# Patient Record
Sex: Male | Born: 1950 | Race: White | Hispanic: No | Marital: Married | Smoking: Former smoker
Health system: Southern US, Community
[De-identification: ages and names within clinical notes are randomized; demographics above are authoritative.]

## PROBLEM LIST (undated history)

## (undated) DIAGNOSIS — M1990 Unspecified osteoarthritis, unspecified site: Secondary | ICD-10-CM

## (undated) DIAGNOSIS — K219 Gastro-esophageal reflux disease without esophagitis: Secondary | ICD-10-CM

## (undated) DIAGNOSIS — R112 Nausea with vomiting, unspecified: Secondary | ICD-10-CM

## (undated) DIAGNOSIS — Z9989 Dependence on other enabling machines and devices: Secondary | ICD-10-CM

## (undated) DIAGNOSIS — G4733 Obstructive sleep apnea (adult) (pediatric): Secondary | ICD-10-CM

## (undated) DIAGNOSIS — F32A Depression, unspecified: Secondary | ICD-10-CM

## (undated) DIAGNOSIS — H409 Unspecified glaucoma: Secondary | ICD-10-CM

## (undated) DIAGNOSIS — F419 Anxiety disorder, unspecified: Secondary | ICD-10-CM

## (undated) DIAGNOSIS — J309 Allergic rhinitis, unspecified: Secondary | ICD-10-CM

## (undated) DIAGNOSIS — E785 Hyperlipidemia, unspecified: Secondary | ICD-10-CM

## (undated) DIAGNOSIS — J189 Pneumonia, unspecified organism: Secondary | ICD-10-CM

## (undated) DIAGNOSIS — I4819 Other persistent atrial fibrillation: Secondary | ICD-10-CM

## (undated) DIAGNOSIS — Z95810 Presence of automatic (implantable) cardiac defibrillator: Secondary | ICD-10-CM

## (undated) DIAGNOSIS — Z87442 Personal history of urinary calculi: Secondary | ICD-10-CM

## (undated) DIAGNOSIS — Z8489 Family history of other specified conditions: Secondary | ICD-10-CM

## (undated) DIAGNOSIS — T4145XA Adverse effect of unspecified anesthetic, initial encounter: Secondary | ICD-10-CM

## (undated) DIAGNOSIS — I119 Hypertensive heart disease without heart failure: Secondary | ICD-10-CM

## (undated) DIAGNOSIS — I509 Heart failure, unspecified: Secondary | ICD-10-CM

## (undated) DIAGNOSIS — K635 Polyp of colon: Secondary | ICD-10-CM

## (undated) DIAGNOSIS — I10 Essential (primary) hypertension: Secondary | ICD-10-CM

## (undated) DIAGNOSIS — Z9889 Other specified postprocedural states: Secondary | ICD-10-CM

## (undated) DIAGNOSIS — T8859XA Other complications of anesthesia, initial encounter: Secondary | ICD-10-CM

## (undated) DIAGNOSIS — N189 Chronic kidney disease, unspecified: Secondary | ICD-10-CM

## (undated) HISTORY — DX: Heart failure, unspecified: I50.9

## (undated) HISTORY — DX: Obstructive sleep apnea (adult) (pediatric): G47.33

## (undated) HISTORY — PX: COLONOSCOPY: SHX174

## (undated) HISTORY — DX: Polyp of colon: K63.5

## (undated) HISTORY — DX: Essential (primary) hypertension: I10

## (undated) HISTORY — DX: Dependence on other enabling machines and devices: Z99.89

## (undated) HISTORY — DX: Other persistent atrial fibrillation: I48.19

## (undated) HISTORY — DX: Unspecified osteoarthritis, unspecified site: M19.90

## (undated) HISTORY — DX: Allergic rhinitis, unspecified: J30.9

## (undated) HISTORY — PX: VASECTOMY: SHX75

## (undated) HISTORY — PX: TONSILLECTOMY: SUR1361

## (undated) HISTORY — DX: Hypertensive heart disease without heart failure: I11.9

## (undated) HISTORY — DX: Hyperlipidemia, unspecified: E78.5

## (undated) HISTORY — DX: Unspecified glaucoma: H40.9

---
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
## 2001-05-01 HISTORY — PX: OTHER SURGICAL HISTORY: SHX169

## 2006-05-01 HISTORY — PX: REPLACEMENT TOTAL KNEE: SUR1224

## 2007-01-15 ENCOUNTER — Inpatient Hospital Stay (HOSPITAL_COMMUNITY): Admission: RE | Admit: 2007-01-15 | Discharge: 2007-01-22 | Payer: Self-pay | Admitting: Orthopaedic Surgery

## 2007-01-18 ENCOUNTER — Ambulatory Visit: Payer: Self-pay | Admitting: Physical Medicine & Rehabilitation

## 2010-09-13 NOTE — Discharge Summary (Signed)
NAMEDONNIS, PHANEUF NO.:  [id number]   MEDICAL RECORD NO.:  [id number]          PATIENT TYPE:  INP   LOCATION:  5029                         FACILITY:  [facility]   PHYSICIAN:  Lubertha Basque. Dalldorf, M.D.DATE OF BIRTH:  1950/09/13   DATE OF ADMISSION:  01/15/2007  DATE OF DISCHARGE:  01/22/2007                               DISCHARGE SUMMARY   ADMITTING DIAGNOSES:  1. 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  2. Hypertension.  3. Hypercholesterolemia.   DISCHARGE DIAGNOSES:  1. 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  2. Hypertension.  3. Hypercholesterolemia.  4. Blood loss anemia   PROCEDURES  Bilateral TKR   BRIEF HISTORY:  Isaiah Jones is a 61 year old white male patient known to  our practice with bilateral knee pain.  He has been having increasing  discomfort in his knees, trouble walking.  X-rays reveal end-stage DJD  of his knees and also a unicompartmental knee replacement on the left  side that has some loosening features.  We have discussed treatment  options with him and he has had a friend that is also a patient of ours  that has had both knees replaced at the same time and this is his wish.  We have talked to him about the risks of anesthesia, infection, DVT and  possible death.   PERTINENT LABORATORY AND X-RAY FINDINGS:  INRs were done serially, as he  was on low-dose Coumadin protocol.  Hemoglobin 9.0, hematocrit 26.2,  WBCs 8.7.  Sodium 136, potassium 4.2, glucose 105, BUN 15 and creatinine  1.10.   Chest x-ray:  No active lung disease.   COURSE IN THE HOSPITAL:  He was admitted postoperatively, was placed on  a variety of p.o. and IM analgesics for pain, IV Ancef a gram q.8 hours  x3 doses was done.  He had physical therapy ordered for out-of-bed  weightbearing as tolerated, knee-high TEDs, incentive spirometry.  He  did have an epidural catheter in for the first two days so  anticoagulation was regulated  with that in mind.  He was started on  Coumadin the day after surgery in the evening and then Lovenox two days  postoperatively; these were regulated by pharmacy.  He was also given IM  and p.o. medications.  He was on a PCA Dilaudid pump and other  antispasmodics, and then his home medicines which will be outlined at  the end of this dictation.  During his hospital stay, his wounds were  noted to be benign, dressings were changed the second day postop and  drains were pulled out.  We did use auto VACs and his blood was  transfused on the first day postoperatively.  He also had followup lab  studies which are inclusive in this chart.  He did have a dip in his  hemoglobin and blood was replaced as necessary; I believe it was two  units of packed RBCs.  He progressed well with physical therapy.  At one  point, he was going a little bit slow and had considered skilled nursing  placement versus rehab unit, but they seemed to turn the  corner and was  able to get up and work with therapy downstairs, and wounds on last  dressing change on his last day of hospitalization were benign.  We had  also arranged for low-dose Coumadin protocol to be regulated by his PCP  doctor in [place] area and also for [place] physical therapy to come  to his house for approximately 30 days and then we were to continue  outpatient.   CONDITION ON DISCHARGE:  Improved.   FOLLOWUP:  1. He will remain on a low sodium, heart-healthy diet.  2. A prescription for Vicodin is given.  3. A prescription for Coumadin dose regulated by pharmacy to be filled      out by pharmacy.  4. Arrangements through [facility] for home physical therapy      was made and through his PCP doctor for regulation of his Coumadin      level.  5. He can change is dressings on his knees daily.  Any sign of      infection including increasing redness, drainage, increasing pain      to call our office at [phone].  We also wanted to see  him back in      the office at the 2 to 2 1/2-week mark from surgery; he will call      that same number for an appointment.   His home medications will remain the same and are as follows:  1. Exforge 5/160 one a day.  2. Crestor 20 mg one a day.  3. Nasonex one spray daily.  4. Vitamin C.  5. Vitamin E.  6. Centrum Silver.  7. Coumadin.  8. Vicodin.      Isaiah Jones, P.A.      Lubertha Basque Jerl Santos, M.D.  Electronically Signed    MC/MEDQ  D:  01/22/2007  T:  01/22/2007  Job:  [id number]

## 2010-09-13 NOTE — Op Note (Signed)
NAMEACEL, NATZKE NO.:  [id number]   MEDICAL RECORD NO.:  [id number]          PATIENT TYPE:  INP   LOCATION:  2899                         FACILITY:  [facility]   PHYSICIAN:  Lubertha Basque. Dalldorf, M.D.DATE OF BIRTH:  [birth year]   DATE OF PROCEDURE:  01/15/2007  DATE OF DISCHARGE:                               OPERATIVE REPORT   PREOPERATIVE DIAGNOSES:  Bilateral knee pain status post prior  procedures.   POSTOPERATIVE DIAGNOSES:  Bilateral knee pain status post prior  procedures.   PROCEDURES:  Bilateral revision knee replacement surgery.   ANESTHESIA:  Epidural and MAC.   ATTENDING SURGEON:  Dr. Jerl Santos.   ASSISTANT:  Lindwood Qua, PA.   INDICATIONS FOR PROCEDURE:  The patient is a 60 year old male with a  long history of bilateral degenerative arthritis of the knees. He has  failed many oral anti-inflammatories as well as injectables. He has had  surgery on both knees.  On the left, he underwent a unicompartmental  knee replacement several years ago which has remained painful. On the  right, he had a high tibial osteotomy which did work for a while but has  become painful at this time.  His pain limits his ability to walk and  stand and work and rest and he is offered revision surgeries.  We  encouraged him to have one done at a time but after much discussion, we  have agreed to go forward with bilateral surgery.  Informed operative  consent was obtained after discussion of the possible complications of,  reaction to anesthesia, infection, DVT, PE, and death. The patient also  understood that there was an increased risk of very severe complications  with the bilateral surgery.   SUMMARY OF FINDINGS AND PROCEDURE:  Under epidural and MAC, both knees  were addressed.  On the left, we removed his old unicompartmental knee  replacement and then did a revision knee replacement.  On the right, we  addressed his valgus configuration due to the high tibial  osteotomy and  performed a knee replacement on this side.  On both sides we used the  size 5 MBT stemmed Depuy trays on the tibia and on both sides we used  large plus femurs. On both sides, we used 38 all polyethylene patellae  and on the right we used a size 10 deep dish spacer, on the left this  was a size 12.5.  Bryna Colander assisted throughout and was invaluable  to the completion of the cases in that he helped position and retract  while I performed the procedures.  He also closed simultaneously to help  minimize OR time.   DESCRIPTION OF PROCEDURE:  The patient went to the operating suite where  epidural was applied.  He was also given some sedation. He was  positioned supine, prepped and draped in normal sterile fashion.  After  administration of 2 grams of IV Kefzol, the left leg was elevated,  exsanguinated, and a tourniquet inflated about the thigh.  I made an  incision which was not midline but more on the medial aspect in the  bottom  half consisting of his old unicompartmental incision.  Dissection  was carried down to the extensor mechanism.  A medial parapatellar  incision was made.  The kneecap was flipped and the knee flexed.  The  tibial component appeared to be a bit loose but the femoral component  was well fixed. We used osteotomes to removed both of these components.  I then removed the residual meniscal tissues and ACL and PCL.  He did  have degenerative change lateral as well and his bone quality was  excellent.  I placed an intramedullary guide in the  tibia to make a cut  with a very slight posterior tilt.  This was a lower than normal cut due  to the previous unicompartmental knee replacement.  I then placed an  intramedullary guide in the femur to make anterior posterior cuts  creating a flexion gap of 12.5 mm.  I placed a second intramedullary  guide in the femur to make a distal cut creating an equal extension gap  of 12.5 mm.  The femur sized to a large  plus and the tibia to a 5 and  the appropriate guides were placed and utilized.  We did drill for a  stem on the tibia as this was a revision procedure and the patient was  quite large in stature.  The patella was cut down in thickness by 10 mm  to 16 and sized to a 38 with the appropriate guide placed and utilized.  Pulsatile lavage was used to cleanse all cut bony surfaces.  We mixed  the cement including 1.5 grams of Zinacef.  This was then pressurized  onto all the bony surfaces followed by placement of the aforementioned  DePuy LCS components with the lone exception being the MBT stemmed  tibial tray.  Excess cement was trimmed and pressure was held on the  components until the cement had hardened.  The tourniquet was deflated  and a small amount of bleeding was easily controlled with Bovie cautery.  A drain was placed exiting superolaterally.  The extensor mechanism was  reapproximated with #1 Vicryl in interrupted fashion followed by  subcutaneous reapproximation #0 and 2-0 undyed Vicryl and skin closure  with staples.  The knee easily flexed to 120 against gravity at the end  of the case.  Adaptic was applied followed by dry gauze and a loose Ace  wrap.  During closure, we turned our attention to the right knee.  This  leg was elevated, exsanguinated and tourniquet inflated about the thigh.  We made a more standard midline incision with dissection down to the  extensor mechanism.  A medial patellar incision was made and the kneecap  was flipped and the knee flexed.  The residual meniscal tissues were  removed along with the ACL and PCL.  He had advanced degenerative  changes both lateral and medial and a significant valgus deformity.  Bone quality was excellent.  I used the same system with corrective cuts  and soft tissue releases to address the valgus deformity.  We had some  difficulty finding the tibial canal due to the old high tibial osteotomy  but that this not much of an  obstacle. The same components were used on  this knee and again we included antibiotic in the cement.  The only  differences was on this side we used a size 10 spacer as we did not have  make the low tibial cut as on the opposite side where he had the  unicompartmental knee in place.  This knee was closed in a similar  fashion.  Estimated blood loss for the total of the two cases was 100 mL  and intraoperative fluids obtained from anesthesia records.   DISPOSITION:  The patient was taken to recovery in stable addition.  He  was to be admitted for the orthopedic surgery service for appropriate  postop care to include epidural for 2 days and perioperative antibiotic.  We will start him with PAS hose and TED hose today and will start his  Coumadin tomorrow.  We will start Lovenox likely in 2 days when the  epidural is removed.      Lubertha Basque Jerl Santos, M.D.  Electronically Signed     PGD/MEDQ  D:  01/15/2007  T:  01/15/2007  Job:  [id number]

## 2011-02-09 LAB — CBC
HCT: 22.9 — ABNORMAL LOW
HCT: 27.6 — ABNORMAL LOW
HCT: 30.9 — ABNORMAL LOW
Hemoglobin: 10.8 — ABNORMAL LOW
Hemoglobin: 7.8 — CL
Hemoglobin: 8.8 — ABNORMAL LOW
MCHC: 33.9
MCHC: 34.3
MCHC: 34.6
MCHC: 35
MCV: 94.6
Platelets: 161
Platelets: 202
Platelets: 232
Platelets: 307
Platelets: 367
RBC: 2.69 — ABNORMAL LOW
RBC: 3.31 — ABNORMAL LOW
RDW: 13.1
RDW: 13.4
RDW: 13.8
RDW: 14.1 — ABNORMAL HIGH
RDW: 14.3 — ABNORMAL HIGH
WBC: 10.4
WBC: 10.6 — ABNORMAL HIGH
WBC: 8.5

## 2011-02-09 LAB — BASIC METABOLIC PANEL
BUN: 11
Calcium: 8.6
Calcium: 8.7
Creatinine, Ser: 0.9
Creatinine, Ser: 1.1
GFR calc Af Amer: >60
GFR calc non Af Amer: >60
GFR calc non Af Amer: >60
Glucose, Bld: 113 — ABNORMAL HIGH
Potassium: 4.3
Sodium: 136

## 2011-02-09 LAB — COMPREHENSIVE METABOLIC PANEL
Alkaline Phosphatase: 50
BUN: 17
CO2: 28
GFR calc non Af Amer: >60
Glucose, Bld: 152 — ABNORMAL HIGH
Potassium: 4.1
Total Protein: 4.8 — ABNORMAL LOW

## 2011-02-09 LAB — CROSSMATCH
ABO/RH(D): O NEG
Antibody Screen: NEGATIVE

## 2011-02-09 LAB — PROTIME-INR
INR: 1.2
INR: 1.3
INR: 1.7 — ABNORMAL HIGH
INR: 2.2 — ABNORMAL HIGH
INR: 2.2 — ABNORMAL HIGH
INR: 2.4 — ABNORMAL HIGH
INR: 3.3 — ABNORMAL HIGH
Prothrombin Time: 15.6 — ABNORMAL HIGH
Prothrombin Time: 16.1 — ABNORMAL HIGH
Prothrombin Time: 20 — ABNORMAL HIGH
Prothrombin Time: 25.5 — ABNORMAL HIGH
Prothrombin Time: 27 — ABNORMAL HIGH
Prothrombin Time: 34.8 — ABNORMAL HIGH

## 2011-02-10 LAB — BASIC METABOLIC PANEL
CO2: 30
Calcium: 10
Creatinine, Ser: 0.93
GFR calc Af Amer: >60
GFR calc non Af Amer: >60
Sodium: 139

## 2011-02-10 LAB — CBC
MCHC: 34.7
RBC: 4.44
WBC: 7.2

## 2015-05-02 DIAGNOSIS — I499 Cardiac arrhythmia, unspecified: Secondary | ICD-10-CM

## 2015-05-02 HISTORY — DX: Cardiac arrhythmia, unspecified: I49.9

## 2015-12-08 DIAGNOSIS — C61 Malignant neoplasm of prostate: Secondary | ICD-10-CM | POA: Insufficient documentation

## 2016-07-10 NOTE — Progress Notes (Signed)
Cardiology Office Note:    Date:  07/11/2016   ID:  Isaiah Jones, DOB July 21, 1950, MRN [id number]  PCP:  Quentin Cornwall, MD  Cardiologist:  New - Dr. Casandra Doffing   Electrophysiologist:  n/a Ortho: Dr. Rhona Raider Pulmonologist: Dr. Maggie Schwalbe  Referring MD: Quentin Cornwall, MD   Chief Complaint  Patient presents with  . Atrial Fibrillation    History of Present Illness:    Isaiah Jones is a 66 y.o. male with a hx of HTN, HL, Obesity, OSA on CPAP.  He is referred by his PCP for new onset AFib.  He was noted to have an irregular heart beat at his FU with his Pulmonologist.  ECG at his PCPs office confirmed atrial fibrillation.  Since then, [name] thinks he has felt palpitations.  He admits to feeling fatigued and having more shortness of breath with exertion over the past 2-3 mos.  He denies chest pain.  He does admit to acid reflux symptoms with lying flat assoc with certain meals.  He denies syncope, orthopnea, PND, edema.  He denies any significant bleeding hx.   CHADS2-VASc=2 (age 56, HTN).  PAD Screen 07/11/2016  Previous PAD dx? No  Previous surgical procedure? No  Pain with walking? No  Feet/toe relief with dangling? No  Painful, non-healing ulcers? No  Extremities discolored? No    Prior CV studies:   The following studies were reviewed today:  None   Past Medical History:  Diagnosis Date  . Arthritis, degenerative   . 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 Colon polyps   . Dyslipidemia   . OSA on CPAP   . Rhinitis, allergic     Past Surgical History:  Procedure Laterality Date  . arthroscopic knee surgery Bilateral 2003  . REPLACEMENT TOTAL KNEE Bilateral 2008  . TONSILLECTOMY    . VASECTOMY      Current Medications: Current Meds  Medication Sig  . Amlodipine-Valsartan-HCTZ 5-160-12.5 MG TABS Take 1 tablet by mouth daily.   . fexofenadine (ALLEGRA) 180 MG tablet Take 180 mg by mouth daily as needed for allergies  or rhinitis.  . Ginkgo Biloba (GINKOBA) 40 MG TABS Take 40 mg by mouth daily.  Marland Kitchen ibuprofen (ADVIL,MOTRIN) 200 MG tablet Take 200 mg by mouth every 6 (six) hours as needed for moderate pain.   . mometasone (NASONEX) 50 MCG/ACT nasal spray Place 2 sprays into the nose daily.  . Multiple Vitamin (MULTI-DAY PO) Take 1 tablet by mouth daily.   . Multiple Vitamins-Minerals (AIRBORNE PO) Take 1 tablet by mouth daily.   . OMEGA-3 KRILL OIL PO Take 1 tablet by mouth daily.   . rosuvastatin (CRESTOR) 20 MG tablet Take 20 mg by mouth daily.  . tadalafil (CIALIS) 20 MG tablet Take 20 mg by mouth daily as needed for erectile dysfunction.  . thiamine (VITAMIN B-1) 100 MG tablet Take 100 mg by mouth daily.     Allergies:   Patient has no known allergies.   Social History   Social History  . Marital status: Married    Spouse name: N/A  . Number of children: N/A  . Years of education: N/A   Social History Main Topics  . Smoking status: Former Smoker    Types: Cigarettes    Quit date: 07/04/2001  . Smokeless tobacco: Never Used  . Alcohol use Yes     Comment: OCCASIONALLY  . Drug use: No  . Sexual activity: Yes  Comment: MARRIED   Other Topics Concern  . None   Social History Narrative   Lives in [place], [place]   Married   2 kids   Retired - Prior worked at [employer] History  Problem Relation Age of Onset  . Alzheimer's disease Mother   . Heart attack Mother   . Heart attack Father 67  . Hypertension Brother   . Other Brother     [place]  . Atrial fibrillation Neg Hx      ROS:   Please see the history of present illness.    Review of Systems  Constitution: Positive for diaphoresis, malaise/fatigue and weight gain.  Cardiovascular: Positive for dyspnea on exertion and irregular heartbeat.  Musculoskeletal: Positive for back pain.  Psychiatric/Behavioral: The patient is nervous/anxious.    All other systems reviewed and are negative.   EKGs/Labs/Other Test  Reviewed:    EKG:  EKG is  ordered today.  The ekg ordered today demonstrates AF, HR 102, ant Q waves, PVCs ECG from PCP dated 06/28/16: Atrial fibrillation, HR 78, normal axis, PVC, anteroseptal Q waves  Recent Labs: No results found for requested labs within last 8760 hours.  Labs from PCP 06/28/16: Hgb 16.5, K 4.5, creatinine 0.8, ALT 37, TC 123, HDL 38, TG 188, LDL 54  Recent Lipid Panel No results found for: CHOL, TRIG, HDL, CHOLHDL, VLDL, LDLCALC, LDLDIRECT   Physical Exam:    VS:  BP (!) 160/72 (BP Location: Left Arm, Patient Position: Sitting, Cuff Size: Large)   Pulse 99   Ht 6\' 2"  (1.88 m)   Wt (!) 377 lb 1.9 oz (171.1 kg)   BMI 48.42 kg/m     Wt Readings from Last 3 Encounters:  07/11/16 (!) 377 lb 1.9 oz (171.1 kg)     Physical Exam  Constitutional: He is oriented to person, place, and time. He appears well-developed and well-nourished. No distress.  HENT:  Head: Normocephalic and atraumatic.  Eyes: No scleral icterus.  Neck: Normal range of motion. No JVD present. Carotid bruit is not present.  Cardiovascular: Normal rate, S1 normal and S2 normal.  An irregularly irregular rhythm present.  No murmur heard. Pulmonary/Chest: Effort normal and breath sounds normal. He has no wheezes. He has no rhonchi. He has no rales.  Abdominal: Soft. There is no tenderness.  Musculoskeletal: He exhibits no edema.  Neurological: He is alert and oriented to person, place, and time.  Skin: Skin is warm and dry.  Psychiatric: He has a normal mood and affect.    ASSESSMENT:    1. Persistent atrial fibrillation ([place])   2. Hypertensive heart disease without CHF   3. Hyperlipidemia, unspecified hyperlipidemia type   4. Morbid obesity ([place])   5. OSA (obstructive sleep apnea)    PLAN:    In order of problems listed above:  1. Persistent atrial fibrillation ([place]) -  He presents for evaluation of atrial fibrillation.  Obesity has likely contributed to his obstructive sleep apnea.   HTN and OSA have likely contributed to his atrial fibrillation.  I reviewed the physiology of atrial fibrillation with him and his wife today. We also discussed the rationale for anticoagulation as well as the strategies of rate versus rhythm control.  His HR is s/w elevated today.  His BP is also above target.  He notes that it has been higher recently.  He seems to be symptomatic with dyspnea on exertion and fatigue.  His stroke risk is high enough  that he should be on anticoagulation long term.    -  Start Toprol-XL 50 mg QD  -  Start Xarelto 20 mg QD  -  Check TSH  -  Check Echo  -  FU in 3-4 weeks.  Plan DCCV if he remains in AFib.  2. Hypertensive heart disease without CHF -  BP above target.  Continue Exforge.  Start Toprol-XL as noted above.  3. Hyperlipidemia, unspecified hyperlipidemia type -  Managed by PCP.  Continue statin.   4. Morbid obesity ([place]) -  He is committed to losing weight.  He plans to start Weight Watchers soon and would like to pursue exercise.  I advised him to do low level activity for now (i.e. Walking).    5. OSA (obstructive sleep apnea) - Continue CPAP and FU with Pulmonology.  Total time spent with patient today 45 minutes. This includes reviewing records, evaluating the patient and coordinating care. Face-to-face time >50%.    Dispo:  Return in about 4 weeks (around 08/08/2016) for Close Follow Up, w/ Richardson Dopp, PA-C or Dr. Irish Lack .   Medication Adjustments/Labs and Tests Ordered: Current medicines are reviewed at length with the patient today.  Concerns regarding medicines are outlined above.  Medication changes, Labs and Tests ordered today are outlined in the Patient Instructions noted below. Patient Instructions  Medication Instructions:  Your physician has recommended you make the following change in your medication: Start taking Toprol XL 50 mg once a day also start taking Xarelto 20 mg once a day at supper time.   Labwork: Your physician  recommends that you have a TSH drawn today.   Testing/Procedures: Your physician has requested that you have an echocardiogram. Echocardiography is a painless test that uses sound waves to create images of your heart. It provides your doctor with information about the size and shape of your heart and how well your heart's chambers and valves are working. This procedure takes approximately one hour. There are no restrictions for this procedure.  Follow-Up: Your physician recommends that you schedule a follow-up appointment in: Dr. Irish Lack in 3-4 weeks if not available then with Richardson Dopp PA-C same day Dr. Irish Lack is in the office.  Any Other Special Instructions Will Be Listed Below (If Applicable).  If you need a refill on your cardiac medications before your next appointment, please call your pharmacy.  Signed, Richardson Dopp, PA-C  07/11/2016 11:41 AM    [facility] [address], [place], [place]  [address] Phone: [phone]; Fax: [phone]   I have examined the patient and reviewed assessment and plan and discussed with patient.  Agree with above as stated.  New onset atrial fibrillation.  Anticoagulation to reduce stroke risk.  Plan for DCCV in 3-4 weeks after uninterrupted anticoagulation.  He would benefit from weight loss.  COnsider sleep study at some point in the future as well.    Larae Grooms

## 2016-07-11 ENCOUNTER — Encounter (INDEPENDENT_AMBULATORY_CARE_PROVIDER_SITE_OTHER): Payer: Self-pay

## 2016-07-11 ENCOUNTER — Ambulatory Visit: Payer: Self-pay | Admitting: Physician Assistant

## 2016-07-11 ENCOUNTER — Ambulatory Visit (HOSPITAL_COMMUNITY)
Admission: RE | Admit: 2016-07-11 | Discharge: 2016-07-11 | Disposition: A | Discharge status: Home / Self Care | Payer: Medicare Other | Source: Ambulatory Visit | Attending: Physician Assistant | Admitting: Physician Assistant

## 2016-07-11 ENCOUNTER — Encounter: Payer: Self-pay | Admitting: Physician Assistant

## 2016-07-11 ENCOUNTER — Telehealth: Payer: Self-pay | Admitting: *Deleted

## 2016-07-11 ENCOUNTER — Ambulatory Visit (INDEPENDENT_AMBULATORY_CARE_PROVIDER_SITE_OTHER): Payer: Medicare Other | Admitting: Physician Assistant

## 2016-07-11 VITALS — BP 160/72 | HR 99 | Ht 74.0 in | Wt 377.1 lb

## 2016-07-11 DIAGNOSIS — I481 Persistent atrial fibrillation: Secondary | ICD-10-CM | POA: Diagnosis present

## 2016-07-11 DIAGNOSIS — E785 Hyperlipidemia, unspecified: Secondary | ICD-10-CM

## 2016-07-11 DIAGNOSIS — I4819 Other persistent atrial fibrillation: Secondary | ICD-10-CM | POA: Insufficient documentation

## 2016-07-11 DIAGNOSIS — G4733 Obstructive sleep apnea (adult) (pediatric): Secondary | ICD-10-CM

## 2016-07-11 DIAGNOSIS — I119 Hypertensive heart disease without heart failure: Secondary | ICD-10-CM

## 2016-07-11 DIAGNOSIS — Z9989 Dependence on other enabling machines and devices: Secondary | ICD-10-CM

## 2016-07-11 LAB — ECHOCARDIOGRAM COMPLETE
FS: 33 % (ref 28–44)
HEIGHTINCHES: 74 in
IVS/LV PW RATIO, ED: 0.78
LA ID, A-P, ES: 48 mm
LA diam end sys: 48 mm
LA diam index: 1.68 cm/m2
LA vol A4C: 110 ml
LA vol index: 40.4 mL/m2
LAVOL: 115 mL
LDCA: 4.15 cm2
LVOT diameter: 23 mm
PW: 14.9 mm — AB (ref 0.6–1.1)
Weight: 6033.92 oz

## 2016-07-11 MED ORDER — METOPROLOL SUCCINATE ER 50 MG PO TB24: 50.0000 mg | ORAL_TABLET | Freq: Every day | ORAL | 11 refills | Status: DC

## 2016-07-11 MED ORDER — RIVAROXABAN 20 MG PO TABS: 20.0000 mg | ORAL_TABLET | Freq: Every day | ORAL | 11 refills | Status: DC

## 2016-07-11 NOTE — Patient Instructions (Addendum)
Medication Instructions:  Your physician has recommended you make the following change in your medication: Start taking Toprol XL 50 mg once a day also start taking Xarelto 20 mg once a day at supper time.   Labwork: Your physician recommends that you have a TSH drawn today.   Testing/Procedures: Your physician has requested that you have an echocardiogram. Echocardiography is a painless test that uses sound waves to create images of your heart. It provides your doctor with information about the size and shape of your heart and how well your heart's chambers and valves are working. This procedure takes approximately one hour. There are no restrictions for this procedure.  Follow-Up: Your physician recommends that you schedule a follow-up appointment in: Dr. Irish Lack in 3-4 weeks if not available then with Richardson Dopp PA-C same day Dr. Irish Lack is in the office.  Any Other Special Instructions Will Be Listed Below (If Applicable).  If you need a refill on your cardiac medications before your next appointment, please call your pharmacy.

## 2016-07-11 NOTE — Progress Notes (Signed)
  Echocardiogram 2D Echocardiogram has been performed.  Isaiah Jones 07/11/2016, 2:58 PM

## 2016-07-11 NOTE — Telephone Encounter (Signed)
Lmtcb to go over echo results.  

## 2016-07-12 ENCOUNTER — Telehealth: Payer: Self-pay | Admitting: *Deleted

## 2016-07-12 LAB — TSH: TSH: 1.59 u[IU]/mL (ref 0.450–4.500)

## 2016-07-12 NOTE — Telephone Encounter (Signed)
DPR ok to lmom. Lmom lab work normal. Continue current Tx plan. Any questions call [phone].

## 2016-07-12 NOTE — Telephone Encounter (Signed)
Pt notified of echo results and findings by phone with verbal understanding. Pt verified his 4/10 appt with Dr. Irish Lack.

## 2016-07-12 NOTE — Telephone Encounter (Signed)
Follow Up:    Returning your call from yesterday,concerning echo results.

## 2016-07-26 ENCOUNTER — Encounter: Payer: Self-pay | Admitting: Interventional Cardiology

## 2016-08-07 NOTE — Progress Notes (Signed)
Cardiology Office Note   Date:  08/08/2016   ID:  Isaiah Jones, DOB 20-Jul-1950, MRN [id number]  PCP:  Quentin Cornwall, MD    No chief complaint on file.    Wt Readings from Last 3 Encounters:  08/08/16 (!) 366 lb (166 kg)  07/11/16 (!) 377 lb 1.9 oz (171.1 kg)       History of Present Illness: Isaiah Jones is a 66 y.o. male  with history of hyperlipidemia, hypertension and obesity. He also is treated for sleep apnea with C. He was seen in our office back in March 2018 due to new onset atrial fibrillation. His pulmonologist found the irregular heartbeat. Atrial fibrillation was diagnosed. He has had some fatigue and intermittent palpitations over the past few months prior to diagnosis.  At his initial visit in our office in March 2018, he was started on Toprol-XL 50 mg daily for rate control, Xarelto 20 mg daily for stroke prevention. Cardioversion was considered as well. He was advised to lose weight.  Echocardiogram from March 2018 revealed: - Left ventricle: The cavity size was normal. Wall thickness was   increased in a pattern of mild LVH. Systolic function was normal.   The estimated ejection fraction was in the range of 55% to 60%.   Wall motion was normal; there were no regional wall motion   abnormalities. Doppler parameters are consistent with abnormal   left ventricular relaxation (grade 1 diastolic dysfunction). - Left atrium: The atrium was mildly dilated. Volume/bsa, S: 37.4   ml/m^2.  He walks at home and notices that his HR increases with activity to the 120s.   No palpitations.  No bleeding problems.  He feels colder than he did before.       Past Medical History:  Diagnosis Date  . Arthritis, degenerative   . 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 Colon polyps   . Dyslipidemia   . OSA on CPAP   . Persistent atrial fibrillation ([place])    Echo 3/18: Mild LVH, EF 55-60, normal wall motion, grade 1 diastolic  dysfunction, mild LAE  . Rhinitis, allergic     Past Surgical History:  Procedure Laterality Date  . arthroscopic knee surgery Bilateral 2003  . REPLACEMENT TOTAL KNEE Bilateral 2008  . TONSILLECTOMY    . VASECTOMY       Current Outpatient Prescriptions  Medication Sig Dispense Refill  . acetaminophen (TYLENOL) 500 MG tablet Take 500 mg by mouth every 6 (six) hours as needed.    . Amlodipine-Valsartan-HCTZ 5-160-12.5 MG TABS Take 1 tablet by mouth daily.     . diphenhydramine-acetaminophen (TYLENOL PM) 25-500 MG TABS tablet Take 1 tablet by mouth at bedtime as needed.    . fexofenadine (ALLEGRA) 180 MG tablet Take 180 mg by mouth daily as needed for allergies or rhinitis.    . Ginkgo Biloba (GINKOBA) 40 MG TABS Take 40 mg by mouth daily.    . metoprolol succinate (TOPROL-XL) 50 MG 24 hr tablet Take 1 tablet (50 mg total) by mouth daily. Take with or immediately following a meal. 30 tablet 11  . mometasone (NASONEX) 50 MCG/ACT nasal spray Place 2 sprays into the nose daily.    . Multiple Vitamin (MULTI-DAY PO) Take 1 tablet by mouth daily.     . Multiple Vitamins-Minerals (AIRBORNE PO) Take 1 tablet by mouth daily.     . OMEGA-3 KRILL OIL PO Take 1 tablet by mouth daily.     Marland Kitchen  rivaroxaban (XARELTO) 20 MG TABS tablet Take 1 tablet (20 mg total) by mouth daily with supper. 30 tablet 11  . rosuvastatin (CRESTOR) 20 MG tablet Take 20 mg by mouth daily.    . sertraline (ZOLOFT) 50 MG tablet Take 50 mg by mouth daily.     . tadalafil (CIALIS) 20 MG tablet Take 20 mg by mouth daily as needed for erectile dysfunction.    . thiamine (VITAMIN B-1) 100 MG tablet Take 100 mg by mouth daily.     No current facility-administered medications for this visit.     Allergies:   Patient has no known allergies.    Social History:  The patient  reports that he quit smoking about 15 years ago. His smoking use included Cigarettes. He has never used smokeless tobacco. He reports that he drinks alcohol. He  reports that he does not use drugs.   Family History:  The patient's family history includes Alzheimer's disease in his mother; Heart attack in his mother; Heart attack (age of onset: 68) in his father; Hypertension in his brother; Other in his brother.    ROS:  Please see the history of present illness.   Otherwise, review of systems are positive for increased headaches- sinus.   All other systems are reviewed and negative.    PHYSICAL EXAM: VS:  BP 122/82   Pulse 65   Ht 6\' 2"  (1.88 m)   Wt (!) 366 lb (166 kg)   BMI 46.99 kg/m  , BMI Body mass index is 46.99 kg/m. GEN: Well nourished, well developed, in no acute distress  HEENT: normal  Neck: no JVD, carotid bruits, or masses Cardiac: irregularly irregular; no murmurs, rubs, or gallops,no edema  Respiratory:  clear to auscultation bilaterally, normal work of breathing GI: soft, nontender, nondistended, + BS MS: no deformity or atrophy  Skin: warm and dry, no rash Neuro:  Strength and sensation are intact Psych: euthymic mood, full affect   EKG:   The ekg ordered today demonstrates AFib, PVC, rate controlled, nonspecific ST segment changes   Recent Labs: 07/11/2016: TSH 1.590   Lipid Panel No results found for: CHOL, TRIG, HDL, CHOLHDL, VLDL, LDLCALC, LDLDIRECT   Other studies Reviewed: Additional studies/ records that were reviewed today with results demonstrating: echo as noted above; TSH normal in 3/18.   ASSESSMENT AND PLAN:  1. Atrial fibrillation:  Continue Toprol for rate control and Xarelto for stroke prevention. The risks and benefits of cardioversion were explained to the patient and he is willing to proceed. All questions were answered. I stressed the importance of being compliant with his Xarelto.  2. Hypertensive heart disease: Blood pressure well controlled. Continue current medications.  3. Obesity: He is more motivated to lose weight. He is considering joining [employer].  4. OSA:  Continue CPAP.  I  stressed the importance of compliance with C Pap in order to help prevent recurrence of atrial fibrillation after cardioversion.  5. Hyperlipidemia: Continue rosuvastatin. Lipids followed by primary care physician.   Current medicines are reviewed at length with the patient today.  The patient concerns regarding his medicines were addressed.  The following changes have been made:  No change  Labs/ tests ordered today include:  No orders of the defined types were placed in this encounter.   Recommend 150 minutes/week of aerobic exercise Low fat, low carb, high fiber diet recommended  Disposition:   FU in post cardioversion   Signed, Larae Grooms, MD  08/08/2016 8:10 AM  [facility] [address], [place], [place]  [address] Phone: [phone]; Fax: [phone]

## 2016-08-08 ENCOUNTER — Ambulatory Visit (INDEPENDENT_AMBULATORY_CARE_PROVIDER_SITE_OTHER): Payer: Medicare Other | Admitting: Interventional Cardiology

## 2016-08-08 ENCOUNTER — Encounter: Payer: Self-pay | Admitting: Interventional Cardiology

## 2016-08-08 ENCOUNTER — Encounter (INDEPENDENT_AMBULATORY_CARE_PROVIDER_SITE_OTHER): Payer: Self-pay

## 2016-08-08 VITALS — BP 122/82 | HR 65 | Ht 74.0 in | Wt 366.0 lb

## 2016-08-08 DIAGNOSIS — E785 Hyperlipidemia, unspecified: Secondary | ICD-10-CM | POA: Diagnosis not present

## 2016-08-08 DIAGNOSIS — Z9989 Dependence on other enabling machines and devices: Secondary | ICD-10-CM

## 2016-08-08 DIAGNOSIS — I481 Persistent atrial fibrillation: Secondary | ICD-10-CM | POA: Diagnosis not present

## 2016-08-08 DIAGNOSIS — I119 Hypertensive heart disease without heart failure: Secondary | ICD-10-CM

## 2016-08-08 DIAGNOSIS — G4733 Obstructive sleep apnea (adult) (pediatric): Secondary | ICD-10-CM

## 2016-08-08 DIAGNOSIS — I4819 Other persistent atrial fibrillation: Secondary | ICD-10-CM

## 2016-08-08 LAB — BASIC METABOLIC PANEL
BUN/Creatinine Ratio: 19 (ref 10–24)
BUN: 18 mg/dL (ref 8–27)
CALCIUM: 10.1 mg/dL (ref 8.6–10.2)
CHLORIDE: 98 mmol/L (ref 96–106)
CO2: 26 mmol/L (ref 18–29)
Creatinine, Ser: 0.97 mg/dL (ref 0.76–1.27)
GFR calc non Af Amer: 81 mL/min/{1.73_m2} (ref >59)
GFR, EST AFRICAN AMERICAN: 94 mL/min/{1.73_m2} (ref >59)
GLUCOSE: 102 mg/dL — AB (ref 65–99)
POTASSIUM: 4.9 mmol/L (ref 3.5–5.2)
Sodium: 141 mmol/L (ref 134–144)

## 2016-08-08 LAB — CBC
Hematocrit: 49.1 % (ref 37.5–51.0)
Hemoglobin: 16.9 g/dL (ref 13.0–17.7)
MCH: 32.1 pg (ref 26.6–33.0)
MCHC: 34.4 g/dL (ref 31.5–35.7)
MCV: 93 fL (ref 79–97)
PLATELETS: 194 10*3/uL (ref 150–379)
RBC: 5.27 x10E6/uL (ref 4.14–5.80)
RDW: 13.6 % (ref 12.3–15.4)
WBC: 6.3 10*3/uL (ref 3.4–10.8)

## 2016-08-08 LAB — PROTIME-INR
INR: 1.3 — ABNORMAL HIGH (ref 0.8–1.2)
Prothrombin Time: 13.9 s — ABNORMAL HIGH (ref 9.1–12.0)

## 2016-08-08 MED ORDER — METOPROLOL SUCCINATE ER 50 MG PO TB24: 50.0000 mg | ORAL_TABLET | Freq: Every day | ORAL | 3 refills | Status: DC

## 2016-08-08 MED ORDER — RIVAROXABAN 20 MG PO TABS
20.0000 mg | ORAL_TABLET | Freq: Every day | ORAL | 3 refills | Status: DC
Start: 2016-08-08 — End: 2017-02-26

## 2016-08-08 NOTE — Patient Instructions (Signed)
Medication Instructions:  Your physician recommends that you continue on your current medications as directed. Please refer to the Current Medication list given to you today.   Labwork: LABS TODAY: BMET, CBC, PT/INR  Testing/Procedures: Your physician has recommended that you have a Cardioversion (DCCV). Electrical Cardioversion uses a jolt of electricity to your heart either through paddles or wired patches attached to your chest. This is a controlled, usually prescheduled, procedure. Defibrillation is done under light anesthesia in the hospital, and you usually go home the day of the procedure. This is done to get your heart back into a normal rhythm. You are not awake for the procedure. Please see the instruction sheet given to you today.    Follow-Up: Your physician recommends that you schedule a follow-up appointment in: 2 weeks with Dr. Irish Lack.   Any Other Special Instructions Will Be Listed Below (If Applicable).   Electrical Cardioversion Electrical cardioversion is the delivery of a jolt of electricity to restore a normal rhythm to the heart. A rhythm that is too fast or is not regular keeps the heart from pumping well. In this procedure, sticky patches or metal paddles are placed on the chest to deliver electricity to the heart from a device. This procedure may be done in an emergency if:  There is low or no blood pressure as a result of the heart rhythm.  Normal rhythm must be restored as fast as possible to protect the brain and heart from further damage.  It may save a life. This procedure may also be done for irregular or fast heart rhythms that are not immediately life-threatening. Tell a health care provider about:  Any allergies you have.  All medicines you are taking, including vitamins, herbs, eye drops, creams, and over-the-counter medicines.  Any problems you or family members have had with anesthetic medicines.  Any blood disorders you have.  Any  surgeries you have had.  Any medical conditions you have.  Whether you are pregnant or may be pregnant. What are the risks? Generally, this is a safe procedure. However, problems may occur, including:  Allergic reactions to medicines.  A blood clot that breaks free and travels to other parts of your body.  The possible return of an abnormal heart rhythm within hours or days after the procedure.  Your heart stopping (cardiac arrest). This is rare. What happens before the procedure? Medicines   Your health care provider may have you start taking:  Blood-thinning medicines (anticoagulants) so your blood does not clot as easily.  Medicines may be given to help stabilize your heart rate and rhythm.  Ask your health care provider about changing or stopping your regular medicines. This is especially important if you are taking diabetes medicines or blood thinners. General instructions   Plan to have someone take you home from the hospital or clinic.  If you will be going home right after the procedure, plan to have someone with you for 24 hours.  Follow instructions from your health care provider about eating or drinking restrictions. What happens during the procedure?  To lower your risk of infection:  Your health care team will wash or sanitize their hands.  Your skin will be washed with soap.  An IV tube will be inserted into one of your veins.  You will be given a medicine to help you relax (sedative).  Sticky patches (electrodes) or metal paddles may be placed on your chest.  An electrical shock will be delivered. The procedure may vary among  health care providers and hospitals. What happens after the procedure?  Your blood pressure, heart rate, breathing rate, and blood oxygen level will be monitored until the medicines you were given have worn off.  Do not drive for 24 hours if you were given a sedative.  Your heart rhythm will be watched to make sure it does  not change. This information is not intended to replace advice given to you by your health care provider. Make sure you discuss any questions you have with your health care provider. Document Released: 04/07/2002 Document Revised: 12/15/2015 Document Reviewed: 10/22/2015 Elsevier Interactive Patient Education  2017 [employer].    If you need a refill on your cardiac medications before your next appointment, please call your pharmacy.

## 2016-08-09 ENCOUNTER — Other Ambulatory Visit: Payer: Self-pay | Admitting: Interventional Cardiology

## 2016-08-09 DIAGNOSIS — I4819 Other persistent atrial fibrillation: Secondary | ICD-10-CM

## 2016-08-10 ENCOUNTER — Encounter (HOSPITAL_COMMUNITY)
Admission: RE | Disposition: A | Discharge status: Home / Self Care | Payer: Self-pay | Source: Ambulatory Visit | Attending: Internal Medicine

## 2016-08-10 ENCOUNTER — Ambulatory Visit (HOSPITAL_COMMUNITY): Payer: Medicare Other | Admitting: Anesthesiology

## 2016-08-10 ENCOUNTER — Ambulatory Visit (HOSPITAL_COMMUNITY)
Admission: RE | Admit: 2016-08-10 | Discharge: 2016-08-10 | Disposition: A | Discharge status: Home / Self Care | Payer: Medicare Other | Source: Ambulatory Visit | Attending: Internal Medicine | Admitting: Internal Medicine

## 2016-08-10 ENCOUNTER — Encounter (HOSPITAL_COMMUNITY): Payer: Self-pay

## 2016-08-10 DIAGNOSIS — I110 Hypertensive heart disease with heart failure: Secondary | ICD-10-CM | POA: Diagnosis not present

## 2016-08-10 DIAGNOSIS — M1991 Primary osteoarthritis, unspecified site: Secondary | ICD-10-CM | POA: Diagnosis not present

## 2016-08-10 DIAGNOSIS — I481 Persistent atrial fibrillation: Secondary | ICD-10-CM

## 2016-08-10 DIAGNOSIS — Z79899 Other long term (current) drug therapy: Secondary | ICD-10-CM | POA: Insufficient documentation

## 2016-08-10 DIAGNOSIS — E785 Hyperlipidemia, unspecified: Secondary | ICD-10-CM | POA: Insufficient documentation

## 2016-08-10 DIAGNOSIS — Z96653 Presence of artificial knee joint, bilateral: Secondary | ICD-10-CM | POA: Insufficient documentation

## 2016-08-10 DIAGNOSIS — G4733 Obstructive sleep apnea (adult) (pediatric): Secondary | ICD-10-CM | POA: Insufficient documentation

## 2016-08-10 DIAGNOSIS — Z820 Family history of epilepsy and other diseases of the nervous system: Secondary | ICD-10-CM | POA: Diagnosis not present

## 2016-08-10 DIAGNOSIS — Z87891 Personal history of nicotine dependence: Secondary | ICD-10-CM | POA: Diagnosis not present

## 2016-08-10 DIAGNOSIS — Z8249 Family history of ischemic heart disease and other diseases of the circulatory system: Secondary | ICD-10-CM | POA: Insufficient documentation

## 2016-08-10 DIAGNOSIS — I4819 Other persistent atrial fibrillation: Secondary | ICD-10-CM

## 2016-08-10 DIAGNOSIS — Z6841 Body Mass Index (BMI) 40.0 and over, adult: Secondary | ICD-10-CM | POA: Diagnosis not present

## 2016-08-10 HISTORY — PX: CARDIOVERSION: SHX1299

## 2016-08-10 SURGERY — CARDIOVERSION
Anesthesia: General

## 2016-08-10 MED ORDER — HYDROCORTISONE 1 % EX CREA: 1.0000 "application " | TOPICAL_CREAM | Freq: Three times a day (TID) | CUTANEOUS | Status: DC | PRN

## 2016-08-10 MED ORDER — SODIUM CHLORIDE 0.9 % IV SOLN
INTRAVENOUS | Status: DC
  Administered 2016-08-10: 09:00:00 via INTRAVENOUS

## 2016-08-10 MED ORDER — LIDOCAINE HCL (CARDIAC) 20 MG/ML IV SOLN
INTRAVENOUS | Status: DC | PRN
  Administered 2016-08-10: 60 mg via INTRAVENOUS

## 2016-08-10 MED ORDER — PROPOFOL 10 MG/ML IV BOLUS
INTRAVENOUS | Status: DC | PRN
  Administered 2016-08-10: 20 mg via INTRAVENOUS
  Administered 2016-08-10: 100 mg via INTRAVENOUS
  Administered 2016-08-10: 30 mg via INTRAVENOUS

## 2016-08-10 MED ORDER — PROMETHAZINE HCL 25 MG/ML IJ SOLN: 6.2500 mg | INTRAMUSCULAR | Status: DC | PRN

## 2016-08-10 MED ORDER — PHENYLEPHRINE HCL 10 MG/ML IJ SOLN
INTRAMUSCULAR | Status: DC | PRN
  Administered 2016-08-10: 80 ug via INTRAVENOUS

## 2016-08-10 NOTE — CV Procedure (Signed)
    CARDIOVERSION NOTE  Procedure: Electrical Cardioversion Indications:  Atrial Fibrillation  Procedure Details:  Consent: Risks of procedure as well as the alternatives and risks of each were explained to the (patient/caregiver).  Consent for procedure obtained.  Time Out: Verified patient identification, verified procedure, site/side was marked, verified correct patient position, special equipment/implants available, medications/allergies/relevent history reviewed, required imaging and test results available.  Performed  Patient placed on cardiac monitor, pulse oximetry, supplemental oxygen as necessary.  Sedation given: Propofol per anesthesia Pacer pads placed anterior and posterior chest.  Cardioverted 3 time(s).  Cardioverted at 150J, 200J x 2.  Impression: Findings: Post procedure EKG shows: Atrial Fibrillation Complications: None Patient did tolerate procedure well.  Plan: 1. Unsuccessful DCCV after 3 attempts - even with post-cardioverson pause, atrial fibrillation was noted - no asystolic period was noted and no sinus beats were seen. Will likely need to be started on antiarrythmic therapy. Will defer to Dr. Irish Lack for this. He has follow-up in 2 weeks. 2. Continue Xarelto.   Time Spent Directly with the Patient:  30 minutes   Pixie Casino, MD, [facility] Attending Cardiologist [place] 08/10/2016, 9:55 AM

## 2016-08-10 NOTE — Transfer of Care (Signed)
Immediate Anesthesia Transfer of Care Note  Patient: Isaiah Jones  Procedure(s) Performed: Procedure(s): CARDIOVERSION (N/A)  Patient Location: PACU and Endoscopy Unit  Anesthesia Type:General  Level of Consciousness: awake, alert , oriented and patient cooperative  Airway & Oxygen Therapy: Patient Spontanous Breathing and Patient connected to nasal cannula oxygen  Post-op Assessment: Report given to RN and Post -op Vital signs reviewed and stable  Post vital signs: Reviewed and stable  Last Vitals:  Vitals:   08/10/16 0811  BP: 123/77  Pulse: (!) 57  Resp: 13    Last Pain: There were no vitals filed for this visit.       Complications: No apparent anesthesia complications

## 2016-08-10 NOTE — Discharge Instructions (Signed)
Electrical Cardioversion, Care After °This sheet gives you information about how to care for yourself after your procedure. Your health care provider may also give you more specific instructions. If you have problems or questions, contact your health care provider. °What can I expect after the procedure? °After the procedure, it is common to have: °· Some redness on the skin where the shocks were given. °Follow these instructions at home: °· Do not drive for 24 hours if you were given a medicine to help you relax (sedative). °· Take over-the-counter and prescription medicines only as told by your health care provider. °· Ask your health care provider how to check your pulse. Check it often. °· Rest for 48 hours after the procedure or as told by your health care provider. °· Avoid or limit your caffeine use as told by your health care provider. °Contact a health care provider if: °· You feel like your heart is beating too quickly or your pulse is not regular. °· You have a serious muscle cramp that does not go away. °Get help right away if: °· You have discomfort in your chest. °· You are dizzy or you feel faint. °· You have trouble breathing or you are short of breath. °· Your speech is slurred. °· You have trouble moving an arm or leg on one side of your body. °· Your fingers or toes turn cold or blue. °This information is not intended to replace advice given to you by your health care provider. Make sure you discuss any questions you have with your health care provider. °Document Released: 02/05/2013 Document Revised: 11/19/2015 Document Reviewed: 10/22/2015 °Elsevier Interactive Patient Education © 2017 Elsevier Inc. ° °

## 2016-08-10 NOTE — Anesthesia Preprocedure Evaluation (Signed)
Anesthesia Evaluation  Patient identified by MRN, date of birth, ID band Patient awake    Reviewed: Allergy & Precautions, NPO status , Patient's Chart, lab work & pertinent test results  Airway Mallampati: II  TM Distance: >3 FB Neck ROM: Full    Dental no notable dental hx.    Pulmonary sleep apnea and Continuous Positive Airway Pressure Ventilation , former smoker,    Pulmonary exam normal breath sounds clear to auscultation       Cardiovascular Normal cardiovascular exam+ dysrhythmias Atrial Fibrillation  Rhythm:Regular Rate:Normal     Neuro/Psych negative neurological ROS  negative psych ROS   GI/Hepatic negative GI ROS, Neg liver ROS,   Endo/Other  Morbid obesity  Renal/GU negative Renal ROS  negative genitourinary   Musculoskeletal negative musculoskeletal ROS (+)   Abdominal   Peds negative pediatric ROS (+)  Hematology negative hematology ROS (+)   Anesthesia Other Findings   Reproductive/Obstetrics negative OB ROS                             Anesthesia Physical Anesthesia Plan  ASA: III  Anesthesia Plan: General   Post-op Pain Management:    Induction: Intravenous  Airway Management Planned: Mask  Additional Equipment:   Intra-op Plan:   Post-operative Plan:   Informed Consent: I have reviewed the patients History and Physical, chart, labs and discussed the procedure including the risks, benefits and alternatives for the proposed anesthesia with the patient or authorized representative who has indicated his/her understanding and acceptance.   Dental advisory given  Plan Discussed with: CRNA and Surgeon  Anesthesia Plan Comments:         Anesthesia Quick Evaluation

## 2016-08-10 NOTE — Anesthesia Postprocedure Evaluation (Signed)
Anesthesia Post Note  Patient: Isaiah Jones  Procedure(s) Performed: Procedure(s) (LRB): CARDIOVERSION (N/A)  Patient location during evaluation: PACU Anesthesia Type: General Level of consciousness: awake and alert Pain management: pain level controlled Vital Signs Assessment: post-procedure vital signs reviewed and stable Respiratory status: spontaneous breathing, nonlabored ventilation, respiratory function stable and patient connected to nasal cannula oxygen Cardiovascular status: blood pressure returned to baseline and stable Postop Assessment: no signs of nausea or vomiting Anesthetic complications: no       Last Vitals:  Vitals:   08/10/16 0950 08/10/16 0955  BP: 138/71 139/70  Pulse: 64 70  Resp: 15 16  Temp:  36.5 C    Last Pain:  Vitals:   08/10/16 0955  TempSrc: Oral                 [name]

## 2016-08-10 NOTE — H&P (Signed)
    INTERVAL PROCEDURE H&P  History and Physical Interval Note:  08/10/2016 8:46 AM  Isaiah Jones has presented today for their planned procedure. The various methods of treatment have been discussed with the patient and family. After consideration of risks, benefits and other options for treatment, the patient has consented to the procedure.  The patients' outpatient history has been reviewed, patient examined, and no change in status from most recent office note within the past 30 days. I have reviewed the patients' chart and labs and will proceed as planned. Questions were answered to the patient's satisfaction.   Pixie Casino, MD, [facility] Attending Cardiologist [place] 08/10/2016, 8:46 AM

## 2016-08-11 ENCOUNTER — Telehealth: Payer: Self-pay | Admitting: Interventional Cardiology

## 2016-08-11 ENCOUNTER — Encounter (HOSPITAL_COMMUNITY): Payer: Self-pay | Admitting: Internal Medicine

## 2016-08-11 NOTE — Telephone Encounter (Signed)
Left message for patient to call back  

## 2016-08-11 NOTE — Telephone Encounter (Signed)
New Message  Pt voiced he was shocked 3X's never came out of afib, being this didn't work he is wanting to know what to do next from his procedure.  Please f/u

## 2016-08-11 NOTE — Telephone Encounter (Signed)
Follow Up:; ° ° °Returning your call. °

## 2016-08-11 NOTE — Telephone Encounter (Signed)
Patient called and he states that when they did his cardioversion that he did not convert back into SR. He denies having any symptoms at this time. Patient states that he has an appointment on 08/29/16 with Dr. Irish Lack and would like to discuss what the next steps are. Patient advised that if he is not having any symptoms that we would manage with with medications to control HR. Patient advised to let us know if he develops any symptoms. Patient verbalized understanding.

## 2016-08-29 ENCOUNTER — Encounter: Payer: Self-pay | Admitting: Interventional Cardiology

## 2016-08-29 ENCOUNTER — Ambulatory Visit (INDEPENDENT_AMBULATORY_CARE_PROVIDER_SITE_OTHER): Payer: Medicare Other | Admitting: Interventional Cardiology

## 2016-08-29 VITALS — BP 134/86 | HR 74 | Ht 74.0 in | Wt 359.2 lb

## 2016-08-29 DIAGNOSIS — I119 Hypertensive heart disease without heart failure: Secondary | ICD-10-CM | POA: Diagnosis not present

## 2016-08-29 DIAGNOSIS — Z9989 Dependence on other enabling machines and devices: Secondary | ICD-10-CM

## 2016-08-29 DIAGNOSIS — G4733 Obstructive sleep apnea (adult) (pediatric): Secondary | ICD-10-CM

## 2016-08-29 DIAGNOSIS — E785 Hyperlipidemia, unspecified: Secondary | ICD-10-CM

## 2016-08-29 DIAGNOSIS — I481 Persistent atrial fibrillation: Secondary | ICD-10-CM | POA: Diagnosis not present

## 2016-08-29 DIAGNOSIS — I4819 Other persistent atrial fibrillation: Secondary | ICD-10-CM

## 2016-08-29 NOTE — Patient Instructions (Signed)

## 2016-08-29 NOTE — Progress Notes (Signed)
Cardiology Office Note   Date:  08/29/2016   ID:  Isaiah Jones, DOB 1951/01/30, MRN [id number]  PCP:  Quentin Cornwall, MD    No chief complaint on file.    Wt Readings from Last 3 Encounters:  08/29/16 (!) 359 lb 3.2 oz (162.9 kg)  08/10/16 (!) 366 lb (166 kg)  08/08/16 (!) 366 lb (166 kg)       History of Present Illness: Isaiah Jones is a 66 y.o. male  with history of hyperlipidemia, hypertension and obesity. He also is treated for sleep apnea with CPAP. He was seen in our office back in March 2018 due to new onset atrial fibrillation. His pulmonologist found the irregular heartbeat. Atrial fibrillation was diagnosed. He has had some fatigue and intermittent palpitations over the past few months prior to diagnosis.  At his initial visit in our office in March 2018, he was started on Toprol-XL 50 mg daily for rate control, Xarelto 20 mg daily for stroke prevention. Cardioversion was considered as well. He was advised to lose weight.  Echocardiogram from March 2018 revealed: - Left ventricle: The cavity size was normal. Wall thickness was   increased in a pattern of mild LVH. Systolic function was normal.   The estimated ejection fraction was in the range of 55% to 60%.   Wall motion was normal; there were no regional wall motion   abnormalities. Doppler parameters are consistent with abnormal   left ventricular relaxation (grade 1 diastolic dysfunction). - Left atrium: The atrium was mildly dilated. Volume/bsa, S: 37.4   ml/m^2.  He walks at home and notices that his HR increases with activity to the 120s.   No palpitations.      He had his cardioversion but it was not successful.  He has been maintained on rate control meds.  No palpitations.  No bleeding problems.  He feels colder than he did before after starting anticoagulation.  He wants to start riding his bike.      Past Medical History:  Diagnosis Date  . Arthritis, degenerative   . 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 Colon polyps   . Dyslipidemia   . OSA on CPAP   . Persistent atrial fibrillation ([place])    Echo 3/18: Mild LVH, EF 55-60, normal wall motion, grade 1 diastolic dysfunction, mild LAE  . Rhinitis, allergic     Past Surgical History:  Procedure Laterality Date  . arthroscopic knee surgery Bilateral 2003  . CARDIOVERSION N/A 08/10/2016   Procedure: CARDIOVERSION;  Surgeon: Pixie Casino, MD;  Location: [facility] ENDOSCOPY;  Service: Cardiovascular;  Laterality: N/A;  . REPLACEMENT TOTAL KNEE Bilateral 2008  . TONSILLECTOMY    . VASECTOMY       Current Outpatient Prescriptions  Medication Sig Dispense Refill  . acetaminophen (TYLENOL) 500 MG tablet Take 500 mg by mouth every 6 (six) hours as needed.    . Amlodipine-Valsartan-HCTZ 5-160-12.5 MG TABS Take 1 tablet by mouth daily.     . diphenhydramine-acetaminophen (TYLENOL PM) 25-500 MG TABS tablet Take 1 tablet by mouth at bedtime as needed.    . fexofenadine (ALLEGRA) 180 MG tablet Take 180 mg by mouth daily as needed for allergies or rhinitis.    . Ginkgo Biloba (GINKOBA) 40 MG TABS Take 40 mg by mouth daily.    . metoprolol succinate (TOPROL-XL) 50 MG 24 hr tablet Take 1 tablet (50 mg total) by mouth daily. Take with or immediately following  a meal. 90 tablet 3  . mometasone (NASONEX) 50 MCG/ACT nasal spray Place 2 sprays into the nose daily.    . Multiple Vitamin (MULTI-DAY PO) Take 1 tablet by mouth daily.     . Multiple Vitamins-Minerals (AIRBORNE PO) Take 1 tablet by mouth daily.     . OMEGA-3 KRILL OIL PO Take 1 tablet by mouth daily.     . rivaroxaban (XARELTO) 20 MG TABS tablet Take 1 tablet (20 mg total) by mouth daily with supper. 90 tablet 3  . rosuvastatin (CRESTOR) 20 MG tablet Take 20 mg by mouth daily.    . sertraline (ZOLOFT) 50 MG tablet Take 50 mg by mouth daily.     Marland Kitchen thiamine (VITAMIN B-1) 100 MG tablet Take 100 mg by mouth daily.     No current facility-administered  medications for this visit.     Allergies:   Patient has no known allergies.    Social History:  The patient  reports that he quit smoking about 15 years ago. His smoking use included Cigarettes. He has never used smokeless tobacco. He reports that he drinks alcohol. He reports that he does not use drugs.   Family History:  The patient's family history includes Alzheimer's disease in his mother; Heart attack in his mother; Heart attack (age of onset: 16) in his father; Hypertension in his brother; Other in his brother.    ROS:  Please see the history of present illness.   Otherwise, review of systems are positive for increased headaches- sinus.   All other systems are reviewed and negative.    PHYSICAL EXAM: VS:  BP 134/86   Pulse 74   Ht 6\' 2"  (1.88 m)   Wt (!) 359 lb 3.2 oz (162.9 kg)   BMI 46.12 kg/m  , BMI Body mass index is 46.12 kg/m. GEN: Well nourished, well developed, in no acute distress  HEENT: normal  Neck: no JVD, carotid bruits, or masses Cardiac: irregularly irregular; no murmurs, rubs, or gallops,no edema  Respiratory:  clear to auscultation bilaterally, normal work of breathing GI: soft, nontender, nondistended, + BS MS: no deformity or atrophy  Skin: warm and dry, no rash Neuro:  Strength and sensation are intact Psych: euthymic mood, full affect   EKG:   The ekg ordered today demonstrates AFib, PVC, rate controlled, nonspecific ST segment changes   Recent Labs: 07/11/2016: TSH 1.590 08/08/2016: BUN 18; Creatinine, Ser 0.97; Platelets 194; Potassium 4.9; Sodium 141   Lipid Panel No results found for: CHOL, TRIG, HDL, CHOLHDL, VLDL, LDLCALC, LDLDIRECT   Other studies Reviewed: Additional studies/ records that were reviewed today with results demonstrating: echo as noted above; TSH normal in 3/18.   ASSESSMENT AND PLAN:  1. Atrial fibrillation:  Continue Toprol for rate control and Xarelto for stroke prevention. Unsuccessful cardioversion.  WOuld not  pursue antiarrhythic therapy given his lack of symptoms. I stressed the importance of being compliant with his Xarelto to reduce stroke risk.  We discussed the small risk of proarrhythmia with antiarrhythmic therapy. Given that, there is no point in adding medications that will not help him feel better. He is in agreement with this strategy. 2. Hypertensive heart disease: Blood pressure well controlled. Continue current medications.  3. Obesity: He is more motivated to lose weight. He joined [employer] and has lost over 20 lbs in the past few months. I've asked him to try to lose 20 pounds in the next 6 months. 4. OSA:  Continue CPAP.  I stressed  the importance of compliance with C Pap in order to help prevent adverse effects to his heart.  He has no problems at this time tolerating his C Pap. 5. Hyperlipidemia: Continue rosuvastatin. Lipids followed by primary care physician.  Currently, no symptoms of ischemia. He will let us know if this changes.   Current medicines are reviewed at length with the patient today.  The patient concerns regarding his medicines were addressed.  The following changes have been made:  No change  Labs/ tests ordered today include:  No orders of the defined types were placed in this encounter.   Recommend 150 minutes/week of aerobic exercise Low fat, low carb, high fiber diet recommended  Disposition:   FU in 6 months   Signed, Larae Grooms, MD  08/29/2016 8:23 AM    [facility] [place], [place], [place]  [address] Phone: [phone]; Fax: [phone]

## 2016-10-02 NOTE — Anesthesia Postprocedure Evaluation (Signed)
Anesthesia Post Note  Patient: Isaiah Jones  Procedure(s) Performed: Procedure(s) (LRB): CARDIOVERSION (N/A)     Anesthesia Post Evaluation  Last Vitals:  Vitals:   08/10/16 0955 08/10/16 1005  BP: 139/70 136/67  Pulse: 70 (!) 55  Resp: 16 (!) 23  Temp: 36.5 C     Last Pain:  Vitals:   08/10/16 0955  TempSrc: Oral                 Trena Dunavan S

## 2016-10-02 NOTE — Addendum Note (Signed)
Addendum  created 10/02/16 1316 by Myrtie Soman, MD   Sign clinical note

## 2017-02-25 NOTE — Progress Notes (Signed)
Cardiology Office Note   Date:  02/26/2017   ID:  Isaiah Jones, DOB November 17, 1950, MRN [id number]  PCP:  Quentin Cornwall, MD    No chief complaint on file. AFib   Wt Readings from Last 3 Encounters:  02/26/17 (!) 370 lb 6.4 oz (168 kg)  08/29/16 (!) 359 lb 3.2 oz (162.9 kg)  08/10/16 (!) 366 lb (166 kg)       History of Present Illness: Isaiah Jones is a 66 y.o. male  with history of hyperlipidemia, hypertension and obesity. He also is treated for sleep apnea with CPAP. He was seen in our office back in March 2018 due to new onset atrial fibrillation. His pulmonologist found the irregular heartbeat. Atrial fibrillation was diagnosed. He has had some fatigue and intermittent palpitations over the past few months prior to diagnosis.  At his initial visit in our office in March 2018, he was started on Toprol-XL 50 mg daily for rate control, Xarelto 20 mg daily for stroke prevention. Cardioversion was considered as well. He was advised to lose weight.  Echocardiogram from March 2018 revealed: - Left ventricle: The cavity size was normal. Wall thickness was increased in a pattern of mild LVH. Systolic function was normal. The estimated ejection fraction was in the range of 55% to 60%. Wall motion was normal; there were no regional wall motion abnormalities. Doppler parameters are consistent with abnormal left ventricular relaxation (grade 1 diastolic dysfunction). - Left atrium: The atrium was mildly dilated. Volume/bsa, S: 37.4 ml/m^2.  He had his cardioversion but it was not successful.  He has been maintained on rate control meds. He feels colder than he did before after starting anticoagulation.  Since the last visit, no sustained palpitations. He has seen his FitBit show an increased pulse for a time, but no symptoms.    Denies : Chest pain. Dizziness. Leg edema. Nitroglycerin use. Orthopnea. Palpitations. Paroxysmal nocturnal dyspnea. Shortness of breath.  Syncope.   He has gained weight since the last visit.  He had a back issue which limited activity.  No bleeding problems.  He admits to enjoying his ice cream.   Past Medical History:  Diagnosis Date  . Arthritis, degenerative   . 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 Colon polyps   . Dyslipidemia   . OSA on CPAP   . Persistent atrial fibrillation ([place])    Echo 3/18: Mild LVH, EF 55-60, normal wall motion, grade 1 diastolic dysfunction, mild LAE  . Rhinitis, allergic     Past Surgical History:  Procedure Laterality Date  . arthroscopic knee surgery Bilateral 2003  . CARDIOVERSION N/A 08/10/2016   Procedure: CARDIOVERSION;  Surgeon: Pixie Casino, MD;  Location: [facility] ENDOSCOPY;  Service: Cardiovascular;  Laterality: N/A;  . REPLACEMENT TOTAL KNEE Bilateral 2008  . TONSILLECTOMY    . VASECTOMY       Current Outpatient Prescriptions  Medication Sig Dispense Refill  . acetaminophen (TYLENOL) 500 MG tablet Take 500 mg by mouth every 6 (six) hours as needed.    . Amlodipine-Valsartan-HCTZ 5-160-12.5 MG TABS Take 1 tablet by mouth daily.     . diphenhydramine-acetaminophen (TYLENOL PM) 25-500 MG TABS tablet Take 1 tablet by mouth at bedtime as needed.    . fexofenadine (ALLEGRA) 180 MG tablet Take 180 mg by mouth daily as needed for allergies or rhinitis.    . Ginkgo Biloba (GINKOBA) 40 MG TABS Take 40 mg by mouth daily.    Marland Kitchen  latanoprost (XALATAN) 0.005 % ophthalmic solution Place 1 drop into both eyes daily.    . methocarbamol (ROBAXIN) 750 MG tablet Take 750 mg by mouth as needed.    . mometasone (NASONEX) 50 MCG/ACT nasal spray Place 2 sprays into the nose daily.    . Multiple Vitamin (MULTI-DAY PO) Take 1 tablet by mouth daily.     . Multiple Vitamins-Minerals (AIRBORNE PO) Take 1 tablet by mouth daily.     . Omega-3 Fatty Acids (FISH OIL PO) Take 1 tablet by mouth daily.    . OMEGA-3 KRILL OIL PO Take 1 tablet by mouth daily.     Marland Kitchen PENNSAID 2 %  SOLN Apply 1-2 Pump topically as needed.  1  . rivaroxaban (XARELTO) 20 MG TABS tablet Take 1 tablet (20 mg total) by mouth daily with supper. 90 tablet 3  . rosuvastatin (CRESTOR) 20 MG tablet Take 20 mg by mouth daily.    . sertraline (ZOLOFT) 50 MG tablet Take 50 mg by mouth daily.     Marland Kitchen thiamine (VITAMIN B-1) 100 MG tablet Take 100 mg by mouth daily.    . metoprolol succinate (TOPROL-XL) 50 MG 24 hr tablet Take 1 tablet (50 mg total) by mouth daily. Take with or immediately following a meal. 90 tablet 3   No current facility-administered medications for this visit.     Allergies:   Patient has no known allergies.    Social History:  The patient  reports that he quit smoking about 15 years ago. His smoking use included Cigarettes. He has never used smokeless tobacco. He reports that he drinks alcohol. He reports that he does not use drugs.   Family History:  The patient's family history includes Alzheimer's disease in his mother; Heart attack in his mother; Heart attack (age of onset: 13) in his father; Hypertension in his brother; Other in his brother.    ROS:  Please see the history of present illness.   Otherwise, review of systems are positive for weight gain.   All other systems are reviewed and negative.    PHYSICAL EXAM: VS:  BP 124/76   Pulse (!) 56   Ht 6\' 2"  (1.88 m)   Wt (!) 370 lb 6.4 oz (168 kg)   SpO2 90%   BMI 47.56 kg/m  , BMI Body mass index is 47.56 kg/m. GEN: Well nourished, well developed, in no acute distress  HEENT: normal  Neck: no JVD, carotid bruits, or masses Cardiac: bradycardic, irregular; no murmurs, rubs, or gallops,no edema  Respiratory:  clear to auscultation bilaterally, normal work of breathing GI: soft, nontender, nondistended, + BS MS: no deformity or atrophy  Skin: warm and dry, no rash Neuro:  Strength and sensation are intact Psych: euthymic mood, full affect    Recent Labs: 07/11/2016: TSH 1.590 08/08/2016: BUN 18; Creatinine, Ser  0.97; Hemoglobin 16.9; Platelets 194; Potassium 4.9; Sodium 141   Lipid Panel No results found for: CHOL, TRIG, HDL, CHOLHDL, VLDL, LDLCALC, LDLDIRECT   Other studies Reviewed: Additional studies/ records that were reviewed today with results demonstrating: Creatinine 0.97 in April 2018.  ALT controlled.   ASSESSMENT AND PLAN:  1. Atrial fibrilllation: Rate controlled.  No bleeding issues.  No lightheadedness or syncope.  If he feels any symptoms of low heart rate, he should let us know.  Would decrease Toprol.  At this time, he states that his heart rate increases significantly with activity.  Since he has no symptoms of bradycardia, would leave medicines where  they are. 2. Hypertensive heart disease: COntrolled. COntinue current meds.  3. Obesity: We spoke about weight loss strategies.  He needs to try to cut back on added sugar in his diet.  We spoke about intermittent fasting.  This is going to be the biggest issue for him going forward. 4. OSA: Has been using CPAP. 5. Hyperlipidemia: He had his lipids checked with his primary care doctor.  He states his triglycerides were elevated.  Reducing sugar in his diet should help his triglycerides as well.   Current medicines are reviewed at length with the patient today.  The patient concerns regarding his medicines were addressed.  The following changes have been made:  No change  Labs/ tests ordered today include:  No orders of the defined types were placed in this encounter.   Recommend 150 minutes/week of aerobic exercise Low fat, low carb, high fiber diet recommended  Disposition:   FU in 1 year   Signed, Larae Grooms, MD  02/26/2017 8:32 AM    [facility] [place], [place], [place]  [address] Phone: [phone]; Fax: [phone]

## 2017-02-26 ENCOUNTER — Encounter: Payer: Self-pay | Admitting: Interventional Cardiology

## 2017-02-26 ENCOUNTER — Other Ambulatory Visit: Payer: Self-pay | Admitting: *Deleted

## 2017-02-26 ENCOUNTER — Ambulatory Visit (INDEPENDENT_AMBULATORY_CARE_PROVIDER_SITE_OTHER): Payer: Medicare Other | Admitting: Interventional Cardiology

## 2017-02-26 VITALS — BP 124/76 | HR 56 | Ht 74.0 in | Wt 370.4 lb

## 2017-02-26 DIAGNOSIS — I119 Hypertensive heart disease without heart failure: Secondary | ICD-10-CM | POA: Diagnosis not present

## 2017-02-26 DIAGNOSIS — E782 Mixed hyperlipidemia: Secondary | ICD-10-CM

## 2017-02-26 DIAGNOSIS — I481 Persistent atrial fibrillation: Secondary | ICD-10-CM | POA: Diagnosis not present

## 2017-02-26 DIAGNOSIS — G4733 Obstructive sleep apnea (adult) (pediatric): Secondary | ICD-10-CM | POA: Diagnosis not present

## 2017-02-26 DIAGNOSIS — I4819 Other persistent atrial fibrillation: Secondary | ICD-10-CM

## 2017-02-26 MED ORDER — RIVAROXABAN 20 MG PO TABS: 20.0000 mg | ORAL_TABLET | Freq: Every day | ORAL | 1 refills | Status: DC

## 2017-02-26 MED ORDER — METOPROLOL SUCCINATE ER 50 MG PO TB24: 50.0000 mg | ORAL_TABLET | Freq: Every day | ORAL | 3 refills | Status: DC

## 2017-02-26 MED ORDER — RIVAROXABAN 20 MG PO TABS: 20.0000 mg | ORAL_TABLET | Freq: Every day | ORAL | 3 refills | Status: DC

## 2017-02-26 NOTE — Telephone Encounter (Signed)
Patient called and stated that he just left from the office and his rx's were sent to the wrong pharmacy. He states that his insurance requires him to get his maintenance meds from mail order but they were sent to [facility]. He states that he has always received these refills from his mail order and he even specified this pharmacy on the sheet provided to him by check in. I apologized for this and made him aware that these will be re-sent to the correct pharmacy.

## 2017-02-26 NOTE — Telephone Encounter (Signed)
Xarelto refilled to mail order, sending metoprolol to refill pool, thanks.

## 2017-02-26 NOTE — Patient Instructions (Signed)

## 2017-10-10 ENCOUNTER — Other Ambulatory Visit: Payer: Self-pay | Admitting: Interventional Cardiology

## 2017-10-11 NOTE — Telephone Encounter (Signed)
Pt is a 67 yr old male who saw Dr Irish Lack 02/26/17. Weight at that visit was 168Kg. Last SCr was 0.8 from 01/31/2017. CrCL is 210 mL/min. Will refill Xarelto 20mg .

## 2018-02-03 NOTE — Progress Notes (Signed)
Cardiology Office Note   Date:  02/04/2018   ID:  Isaiah Jones, DOB 27-Mar-1951, MRN [id number]  PCP:  Quentin Cornwall, MD    No chief complaint on file.  AFib  Wt Readings from Last 3 Encounters:  02/04/18 (!) 355 lb 12.8 oz (161.4 kg)  02/26/17 (!) 370 lb 6.4 oz (168 kg)  08/29/16 (!) 359 lb 3.2 oz (162.9 kg)       History of Present Illness: Isaiah Jones is a 67 y.o. male  with history of hyperlipidemia, hypertension and obesity. He also is treated for sleep apnea with CPAP. He was seen in our office back in March 2018 due to new onset atrial fibrillation. His pulmonologist found the irregular heartbeat. Atrial fibrillation was diagnosed. He has had some fatigue and intermittent palpitations over the past few months prior to diagnosis.  At his initial visit in our office in March 2018, he was started on Toprol-XL 50 mg daily for rate control, Xarelto 20 mg daily for stroke prevention. Cardioversion was considered as well. He was advised to lose weight.  Echocardiogram from March 2018 revealed: - Left ventricle: The cavity size was normal. Wall thickness was increased in a pattern of mild LVH. Systolic function was normal. The estimated ejection fraction was in the range of 55% to 60%. Wall motion was normal; there were no regional wall motion abnormalities. Doppler parameters are consistent with abnormal left ventricular relaxation (grade 1 diastolic dysfunction). - Left atrium: The atrium was mildly dilated. Volume/bsa, S: 37.4 ml/m^2.  He had his cardioversion but it was not successful. He has been maintained on rate control meds. He feels colder than he did before after starting anticoagulation.  At the last visit, he had gained weight after a back problem, and enjoying ice cream consistently.   Since the last visit, he has lost weight.    Denies : Chest pain. Dizziness. Leg edema. Nitroglycerin use. Orthopnea. Palpitations. Paroxysmal nocturnal  dyspnea. Shortness of breath. Syncope.   Has a watch that he can detect HR.    He has some hip problems but was told he is too big for ortho surgery.  He was 375 lbs at that time.  In 4 weeks, he has lost 20 lbs.      He walks for exercise.     Past Medical History:  Diagnosis Date  . Arthritis, degenerative   . 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 Colon polyps   . Dyslipidemia   . OSA on CPAP   . Persistent atrial fibrillation    Echo 3/18: Mild LVH, EF 55-60, normal wall motion, grade 1 diastolic dysfunction, mild LAE  . Rhinitis, allergic     Past Surgical History:  Procedure Laterality Date  . arthroscopic knee surgery Bilateral 2003  . CARDIOVERSION N/A 08/10/2016   Procedure: CARDIOVERSION;  Surgeon: Pixie Casino, MD;  Location: [facility] ENDOSCOPY;  Service: Cardiovascular;  Laterality: N/A;  . REPLACEMENT TOTAL KNEE Bilateral 2008  . TONSILLECTOMY    . VASECTOMY       Current Outpatient Medications  Medication Sig Dispense Refill  . acetaminophen (TYLENOL) 500 MG tablet Take 500 mg by mouth every 6 (six) hours as needed.    . Amlodipine-Valsartan-HCTZ 5-160-12.5 MG TABS Take 1 tablet by mouth daily.     . diphenhydramine-acetaminophen (TYLENOL PM) 25-500 MG TABS tablet Take 1 tablet by mouth at bedtime as needed.    . fexofenadine (ALLEGRA) 180 MG tablet Take  180 mg by mouth daily as needed for allergies or rhinitis.    . Ginkgo Biloba (GINKOBA) 40 MG TABS Take 40 mg by mouth daily.    Marland Kitchen latanoprost (XALATAN) 0.005 % ophthalmic solution Place 1 drop into both eyes daily.    . methocarbamol (ROBAXIN) 750 MG tablet Take 750 mg by mouth as needed.    . metoprolol succinate (TOPROL-XL) 50 MG 24 hr tablet Take 50 mg by mouth daily. Take with or immediately following a meal.    . mometasone (NASONEX) 50 MCG/ACT nasal spray Place 2 sprays into the nose daily.    . Multiple Vitamin (MULTI-DAY PO) Take 1 tablet by mouth daily.     . Multiple  Vitamins-Minerals (AIRBORNE PO) Take 1 tablet by mouth daily.     . Omega-3 Fatty Acids (FISH OIL PO) Take 1 tablet by mouth daily.    . OMEGA-3 KRILL OIL PO Take 1 tablet by mouth daily.     Marland Kitchen PENNSAID 2 % SOLN Apply 1-2 Pump topically as needed.  1  . rosuvastatin (CRESTOR) 20 MG tablet Take 20 mg by mouth daily.    . sertraline (ZOLOFT) 50 MG tablet Take 50 mg by mouth daily.     Marland Kitchen thiamine (VITAMIN B-1) 100 MG tablet Take 100 mg by mouth daily.    Alveda Reasons 20 MG TABS tablet TAKE 1 TABLET DAILY WITH   SUPPER 90 tablet 1   No current facility-administered medications for this visit.     Allergies:   Patient has no known allergies.    Social History:  The patient  reports that he quit smoking about 16 years ago. His smoking use included cigarettes. He has never used smokeless tobacco. He reports that he drinks alcohol. He reports that he does not use drugs.   Family History:  The patient's family history includes Alzheimer's disease in his mother; Heart attack in his mother; Heart attack (age of onset: 62) in his father; Hypertension in his brother; Other in his brother.    ROS:  Please see the history of present illness.   Otherwise, review of systems are positive for intentional weight loss.   All other systems are reviewed and negative.    PHYSICAL EXAM: VS:  BP 118/82   Pulse 60   Ht 6\' 2"  (1.88 m)   Wt (!) 355 lb 12.8 oz (161.4 kg)   SpO2 91%   BMI 45.68 kg/m  , BMI Body mass index is 45.68 kg/m. GEN: Well nourished, well developed, in no acute distress  HEENT: normal  Neck: no JVD, carotid bruits, or masses Cardiac: RRR; no murmurs, rubs, or gallops,no edema  Respiratory:  clear to auscultation bilaterally, normal work of breathing GI: soft, nontender, nondistended, + BS MS: no deformity or atrophy  Skin: warm and dry, no rash Neuro:  Strength and sensation are intact Psych: euthymic mood, full affect   EKG:   The ekg ordered today demonstrates AFib, rate  controlled   Recent Labs: No results found for requested labs within last 8760 hours.   Lipid Panel No results found for: CHOL, TRIG, HDL, CHOLHDL, VLDL, LDLCALC, LDLDIRECT   Other studies Reviewed: Additional studies/ records that were reviewed today with results demonstrating: .   ASSESSMENT AND PLAN:  1. AFib : Rate controlled.  Stroke prevention with Xarelto.  No bleeding problems. BMet and CBC every 6 months.  Get records from PMD in [facility], [place], [place]. 2. Hypertensive heart disease: The current medical regimen  is effective;  continue present plan and medications. 3. Obesity: He has lost some weight in the last few weeks.  Continue his efforts.   4. OSA: Contiue CPAP.  5. Hyperlipidemia: Continue Crestor.  Labs at PMD.     Current medicines are reviewed at length with the patient today.  The patient concerns regarding his medicines were addressed.  The following changes have been made:  No change  Labs/ tests ordered today include:  No orders of the defined types were placed in this encounter.   Recommend 150 minutes/week of aerobic exercise Low fat, low carb, high fiber diet recommended  Disposition:   FU in 1 year   Signed, Larae Grooms, MD  02/04/2018 10:23 AM    [facility] [place], [place], [place]  [address] Phone: [phone]; Fax: [phone]

## 2018-02-04 ENCOUNTER — Ambulatory Visit: Payer: Medicare Other | Admitting: Interventional Cardiology

## 2018-02-04 ENCOUNTER — Encounter

## 2018-02-04 ENCOUNTER — Encounter: Payer: Self-pay | Admitting: Interventional Cardiology

## 2018-02-04 VITALS — BP 118/82 | HR 60 | Ht 74.0 in | Wt 355.8 lb

## 2018-02-04 DIAGNOSIS — G4733 Obstructive sleep apnea (adult) (pediatric): Secondary | ICD-10-CM | POA: Diagnosis not present

## 2018-02-04 DIAGNOSIS — I119 Hypertensive heart disease without heart failure: Secondary | ICD-10-CM

## 2018-02-04 DIAGNOSIS — I4819 Other persistent atrial fibrillation: Secondary | ICD-10-CM

## 2018-02-04 DIAGNOSIS — E782 Mixed hyperlipidemia: Secondary | ICD-10-CM

## 2018-02-04 NOTE — Patient Instructions (Signed)
Medication Instructions:  Your physician recommends that you continue on your current medications as directed. Please refer to the Current Medication list given to you today.  If you need a refill on your cardiac medications before your next appointment, please call your pharmacy.   Lab work: None Ordered  Please have your Primary Care Doctor fax a copy of your lab results to [place]  If you have labs (blood work) drawn today and your tests are completely normal, you will receive your results only by: Marland Kitchen MyChart Message (if you have MyChart) OR . A paper copy in the mail If you have any lab test that is abnormal or we need to change your treatment, we will call you to review the results.  Testing/Procedures: None ordered  Follow-Up: At [facility], you and your health needs are our priority.  As part of our continuing mission to provide you with exceptional heart care, we have created designated Provider Care Teams.  These Care Teams include your primary Cardiologist (physician) and Advanced Practice Providers (APPs -  Physician Assistants and Nurse Practitioners) who all work together to provide you with the care you need, when you need it. . You will need a follow up appointment in 1 year.  Please call our office 2 months in advance to schedule this appointment.  You may see Casandra Doffing, MD or one of the following Advanced Practice Providers on your designated Care Team:   . Lyda Jester, PA-C . Dayna Dunn, PA-C . Ermalinda Barrios, PA-C  Any Other Special Instructions Will Be Listed Below (If Applicable).

## 2018-03-09 DIAGNOSIS — Z6841 Body Mass Index (BMI) 40.0 and over, adult: Secondary | ICD-10-CM | POA: Insufficient documentation

## 2018-03-09 DIAGNOSIS — T148XXA Other injury of unspecified body region, initial encounter: Secondary | ICD-10-CM | POA: Insufficient documentation

## 2018-03-09 DIAGNOSIS — M79671 Pain in right foot: Secondary | ICD-10-CM | POA: Insufficient documentation

## 2018-03-09 HISTORY — DX: Body Mass Index (BMI) 40.0 and over, adult: Z684

## 2018-04-10 ENCOUNTER — Ambulatory Visit: Payer: Medicare Other | Admitting: Interventional Cardiology

## 2018-05-07 ENCOUNTER — Other Ambulatory Visit: Payer: Self-pay | Admitting: Interventional Cardiology

## 2018-05-08 ENCOUNTER — Other Ambulatory Visit: Payer: Self-pay | Admitting: *Deleted

## 2018-05-08 MED ORDER — RIVAROXABAN 20 MG PO TABS: ORAL_TABLET | ORAL | 2 refills | Status: DC

## 2018-05-08 NOTE — Telephone Encounter (Signed)
Xarelto 20mg  paper refill request received from [facility]; pt is 68 yrs old, wt-161.4kg, Crea-0.80 on 02/07/18 via scanned labs from [facility], last seen by Dr. Irish Lack on 02/04/18, CrCl-201.60ml/min; will send in refill to requested pharmacy.

## 2018-05-08 NOTE — Addendum Note (Signed)
Addended by: Derrel Nip B on: 05/08/2018 08:33 AM   Modules accepted: Orders

## 2018-05-24 ENCOUNTER — Other Ambulatory Visit: Payer: Self-pay | Admitting: Physical Medicine and Rehabilitation

## 2018-05-24 DIAGNOSIS — S0541XA Penetrating wound of orbit with or without foreign body, right eye, initial encounter: Secondary | ICD-10-CM

## 2018-05-24 DIAGNOSIS — S0542XA Penetrating wound of orbit with or without foreign body, left eye, initial encounter: Principal | ICD-10-CM

## 2018-05-24 DIAGNOSIS — M533 Sacrococcygeal disorders, not elsewhere classified: Secondary | ICD-10-CM

## 2018-05-24 DIAGNOSIS — M25551 Pain in right hip: Secondary | ICD-10-CM

## 2018-05-26 ENCOUNTER — Other Ambulatory Visit: Payer: Medicare Other

## 2018-05-27 ENCOUNTER — Ambulatory Visit
Admission: RE | Admit: 2018-05-27 | Discharge: 2018-05-27 | Disposition: A | Discharge status: Home / Self Care | Payer: Medicare Other | Source: Ambulatory Visit | Attending: Physical Medicine and Rehabilitation | Admitting: Physical Medicine and Rehabilitation

## 2018-05-27 ENCOUNTER — Other Ambulatory Visit: Payer: Self-pay | Admitting: Physical Medicine and Rehabilitation

## 2018-05-27 DIAGNOSIS — M25551 Pain in right hip: Secondary | ICD-10-CM

## 2018-05-27 DIAGNOSIS — S0542XA Penetrating wound of orbit with or without foreign body, left eye, initial encounter: Principal | ICD-10-CM

## 2018-05-27 DIAGNOSIS — M533 Sacrococcygeal disorders, not elsewhere classified: Secondary | ICD-10-CM

## 2018-05-27 DIAGNOSIS — S0541XA Penetrating wound of orbit with or without foreign body, right eye, initial encounter: Secondary | ICD-10-CM

## 2018-06-12 ENCOUNTER — Telehealth: Payer: Self-pay | Admitting: Interventional Cardiology

## 2018-06-12 NOTE — Telephone Encounter (Signed)
I returned call to the pt though got his vm. Left message to call our office in regards to his call about surgery.

## 2018-06-12 NOTE — Telephone Encounter (Signed)
I s/w pt and he was telling me about surgery he needs for his hip with [place] surgery with Dr. [name]. I advised pt that I will need for [facility] to fax over a cardiac clearance form. I called [facility] and found that the pt has not yet been seen by their office yet in regard to surgery. Per [facility] pt needs to call and make an appt. I advised I will call the pt and let him know. I then s/w pt and advised he first needs to call [facility] and make an appt to discuss about surgery. Pt thanked me and apologized that he misunderstood. I said nothing to apologize for. Pt thanked me for the help.

## 2018-06-12 NOTE — Telephone Encounter (Signed)
New Message:   Patient calling concerning a surgery he would like to have. Please call patient he some questions.

## 2018-07-10 ENCOUNTER — Other Ambulatory Visit (HOSPITAL_COMMUNITY): Payer: Self-pay | Admitting: Surgery

## 2018-07-23 ENCOUNTER — Ambulatory Visit: Payer: Medicare Other | Admitting: Skilled Nursing Facility1

## 2018-07-26 ENCOUNTER — Other Ambulatory Visit (HOSPITAL_COMMUNITY): Payer: Medicare Other

## 2018-07-26 ENCOUNTER — Ambulatory Visit (HOSPITAL_COMMUNITY): Payer: Medicare Other

## 2018-08-21 ENCOUNTER — Ambulatory Visit (HOSPITAL_COMMUNITY): Payer: Medicare Other

## 2018-10-03 ENCOUNTER — Other Ambulatory Visit: Payer: Self-pay

## 2018-10-03 ENCOUNTER — Ambulatory Visit (HOSPITAL_COMMUNITY)
Admission: RE | Admit: 2018-10-03 | Discharge: 2018-10-03 | Disposition: A | Discharge status: Home / Self Care | Payer: Medicare Other | Source: Ambulatory Visit | Attending: Surgery | Admitting: Surgery

## 2018-10-03 ENCOUNTER — Encounter: Payer: Self-pay | Admitting: Dietician

## 2018-10-03 ENCOUNTER — Encounter: Payer: Medicare Other | Attending: Surgery | Admitting: Dietician

## 2018-10-03 ENCOUNTER — Other Ambulatory Visit (HOSPITAL_COMMUNITY): Payer: Self-pay | Admitting: Surgery

## 2018-10-03 VITALS — Ht 74.0 in | Wt 346.7 lb

## 2018-10-03 DIAGNOSIS — I10 Essential (primary) hypertension: Secondary | ICD-10-CM | POA: Diagnosis not present

## 2018-10-03 DIAGNOSIS — E7800 Pure hypercholesterolemia, unspecified: Secondary | ICD-10-CM | POA: Diagnosis not present

## 2018-10-03 DIAGNOSIS — E669 Obesity, unspecified: Secondary | ICD-10-CM | POA: Diagnosis present

## 2018-10-03 DIAGNOSIS — Z7901 Long term (current) use of anticoagulants: Secondary | ICD-10-CM | POA: Insufficient documentation

## 2018-10-03 DIAGNOSIS — G4733 Obstructive sleep apnea (adult) (pediatric): Secondary | ICD-10-CM | POA: Insufficient documentation

## 2018-10-03 DIAGNOSIS — Z6841 Body Mass Index (BMI) 40.0 and over, adult: Secondary | ICD-10-CM | POA: Insufficient documentation

## 2018-10-03 DIAGNOSIS — Z79899 Other long term (current) drug therapy: Secondary | ICD-10-CM | POA: Diagnosis not present

## 2018-10-03 NOTE — Progress Notes (Signed)
Bariatric Pre-Op Nutrition Assessment Medical Nutrition Therapy  Appt Start Time: 7:30am  End time: 8:30am  Patient was seen on 10/03/2018 for Pre-Operative Nutrition Assessment. Assessment and letter of approval faxed to [facility] Surgery Bariatric Surgery Program coordinator on 10/03/2018.   Planned surgery: Sleeve Gastrectomy  Pt expectation of surgery: to weigh ~220 lbs; ultimately to qualify for hip replacement; to relieve high blood pressure, high cholesterol, sleep apnea Pt expectation of dietitian: none stated   Anthropometrics  Start weight at [facility]: 346.7 lbs (date: 10/03/2018) Height: 74 in BMI: 44.5 kg/m2    Clinical  Medical Hx: obesity, HTN, hypercholesterolemia, sleep apnea, COPD Surgeries: bilateral knee replacements (2008)  Medications: rosuvastatin, Xarelto, sertraline, amlodipine valsartan, mometasone furoate, metoprolol succinate ER, Tylenol, latanoprost, methocarbamol, Allegra, airborne Allergies: NKA  Psychosocial/Lifestyle Pt lives his wife, and states he wishes she could be here during today's appointment because they both share food shopping/ cooking responsibilities. They have grandkids between 63 and 55 years old. Likes gardening and Haematologist, is currently retired from working at [employer]. Pt seems organized and motivated for surgery, took notes during today's assessment and asked lots of questions along the way.  Has a friend who had bariatric surgery and has done research online.   Pt states weight gain picked up in the 90s/ early 2000s when he worked 12 hour shifts 7 days/week and was not doing much other than "working and sleeping." States they did not have lunchbreaks at work either so you had to quickly squeeze in whatever you could at some point during the shift. Currently, now that pt is retired, he states it is difficult not having a schedule/structure and that mealtimes are random.   24-Hr Dietary Recall First Meal: usually skips (or bacon +  eggs) Snack: none Second Meal: tomato sandwich (or cheese + crackers) Snack: none  Third Meal: taco salad (or grilled chicken, or burger)  Snack: usually skips (or ice cream)  Beverages: water, sweet tea  Food & Nutrition Related Hx Dietary Hx: States he does not always have a set eating schedule. For example, may eat breakfast and do a light lunch (if anything for lunch) or usually skips breakfast and has a heavier lunch. Does not snack much during the day because usually busy doing yardwork or gardening. States he and his wife love ice cream, and that his wife is a good cook. May eat out 1x/month.  Estimated Daily Fluid Intake: 3-4 water bottles on average, or up to 8-10/day  Supplements: centrum silver, omega-3 krill oil, ginkgo biloba, B12  Physical Activity  Current average weekly physical activity: ADLs, yardwork, gardening   Estimated Energy Needs Calories: 2000 Carbohydrate: 225g Protein: 125g Fat: 67g  Pre-Op Goals Reviewed with the Patient . Track food and beverage intake (try MyFitness Pal or the Baritastic app) . Make healthy food choices while monitoring portion sizes . Consume 3 meals per day or try to eat every 3-5 hours . Avoid concentrated sugars and fried foods . Keep sugar & fat in the single digits per serving on food labels . Practice CHEWING your food (aim for applesauce consistency) . Practice not drinking 15 minutes before, during, and 30 minutes after each meal and snack . Avoid all carbonated beverages (ex: soda, sparkling beverages)  . Limit caffeinated beverages (ex: coffee, tea, energy drinks) . Avoid all sugar-sweetened beverages (ex: regular soda, sports drinks)  . Avoid alcohol  . Aim for 64-100 ounces of FLUID daily (with at least half of fluid intake being plain water)  .  Aim for at least 60-80 grams of PROTEIN daily . Look for a liquid protein source that contains ?15 g protein and ?5 g carbohydrate (ex: shakes, drinks, shots) . Make a list of  non-food related activities . Physical activity is an important part of a healthy lifestyle so keep it moving! The goal is to reach 150 minutes of exercise per week, including cardiovascular and weight baring activity.  *Goals that are bolded indicate the pt would like to start working towards these  Handouts Provided Include  . Bariatric Surgery Nutrition Visits  . Pre-Op Goals  Learning Style & Readiness for Change Teaching method utilized: Visual & Auditory  Demonstrated degree of understanding via: Teach Back  Barriers to learning/adherence to lifestyle change: None Identified  RD's Notes for Next Visit . Pick up with protein on Pre-Op Goals sheet  . Go over Protein Shakes sheet  Next Steps Supervised Weight Loss (SWL) Visits Needed: 6  Patient is to return to [facility] in 1 month for 1st SWL Visit.  Patient is to call [facility] to enroll in Pre-Op Class (>2 weeks before surgery) and Post-Op Class (2 weeks after surgery) for further nutrition education when surgery date is scheduled.

## 2018-10-03 NOTE — Patient Instructions (Signed)
Great meeting you today! Start working through your Pre-Op Goals that we discussed today, maybe pick 1 or 2 goals to focus on this month.   See you next month for your 1st supervised visit!

## 2018-10-17 LAB — CBC AND DIFFERENTIAL
HCT: 44 (ref 41–53)
Hemoglobin: 15.3 (ref 13.5–17.5)
Platelets: 188 (ref 150–399)
WBC: 6.5

## 2018-10-17 LAB — BASIC METABOLIC PANEL
BUN: 15 (ref 4–21)
Creatinine: 0.7 (ref 0.6–1.3)
Glucose: 105
Potassium: 4.2 (ref 3.4–5.3)
Sodium: 142 (ref 137–147)

## 2018-11-05 ENCOUNTER — Other Ambulatory Visit: Payer: Self-pay

## 2018-11-05 ENCOUNTER — Encounter: Payer: Medicare Other | Attending: Surgery | Admitting: Skilled Nursing Facility1

## 2018-11-05 DIAGNOSIS — Z7901 Long term (current) use of anticoagulants: Secondary | ICD-10-CM | POA: Insufficient documentation

## 2018-11-05 DIAGNOSIS — I10 Essential (primary) hypertension: Secondary | ICD-10-CM | POA: Insufficient documentation

## 2018-11-05 DIAGNOSIS — G4733 Obstructive sleep apnea (adult) (pediatric): Secondary | ICD-10-CM | POA: Diagnosis not present

## 2018-11-05 DIAGNOSIS — Z79899 Other long term (current) drug therapy: Secondary | ICD-10-CM | POA: Diagnosis not present

## 2018-11-05 DIAGNOSIS — Z6841 Body Mass Index (BMI) 40.0 and over, adult: Secondary | ICD-10-CM | POA: Diagnosis not present

## 2018-11-05 DIAGNOSIS — E7800 Pure hypercholesterolemia, unspecified: Secondary | ICD-10-CM | POA: Insufficient documentation

## 2018-11-05 DIAGNOSIS — E669 Obesity, unspecified: Secondary | ICD-10-CM

## 2018-11-05 NOTE — Progress Notes (Signed)
Bariatric Supervised Weight Loss Visit Appt Start Time: 7:24  End Time: 8:00    Referral Stated SWL Appointments Required: 6: 1st SWL   Proposed Surgery Type: Sleeve Gastrectomy   NUTRITION ASSESSMENT   Anthropometrics  Start weight at [facility]: 346.7 lbs (date: 10/03/2018) Today's weight: 349.6 lbs Weight change: +3 lbs (since previous nutrition appointment)  BMI: 44.5 kg/m2    Clinical  Medical Hx: obesity, HTN, hypercholesterolemia, sleep apnea, COPD Surgeries: bilateral knee replacements (2008)  Medications: rosuvastatin, Xarelto, sertraline, amlodipine valsartan, mometasone furoate, metoprolol succinate ER, Tylenol, latanoprost, methocarbamol, Allegra, airborne   Psychosocial/Lifestyle  Pt expectation of surgery: to weigh ~220 lbs; ultimately to qualify for hip replacement; to relieve high blood pressure, high cholesterol, sleep apnea  Instread of drinking and smoking I have been eating. Pt states he stopped smoking in 2003.  Pt states he has recently discovered blood in his urine, he has a doctors appt for this ailment. Pt states he has already seen one doctor for it but did not diagnose him with anything. Pt states he has been eating to deal with the stress of not knowing what is going on. Pt states he stopped riding a bicycle once he discovered blood in his urine and states he will start it back once he knows what is going on. Pt states whatever he runs across he will eat in the kitchen.  Pt states he has not had a alcoholic drink in 3 months: pt states he has a past hx of alcoholism for about 5-10 years.   Pt states he has switched from alcohol being his vice to food being his vice and recognizes making changes after 68 years of doing something will be difficult.  Pt state she cannot work on any new goals until he knows what is going on with his urine; pt states he is convinced it is cancer: Dietitian will call pt next week to check in on his news and help him pick out his goal for  the month which may be to work on processing information/news differently and coping differently with the negative news/informaiton.  Labs:  Notable Signs/Symptoms   24-Hr Dietary Recall First Meal: tomato sandwich  Snack: cubes of cheese Second Meal: tomato sandwcih Snack: apple Handful of chips Third Meal: salad with grilled chicken  Snack: banana  Beverages: 8-10 bottles water, sweet drink   Physical Activity  ADL's  Percent Successful in meeting last appointments goals:  45% (as stated by pt)   Pre-Op Goals Previously Chosen  - chewing his food until applesauce consistency  - Not drinking with meals    Estimated Energy Needs Calories: 1800 Carbohydrate: 200 Protein: 135 Fat: 50     NUTRITION DIAGNOSIS  Overweight/obesity ([place]-3.3) related to past poor dietary habits and physical inactivity as evidenced by patient w/ planned Sleeve  surgery following dietary guidelines for continued weight loss.     NUTRITION INTERVENTION  Nutrition counseling (C-1) and education (E-2) to facilitate bariatric surgery goals.   New Pre-Op Goals to Work On  -Continue to chewing his food until applesauce consistency  - Continue Not drinking with meals      Change readiness: Ready Demonstrated degree of understanding via: Teach Back      MONITORING & EVALUATION Dietary intake, weekly physical activity, body weight, and pre-op goals.

## 2018-11-11 ENCOUNTER — Ambulatory Visit: Payer: Medicare Other | Admitting: Internal Medicine

## 2018-11-11 ENCOUNTER — Other Ambulatory Visit: Payer: Self-pay

## 2018-12-03 ENCOUNTER — Other Ambulatory Visit: Payer: Self-pay

## 2018-12-03 ENCOUNTER — Encounter: Payer: Medicare Other | Attending: Surgery | Admitting: Skilled Nursing Facility1

## 2018-12-03 DIAGNOSIS — Z6841 Body Mass Index (BMI) 40.0 and over, adult: Secondary | ICD-10-CM | POA: Insufficient documentation

## 2018-12-03 DIAGNOSIS — Z7901 Long term (current) use of anticoagulants: Secondary | ICD-10-CM | POA: Insufficient documentation

## 2018-12-03 DIAGNOSIS — E7800 Pure hypercholesterolemia, unspecified: Secondary | ICD-10-CM | POA: Insufficient documentation

## 2018-12-03 DIAGNOSIS — G4733 Obstructive sleep apnea (adult) (pediatric): Secondary | ICD-10-CM | POA: Diagnosis not present

## 2018-12-03 DIAGNOSIS — Z79899 Other long term (current) drug therapy: Secondary | ICD-10-CM | POA: Insufficient documentation

## 2018-12-03 DIAGNOSIS — I10 Essential (primary) hypertension: Secondary | ICD-10-CM | POA: Insufficient documentation

## 2018-12-03 DIAGNOSIS — E669 Obesity, unspecified: Secondary | ICD-10-CM

## 2018-12-03 DIAGNOSIS — Z713 Dietary counseling and surveillance: Secondary | ICD-10-CM | POA: Diagnosis present

## 2018-12-03 NOTE — Progress Notes (Signed)
Bariatric Supervised Weight Loss Visit Appt Start Time: 7:24  End Time: 8:00    Referral Stated SWL Appointments Required: 6: 2nd SWL  Proposed Surgery Type: Sleeve Gastrectomy   NUTRITION ASSESSMENT   Anthropometrics  Start weight at [facility]: 346.7 lbs (date: 10/03/2018) Today's weight: 347.8 lbs Weight change: -2 lbs (since previous nutrition appointment)  BMI: 44.65 kg/m2    Clinical  Medical Hx: obesity, HTN, hypercholesterolemia, sleep apnea, COPD Surgeries: bilateral knee replacements (2008)  Medications: rosuvastatin, Xarelto, sertraline, amlodipine valsartan, mometasone furoate, metoprolol succinate ER, Tylenol, latanoprost, methocarbamol, Allegra, airborne   Psychosocial/Lifestyle  Pt expectation of surgery: to weigh ~220 lbs; ultimately to qualify for hip replacement; to relieve high blood pressure, high cholesterol, sleep apnea  Pt states he has been: Instread of drinking and smoking I have been eating. Pt states he stopped smoking in 2003.   Pt states he has not had a alcoholic drink in 3 months: pt states he has a past hx of alcoholism for about 5-10 years.   Pt states he has switched from alcohol being his vice to food being his vice and recognizes making changes after 68 years of doing something will be difficult.   Pt states he had a kidney stone so it is not cancer but now they found a  Spot on his lung stating he thinks this is from pneumonia from when he was a baby. Pt states he has been working on chewing well, cutting down on drinking with a meal, trying oatmeal for breakfast, and smaller portions (for example 1 sandwich instead of 2). Pt states his surgery is scheduled for October. Pt states he really wants to play golf again.   Labs:  Notable Signs/Symptoms Hip pain  24-Hr Dietary Recall First Meal: tomato sandwich or oatmeal Snack: cubes of cheese Second Meal: tomato sandwich sometimes with chips and a banana  Snack: apple or granola bar Handful of  chips Third Meal: salad with grilled chicken or ham and eggs Snack: banana  Beverages: 8-10 bottles water, sweet drink   Physical Activity  ADL's  Percent Successful in meeting last appointments goals:  45% (as stated by pt)   Pre-Op Goals Previously Chosen  - chewing his food until applesauce consistency  - Not drinking with meals    Estimated Energy Needs Calories: 1800 Carbohydrate: 200 Protein: 135 Fat: 50     NUTRITION DIAGNOSIS  Overweight/obesity ([place]-3.3) related to past poor dietary habits and physical inactivity as evidenced by patient w/ planned Sleeve  surgery following dietary guidelines for continued weight loss.     NUTRITION INTERVENTION  Nutrition counseling (C-1) and education (E-2) to facilitate bariatric surgery goals.   New Pre-Op Goals to Work On  -Continue to chewing his food until applesauce consistency  - Continue Not drinking with meals -Decrease your portions  -Start riding your bike: Every other day    Change readiness: Ready Demonstrated degree of understanding via: Teach Back      MONITORING & EVALUATION Dietary intake, weekly physical activity, body weight, and pre-op goals.

## 2018-12-05 ENCOUNTER — Ambulatory Visit (INDEPENDENT_AMBULATORY_CARE_PROVIDER_SITE_OTHER): Payer: Medicare Other | Admitting: Family Medicine

## 2018-12-05 ENCOUNTER — Encounter: Payer: Self-pay | Admitting: Family Medicine

## 2018-12-05 ENCOUNTER — Other Ambulatory Visit: Payer: Self-pay

## 2018-12-05 VITALS — BP 122/74 | HR 63 | Temp 97.9°F | Ht 74.0 in | Wt 348.2 lb

## 2018-12-05 DIAGNOSIS — G4733 Obstructive sleep apnea (adult) (pediatric): Secondary | ICD-10-CM

## 2018-12-05 DIAGNOSIS — I4819 Other persistent atrial fibrillation: Secondary | ICD-10-CM

## 2018-12-05 DIAGNOSIS — F325 Major depressive disorder, single episode, in full remission: Secondary | ICD-10-CM

## 2018-12-05 DIAGNOSIS — E785 Hyperlipidemia, unspecified: Secondary | ICD-10-CM

## 2018-12-05 DIAGNOSIS — M1990 Unspecified osteoarthritis, unspecified site: Secondary | ICD-10-CM

## 2018-12-05 DIAGNOSIS — N529 Male erectile dysfunction, unspecified: Secondary | ICD-10-CM

## 2018-12-05 DIAGNOSIS — I10 Essential (primary) hypertension: Secondary | ICD-10-CM

## 2018-12-05 DIAGNOSIS — Z9989 Dependence on other enabling machines and devices: Secondary | ICD-10-CM

## 2018-12-05 DIAGNOSIS — M109 Gout, unspecified: Secondary | ICD-10-CM

## 2018-12-05 DIAGNOSIS — J302 Other seasonal allergic rhinitis: Secondary | ICD-10-CM

## 2018-12-05 DIAGNOSIS — Z6841 Body Mass Index (BMI) 40.0 and over, adult: Secondary | ICD-10-CM

## 2018-12-05 MED ORDER — TADALAFIL 10 MG PO TABS: 10.0000 mg | ORAL_TABLET | Freq: Every day | ORAL | 0 refills | Status: DC | PRN

## 2018-12-05 NOTE — Assessment & Plan Note (Signed)
Start Cialis 10 mg daily as needed.  Discussed potential side effects.

## 2018-12-05 NOTE — Assessment & Plan Note (Signed)
Continue Crestor 20 mg daily.  Check lipid panel next blood draw.

## 2018-12-05 NOTE — Patient Instructions (Signed)
It was very nice to see you today!  Try taking the cialis.  No other changes today.  Come back to see me in 6 months, or sooner as needed.   Take care, Dr Jerline Pain  Please try these tips to maintain a healthy lifestyle:   Eat at least 3 REAL meals and 1-2 snacks per day.  Aim for no more than 5 hours between eating.  If you eat breakfast, please do so within one hour of getting up.    Obtain twice as many fruits/vegetables as protein or carbohydrate foods for both lunch and dinner. (Half of each meal should be fruits/vegetables, one quarter protein, and one quarter starchy carbs)   Cut down on sweet beverages. This includes juice, soda, and sweet tea.    Exercise at least 150 minutes every week.

## 2018-12-05 NOTE — Assessment & Plan Note (Signed)
At goal.  Continue amlodipine-valsartan-HCTZ 5-160-12.5 once daily and metoprolol succinate 50 mg daily.

## 2018-12-05 NOTE — Progress Notes (Signed)
Chief Complaint:  Isaiah Jones is a 68 y.o. male who presents today with a chief complaint of erectile dysfunction and to establish care.   Assessment/Plan:  Erectile dysfunction Start Cialis 10 mg daily as needed.  Discussed potential side effects.  Essential hypertension At goal.  Continue amlodipine-valsartan-HCTZ 5-160-12.5 once daily and metoprolol succinate 50 mg daily.  Persistent atrial fibrillation (HCC) Stable.  Continue management per cardiology.  Anticoagulated on Xarelto and rate controlled with metoprolol.  Dyslipidemia Continue Crestor 20 mg daily.  Check lipid panel next blood draw.  Gout Continue indomethacin as needed.  Continue diet modifications.  Osteoarthritis Continue management per orthopedics.  He will continue taking Robaxin as needed for his back pain.  Seasonal allergies Stable.  Continue allegra and Nasonex.  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  Continue Zoloft 50 mg daily.  OSA on CPAP Stable.   Morbid obesity ([place]) Continue management per bariatrics.  Currently under consideration for surgical intervention.      Subjective:  HPI:  Erectile Dysfunction Started 14 years ago.  Has been trying to manage it without medications however is not been successful.  No reported penile discharge or dysuria.  Has trouble getting and maintaining erections.   His chronic medical conditions are outlined below:  # Essential Hypertension - On amlodipine-valsartan-HCTZ 5-160-12.5mg  tablet once daily and metoprolol succinate 50mg  daily - ROS: No reported chest pain or shortness of breath  # Atrial Fibrillation - Follows with cardiology - On metoprolol succinate 25mg  daily and tolerating well - Anticoagulated on xarelto 20mg  daily and tolerating well  # Dyslipidemia - On crestor 20mg  daily and tolerating well - ROS: No reported myalgias  # Gout - Diet controlled -Takes indomethacin as needed  # Chronic Back Pain /  Osteoarthritis s/p bilateral knee TKA - Follows with orthopedics - Takes robaxin as needed  # Seasonal Allerigies -Takes over-the-counter Allegra and nasonex  # Depression / Anxiety - On zoloft 50mg  daily and tolerating well - ROS: No reported SI or HI  # OSA on CPAP  # Morbid Obeisty - Seeing bariatric specialist  ROS: Per HPI, otherwise a complete review of systems was negative.   PMH:  The following were reviewed and entered/updated in epic: Past Medical History:  Diagnosis Date  . Arthritis, degenerative   . 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 CHF (congestive heart failure) ([place])   . Colon polyps   . Colon polyps   . Dyslipidemia   . Glaucoma   . Hyperlipidemia   . Hypertension   . OSA on CPAP   . Persistent atrial fibrillation    Echo 3/18: Mild LVH, EF 55-60, normal wall motion, grade 1 diastolic dysfunction, mild LAE  . Rhinitis, allergic    Patient Active Problem List   Diagnosis Date Noted  . Erectile dysfunction 12/05/2018  . Essential hypertension 12/05/2018  . Gout 12/05/2018  . Osteoarthritis 12/05/2018  . Seasonal allergies 12/05/2018  . 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 Morbid obesity ([place]) 12/05/2018  . Persistent atrial fibrillation   . OSA on CPAP   . Dyslipidemia    Past Surgical History:  Procedure Laterality Date  . arthroscopic knee surgery Bilateral 2003  . CARDIOVERSION N/A 08/10/2016   Procedure: CARDIOVERSION;  Surgeon: Pixie Casino, MD;  [facility] ENDOSCOPY;  Service: Cardiovascular;  Laterality: N/A;  . REPLACEMENT TOTAL KNEE Bilateral 2008  . TONSILLECTOMY    . VASECTOMY  Family History  Problem Relation Age of Onset  . Alzheimer's disease Mother   . Heart attack Mother   . Hyperlipidemia Mother   . Hypertension Mother   . Arthritis Mother   . Heart attack Father 20  . Arthritis Father   . Hyperlipidemia Father   . Hypertension Father   .  Hypertension Brother   . Hyperlipidemia Brother   . Other Brother        [place]  . Atrial fibrillation Neg Hx     Medications- reviewed and updated Current Outpatient Medications  Medication Sig Dispense Refill  . acetaminophen (TYLENOL) 500 MG tablet Take 500 mg by mouth every 6 (six) hours as needed.    . Amlodipine-Valsartan-HCTZ 5-160-12.5 MG TABS Take 1 tablet by mouth daily.     Marland Kitchen amoxicillin (AMOXIL) 500 MG capsule     . diphenhydramine-acetaminophen (TYLENOL PM) 25-500 MG TABS tablet Take 1 tablet by mouth at bedtime as needed.    . fexofenadine (ALLEGRA) 180 MG tablet Take 180 mg by mouth daily as needed for allergies or rhinitis.    . Ginkgo Biloba (GINKOBA) 40 MG TABS Take 120 mg by mouth daily.     . indomethacin (INDOCIN) 50 MG capsule     . latanoprost (XALATAN) 0.005 % ophthalmic solution Place 1 drop into both eyes daily.    . methocarbamol (ROBAXIN) 750 MG tablet Take 750 mg by mouth as needed.    . metoprolol succinate (TOPROL-XL) 50 MG 24 hr tablet TAKE 1 TABLET DAILY . TAKE WITH OR IMMEDIATELY        FOLLOWING A MEAL. 90 tablet 2  . mometasone (NASONEX) 50 MCG/ACT nasal spray Place 2 sprays into the nose daily.    . Multiple Vitamin (MULTI-DAY PO) Take 1 tablet by mouth daily.     . Multiple Vitamins-Minerals (AIRBORNE PO) Take 1 tablet by mouth daily.     . OMEGA-3 KRILL OIL PO Take 1 tablet by mouth daily.     Marland Kitchen PENNSAID 2 % SOLN Apply 1-2 Pump topically as needed.  1  . rivaroxaban (XARELTO) 20 MG TABS tablet TAKE 1 TABLET DAILY WITH   SUPPER 90 tablet 2  . rosuvastatin (CRESTOR) 20 MG tablet Take 20 mg by mouth daily.    . sertraline (ZOLOFT) 50 MG tablet Take 50 mg by mouth daily.     Marland Kitchen thiamine (VITAMIN B-1) 100 MG tablet Take 100 mg by mouth daily.    . tadalafil (CIALIS) 10 MG tablet Take 1 tablet (10 mg total) by mouth daily as needed for erectile dysfunction. 30 tablet 0   No current facility-administered medications for this visit.      Allergies-reviewed and updated No Known Allergies  Social History   Socioeconomic History  . Marital status: Married    Spouse name: Not on file  . Number of children: Not on file  . Years of education: Not on file  . Highest education level: Not on file  Occupational History  . Not on file  Social Needs  . Financial resource strain: Not on file  . Food insecurity    Worry: Not on file    Inability: Not on file  . Transportation needs    Medical: Not on file    Non-medical: Not on file  Tobacco Use  . Smoking status: Former Smoker    Types: Cigarettes    Quit date: 07/04/2001    Years since quitting: 17.4  . Smokeless tobacco: Never Used  Substance  and Sexual Activity  . Alcohol use: Yes    Comment: OCCASIONALLY  . Drug use: No  . Sexual activity: Yes    Comment: MARRIED  Lifestyle  . Physical activity    Days per week: Not on file    Minutes per session: Not on file  . Stress: Not on file  Relationships  . Social Herbalist on phone: Not on file    Gets together: Not on file    Attends religious service: Not on file    Active member of club or organization: Not on file    Attends meetings of clubs or organizations: Not on file    Relationship status: Not on file  Other Topics Concern  . Not on file  Social History Narrative   Lives in [place], [place]   Married   2 kids   Retired - Prior worked at [employer]        Objective:  Physical Exam: BP 122/74   Pulse 63   Temp 97.9 F (36.6 C) (Oral)   Ht 6\' 2"  (1.88 m)   Wt (!) 348 lb 4 oz (158 kg)   SpO2 95%   BMI 44.71 kg/m   Gen: NAD, resting comfortably CV: Regular with no murmurs appreciated Pulm: Normal work of breathing, clear to auscultation bilaterally with no crackles, wheezes, or rhonchi GI: Normal bowel sounds present. Soft, Nontender, Nondistended. MSK: No edema, cyanosis, or clubbing noted Skin: Warm, dry Neuro: Grossly normal, moves all extremities Psych: Normal affect and thought  content      Milla Wahlberg M. Jerline Pain, MD 12/05/2018 3:05 PM

## 2018-12-05 NOTE — Assessment & Plan Note (Signed)
Stable.  Continue Zoloft 50 mg daily. 

## 2018-12-05 NOTE — Assessment & Plan Note (Signed)
Stable.  Continue management per cardiology.  Anticoagulated on Xarelto and rate controlled with metoprolol.

## 2018-12-05 NOTE — Assessment & Plan Note (Signed)
Stable

## 2018-12-05 NOTE — Assessment & Plan Note (Addendum)
Continue management per bariatrics.  Currently under consideration for surgical intervention.

## 2018-12-05 NOTE — Assessment & Plan Note (Signed)
Continue management per orthopedics.  He will continue taking Robaxin as needed for his back pain.

## 2018-12-05 NOTE — Assessment & Plan Note (Signed)
Continue indomethacin as needed.  Continue diet modifications.

## 2018-12-05 NOTE — Assessment & Plan Note (Addendum)
Stable.  Continue allegra and Nasonex.

## 2018-12-23 ENCOUNTER — Telehealth: Payer: Self-pay | Admitting: Family Medicine

## 2018-12-23 NOTE — Telephone Encounter (Signed)
Rec'd from [place] forwarded 34 pages to [employer]

## 2019-01-01 ENCOUNTER — Encounter: Payer: Self-pay | Admitting: Family Medicine

## 2019-01-07 ENCOUNTER — Other Ambulatory Visit: Payer: Self-pay

## 2019-01-07 ENCOUNTER — Encounter: Payer: Medicare Other | Attending: Surgery | Admitting: Skilled Nursing Facility1

## 2019-01-07 ENCOUNTER — Ambulatory Visit (INDEPENDENT_AMBULATORY_CARE_PROVIDER_SITE_OTHER): Payer: Medicare Other | Admitting: Psychology

## 2019-01-07 ENCOUNTER — Ambulatory Visit: Payer: Medicare Other | Admitting: Psychology

## 2019-01-07 DIAGNOSIS — I10 Essential (primary) hypertension: Secondary | ICD-10-CM | POA: Insufficient documentation

## 2019-01-07 DIAGNOSIS — Z6841 Body Mass Index (BMI) 40.0 and over, adult: Secondary | ICD-10-CM | POA: Insufficient documentation

## 2019-01-07 DIAGNOSIS — Z79899 Other long term (current) drug therapy: Secondary | ICD-10-CM | POA: Diagnosis not present

## 2019-01-07 DIAGNOSIS — E669 Obesity, unspecified: Secondary | ICD-10-CM

## 2019-01-07 DIAGNOSIS — Z7901 Long term (current) use of anticoagulants: Secondary | ICD-10-CM | POA: Diagnosis not present

## 2019-01-07 DIAGNOSIS — G4733 Obstructive sleep apnea (adult) (pediatric): Secondary | ICD-10-CM | POA: Insufficient documentation

## 2019-01-07 DIAGNOSIS — E7800 Pure hypercholesterolemia, unspecified: Secondary | ICD-10-CM | POA: Insufficient documentation

## 2019-01-07 DIAGNOSIS — Z713 Dietary counseling and surveillance: Secondary | ICD-10-CM | POA: Diagnosis not present

## 2019-01-07 DIAGNOSIS — F509 Eating disorder, unspecified: Secondary | ICD-10-CM

## 2019-01-07 NOTE — Patient Instructions (Signed)
-  Aim for 4-5 ounces of meat per meal  -4-5 ounces of carbohydrate (potatoes, corn, peas, fruit, etc) without a bread serving   -8-10 ounces of non starchy vegetables (broccoli, green beans, tomato, cucumber, etc)  -All of these are per meal    -If you drink a slim fast drink that will be either the full meal or a snack

## 2019-01-07 NOTE — Progress Notes (Signed)
Bariatric Supervised Weight Loss Visit Appt Start Time: 7:24  End Time: 8:00    Referral Stated SWL Appointments Required: 6: 3rd SWL  Proposed Surgery Type: Sleeve Gastrectomy   NUTRITION ASSESSMENT   Anthropometrics  Start weight at [facility]: 346.7 lbs (date: 10/03/2018) Today's weight: 357.1 lbs Weight change: +10 lbs (since previous nutrition appointment)  BMI: 45.84 kg/m2    Clinical  Medical Hx: obesity, HTN, hypercholesterolemia, sleep apnea, COPD Surgeries: bilateral knee replacements (2008)  Medications: rosuvastatin, Xarelto, sertraline, amlodipine valsartan, mometasone furoate, metoprolol succinate ER, Tylenol, latanoprost, methocarbamol, Allegra, airborne   Psychosocial/Lifestyle  Pt expectation of surgery: to weigh ~220 lbs; ultimately to qualify for hip replacement; to relieve high blood pressure, high cholesterol, sleep apnea  Pt states he has been: Instread of drinking and smoking I have been eating. Pt states he stopped smoking in 2003.   Pt states he has not had a alcoholic drink in 3 months: pt states he has a past hx of alcoholism for about 5-10 years.   Pt states he has switched from alcohol being his vice to food being his vice and recognizes making changes after 69 years of doing something will be difficult.   Pt state she has a lot of family members that are sick so his schedules have been hectic. Pt states he is doing really well with chewing until applesauce consistency. Pt state she wants to track what he eats and measure his food.  Pt states he has to talk to the psychologist today.   Labs:  Notable Signs/Symptoms Hip pain  24-Hr Dietary Recall First Meal: tomato sandwich or oatmeal Snack: cubes of cheese Second Meal: tomato sandwich sometimes with chips and a banana  Snack: apple or granola bar Handful of chips Third Meal: salad with grilled chicken or ham and eggs Snack: banana  Beverages: 8-10 bottles water, sweet drink   Physical Activity   ADL's  Percent Successful in meeting last appointments goals:  45% (as stated by pt)   Pre-Op Goals Previously Chosen -Continue to chewing his food until applesauce consistency  - Continue Not drinking with meals -Decrease your portions  -Start riding your bike: Every other day  Estimated Energy Needs Calories: 1800 Carbohydrate: 200 Protein: 135 Fat: 50     NUTRITION DIAGNOSIS  Overweight/obesity ([place]-3.3) related to past poor dietary habits and physical inactivity as evidenced by patient w/ planned Sleeve  surgery following dietary guidelines for continued weight loss.     NUTRITION INTERVENTION  Nutrition counseling (C-1) and education (E-2) to facilitate bariatric surgery goals.   New Pre-Op Goals to Work On  -Aim for 4-5 ounces of meat per meal -4-5 ounces of carbohydrate (potatoes, corn, peas, fruit, etc) without a bread serving  -8-10 ounces of non starchy vegetables (broccoli, green beans, tomato, cucumber, etc) -All of these are per meal  -If you drink a slim fast drink that will be either the full meal or a snack     Change readiness: Ready Demonstrated degree of understanding via: Teach Back      MONITORING & EVALUATION Dietary intake, weekly physical activity, body weight, and pre-op goals.

## 2019-01-15 ENCOUNTER — Other Ambulatory Visit: Payer: Self-pay | Admitting: Family Medicine

## 2019-01-15 ENCOUNTER — Other Ambulatory Visit: Payer: Self-pay | Admitting: Interventional Cardiology

## 2019-01-15 NOTE — Telephone Encounter (Signed)
Medications:  Amlodipine-Valsartan-HCTZ 5-160-12.5 MG TABS  sertraline (ZOLOFT) 50 MG tablet  rosuvastatin (CRESTOR) 20 MG tablet     Patient is requesting refills.    [facility])  Phone# [phone]

## 2019-01-15 NOTE — Telephone Encounter (Signed)
Requested medication (s) are due for refill today: yes  Requested medication (s) are on the active medication list: yes  Last refill:  06/13/2016  Future visit scheduled: yes  Notes to clinic:  Review for refill   Requested Prescriptions  Pending Prescriptions Disp Refills   amLODIPine-Valsartan-HCTZ 5-160-12.5 MG TABS 30 tablet     Sig: Take 1 tablet by mouth daily.     There is no refill protocol information for this order     sertraline (ZOLOFT) 50 MG tablet       Sig: Take 1 tablet (50 mg total) by mouth daily.     Psychiatry:  Antidepressants - SSRI Passed - 01/15/2019 12:11 PM      Passed - Valid encounter within last 6 months    Recent Outpatient Visits          1 month ago Dyslipidemia   [place], Algis Greenhouse, MD      Future Appointments            In 1 month Irish Lack, Charlann Lange, MD [place], [facility]   In 4 months Jerline Pain, Algis Greenhouse, MD [place], [place] - Completed PHQ-2 or PHQ-9 in the last 360 days.       rosuvastatin (CRESTOR) 20 MG tablet       Sig: Take 1 tablet (20 mg total) by mouth daily.     Cardiovascular:  Antilipid - Statins Failed - 01/15/2019 12:11 PM      Failed - Total Cholesterol in normal range and within 360 days    No results found for: CHOL, POCCHOL       Failed - LDL in normal range and within 360 days    No results found for: LDLCALC, LDLC, HIRISKLDL       Failed - HDL in normal range and within 360 days    No results found for: HDL       Failed - Triglycerides in normal range and within 360 days    No results found for: TRIG       Passed - Patient is not pregnant      Passed - Valid encounter within last 12 months    Recent Outpatient Visits          1 month ago Dyslipidemia   [place] Parker, Algis Greenhouse, MD      Future Appointments            In 1 month [place], Charlann Lange, MD [place], [facility]   In 4 months Jerline Pain, Algis Greenhouse, MD [place], [place]

## 2019-01-15 NOTE — Telephone Encounter (Signed)
See note

## 2019-01-16 MED ORDER — SERTRALINE HCL 50 MG PO TABS: 50.0000 mg | ORAL_TABLET | Freq: Every day | ORAL | 0 refills | Status: DC

## 2019-01-16 MED ORDER — AMLODIPINE-VALSARTAN-HCTZ 5-160-12.5 MG PO TABS: 1.0000 | ORAL_TABLET | Freq: Every day | ORAL | 3 refills | Status: DC

## 2019-01-16 MED ORDER — ROSUVASTATIN CALCIUM 20 MG PO TABS: 20.0000 mg | ORAL_TABLET | Freq: Every day | ORAL | 3 refills | Status: DC

## 2019-01-16 NOTE — Telephone Encounter (Signed)
Prescription refill request for Xarelto received.   Last office visit: 02-04-2018,  Irish Lack Weight: 158 kg Age: 68 y.o. Scr: 0.7, 10-17-2018 CrCl: 223 ml/min  Prescription refill sent.

## 2019-01-17 ENCOUNTER — Telehealth: Payer: Self-pay | Admitting: Physical Therapy

## 2019-01-17 NOTE — Telephone Encounter (Signed)
Copied from [place] [phone]. Topic: General - Other >> Jan 17, 2019  8:50 AM Leward Quan A wrote: Reason for CRM: Patient called to ask if Dr Jerline Pain can please update his Rx with [facility] to 90 day supplies please. amLODIPine-Valsartan-HCTZ 5-160-12.5 MG TABS, metoprolol succinate (TOPROL-XL) 50 MG 24 hr tablet, rosuvastatin (CRESTOR) 20 MG tablet, XARELTO 20 MG TABS tablet

## 2019-01-17 NOTE — Telephone Encounter (Signed)
Patient is calling back he believe [facility].  See below for the contact- [facility] [address] [facility] [address] [place] [address]  Phone: [phone] Fax: [phone]  Not a 24 hour pharmacy; exact hours not known.   He states that is [facility] but [facility] comes [facility]. Thanks

## 2019-01-17 NOTE — Telephone Encounter (Signed)
Left voice message for patient to call clinic to clarify pharmacy.

## 2019-01-20 ENCOUNTER — Ambulatory Visit: Payer: Medicare Other | Admitting: Psychology

## 2019-01-20 ENCOUNTER — Other Ambulatory Visit: Payer: Self-pay

## 2019-01-20 DIAGNOSIS — F509 Eating disorder, unspecified: Secondary | ICD-10-CM | POA: Diagnosis not present

## 2019-01-20 MED ORDER — ROSUVASTATIN CALCIUM 20 MG PO TABS: 20.0000 mg | ORAL_TABLET | Freq: Every day | ORAL | 3 refills | Status: DC

## 2019-01-20 MED ORDER — RIVAROXABAN 20 MG PO TABS: ORAL_TABLET | ORAL | 3 refills | Status: DC

## 2019-01-20 MED ORDER — AMLODIPINE-VALSARTAN-HCTZ 5-160-12.5 MG PO TABS: 1.0000 | ORAL_TABLET | Freq: Every day | ORAL | 3 refills | Status: DC

## 2019-01-20 MED ORDER — METOPROLOL SUCCINATE ER 50 MG PO TB24: ORAL_TABLET | ORAL | 3 refills | Status: DC

## 2019-01-20 NOTE — Telephone Encounter (Signed)
Medications have been [facility].

## 2019-02-05 ENCOUNTER — Other Ambulatory Visit: Payer: Self-pay

## 2019-02-05 ENCOUNTER — Encounter: Payer: Medicare Other | Attending: Surgery | Admitting: Dietician

## 2019-02-05 ENCOUNTER — Encounter: Payer: Self-pay | Admitting: Dietician

## 2019-02-05 DIAGNOSIS — Z7901 Long term (current) use of anticoagulants: Secondary | ICD-10-CM | POA: Diagnosis not present

## 2019-02-05 DIAGNOSIS — Z713 Dietary counseling and surveillance: Secondary | ICD-10-CM | POA: Insufficient documentation

## 2019-02-05 DIAGNOSIS — Z79899 Other long term (current) drug therapy: Secondary | ICD-10-CM | POA: Diagnosis not present

## 2019-02-05 DIAGNOSIS — I10 Essential (primary) hypertension: Secondary | ICD-10-CM | POA: Insufficient documentation

## 2019-02-05 DIAGNOSIS — G4733 Obstructive sleep apnea (adult) (pediatric): Secondary | ICD-10-CM | POA: Insufficient documentation

## 2019-02-05 DIAGNOSIS — Z6841 Body Mass Index (BMI) 40.0 and over, adult: Secondary | ICD-10-CM | POA: Insufficient documentation

## 2019-02-05 DIAGNOSIS — E7800 Pure hypercholesterolemia, unspecified: Secondary | ICD-10-CM | POA: Insufficient documentation

## 2019-02-05 DIAGNOSIS — E669 Obesity, unspecified: Secondary | ICD-10-CM

## 2019-02-05 NOTE — Progress Notes (Signed)
Bariatric Supervised Weight Loss Visit Appt Start Time: 7:35am   End Time: 7:55am  This visit was completed via telephone due to the COVID-19 pandemic.   I spoke with Laurence Spates and verified that I was speaking with the correct person with two patient identifiers (full name and date of birth).   I discussed the limitations related to this kind of visit and the patient is willing to proceed.  4th SWL  Referral Stated SWL Appointments Required: 6  Proposed Surgery Type: Sleeve Gastrectomy    NUTRITION ASSESSMENT   Anthropometrics  Start weight at [facility]: 346.7 lbs (date: 10/03/2018) Today's weight: 348 lbs (pt reported) Weight change: -9 lbs (since previous nutrition appointment)  BMI: 44.7 kg/m2    Clinical  Medical Hx: obesity, HTN, hypercholesterolemia, sleep apnea, COPD Surgeries: bilateral knee replacements (2008)  Medications: rosuvastatin, Xarelto, sertraline, amlodipine valsartan, mometasone furoate, metoprolol succinate ER, Tylenol, latanoprost, methocarbamol, Allegra, airborne Notable Signs/Symptoms: hip pain  Lifestyle & Dietary Hx Pt expectation of surgery: to weigh ~220 lbs; ultimately to qualify for hip replacement; to relieve high blood pressure, high cholesterol, sleep apnea  Pt states he has been: Instread of drinking and smoking I have been eating. Pt states he stopped smoking in 2003.   Pt states he is doing really well with chewing until applesauce consistency. Pt has not been weighing food because his scale broke. Staying away from fried food completely. Working on not drinking fluids with meals. Asked if the following were appropriate after surgery: chicken noodle soup, tea sweetened with Equal, coffee with sugar-free flavored creamer.   Physical activity includes ADLs.   24-Hr Dietary Recall First Meal: granola bar + Slim Fast  Snack: cubes of cheese Second Meal: tomato sandwich sometimes with chips and a banana  Snack: Slim Fast  Third Meal: ribeye +  tossed salad (or pork chops + salad)  Snack: banana  Beverages: 8-10 bottles water, sweet drink   Estimated Energy Needs Calories: 1800 Carbohydrate: 200 Protein: 135 Fat: 50     NUTRITION DIAGNOSIS  Overweight/obesity ([place]-3.3) related to past poor dietary habits and physical inactivity as evidenced by patient w/ planned Sleeve Gastrectomy surgery following dietary guidelines for continued weight loss.    NUTRITION INTERVENTION  Nutrition counseling (C-1) and education (E-2) to facilitate bariatric surgery goals.   Pre-Op Goal Updates - Continue chewing food until applesauce consistency  - Continue not drinking with meals - Start riding your bike: every other day - If weigh food:   - Aim for 4-5 ounces of meat per meal  - 4-5 ounces of carbohydrate (potatoes, corn, peas, fruit, etc)   - 8-10 ounces of non starchy vegetables  - If drink a slim fast drink, that will be either the full meal or a snack    Change readiness: Ready Demonstrated degree of understanding via: Teach Back      MONITORING & EVALUATION Dietary intake, weekly physical activity, body weight, and pre-op goals in 1 month.   Patient is to return to [facility] in 1 month for 5th SWL Visit.

## 2019-02-13 ENCOUNTER — Telehealth: Payer: Self-pay | Admitting: Interventional Cardiology

## 2019-02-13 NOTE — Telephone Encounter (Signed)
New Message:     Pt called and wanted to know if it would be alright for his wife to come in with him for his appt on 02-18-19 with Dr Irish Lack?

## 2019-02-14 NOTE — Telephone Encounter (Signed)
Called and made patient aware that right now due to covid we are no permitting visitors unless there is a cognitive impairment. Patient verbalized understanding and will let us know if he wants [place] to call his wife during the visit.

## 2019-02-16 NOTE — Progress Notes (Signed)
Cardiology Office Note   Date:  02/18/2019   ID:  Mayfield, Demichael June 07, 1950, MRN [id number]  PCP:  Vivi Barrack, MD    No chief complaint on file.  AFib  Wt Readings from Last 3 Encounters:  02/18/19 (!) 350 lb 9.6 oz (159 kg)  02/05/19 (!) 348 lb (157.9 kg)  01/07/19 (!) 357 lb (161.9 kg)       History of Present Illness: Isaiah Jones is a 68 y.o. male   with history of hyperlipidemia, hypertension and obesity. He also is treated for sleep apnea with CPAP. He was seen in our office back in March 2018 due to new onset atrial fibrillation. His pulmonologist found the irregular heartbeat. Atrial fibrillation was diagnosed. He has had some fatigue and intermittent palpitations over the past few months prior to diagnosis.  At his initial visit in our office in March 2018, he was started on Toprol-XL 50 mg daily for rate control, Xarelto 20 mg daily for stroke prevention. Cardioversion was considered as well. He was advised to lose weight.  Echocardiogram from March 2018 revealed: - Left ventricle: The cavity size was normal. Wall thickness was increased in a pattern of mild LVH. Systolic function was normal. The estimated ejection fraction was in the range of 55% to 60%. Wall motion was normal; there were no regional wall motion abnormalities. Doppler parameters are consistent with abnormal left ventricular relaxation (grade 1 diastolic dysfunction). - Left atrium: The atrium was mildly dilated. Volume/bsa, S: 37.4 ml/m^2.  He had his cardioversion but it was not successful. He has been maintained on rate control meds. He feels colder than he did before after starting anticoagulation.  He has had weight fluctuations.  He has some hip problems but was told he is too big for ortho surgery.  He was 375 lbs at that time.   Since the last visit, he has pursued weight loss surgery.  He thinks this will happen in 03/2019.  He walks in the yard.  He can walk  up a flight of stairs without chest pain and SHOB.      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    CHF (congestive heart failure) (HCC)    Colon polyps    Colon polyps    Dyslipidemia    Glaucoma    Hyperlipidemia    Hypertension    OSA on CPAP    Persistent atrial fibrillation ([place])    Echo 3/18: Mild LVH, EF 55-60, normal wall motion, grade 1 diastolic dysfunction, mild LAE   Rhinitis, allergic     Past Surgical History:  Procedure Laterality Date   arthroscopic knee surgery Bilateral 2003   CARDIOVERSION N/A 08/10/2016   Procedure: CARDIOVERSION;  Surgeon: Pixie Casino, MD;  Location: [facility] ENDOSCOPY;  Service: Cardiovascular;  Laterality: N/A;   REPLACEMENT TOTAL KNEE Bilateral 2008   TONSILLECTOMY     VASECTOMY       Current Outpatient Medications  Medication Sig Dispense Refill   acetaminophen (TYLENOL) 500 MG tablet Take 500 mg by mouth every 6 (six) hours as needed.     amLODIPine-Valsartan-HCTZ 5-160-12.5 MG TABS Take 1 tablet by mouth daily. 90 tablet 3   amoxicillin (AMOXIL) 500 MG capsule Take prior to dental apt.     diphenhydramine-acetaminophen (TYLENOL PM) 25-500 MG TABS tablet Take 1 tablet by mouth at bedtime as needed.     fexofenadine (  ALLEGRA) 180 MG tablet Take 180 mg by mouth daily as needed for allergies or rhinitis.     Ginkgo Biloba (GINKOBA) 40 MG TABS Take 120 mg by mouth daily.      indomethacin (INDOCIN) 50 MG capsule      latanoprost (XALATAN) 0.005 % ophthalmic solution Place 1 drop into both eyes daily.     methocarbamol (ROBAXIN) 750 MG tablet Take 750 mg by mouth as needed.     metoprolol succinate (TOPROL-XL) 50 MG 24 hr tablet Take 1 tablet daily with or immediately following a meal. ** DO NOT CRUSH **    (BETA BLOCKER). 90 tablet 3   mometasone (NASONEX) 50 MCG/ACT nasal spray Place 2 sprays into the nose daily.       Multiple Vitamin (MULTI-DAY PO) Take 1 tablet by mouth daily.      Multiple Vitamins-Minerals (AIRBORNE PO) Take 1 tablet by mouth daily.      OMEGA-3 KRILL OIL PO Take 1 tablet by mouth daily.      PENNSAID 2 % SOLN Apply 1-2 Pump topically as needed.  1   rivaroxaban (XARELTO) 20 MG TABS tablet TAKE 1 TABLET DAILY WITH   SUPPER 90 tablet 3   rosuvastatin (CRESTOR) 20 MG tablet Take 1 tablet (20 mg total) by mouth daily. 90 tablet 3   sertraline (ZOLOFT) 50 MG tablet Take 1 tablet (50 mg total) by mouth daily. 30 tablet 0   tadalafil (CIALIS) 10 MG tablet Take 1 tablet (10 mg total) by mouth daily as needed for erectile dysfunction. 30 tablet 0   thiamine (VITAMIN B-1) 100 MG tablet Take 100 mg by mouth daily.     No current facility-administered medications for this visit.     Allergies:   Patient has no known allergies.    Social History:  The patient  reports that he quit smoking about 17 years ago. His smoking use included cigarettes. He has never used smokeless tobacco. He reports current alcohol use. He reports that he does not use drugs.   Family History:  The patient's family history includes Alzheimer's disease in his mother; Arthritis in his father and mother; Heart attack in his mother; Heart attack (age of onset: 25) in his father; Hyperlipidemia in his brother, father, and mother; Hypertension in his brother, father, and mother; Other in his brother.    ROS:  Please see the history of present illness.   Otherwise, review of systems are positive for hip pain.   All other systems are reviewed and negative.    PHYSICAL EXAM: VS:  BP 122/68    Pulse 61    Ht 6\' 2"  (1.88 m)    Wt (!) 350 lb 9.6 oz (159 kg)    SpO2 96%    BMI 45.01 kg/m  , BMI Body mass index is 45.01 kg/m. GEN: Well nourished, well developed, in no acute distress  HEENT: normal  Neck: no JVD, carotid bruits, or masses Cardiac: irregularly irregular; no murmurs, rubs, or gallops,no edema   Respiratory:  clear to auscultation bilaterally, normal work of breathing GI: soft, nontender, nondistended, + BS MS: no deformity or atrophy , varicose veins on the legs Skin: warm and dry, no rash Neuro:  Strength and sensation are intact Psych: euthymic mood, full affect   EKG:   The ekg ordered June 2020 demonstrates AFib, rate controlled   Recent Labs: 10/17/2018: BUN 15; Creatinine 0.7; Hemoglobin 15.3; Platelets 188; Potassium 4.2; Sodium 142   Lipid Panel No results  found for: CHOL, TRIG, HDL, CHOLHDL, VLDL, LDLCALC, LDLDIRECT   Other studies Reviewed: Additional studies/ records that were reviewed today with results demonstrating: prior ECG reviewed.   ASSESSMENT AND PLAN:  1. AFib: rate controlled. Xarelto for stroke prevention.   He asked about Celebrex.  I think acetaminophen is a better choice as a chronic medicine, given he is on Xarelto.  Could try tramadol if pain gets worse. 2. Hypertensive heart disease: The current medical regimen is effective;  continue present plan and medications. 3. Obesity: He is being worked up for weight loss surgery.  4. OSA: Continue CPAP. 5. Hyperlipidemia: Will check labs today. 6. No signs of CHF.  No angina with walking up stairs.  No further cardiac testing needed at this time- surgery planned for 03/2019 per his report.    Current medicines are reviewed at length with the patient today.  The patient concerns regarding his medicines were addressed.  The following changes have been made:  No change  Labs/ tests ordered today include:  No orders of the defined types were placed in this encounter.   Recommend 150 minutes/week of aerobic exercise Low fat, low carb, high fiber diet recommended  Disposition:   FU in 1 year   Signed, Larae Grooms, MD  02/18/2019 8:15 AM    [facility] [address], [place], [place]  [address] Phone: [phone]; Fax: [phone]

## 2019-02-18 ENCOUNTER — Other Ambulatory Visit: Payer: Self-pay

## 2019-02-18 ENCOUNTER — Other Ambulatory Visit: Payer: Self-pay | Admitting: Family Medicine

## 2019-02-18 ENCOUNTER — Encounter: Payer: Self-pay | Admitting: Interventional Cardiology

## 2019-02-18 ENCOUNTER — Ambulatory Visit: Payer: Medicare Other | Admitting: Interventional Cardiology

## 2019-02-18 VITALS — BP 122/68 | HR 61 | Ht 74.0 in | Wt 350.6 lb

## 2019-02-18 DIAGNOSIS — I4819 Other persistent atrial fibrillation: Secondary | ICD-10-CM | POA: Diagnosis not present

## 2019-02-18 DIAGNOSIS — I119 Hypertensive heart disease without heart failure: Secondary | ICD-10-CM

## 2019-02-18 DIAGNOSIS — E782 Mixed hyperlipidemia: Secondary | ICD-10-CM

## 2019-02-18 DIAGNOSIS — G4733 Obstructive sleep apnea (adult) (pediatric): Secondary | ICD-10-CM | POA: Diagnosis not present

## 2019-02-18 LAB — HEPATIC FUNCTION PANEL
ALT: 16 IU/L (ref 0–44)
AST: 21 IU/L (ref 0–40)
Albumin: 4.5 g/dL (ref 3.8–4.8)
Alkaline Phosphatase: 68 IU/L (ref 39–117)
Bilirubin Total: 0.5 mg/dL (ref 0.0–1.2)
Bilirubin, Direct: 0.14 mg/dL (ref 0.00–0.40)
Total Protein: 6.8 g/dL (ref 6.0–8.5)

## 2019-02-18 LAB — LIPID PANEL
Chol/HDL Ratio: 3.3 ratio (ref 0.0–5.0)
Cholesterol, Total: 133 mg/dL (ref 100–199)
HDL: 40 mg/dL (ref >39)
LDL Chol Calc (NIH): 63 mg/dL (ref 0–99)
Triglycerides: 177 mg/dL — ABNORMAL HIGH (ref 0–149)
VLDL Cholesterol Cal: 30 mg/dL (ref 5–40)

## 2019-02-18 MED ORDER — SERTRALINE HCL 50 MG PO TABS: 50.0000 mg | ORAL_TABLET | Freq: Every day | ORAL | 0 refills | Status: DC

## 2019-02-18 NOTE — Patient Instructions (Signed)
Medication Instructions:  Your physician recommends that you continue on your current medications as directed. Please refer to the Current Medication list given to you today.  *If you need a refill on your cardiac medications before your next appointment, please call your pharmacy*  Lab Work: TODAY: LIPIDS, LFTS  If you have labs (blood work) drawn today and your tests are completely normal, you will receive your results only by: Marland Kitchen MyChart Message (if you have MyChart) OR . A paper copy in the mail If you have any lab test that is abnormal or we need to change your treatment, we will call you to review the results.  Testing/Procedures: None ordered  Follow-Up: At [facility], you and your health needs are our priority.  As part of our continuing mission to provide you with exceptional heart care, we have created designated Provider Care Teams.  These Care Teams include your primary Cardiologist (physician) and Advanced Practice Providers (APPs -  Physician Assistants and Nurse Practitioners) who all work together to provide you with the care you need, when you need it.  Your next appointment:   12 months  The format for your next appointment:   In Person  Provider:   You may see Casandra Doffing, MD or one of the following Advanced Practice Providers on your designated Care Team:    Melina Copa, PA-C  Ermalinda Barrios, PA-C   Other Instructions

## 2019-02-18 NOTE — Telephone Encounter (Signed)
Pt called in for a refill for sertraline (ZOLOFT) 50 MG tablet -- 90 day supply.   [facility]  [facility] [place] [facility] [address] [phone] (Phone) [phone] (Fax)

## 2019-03-05 ENCOUNTER — Encounter: Payer: Medicare Other | Attending: Surgery | Admitting: Dietician

## 2019-03-05 ENCOUNTER — Encounter: Payer: Self-pay | Admitting: Dietician

## 2019-03-05 ENCOUNTER — Other Ambulatory Visit: Payer: Self-pay

## 2019-03-05 DIAGNOSIS — Z7901 Long term (current) use of anticoagulants: Secondary | ICD-10-CM | POA: Insufficient documentation

## 2019-03-05 DIAGNOSIS — G4733 Obstructive sleep apnea (adult) (pediatric): Secondary | ICD-10-CM | POA: Insufficient documentation

## 2019-03-05 DIAGNOSIS — I10 Essential (primary) hypertension: Secondary | ICD-10-CM | POA: Diagnosis not present

## 2019-03-05 DIAGNOSIS — Z79899 Other long term (current) drug therapy: Secondary | ICD-10-CM | POA: Insufficient documentation

## 2019-03-05 DIAGNOSIS — Z6841 Body Mass Index (BMI) 40.0 and over, adult: Secondary | ICD-10-CM | POA: Diagnosis not present

## 2019-03-05 DIAGNOSIS — Z713 Dietary counseling and surveillance: Secondary | ICD-10-CM | POA: Insufficient documentation

## 2019-03-05 DIAGNOSIS — E7800 Pure hypercholesterolemia, unspecified: Secondary | ICD-10-CM | POA: Insufficient documentation

## 2019-03-05 DIAGNOSIS — E669 Obesity, unspecified: Secondary | ICD-10-CM

## 2019-03-05 NOTE — Progress Notes (Signed)
Bariatric Supervised Weight Loss Visit Appt Start Time: 7:35am   End Time: 7:55am  This visit was completed via telephone due to the COVID-19 pandemic.   I spoke with Isaiah Jones and verified that I was speaking with the correct person with two patient identifiers (full name and date of birth).   I discussed the limitations related to this kind of visit and the patient is willing to proceed.  5th SWL  Referral Stated SWL Appointments Required: 6  Proposed Surgery Type: Sleeve Gastrectomy    NUTRITION ASSESSMENT   Anthropometrics  Start weight at [facility]: 346.7 lbs (date: 10/03/2018) Today's weight: 346 lbs (pt reported) Weight change: -2 lbs (since previous nutrition appointment)  BMI: 44.4 kg/m2    Clinical  Medical Hx: obesity, HTN, hypercholesterolemia, sleep apnea, COPD Surgeries: bilateral knee replacements (2008)  Medications: rosuvastatin, Xarelto, sertraline, amlodipine valsartan, mometasone furoate, metoprolol succinate ER, Tylenol, latanoprost, methocarbamol, Allegra, airborne Notable Signs/Symptoms: hip pain  Lifestyle & Dietary Hx Pt expectation of surgery: to weigh ~220 lbs; ultimately to qualify for hip replacement; to relieve high blood pressure, high cholesterol, sleep apnea  Pt states he stopped smoking in 2003.   Pt states he is continuing to do really well with chewing until applesauce consistency. Staying away from fried food completely. Working on not drinking fluids with meals.Tracking food and beverages diligently, which has helped him learn serving sizes and control his portions for certain foods such as ice cream. States he eats between 1750 and 2250 calories per day, usually stays under 2000. States that other foods eaten include a granola bar with Slim Fast shake for breakfast, or grilled chicken or a hamburger for lunch/dinner.    Physical activity includes ADLs, activity is limited d/t severe hip pain. States he tries to get up and move around as much as  possible during the day.   24-Hr Dietary Recall First Meal: Special K cereal   Snack: - Second Meal: Slim Fast  Snack: -  Third Meal: pork tenderloin + vegetables  Snack: apples + raisins + mayo  Beverages: 6-10 bottles water, sweet tea    Estimated Energy Needs Calories: 1800 Carbohydrate: 200 g Protein: 135 g Fat: 50 g     NUTRITION DIAGNOSIS  Overweight/obesity ([place]-3.3) related to past poor dietary habits and physical inactivity as evidenced by patient w/ planned Sleeve Gastrectomy surgery following dietary guidelines for continued weight loss.    NUTRITION INTERVENTION  Nutrition counseling (C-1) and education (E-2) to facilitate bariatric surgery goals.   Pre-Op Goal Updates - Continue chewing food until applesauce consistency  - Continue not drinking with meals - If weigh food:   - Aim for 4-5 ounces of meat per meal  - 4-5 ounces of carbohydrate (potatoes, corn, peas, fruit, etc)   - 8-10 ounces of non starchy vegetables  - Continue tracking food/beverage intake as desired (patient has a handle on this, we discussed how it will be a helpful tool in the future to ensure protein and fluid goals are being met)    Change readiness: Ready Demonstrated degree of understanding via: Teach Back      MONITORING & EVALUATION Dietary intake, weekly physical activity, body weight, and pre-op goals in 1 month.   Patient is to return to [facility] in 1 month for 6th and final SWL Visit.

## 2019-04-03 ENCOUNTER — Other Ambulatory Visit: Payer: Self-pay

## 2019-04-03 ENCOUNTER — Encounter: Payer: Self-pay | Admitting: Dietician

## 2019-04-03 ENCOUNTER — Encounter: Payer: Medicare Other | Attending: Surgery | Admitting: Dietician

## 2019-04-03 DIAGNOSIS — G4733 Obstructive sleep apnea (adult) (pediatric): Secondary | ICD-10-CM | POA: Insufficient documentation

## 2019-04-03 DIAGNOSIS — Z713 Dietary counseling and surveillance: Secondary | ICD-10-CM | POA: Insufficient documentation

## 2019-04-03 DIAGNOSIS — Z7901 Long term (current) use of anticoagulants: Secondary | ICD-10-CM | POA: Insufficient documentation

## 2019-04-03 DIAGNOSIS — Z6841 Body Mass Index (BMI) 40.0 and over, adult: Secondary | ICD-10-CM | POA: Insufficient documentation

## 2019-04-03 DIAGNOSIS — Z79899 Other long term (current) drug therapy: Secondary | ICD-10-CM | POA: Insufficient documentation

## 2019-04-03 DIAGNOSIS — I10 Essential (primary) hypertension: Secondary | ICD-10-CM | POA: Insufficient documentation

## 2019-04-03 DIAGNOSIS — E7800 Pure hypercholesterolemia, unspecified: Secondary | ICD-10-CM | POA: Insufficient documentation

## 2019-04-03 DIAGNOSIS — E669 Obesity, unspecified: Secondary | ICD-10-CM

## 2019-04-03 NOTE — Progress Notes (Signed)
Bariatric Supervised Weight Loss Visit Appt Start Time: 7:30am   End Time: 7:45am  This visit was completed via telephone due to the COVID-19 pandemic.   I spoke with Isaiah Jones and verified that I was speaking with the correct person with two patient identifiers (full name and date of birth).   I discussed the limitations related to this kind of visit and the patient is willing to proceed.  6th SWL  Referral Stated SWL Appointments Required: 6  Proposed Surgery Type: Sleeve Gastrectomy  Pt expectation of surgery: to weigh ~220 lbs; ultimately to qualify for hip replacement; to relieve high blood pressure, high cholesterol, sleep apnea     NUTRITION ASSESSMENT   Anthropometrics  Start weight at [facility]: 346.7 lbs (date: 10/03/2018) Today's weight: N/A (telephone visit)    Clinical  Medical Hx: obesity, HTN, hypercholesterolemia, sleep apnea, COPD Surgeries: bilateral knee replacements (2008)  Medications: rosuvastatin, Xarelto, sertraline, amlodipine valsartan, mometasone furoate, metoprolol succinate ER, Tylenol, latanoprost, methocarbamol, Allegra, airborne Notable Signs/Symptoms: hip pain  Lifestyle & Dietary Hx Pt states he stopped smoking in 2003.  Pt states he is continuing to do really well with chewing until applesauce consistency. Staying away from fried food completely. Working on not drinking fluids with meals.Tracking food and beverages diligently, states he usually stays under 2000 calories. Physical activity includes ADLs, activity is limited d/t severe hip pain. States he tries to get up and move around as much as possible during the day.   24-Hr Dietary Recall First Meal: Special K cereal   Snack: - Second Meal: Slim Fast  Snack: -  Third Meal: pork tenderloin + vegetables  Snack: apples + raisins + mayo  Beverages: 6-10 bottles water, coffee w/ sugar free creamer    Estimated Energy Needs Calories: 1800 Carbohydrate: 200 g Protein: 135 g Fat: 50 g      NUTRITION DIAGNOSIS  Overweight/obesity ([place]-3.3) related to past poor dietary habits and physical inactivity as evidenced by patient w/ planned Sleeve Gastrectomy surgery following dietary guidelines for continued weight loss.    NUTRITION INTERVENTION  Nutrition counseling (C-1) and education (E-2) to facilitate bariatric surgery goals.   Pre-Op Goal Updates - Continue chewing food until applesauce consistency  - Continue not drinking with meals - If weigh food:   - Aim for 4-5 ounces of meat per meal  - 4-5 ounces of carbohydrate (potatoes, corn, peas, fruit, etc)   - 8-10 ounces of non starchy vegetables  - Continue tracking food/beverage intake as desired (patient has a handle on this, we discussed how it will be a helpful tool in the future to ensure protein and fluid goals are being met)    Change readiness: Ready Demonstrated degree of understanding via: Teach Back      MONITORING & EVALUATION Dietary intake, weekly physical activity, body weight, and pre-op goals at next nutrition visit.   Patient is to return to [facility] for Pre-Op Class >2 weeks prior to surgery.

## 2019-04-14 ENCOUNTER — Other Ambulatory Visit: Payer: Self-pay

## 2019-04-14 ENCOUNTER — Encounter: Payer: Medicare Other | Admitting: Skilled Nursing Facility1

## 2019-04-14 DIAGNOSIS — I10 Essential (primary) hypertension: Secondary | ICD-10-CM | POA: Diagnosis not present

## 2019-04-14 DIAGNOSIS — Z7901 Long term (current) use of anticoagulants: Secondary | ICD-10-CM | POA: Diagnosis not present

## 2019-04-14 DIAGNOSIS — G4733 Obstructive sleep apnea (adult) (pediatric): Secondary | ICD-10-CM | POA: Diagnosis not present

## 2019-04-14 DIAGNOSIS — E669 Obesity, unspecified: Secondary | ICD-10-CM

## 2019-04-14 DIAGNOSIS — Z6841 Body Mass Index (BMI) 40.0 and over, adult: Secondary | ICD-10-CM | POA: Diagnosis not present

## 2019-04-14 DIAGNOSIS — E7800 Pure hypercholesterolemia, unspecified: Secondary | ICD-10-CM | POA: Diagnosis not present

## 2019-04-14 DIAGNOSIS — Z713 Dietary counseling and surveillance: Secondary | ICD-10-CM | POA: Diagnosis present

## 2019-04-14 DIAGNOSIS — Z79899 Other long term (current) drug therapy: Secondary | ICD-10-CM | POA: Diagnosis not present

## 2019-04-14 NOTE — Progress Notes (Signed)
Pre-Operative Nutrition Class:  Appt start time: 0699   End time:  1830.  Patient was seen on 04/14/2019 for Pre-Operative Bariatric Surgery Education at [facility].   Surgery date:  Surgery type: sleeve Start weight at [facility]: 346.7 Weight today: 369.6   The following the learning objectives were met by the patient during this course:  Identify Pre-Op Dietary Goals and will begin 2 weeks pre-operatively  Identify appropriate sources of fluids and proteins   State protein recommendations and appropriate sources pre and post-operatively  Identify Post-Operative Dietary Goals and will follow for 2 weeks post-operatively  Identify appropriate multivitamin and calcium sources  Describe the need for physical activity post-operatively and will follow MD recommendations  State when to call healthcare provider regarding medication questions or post-operative complications  Handouts given during class include:  Pre-Op Bariatric Surgery Diet Handout  Protein Shake Handout  Post-Op Bariatric Surgery Nutrition Handout  BELT Program Information Flyer  Support Group Information Flyer  [facility] Bariatric Supplements Price List  Follow-Up Plan: Patient will follow-up at [facility] 2 weeks post operatively for diet advancement per MD.

## 2019-04-21 ENCOUNTER — Ambulatory Visit: Payer: Medicare Other

## 2019-04-21 ENCOUNTER — Other Ambulatory Visit: Payer: Self-pay | Admitting: Interventional Cardiology

## 2019-04-23 ENCOUNTER — Ambulatory Visit: Payer: Self-pay | Admitting: Surgery

## 2019-05-13 ENCOUNTER — Telehealth: Payer: Self-pay | Admitting: Skilled Nursing Facility1

## 2019-05-13 NOTE — Telephone Encounter (Signed)
Pt states his surgery was cancelled again due to covid.  Pt had some specific questions about foods dietitian answered these questions to pts satisfaction.   Pt states he will not have surgery due to the cancellations.

## 2019-05-23 DIAGNOSIS — Z8739 Personal history of other diseases of the musculoskeletal system and connective tissue: Secondary | ICD-10-CM | POA: Insufficient documentation

## 2019-05-23 HISTORY — DX: Personal history of other diseases of the musculoskeletal system and connective tissue: Z87.39

## 2019-05-26 ENCOUNTER — Other Ambulatory Visit: Payer: Self-pay | Admitting: Family Medicine

## 2019-06-11 ENCOUNTER — Other Ambulatory Visit: Payer: Self-pay

## 2019-06-11 ENCOUNTER — Ambulatory Visit: Payer: Medicare Other | Admitting: Family Medicine

## 2019-06-11 ENCOUNTER — Encounter: Payer: Self-pay | Admitting: Family Medicine

## 2019-06-11 VITALS — BP 128/80 | HR 60 | Temp 97.0°F | Ht 74.0 in | Wt 348.5 lb

## 2019-06-11 DIAGNOSIS — Z125 Encounter for screening for malignant neoplasm of prostate: Secondary | ICD-10-CM | POA: Diagnosis not present

## 2019-06-11 DIAGNOSIS — M109 Gout, unspecified: Secondary | ICD-10-CM

## 2019-06-11 DIAGNOSIS — Z0001 Encounter for general adult medical examination with abnormal findings: Secondary | ICD-10-CM

## 2019-06-11 DIAGNOSIS — E785 Hyperlipidemia, unspecified: Secondary | ICD-10-CM

## 2019-06-11 DIAGNOSIS — F325 Major depressive disorder, single episode, in full remission: Secondary | ICD-10-CM

## 2019-06-11 DIAGNOSIS — I10 Essential (primary) hypertension: Secondary | ICD-10-CM

## 2019-06-11 DIAGNOSIS — Z1159 Encounter for screening for other viral diseases: Secondary | ICD-10-CM

## 2019-06-11 DIAGNOSIS — N529 Male erectile dysfunction, unspecified: Secondary | ICD-10-CM

## 2019-06-11 DIAGNOSIS — J302 Other seasonal allergic rhinitis: Secondary | ICD-10-CM

## 2019-06-11 DIAGNOSIS — I4819 Other persistent atrial fibrillation: Secondary | ICD-10-CM

## 2019-06-11 LAB — LIPID PANEL
Cholesterol: 145 mg/dL (ref 0–200)
HDL: 40.8 mg/dL (ref >39.00)
LDL Cholesterol: 71 mg/dL (ref 0–99)
NonHDL: 103.89
Total CHOL/HDL Ratio: 4
Triglycerides: 166 mg/dL — ABNORMAL HIGH (ref 0.0–149.0)
VLDL: 33.2 mg/dL (ref 0.0–40.0)

## 2019-06-11 LAB — IBC + FERRITIN
Ferritin: 314.9 ng/mL (ref 22.0–322.0)
Iron: 79 ug/dL (ref 42–165)
Saturation Ratios: 29.4 % (ref 20.0–50.0)
Transferrin: 192 mg/dL — ABNORMAL LOW (ref 212.0–360.0)

## 2019-06-11 LAB — COMPREHENSIVE METABOLIC PANEL
ALT: 16 U/L (ref 0–53)
AST: 17 U/L (ref 0–37)
Albumin: 4.3 g/dL (ref 3.5–5.2)
Alkaline Phosphatase: 58 U/L (ref 39–117)
BUN: 21 mg/dL (ref 6–23)
CO2: 28 mEq/L (ref 19–32)
Calcium: 9.6 mg/dL (ref 8.4–10.5)
Chloride: 103 mEq/L (ref 96–112)
Creatinine, Ser: 0.91 mg/dL (ref 0.40–1.50)
GFR: 82.42 mL/min (ref >60.00)
Glucose, Bld: 104 mg/dL — ABNORMAL HIGH (ref 70–99)
Potassium: 4 mEq/L (ref 3.5–5.1)
Sodium: 140 mEq/L (ref 135–145)
Total Bilirubin: 0.5 mg/dL (ref 0.2–1.2)
Total Protein: 6.5 g/dL (ref 6.0–8.3)

## 2019-06-11 LAB — HEMOGLOBIN A1C: Hgb A1c MFr Bld: 5.8 % (ref 4.6–6.5)

## 2019-06-11 LAB — TSH: TSH: 1.66 u[IU]/mL (ref 0.35–4.50)

## 2019-06-11 LAB — VITAMIN B12: Vitamin B-12: 426 pg/mL (ref 211–911)

## 2019-06-11 LAB — URIC ACID: Uric Acid, Serum: 9.4 mg/dL — ABNORMAL HIGH (ref 4.0–7.8)

## 2019-06-11 LAB — VITAMIN D 25 HYDROXY (VIT D DEFICIENCY, FRACTURES): VITD: 14.89 ng/mL — ABNORMAL LOW (ref 30.00–100.00)

## 2019-06-11 LAB — PSA, MEDICARE: PSA: 0.12 ng/ml (ref 0.10–4.00)

## 2019-06-11 MED ORDER — AZELASTINE HCL 0.1 % NA SOLN: 2.0000 | Freq: Two times a day (BID) | NASAL | 12 refills | Status: DC

## 2019-06-11 NOTE — Assessment & Plan Note (Signed)
Continue Cialis 10 mg daily as needed.

## 2019-06-11 NOTE — Progress Notes (Signed)
Chief Complaint:  Isaiah Jones is a 69 y.o. male who presents today for his annual comprehensive physical exam.    Assessment/Plan:  Chronic Problems Addressed Today: Morbid obesity ([place]) Continue management per bariatrics.  We will check requested labs today including C met, H. pylori, A1c, iron level, lipid panel, TSH, B1, B12, and vitamin D.  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  Continue Zoloft 50 mg daily.  Gout Stable.  Check uric acid level today.  Essential hypertension Stable.  Continue amlodipine-valsartan-HCTZ 5-160-12.5 and metoprolol succinate 50 mg daily.  Hopefully will able to come down on blood pressure medication status post bariatric surgery.  Erectile dysfunction Continue Cialis 10 mg daily as needed.  Dyslipidemia Check lipid panel.  Continue Crestor 20 mg daily.  Seasonal allergies Send in Astelin to use as needed for flares.  Continue Allegra and Nasonex.   Body mass index is 44.74 kg/m. / Obese    Preventative Healthcare: Check CBC, C met, TSH, lipid panel.  Check hep C antibody.  Patient Counseling(The following topics were reviewed and/or handout was given):  -Nutrition: Stressed importance of moderation in sodium/caffeine intake, saturated fat and cholesterol, caloric balance, sufficient intake of fresh fruits, vegetables, and fiber.  -Stressed the importance of regular exercise.   -Substance Abuse: Discussed cessation/primary prevention of tobacco, alcohol, or other drug use; driving or other dangerous activities under the influence; availability of treatment for abuse.   -Injury prevention: Discussed safety belts, safety helmets, smoke detector, smoking near bedding or upholstery.   -Sexuality: Discussed sexually transmitted diseases, partner selection, use of condoms, avoidance of unintended pregnancy and contraceptive alternatives.   -Dental health: Discussed importance of regular tooth brushing, flossing, and dental  visits.  -Health maintenance and immunizations reviewed. Please refer to Health maintenance section.  Return to care in 1 year for next preventative visit.     Subjective:  HPI:  He has no acute complaints today.   He is planning on getting bariatric surgery in about a month.  He will be getting sleeve gastrectomy.  He has several labs today that his surgeon is requesting.  Lifestyle Diet: Working on calorie restriction  Exercise: Limited due to covid and hip pain.   Depression screen PHQ 2/9 12/05/2018  Decreased Interest 0  Down, Depressed, Hopeless 0  PHQ - 2 Score 0  Altered sleeping 1  Tired, decreased energy 2  Change in appetite 2  Feeling bad or failure about yourself  0  Trouble concentrating 0  Moving slowly or fidgety/restless 1  Suicidal thoughts 0  PHQ-9 Score 6  Difficult doing work/chores Not difficult at all   Health Maintenance Due  Topic Date Due  . Hepatitis C Screening  1950/06/07     ROS: Per HPI, otherwise a complete review of systems was negative.   PMH:  The following were reviewed and entered/updated in epic: Past Medical History:  Diagnosis Date  . Arthritis, degenerative   . 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 CHF (congestive heart failure) ([place])   . Colon polyps   . Colon polyps   . Dyslipidemia   . Glaucoma   . Hyperlipidemia   . Hypertension   . OSA on CPAP   . Persistent atrial fibrillation ([place])    Echo 3/18: Mild LVH, EF 55-60, normal wall motion, grade 1 diastolic dysfunction, mild LAE  . Rhinitis, allergic    Patient Active Problem List   Diagnosis Date Noted  . Erectile dysfunction  12/05/2018  . Essential hypertension 12/05/2018  . Gout 12/05/2018  . Osteoarthritis 12/05/2018  . Seasonal allergies 12/05/2018  . 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 Morbid obesity ([place]) 12/05/2018  . Persistent atrial fibrillation ([place])   . OSA on CPAP   . Dyslipidemia     Past Surgical History:  Procedure Laterality Date  . arthroscopic knee surgery Bilateral 2003  . CARDIOVERSION N/A 08/10/2016   Procedure: CARDIOVERSION;  Surgeon: Pixie Casino, MD;  Location[facility] ENDOSCOPY;  Service: Cardiovascular;  Laterality: N/A;  . REPLACEMENT TOTAL KNEE Bilateral 2008  . TONSILLECTOMY    . VASECTOMY      Family History  Problem Relation Age of Onset  . Alzheimer's disease Mother   . Heart attack Mother   . Hyperlipidemia Mother   . Hypertension Mother   . Arthritis Mother   . Heart attack Father 50  . Arthritis Father   . Hyperlipidemia Father   . Hypertension Father   . Hypertension Brother   . Hyperlipidemia Brother   . Other Brother        [place]  . Atrial fibrillation Neg Hx     Medications- reviewed and updated Current Outpatient Medications  Medication Sig Dispense Refill  . acetaminophen (TYLENOL) 500 MG tablet Take 500 mg by mouth every 6 (six) hours as needed.    Marland Kitchen amLODIPine-Valsartan-HCTZ 5-160-12.5 MG TABS Take 1 tablet by mouth daily. 90 tablet 3  . amoxicillin (AMOXIL) 500 MG capsule Take prior to dental apt.    . diphenhydramine-acetaminophen (TYLENOL PM) 25-500 MG TABS tablet Take 1 tablet by mouth at bedtime as needed.    . fexofenadine (ALLEGRA) 180 MG tablet Take 180 mg by mouth daily as needed for allergies or rhinitis.    . Ginkgo Biloba (GINKOBA) 40 MG TABS Take 120 mg by mouth daily.     . indomethacin (INDOCIN) 50 MG capsule     . latanoprost (XALATAN) 0.005 % ophthalmic solution Place 1 drop into both eyes daily.    . methocarbamol (ROBAXIN) 750 MG tablet Take 750 mg by mouth as needed.    . metoprolol succinate (TOPROL-XL) 50 MG 24 hr tablet Take 1 tablet daily with or immediately following a meal. ** DO NOT CRUSH **    (BETA BLOCKER). 90 tablet 3  . mometasone (NASONEX) 50 MCG/ACT nasal spray Place 2 sprays into the nose daily.    . Multiple Vitamin (MULTI-DAY PO) Take 1 tablet by mouth daily.     . Multiple  Vitamins-Minerals (AIRBORNE PO) Take 1 tablet by mouth daily.     . OMEGA-3 KRILL OIL PO Take 1 tablet by mouth daily.     Marland Kitchen PENNSAID 2 % SOLN Apply 1-2 Pump topically as needed.  1  . rivaroxaban (XARELTO) 20 MG TABS tablet TAKE 1 TABLET DAILY WITH   SUPPER 90 tablet 3  . rosuvastatin (CRESTOR) 20 MG tablet Take 1 tablet (20 mg total) by mouth daily. 90 tablet 3  . sertraline (ZOLOFT) 50 MG tablet TAKE 1 TABLET DAILY 90 tablet 0  . tadalafil (CIALIS) 10 MG tablet Take 1 tablet (10 mg total) by mouth daily as needed for erectile dysfunction. 30 tablet 0  . thiamine (VITAMIN B-1) 100 MG tablet Take 100 mg by mouth daily.    Marland Kitchen azelastine (ASTELIN) 0.1 % nasal spray Place 2 sprays into both nostrils 2 (two) times daily. 30 mL 12   No current facility-administered medications for this visit.  Allergies-reviewed and updated No Known Allergies  Social History   Socioeconomic History  . Marital status: Married    Spouse name: Not on file  . Number of children: Not on file  . Years of education: Not on file  . Highest education level: Not on file  Occupational History  . Not on file  Tobacco Use  . Smoking status: Former Smoker    Types: Cigarettes    Quit date: 07/04/2001    Years since quitting: 17.9  . Smokeless tobacco: Never Used  Substance and Sexual Activity  . Alcohol use: Yes    Comment: OCCASIONALLY  . Drug use: No  . Sexual activity: Yes    Comment: MARRIED  Other Topics Concern  . Not on file  Social History Narrative   Lives in [place], [place]   Married   2 kids   Retired - Prior worked at [employer] of [employer]:   . Difficulty of Paying Living Expenses: Not on [employer] Insecurity:   . Worried About Charity fundraiser in the Last Year: Not on file  . Ran Out of Food in the Last Year: Not on file  Transportation Needs:   . Lack of Transportation (Medical): Not on file  . Lack of Transportation (Non-Medical): Not on  file  Physical Activity:   . Days of Exercise per Week: Not on file  . Minutes of Exercise per Session: Not on file  Stress:   . Feeling of Stress : Not on file  Social Connections:   . Frequency of Communication with Friends and Family: Not on file  . Frequency of Social Gatherings with Friends and Family: Not on file  . Attends Religious Services: Not on file  . Active Member of Clubs or Organizations: Not on file  . Attends Archivist Meetings: Not on file  . Marital Status: Not on file        Objective:  Physical Exam: BP 128/80   Pulse 60   Temp (!) 97 F (36.1 C)   Ht 6' 2"  (1.88 m)   Wt (!) 348 lb 8 oz (158.1 kg)   SpO2 99%   BMI 44.74 kg/m   Body mass index is 44.74 kg/m. Wt Readings from Last 3 Encounters:  06/11/19 (!) 348 lb 8 oz (158.1 kg)  04/14/19 (!) 369 lb 9.6 oz (167.6 kg)  03/05/19 (!) 346 lb (156.9 kg)   Gen: NAD, resting comfortably HEENT: TMs normal bilaterally. OP clear. No thyromegaly noted.  CV: RRR with no murmurs appreciated Pulm: NWOB, CTAB with no crackles, wheezes, or rhonchi GI: Normal bowel sounds present. Soft, Nontender, Nondistended. MSK: no edema, cyanosis, or clubbing noted Skin: warm, dry Neuro: CN2-12 grossly intact. Strength 5/5 in upper and lower extremities. Reflexes symmetric and intact bilaterally.  Psych: Normal affect and thought content     Tresa Jolley M. Jerline Pain, MD 06/11/2019 9:47 AM

## 2019-06-11 NOTE — Assessment & Plan Note (Signed)
Continue management per bariatrics.  We will check requested labs today including C met, H. pylori, A1c, iron level, lipid panel, TSH, B1, B12, and vitamin D.

## 2019-06-11 NOTE — Assessment & Plan Note (Signed)
Send in Astelin to use as needed for flares.  Continue Allegra and Nasonex.

## 2019-06-11 NOTE — Assessment & Plan Note (Signed)
Stable.  Check uric acid level today.

## 2019-06-11 NOTE — Assessment & Plan Note (Signed)
Stable.  Continue Zoloft 50 mg daily. 

## 2019-06-11 NOTE — Assessment & Plan Note (Signed)
Stable.  Continue amlodipine-valsartan-HCTZ 5-160-12.5 and metoprolol succinate 50 mg daily.  Hopefully will able to come down on blood pressure medication status post bariatric surgery.

## 2019-06-11 NOTE — Patient Instructions (Signed)
It was very nice to see you today!  I think you will do very well with your surgery!  We will check blood work today.  Come back in 6 months, or sooner if needed.  Take care, Dr Jerline Pain  Please try these tips to maintain a healthy lifestyle:   Eat at least 3 REAL meals and 1-2 snacks per day.  Aim for no more than 5 hours between eating.  If you eat breakfast, please do so within one hour of getting up.    Each meal should contain half fruits/vegetables, one quarter protein, and one quarter carbs (no bigger than a computer mouse)   Cut down on sweet beverages. This includes juice, soda, and sweet tea.     Drink at least 1 glass of water with each meal and aim for at least 8 glasses per day   Exercise at least 150 minutes every week.    Preventive Care 70 Years and Older, Male Preventive care refers to lifestyle choices and visits with your health care provider that can promote health and wellness. This includes:  A yearly physical exam. This is also called an annual well check.  Regular dental and eye exams.  Immunizations.  Screening for certain conditions.  Healthy lifestyle choices, such as diet and exercise. What can I expect for my preventive care visit? Physical exam Your health care provider will check:  Height and weight. These may be used to calculate body mass index (BMI), which is a measurement that tells if you are at a healthy weight.  Heart rate and blood pressure.  Your skin for abnormal spots. Counseling Your health care provider may ask you questions about:  Alcohol, tobacco, and drug use.  Emotional well-being.  Home and relationship well-being.  Sexual activity.  Eating habits.  History of falls.  Memory and ability to understand (cognition).  Work and work Statistician. What immunizations do I need?  Influenza (flu) vaccine  This is recommended every year. Tetanus, diphtheria, and pertussis (Tdap) vaccine  You may need a Td  booster every 10 years. Varicella (chickenpox) vaccine  You may need this vaccine if you have not already been vaccinated. Zoster (shingles) vaccine  You may need this after age 80. Pneumococcal conjugate (PCV13) vaccine  One dose is recommended after age 6. Pneumococcal polysaccharide (PPSV23) vaccine  One dose is recommended after age 70. Measles, mumps, and rubella (MMR) vaccine  You may need at least one dose of MMR if you were born in 1957 or later. You may also need a second dose. Meningococcal conjugate (MenACWY) vaccine  You may need this if you have certain conditions. Hepatitis A vaccine  You may need this if you have certain conditions or if you travel or work in places where you may be exposed to hepatitis A. Hepatitis B vaccine  You may need this if you have certain conditions or if you travel or work in places where you may be exposed to hepatitis B. Haemophilus influenzae type b (Hib) vaccine  You may need this if you have certain conditions. You may receive vaccines as individual doses or as more than one vaccine together in one shot (combination vaccines). Talk with your health care provider about the risks and benefits of combination vaccines. What tests do I need? Blood tests  Lipid and cholesterol levels. These may be checked every 5 years, or more frequently depending on your overall health.  Hepatitis C test.  Hepatitis B test. Screening  Lung cancer screening. You  may have this screening every year starting at age 68 if you have a 30-pack-year history of smoking and currently smoke or have quit within the past 15 years.  Colorectal cancer screening. All adults should have this screening starting at age 31 and continuing until age 78. Your health care provider may recommend screening at age 73 if you are at increased risk. You will have tests every 1-10 years, depending on your results and the type of screening test.  Prostate cancer screening.  Recommendations will vary depending on your family history and other risks.  Diabetes screening. This is done by checking your blood sugar (glucose) after you have not eaten for a while (fasting). You may have this done every 1-3 years.  Abdominal aortic aneurysm (AAA) screening. You may need this if you are a current or former smoker.  Sexually transmitted disease (STD) testing. Follow these instructions at home: Eating and drinking  Eat a diet that includes fresh fruits and vegetables, whole grains, lean protein, and low-fat dairy products. Limit your intake of foods with high amounts of sugar, saturated fats, and salt.  Take vitamin and mineral supplements as recommended by your health care provider.  Do not drink alcohol if your health care provider tells you not to drink.  If you drink alcohol: ? Limit how much you have to 0-2 drinks a day. ? Be aware of how much alcohol is in your drink. In the [place] one drink equals one 12 oz bottle of beer (355 mL), one 5 oz glass of wine (148 mL), or one 1 oz glass of hard liquor (44 mL). Lifestyle  Take daily care of your teeth and gums.  Stay active. Exercise for at least 30 minutes on 5 or more days each week.  Do not use any products that contain nicotine or tobacco, such as cigarettes, e-cigarettes, and chewing tobacco. If you need help quitting, ask your health care provider.  If you are sexually active, practice safe sex. Use a condom or other form of protection to prevent STIs (sexually transmitted infections).  Talk with your health care provider about taking a low-dose aspirin or statin. What's next?  Visit your health care provider once a year for a well check visit.  Ask your health care provider how often you should have your eyes and teeth checked.  Stay up to date on all vaccines. This information is not intended to replace advice given to you by your health care provider. Make sure you discuss any questions you have with  your health care provider. Document Revised: 04/11/2018 Document Reviewed: 04/11/2018 Elsevier Patient Education  2020 [employer].

## 2019-06-11 NOTE — Assessment & Plan Note (Signed)
Check lipid panel.  Continue Crestor 20 mg daily. 

## 2019-06-12 ENCOUNTER — Other Ambulatory Visit: Payer: Self-pay

## 2019-06-12 ENCOUNTER — Telehealth: Payer: Self-pay | Admitting: Family Medicine

## 2019-06-12 MED ORDER — AMLODIPINE-VALSARTAN-HCTZ 5-160-12.5 MG PO TABS: 1.0000 | ORAL_TABLET | Freq: Every day | ORAL | 3 refills | Status: DC

## 2019-06-12 NOTE — Telephone Encounter (Signed)
[facility] called in wanting to know if the patient could take a combination of medicine that make up amLODIPine-Valsartan-HCTZ 5-160-12.5 MG TABS, the generic of this is on back order.

## 2019-06-12 NOTE — Telephone Encounter (Signed)
Rx sent 

## 2019-06-15 LAB — HEPATITIS C ANTIBODY
Hepatitis C Ab: NONREACTIVE
SIGNAL TO CUT-OFF: 0.01 (ref <1.00)

## 2019-06-15 LAB — VITAMIN B1: Vitamin B1 (Thiamine): 56 nmol/L — ABNORMAL HIGH (ref 8–30)

## 2019-06-15 LAB — H. PYLORI BREATH TEST

## 2019-06-16 ENCOUNTER — Encounter: Payer: Self-pay | Admitting: Family Medicine

## 2019-06-16 DIAGNOSIS — E559 Vitamin D deficiency, unspecified: Secondary | ICD-10-CM | POA: Insufficient documentation

## 2019-06-16 NOTE — Progress Notes (Signed)
Please inform patient of the following:  Vitamin D is low - recommend starting supplementation with 1000-2000 IU daily Uric acid level is high but if he is not having many gout flares we do not need to start any additional medications at this point. Blood sugar is borderline.  His H pylori test was not completed - needs to come back to get this done.  All of his other labs are NORMAL.

## 2019-06-17 ENCOUNTER — Other Ambulatory Visit: Payer: Self-pay

## 2019-06-17 ENCOUNTER — Ambulatory Visit (INDEPENDENT_AMBULATORY_CARE_PROVIDER_SITE_OTHER): Payer: Medicare Other | Admitting: Psychology

## 2019-06-17 DIAGNOSIS — Z1159 Encounter for screening for other viral diseases: Secondary | ICD-10-CM

## 2019-06-17 NOTE — Addendum Note (Signed)
Addended by: Loralyn Freshwater on: 06/17/2019 04:06 PM   Modules accepted: Orders

## 2019-06-17 NOTE — Progress Notes (Signed)
.  h 

## 2019-06-20 ENCOUNTER — Other Ambulatory Visit: Payer: Self-pay

## 2019-06-20 ENCOUNTER — Other Ambulatory Visit: Payer: Medicare Other

## 2019-06-20 DIAGNOSIS — Z1159 Encounter for screening for other viral diseases: Secondary | ICD-10-CM

## 2019-06-23 ENCOUNTER — Other Ambulatory Visit: Payer: Self-pay

## 2019-06-23 ENCOUNTER — Telehealth: Payer: Self-pay | Admitting: Family Medicine

## 2019-06-23 DIAGNOSIS — A048 Other specified bacterial intestinal infections: Secondary | ICD-10-CM

## 2019-06-23 LAB — H. PYLORI BREATH TEST: H. pylori Breath Test: DETECTED — AB

## 2019-06-23 MED ORDER — AMLODIPINE-VALSARTAN-HCTZ 5-160-12.5 MG PO TABS: 1.0000 | ORAL_TABLET | Freq: Every day | ORAL | 0 refills | Status: DC

## 2019-06-23 MED ORDER — PANTOPRAZOLE SODIUM 40 MG PO TBEC: 40.0000 mg | DELAYED_RELEASE_TABLET | Freq: Two times a day (BID) | ORAL | 0 refills | Status: DC

## 2019-06-23 MED ORDER — AMOXICILLIN 500 MG PO CAPS: 1000.0000 mg | ORAL_CAPSULE | Freq: Two times a day (BID) | ORAL | 0 refills | Status: AC

## 2019-06-23 MED ORDER — CLARITHROMYCIN 500 MG PO TABS: 500.0000 mg | ORAL_TABLET | Freq: Two times a day (BID) | ORAL | 0 refills | Status: AC

## 2019-06-23 NOTE — Telephone Encounter (Signed)
MEDICATION: Amlodipine 12.5 MG Tablets  PHARMACY: [facility]  Comments: Pt just needs two weeks until other prescription gets delivered.   **Let patient know to contact pharmacy at the end of the day to make sure medication is ready. **  ** Please notify patient to allow 48-72 hours to process**  **Encourage patient to contact the pharmacy for refills or they can request refills through [facility]**

## 2019-06-23 NOTE — Telephone Encounter (Signed)
Rx sent 

## 2019-06-23 NOTE — Progress Notes (Signed)
Please inform patient of the following:  His h pylori test is positive. We will need to treat. Please send in protonix 40mg  big x 14 days, clarithromycin 500mg  bid x 14 days, and amoxicillin 1000mg  bid x 14 days. He needs to come back 4 weeks after finishing his antibiotics to have a confirmation of eradication - please place future order for h pylori breath test.   Algis Greenhouse. Jerline Pain, MD 06/23/2019 11:42 AM

## 2019-06-25 ENCOUNTER — Telehealth: Payer: Self-pay | Admitting: Family Medicine

## 2019-06-25 NOTE — Telephone Encounter (Signed)
  LAST APPOINTMENT DATE: 06/23/2019   NEXT APPOINTMENT DATE:@8 /03/2020  MEDICATION:amLODIPine-Valsartan-HCTZ 5-160-12.5 MG TABS  [facility] [place] [facility]  COMMENT: Prescription is on back order and would like to know what Dr.Parker like to do.   **Let patient know to contact pharmacy at the end of the day to make sure medication is ready. **  ** Please notify patient to allow 48-72 hours to process**  **Encourage patient to contact the pharmacy for refills or they can request refills through [facility]**  CLINICAL FILLS OUT ALL BELOW:   LAST REFILL:  QTY:  REFILL DATE:    OTHER COMMENTS:    Okay for refill?  Please advise

## 2019-06-26 ENCOUNTER — Telehealth: Payer: Self-pay | Admitting: Family Medicine

## 2019-06-26 MED ORDER — AMLODIPINE-VALSARTAN-HCTZ 5-160-12.5 MG PO TABS: 1.0000 | ORAL_TABLET | Freq: Every day | ORAL | 1 refills | Status: DC

## 2019-06-26 NOTE — Telephone Encounter (Signed)
[facility] calling to advise that pt only has 3 days left of Amlodipine-Valsartan-HCTZ and they are currently out of stock by mail order and at local pharmacy. They will be out of stock until August. They are requesting alternative be sent in ASAP as pt is almost out. Pharmacist did not provide any recommendations for alternative. Currently pt pays $20 for the combined pill. Per pharmacy tech, they do have individual meds available but this would more than double the cost of the medication for patient.   Forwarding to Dr. Jerline Pain to advise.

## 2019-06-26 NOTE — Telephone Encounter (Signed)
Rx refilled.

## 2019-06-26 NOTE — Telephone Encounter (Signed)
Spoke with patient,I will be sending Rx separately

## 2019-06-26 NOTE — Telephone Encounter (Signed)
Would not recommend switching to different medication. Recommend either patient try another pharmacy or split the medication into its three components.  Isaiah Jones. Jerline Pain, MD 06/26/2019 3:57 PM

## 2019-06-26 NOTE — Telephone Encounter (Signed)
Last OV 06/11/19 Last refill 06/23/19 #30/0 Next OV 12/10/19

## 2019-06-27 ENCOUNTER — Other Ambulatory Visit: Payer: Self-pay

## 2019-06-27 MED ORDER — VALSARTAN 160 MG PO TABS: 160.0000 mg | ORAL_TABLET | Freq: Every day | ORAL | 1 refills | Status: DC

## 2019-06-27 MED ORDER — AMLODIPINE BESYLATE 5 MG PO TABS: 5.0000 mg | ORAL_TABLET | Freq: Every day | ORAL | 1 refills | Status: DC

## 2019-06-27 MED ORDER — HYDROCHLOROTHIAZIDE 12.5 MG PO TABS: 12.5000 mg | ORAL_TABLET | Freq: Every day | ORAL | 1 refills | Status: DC

## 2019-06-30 DIAGNOSIS — Z8616 Personal history of COVID-19: Secondary | ICD-10-CM

## 2019-06-30 HISTORY — DX: Personal history of COVID-19: Z86.16

## 2019-07-04 NOTE — Patient Instructions (Addendum)
DUE TO COVID-19 ONLY ONE VISITOR IS ALLOWED TO COME WITH YOU AND STAY IN THE WAITING ROOM ONLY DURING PRE OP AND PROCEDURE DAY OF SURGERY. THE 1 VISITOR MAY VISIT WITH YOU AFTER SURGERY IN YOUR PRIVATE ROOM DURING VISITING HOURS ONLY!  YOU NEED TO HAVE A COVID 19 TEST ON__3/12/21_____ @_11 :00______, THIS TEST MUST BE DONE BEFORE SURGERY, COME  [address], [place] , [address].  ([place]) ONCE YOUR COVID TEST IS COMPLETED, PLEASE BEGIN THE QUARANTINE INSTRUCTIONS AS OUTLINED IN YOUR HANDOUT.                Isaiah Jones    Your procedure is scheduled on: 07/15/19   Report to [facility] Main  Entrance   Report to admitting at  7:15 AM     Call this number if you have problems the morning of surgery [phone]    Remember: Do not eat food after 6:00 PM    BRUSH YOUR [place], NO CHEWING GUM CANDY OR MINTS.     Take these medicines the morning of surgery with A SIP OF WATER: Amlodipine, Protonix,  Bring mask and tubing to the hospital.   MORNING OF SURGERY DRINK:   DRINK 1 G2 drink BEFORE YOU LEAVE HOME, DRINK ALL OF THE  G2 DRINK AT ONE TIME.    NO SOLID FOOD AFTER 600 PM THE NIGHT BEFORE YOUR SURGERY. YOU MAY DRINK CLEAR FLUIDS.   THE G2 DRINK YOU DRINK BEFORE YOU LEAVE HOME WILL BE THE LAST FLUIDS YOU DRINK BEFORE SURGERY.    PAIN IS EXPECTED AFTER SURGERY AND WILL NOT BE COMPLETELY ELIMINATED.   AMBULATION AND TYLENOL WILL HELP REDUCE INCISIONAL AND GAS PAIN. MOVEMENT IS KEY!  YOU ARE EXPECTED TO BE OUT OF BED WITHIN 4 HOURS OF ADMISSION TO YOUR PATIENT ROOM.  SITTING IN THE RECLINER THROUGHOUT THE DAY IS IMPORTANT FOR DRINKING FLUIDS AND MOVING GAS THROUGHOUT THE GI TRACT.  COMPRESSION STOCKINGS SHOULD BE WORN [place] UNLESS YOU ARE WALKING.   INCENTIVE SPIROMETER SHOULD BE USED EVERY HOUR WHILE AWAKE TO DECREASE POST-OPERATIVE COMPLICATIONS SUCH AS PNEUMONIA.  WHEN  DISCHARGED HOME, IT IS IMPORTANT TO CONTINUE TO WALK EVERY HOUR AND USE THE INCENTIVE SPIROMETER EVERY HOUR.     You may not have any metal on your body including              piercings  Do not wear jewelry,  lotions, powders ,deodorant .              Men may shave face and neck.   Do not bring valuables to the hospital. [place].  Contacts, dentures or bridgework may not be worn into surgery.      Name and phone number of your driver:  Special Instructions: N/A              Please read over the following fact sheets you were given: _____________________________________________________________________             [facility] - Preparing for Surgery Before surgery, you can play an important role.   Because skin is not sterile, your skin needs to be as free of germs as possible.   You can reduce the number of germs on your skin by washing with CHG (chlorahexidine gluconate) soap before surgery.  CHG is an antiseptic cleaner which kills germs and bonds with the skin to continue killing germs even after washing. Please DO NOT use if you have an allergy to CHG or antibacterial soaps.   If your skin becomes reddened/irritated stop using the CHG and inform your nurse when you arrive at Short Stay. .  You may shave your face/neck.  Please follow these instructions carefully:  1.  Shower with CHG Soap the night before surgery and the  morning of Surgery.  2.  If you choose to wash your hair, wash your hair first as usual with your  normal  shampoo.  3.  After you shampoo, rinse your hair and body thoroughly to remove the  shampoo.                                        4.  Use CHG as you would any other liquid soap.  You can apply chg directly  to the skin and wash                       Gently with a scrungie or clean washcloth.  5.  Apply the CHG Soap to your body ONLY FROM THE NECK DOWN.   Do not use on face/ open                            Wound or open sores. Avoid contact with eyes, ears mouth and genitals (private parts).                       Wash face,  Genitals (private parts) with your normal soap.             6.  Wash thoroughly, paying special attention to the area where your surgery  will be performed.  7.  Thoroughly rinse your body with warm water from the neck down.  8.  DO NOT shower/wash with your normal soap after using and rinsing off  the CHG Soap.             9.  Pat yourself dry with a clean towel.            10.  Wear clean pajamas.            11.  Place clean sheets on your bed the night of your first shower and do not  sleep with pets. Day of Surgery : Do not apply any lotions/deodorants the morning of surgery.  Please wear clean clothes to the hospital/surgery center.  FAILURE TO FOLLOW THESE INSTRUCTIONS MAY RESULT IN THE CANCELLATION OF YOUR SURGERY PATIENT SIGNATURE_________________________________  NURSE SIGNATURE__________________________________  ________________________________________________________________________   Isaiah Jones  An incentive spirometer is a tool that can help keep your lungs clear and active. This tool measures how well you are filling your lungs with each breath. Taking long deep breaths may help reverse or decrease the chance of developing breathing (pulmonary) problems (especially infection) following:  A long period of time when you are unable to move or be active. BEFORE THE PROCEDURE   If the spirometer includes an indicator to show your best effort, your nurse or respiratory therapist will set it to a desired goal.  If possible, sit up straight or lean slightly forward. Try not to slouch.  Hold the incentive spirometer in  an upright position. INSTRUCTIONS FOR USE  1. Sit on the edge of your bed if possible, or sit up as far as you can in bed or on a chair. 2. Hold the incentive spirometer in an upright position. 3. Breathe out normally. 4. Place  the mouthpiece in your mouth and seal your lips tightly around it. 5. Breathe in slowly and as deeply as possible, raising the piston or the ball toward the top of the column. 6. Hold your breath for 3-5 seconds or for as long as possible. Allow the piston or ball to fall to the bottom of the column. 7. Remove the mouthpiece from your mouth and breathe out normally. 8. Rest for a few seconds and repeat Steps 1 through 7 at least 10 times every 1-2 hours when you are awake. Take your time and take a few normal breaths between deep breaths. 9. The spirometer may include an indicator to show your best effort. Use the indicator as a goal to work toward during each repetition. 10. After each set of 10 deep breaths, practice coughing to be sure your lungs are clear. If you have an incision (the cut made at the time of surgery), support your incision when coughing by placing a pillow or rolled up towels firmly against it. Once you are able to get out of bed, walk around indoors and cough well. You may stop using the incentive spirometer when instructed by your caregiver.  RISKS AND COMPLICATIONS  Take your time so you do not get dizzy or light-headed.  If you are in pain, you may need to take or ask for pain medication before doing incentive spirometry. It is harder to take a deep breath if you are having pain. AFTER USE  Rest and breathe slowly and easily.  It can be helpful to keep track of a log of your progress. Your caregiver can provide you with a simple table to help with this. If you are using the spirometer at home, follow these instructions: [place] IF:   You are having difficultly using the spirometer.  You have trouble using the spirometer as often as instructed.  Your pain medication is not giving enough relief while using the spirometer.  You develop fever of 100.5 F (38.1 C) or higher. SEEK IMMEDIATE MEDICAL CARE IF:   You cough up bloody sputum that had not been  present before.  You develop fever of 102 F (38.9 C) or greater.  You develop worsening pain at or near the incision site. MAKE SURE YOU:   Understand these instructions.  Will watch your condition.  Will get help right away if you are not doing well or get worse. Document Released: 08/28/2006 Document Revised: 07/10/2011 Document Reviewed: 10/29/2006 [facility] Patient Information 2014 [place], [place].   ________________________________________________________________________

## 2019-07-07 ENCOUNTER — Encounter (HOSPITAL_COMMUNITY)
Admission: RE | Admit: 2019-07-07 | Discharge: 2019-07-07 | Disposition: A | Discharge status: Home / Self Care | Payer: Medicare Other | Source: Ambulatory Visit | Attending: Surgery | Admitting: Surgery

## 2019-07-07 ENCOUNTER — Encounter (HOSPITAL_COMMUNITY): Payer: Self-pay

## 2019-07-07 ENCOUNTER — Other Ambulatory Visit: Payer: Self-pay

## 2019-07-07 DIAGNOSIS — E785 Hyperlipidemia, unspecified: Secondary | ICD-10-CM | POA: Diagnosis not present

## 2019-07-07 DIAGNOSIS — Z86010 Personal history of colonic polyps: Secondary | ICD-10-CM | POA: Insufficient documentation

## 2019-07-07 DIAGNOSIS — I509 Heart failure, unspecified: Secondary | ICD-10-CM | POA: Diagnosis not present

## 2019-07-07 DIAGNOSIS — G4733 Obstructive sleep apnea (adult) (pediatric): Secondary | ICD-10-CM | POA: Insufficient documentation

## 2019-07-07 DIAGNOSIS — M1990 Unspecified osteoarthritis, unspecified site: Secondary | ICD-10-CM | POA: Insufficient documentation

## 2019-07-07 DIAGNOSIS — Z01818 Encounter for other preprocedural examination: Secondary | ICD-10-CM | POA: Insufficient documentation

## 2019-07-07 DIAGNOSIS — Z79899 Other long term (current) drug therapy: Secondary | ICD-10-CM | POA: Insufficient documentation

## 2019-07-07 DIAGNOSIS — Z96653 Presence of artificial knee joint, bilateral: Secondary | ICD-10-CM | POA: Insufficient documentation

## 2019-07-07 DIAGNOSIS — Z6841 Body Mass Index (BMI) 40.0 and over, adult: Secondary | ICD-10-CM | POA: Diagnosis not present

## 2019-07-07 DIAGNOSIS — H409 Unspecified glaucoma: Secondary | ICD-10-CM | POA: Diagnosis not present

## 2019-07-07 DIAGNOSIS — I480 Paroxysmal atrial fibrillation: Secondary | ICD-10-CM | POA: Insufficient documentation

## 2019-07-07 DIAGNOSIS — I110 Hypertensive heart disease with heart failure: Secondary | ICD-10-CM | POA: Diagnosis not present

## 2019-07-07 DIAGNOSIS — Z7901 Long term (current) use of anticoagulants: Secondary | ICD-10-CM | POA: Diagnosis not present

## 2019-07-07 DIAGNOSIS — Z87891 Personal history of nicotine dependence: Secondary | ICD-10-CM | POA: Insufficient documentation

## 2019-07-07 HISTORY — DX: Other complications of anesthesia, initial encounter: T88.59XA

## 2019-07-07 HISTORY — DX: Nausea with vomiting, unspecified: R11.2

## 2019-07-07 HISTORY — DX: Family history of other specified conditions: Z84.89

## 2019-07-07 HISTORY — DX: Other specified postprocedural states: Z98.890

## 2019-07-07 LAB — COMPREHENSIVE METABOLIC PANEL
ALT: 15 U/L (ref 0–44)
AST: 28 U/L (ref 15–41)
Albumin: 4.2 g/dL (ref 3.5–5.0)
Alkaline Phosphatase: 62 U/L (ref 38–126)
Anion gap: 8 (ref 5–15)
BUN: 17 mg/dL (ref 8–23)
CO2: 27 mmol/L (ref 22–32)
Calcium: 9.7 mg/dL (ref 8.9–10.3)
Chloride: 106 mmol/L (ref 98–111)
Creatinine, Ser: 0.87 mg/dL (ref 0.61–1.24)
GFR calc Af Amer: >60 mL/min (ref >60)
GFR calc non Af Amer: >60 mL/min (ref >60)
Glucose, Bld: 92 mg/dL (ref 70–99)
Potassium: 4.7 mmol/L (ref 3.5–5.1)
Sodium: 141 mmol/L (ref 135–145)
Total Bilirubin: 1 mg/dL (ref 0.3–1.2)
Total Protein: 7.2 g/dL (ref 6.5–8.1)

## 2019-07-07 LAB — CBC WITH DIFFERENTIAL/PLATELET
Abs Immature Granulocytes: 0.04 10*3/uL (ref 0.00–0.07)
Basophils Absolute: 0 10*3/uL (ref 0.0–0.1)
Basophils Relative: 1 %
Eosinophils Absolute: 0.1 10*3/uL (ref 0.0–0.5)
Eosinophils Relative: 1 %
HCT: 49.4 % (ref 39.0–52.0)
Hemoglobin: 16.2 g/dL (ref 13.0–17.0)
Immature Granulocytes: 1 %
Lymphocytes Relative: 35 %
Lymphs Abs: 2.4 10*3/uL (ref 0.7–4.0)
MCH: 31.3 pg (ref 26.0–34.0)
MCHC: 32.8 g/dL (ref 30.0–36.0)
MCV: 95.6 fL (ref 80.0–100.0)
Monocytes Absolute: 0.6 10*3/uL (ref 0.1–1.0)
Monocytes Relative: 9 %
Neutro Abs: 3.6 10*3/uL (ref 1.7–7.7)
Neutrophils Relative %: 53 %
Platelets: 213 10*3/uL (ref 150–400)
RBC: 5.17 MIL/uL (ref 4.22–5.81)
RDW: 13.1 % (ref 11.5–15.5)
WBC: 6.8 10*3/uL (ref 4.0–10.5)
nRBC: 0 % (ref 0.0–0.2)

## 2019-07-07 LAB — ABO/RH: ABO/RH(D): O NEG

## 2019-07-07 NOTE — Progress Notes (Signed)
PCP - Dr. Zack Seal Cardiologist - Dr. Lendell Caprice  Chest x-ray -  10/03/18 EKG - 10/03/18 Stress Test -  ECHO - 2018 Cardiac Cath - no  Sleep Study - yes CPAP - yes  Fasting Blood Sugar - NA Checks Blood Sugar _____ times a day  Blood Thinner Instructions:Xarelto Aspirin Instructions:Dr. Hassell Done said to stop it 5 days prior to DOS. Last Dose:3/9  Anesthesia review:   Patient denies shortness of breath, fever, cough and chest pain at PAT appointment yes  Patient verbalized understanding of instructions that were given to them at the PAT appointment. Patient was also instructed that they will need to review over the PAT instructions again at home before surgery. yes

## 2019-07-08 ENCOUNTER — Telehealth: Payer: Self-pay | Admitting: Skilled Nursing Facility1

## 2019-07-08 NOTE — Progress Notes (Signed)
Anesthesia Chart Review   Case: [id number] Date/Time: 07/15/19 1053   Procedure: LAPAROSCOPIC GASTRIC SLEEVE RESECTION, Upper Endo, ERAS Pathway (N/A )   Anesthesia type: General   Pre-op diagnosis: Morbid Obesity, OSA, HTN, H/O A-Fib   Location: [facility] ROOM 01 / [facility]   Surgeons: [name], MD      DISCUSSION:68 y.o. former smoker with h/o PONV, PAF (on Xarelto), HTN, OSA w/CPAP, HLD, morbid obesity scheduled for above procedure 07/15/19 with Dr. [name].   Pt last seen by cardiologist, Dr. Irish Lack, 02/18/2019.  Per OV note, "No signs of CHF.  No angina with walking up stairs.  No further cardiac testing needed at this time-surgery planned for 03/2019 per his report."  1 year follow up recommended. Stable since this visit.  Advised to hold Xarelto 5 days prior to procedure.    Anticipate pt can proceed with planned procedure barring acute status change.   VS: Pulse 68   Temp 36.6 C (Oral)   Resp 18   Ht 6\' 2"  (1.88 m)   Wt (!) 156.9 kg   SpO2 97%   BMI 44.42 kg/m   PROVIDERS: Vivi Barrack, MD is PCP   Larae Grooms, MD is Cardiologist  LABS: Labs reviewed: Acceptable for surgery. (all labs ordered are listed, but only abnormal results are displayed)  Labs Reviewed  ABO/RH     IMAGES:   EKG: 10/03/2018 Rate 75 bpm  Atrial fibrillation with premature ventricular or aberrantly conducted complexes Low voltage QRS Cannot rule out Anterior infarct , age undetermined Abnormal ECG Compared to previous tracing , ventricular ectopy now resolved.  CV: Echo 07/11/2016 Study Conclusions   - Left ventricle: The cavity size was normal. Wall thickness was  increased in a pattern of mild LVH. Systolic function was normal.  The estimated ejection fraction was in the range of 55% to 60%.  Wall motion was normal; there were no regional wall motion  abnormalities. Doppler parameters are consistent with abnormal  left ventricular relaxation (grade 1  diastolic dysfunction).  - Left atrium: The atrium was mildly dilated. Volume/bsa, S: 37.4  ml/m^2.  Past Medical History:  Diagnosis Date  . Arthritis, degenerative    hip,shoulders, hands, back  . 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 CHF (congestive heart failure) ([place])   . Colon polyps   . Colon polyps   . Complication of anesthesia   . Dyslipidemia   . Dysrhythmia 2017   A fib  . Family history of adverse reaction to anesthesia   . Glaucoma   . Hyperlipidemia   . Hypertension   . OSA on CPAP    C-Pap  . Persistent atrial fibrillation ([place])    Echo 3/18: Mild LVH, EF 55-60, normal wall motion, grade 1 diastolic dysfunction, mild LAE  . PONV (postoperative nausea and vomiting)   . Rhinitis, allergic     Past Surgical History:  Procedure Laterality Date  . arthroscopic knee surgery Bilateral 2003  . CARDIOVERSION N/A 08/10/2016   Procedure: CARDIOVERSION;  Surgeon: Pixie Casino, MD;  Location: [facility] ENDOSCOPY;  Service: Cardiovascular;  Laterality: N/A;  . REPLACEMENT TOTAL KNEE Bilateral 2008  . TONSILLECTOMY    . VASECTOMY      MEDICATIONS: . acetaminophen (TYLENOL) 500 MG tablet  . amLODipine (NORVASC) 5 MG tablet  . amLODIPine-Valsartan-HCTZ 5-160-12.5 MG TABS  . amoxicillin (AMOXIL) 500 MG capsule  . azelastine (ASTELIN) 0.1 % nasal spray  . diphenhydramine-acetaminophen (TYLENOL PM) 25-500 MG TABS  tablet  . fexofenadine (ALLEGRA) 180 MG tablet  . Ginkgo Biloba 60 MG TABS  . hydrochlorothiazide (HYDRODIURIL) 12.5 MG tablet  . indomethacin (INDOCIN) 50 MG capsule  . latanoprost (XALATAN) 0.005 % ophthalmic solution  . methocarbamol (ROBAXIN) 750 MG tablet  . metoprolol succinate (TOPROL-XL) 50 MG 24 hr tablet  . mometasone (NASONEX) 50 MCG/ACT nasal spray  . Multiple Vitamin (MULTIVITAMIN WITH MINERALS) TABS tablet  . Multiple Vitamins-Minerals (AIRBORNE PO)  . OMEGA-3 KRILL OIL PO  . pantoprazole (PROTONIX) 40 MG tablet   . PENNSAID 2 % SOLN  . rivaroxaban (XARELTO) 20 MG TABS tablet  . rosuvastatin (CRESTOR) 20 MG tablet  . sertraline (ZOLOFT) 50 MG tablet  . tadalafil (CIALIS) 10 MG tablet  . thiamine (VITAMIN B-1) 100 MG tablet  . valsartan (DIOVAN) 160 MG tablet   No current facility-administered medications for this encounter.    [name] [facility] Pre-Surgical Testing [phone] 07/08/19  3:56 PM

## 2019-07-08 NOTE — Telephone Encounter (Signed)
Dietitian called pt to assess pts readiness for surgery. Utilized teach back method to ensure pt understood nutrition recommendations.   Attempted twice both times the phone just continued to ring.

## 2019-07-11 ENCOUNTER — Other Ambulatory Visit (HOSPITAL_COMMUNITY)
Admission: RE | Admit: 2019-07-11 | Discharge: 2019-07-11 | Disposition: A | Discharge status: Home / Self Care | Payer: Medicare Other | Source: Ambulatory Visit | Attending: Surgery | Admitting: Surgery

## 2019-07-11 DIAGNOSIS — Z01812 Encounter for preprocedural laboratory examination: Secondary | ICD-10-CM | POA: Diagnosis present

## 2019-07-11 DIAGNOSIS — Z20822 Contact with and (suspected) exposure to covid-19: Secondary | ICD-10-CM | POA: Diagnosis not present

## 2019-07-11 LAB — SARS CORONAVIRUS 2 (TAT 6-24 HRS): SARS Coronavirus 2: NEGATIVE

## 2019-07-14 MED ORDER — BUPIVACAINE LIPOSOME 1.3 % IJ SUSP
20.0000 mL | Freq: Once | INTRAMUSCULAR | Status: DC
  Filled 2019-07-14: qty 20

## 2019-07-14 NOTE — H&P (Signed)
Chief Complaint:  Need to lose weight to have right hip replacement  History of Present Illness:  Isaiah Jones is an 69 y.o. male who was seen last year for consideration of bariatric surgery.  He decided to purse sleeve gastrectomy and presents for that operation.    Past Medical History:  Diagnosis Date  . Arthritis, degenerative    hip,shoulders, hands, back  . 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 CHF (congestive heart failure) ([place])   . Colon polyps   . Colon polyps   . Complication of anesthesia   . Dyslipidemia   . Dysrhythmia 2017   A fib  . Family history of adverse reaction to anesthesia   . Glaucoma   . Hyperlipidemia   . Hypertension   . OSA on CPAP    C-Pap  . Persistent atrial fibrillation ([place])    Echo 3/18: Mild LVH, EF 55-60, normal wall motion, grade 1 diastolic dysfunction, mild LAE  . PONV (postoperative nausea and vomiting)   . Rhinitis, allergic     Past Surgical History:  Procedure Laterality Date  . arthroscopic knee surgery Bilateral 2003  . CARDIOVERSION N/A 08/10/2016   Procedure: CARDIOVERSION;  Surgeon: Pixie Casino, MD;  Location: [facility] ENDOSCOPY;  Service: Cardiovascular;  Laterality: N/A;  . REPLACEMENT TOTAL KNEE Bilateral 2008  . TONSILLECTOMY    . VASECTOMY      Current Facility-Administered Medications  Medication Dose Route Frequency Provider Last Rate Last Admin  . [START ON 07/15/2019] bupivacaine liposome (EXPAREL) 1.3 % injection 266 mg  20 mL Infiltration Once [name], MD       Current Outpatient Medications  Medication Sig Dispense Refill  . amLODipine (NORVASC) 5 MG tablet Take 1 tablet (5 mg total) by mouth daily. 90 tablet 1  . amoxicillin (AMOXIL) 500 MG capsule Take 2,000 mg by mouth See admin instructions. Take 4 capsules (2000 mg) by mouth 1 hour prior to dental procedure    . azelastine (ASTELIN) 0.1 % nasal spray Place 2 sprays into both nostrils 2 (two) times daily. (Patient  taking differently: Place 2 sprays into both nostrils 2 (two) times daily as needed (allergies/sneezing.). ) 30 mL 12  . diphenhydramine-acetaminophen (TYLENOL PM) 25-500 MG TABS tablet Take 2 tablets by mouth at bedtime.     . fexofenadine (ALLEGRA) 180 MG tablet Take 180 mg by mouth daily as needed for allergies or rhinitis.    . Ginkgo Biloba 60 MG TABS Take 60 mg by mouth daily.    . hydrochlorothiazide (HYDRODIURIL) 12.5 MG tablet Take 1 tablet (12.5 mg total) by mouth daily. 90 tablet 1  . indomethacin (INDOCIN) 50 MG capsule Take 50 mg by mouth daily as needed (gout pain.).     Marland Kitchen latanoprost (XALATAN) 0.005 % ophthalmic solution Place 1 drop into both eyes at bedtime.     . methocarbamol (ROBAXIN) 750 MG tablet Take 750 mg by mouth 2 (two) times daily as needed (back spasms.).     Marland Kitchen metoprolol succinate (TOPROL-XL) 50 MG 24 hr tablet Take 1 tablet daily with or immediately following a meal. ** DO NOT CRUSH **    (BETA BLOCKER). (Patient taking differently: Take 50 mg by mouth daily with supper. Take 1 tablet daily with or immediately following a meal. ** DO NOT CRUSH **    (BETA BLOCKER).) 90 tablet 3  . mometasone (NASONEX) 50 MCG/ACT nasal spray Place 2 sprays into the nose at bedtime.     Marland Kitchen  Multiple Vitamin (MULTIVITAMIN WITH MINERALS) TABS tablet Take 1 tablet by mouth daily. Centrum Silver    . Multiple Vitamins-Minerals (AIRBORNE PO) Take 1 tablet by mouth daily as needed (immune health).     . OMEGA-3 KRILL OIL PO Take 1 capsule by mouth daily.     . pantoprazole (PROTONIX) 40 MG tablet Take 1 tablet (40 mg total) by mouth 2 (two) times daily for 14 days. 28 tablet 0  . PENNSAID 2 % SOLN Apply 1 Pump topically 4 (four) times daily as needed (aching joints).   1  . rivaroxaban (XARELTO) 20 MG TABS tablet TAKE 1 TABLET DAILY WITH   SUPPER (Patient taking differently: Take 20 mg by mouth daily with supper. TAKE 1 TABLET DAILY WITH   SUPPE) 90 tablet 3  . rosuvastatin (CRESTOR) 20 MG tablet  Take 1 tablet (20 mg total) by mouth daily. 90 tablet 3  . sertraline (ZOLOFT) 50 MG tablet TAKE 1 TABLET DAILY (Patient taking differently: Take 50 mg by mouth daily after supper. ) 90 tablet 0  . tadalafil (CIALIS) 10 MG tablet Take 1 tablet (10 mg total) by mouth daily as needed for erectile dysfunction. 30 tablet 0  . thiamine (VITAMIN B-1) 100 MG tablet Take 100 mg by mouth daily.    . valsartan (DIOVAN) 160 MG tablet Take 1 tablet (160 mg total) by mouth daily. 90 tablet 1  . acetaminophen (TYLENOL) 500 MG tablet Take 500 mg by mouth every 6 (six) hours as needed (for pain.).     Marland Kitchen amLODIPine-Valsartan-HCTZ 5-160-12.5 MG TABS Take 1 tablet by mouth daily. (Patient not taking: Reported on 07/01/2019) 90 tablet 1   Patient has no known allergies. Family History  Problem Relation Age of Onset  . Alzheimer's disease Mother   . Heart attack Mother   . Hyperlipidemia Mother   . Hypertension Mother   . Arthritis Mother   . Heart attack Father 49  . Arthritis Father   . Hyperlipidemia Father   . Hypertension Father   . Hypertension Brother   . Hyperlipidemia Brother   . Other Brother        [place]  . Atrial fibrillation Neg Hx    Social History:   reports that he quit smoking about 18 years ago. His smoking use included cigarettes. He has never used smokeless tobacco. He reports current alcohol use. He reports that he does not use drugs.   REVIEW OF SYSTEMS : Negative except for AF, OSA  Physical Exam:   There were no vitals taken for this visit. There is no height or weight on file to calculate BMI.  Gen:  WDWN WM NAD  Neurological: Alert and oriented to person, place, and time. Motor and sensory function is grossly intact  Head: Normocephalic and atraumatic.  Eyes: Conjunctivae are normal. Pupils are equal, round, and reactive to light. No scleral icterus.  Neck: Normal range of motion. Neck supple. No tracheal deviation or thyromegaly present.  Cardiovascular:  SR  without murmurs or gallops.  No carotid bruits Breast:  Not examined Respiratory: Effort normal.  No respiratory distress. No chest wall tenderness. Breath sounds normal.  No wheezes, rales or rhonchi.  Abdomen:  nontender  GU:  Not examined Musculoskeletal: Normal range of motion. Extremities are nontender. No cyanosis, edema or clubbing noted Lymphadenopathy: No cervical, preauricular, postauricular or axillary adenopathy is present Skin: Skin is warm and dry. No rash noted. No diaphoresis. No erythema. No pallor. Pscyh: Normal mood and affect. Behavior  is normal. Judgment and thought content normal.   LABORATORY RESULTS: No results found for this or any previous visit (from the past 48 hour(s)).   RADIOLOGY RESULTS: No results found.  Problem List: Patient Active Problem List   Diagnosis Date Noted  . Vitamin D deficiency 06/16/2019  . Erectile dysfunction 12/05/2018  . Essential hypertension 12/05/2018  . Gout 12/05/2018  . Osteoarthritis 12/05/2018  . Seasonal allergies 12/05/2018  . 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 Morbid obesity ([place]) 12/05/2018  . Persistent atrial fibrillation ([place])   . OSA on CPAP   . Dyslipidemia     Assessment & Plan: Morbid obesity for lap sleeve gastrectomy    Matt B. Hassell Done, MD, [facility] [phone] beeper [phone]  07/14/2019 2:21 PM

## 2019-07-15 ENCOUNTER — Encounter (HOSPITAL_COMMUNITY)
Admission: RE | Disposition: A | Discharge status: Home / Self Care | Payer: Self-pay | Source: Home / Self Care | Attending: Surgery

## 2019-07-15 ENCOUNTER — Encounter (HOSPITAL_COMMUNITY): Payer: Self-pay | Admitting: Surgery

## 2019-07-15 ENCOUNTER — Other Ambulatory Visit: Payer: Self-pay

## 2019-07-15 ENCOUNTER — Inpatient Hospital Stay (HOSPITAL_COMMUNITY)
Admission: RE | Admit: 2019-07-15 | Discharge: 2019-07-16 | DRG: 620 | Disposition: A | Discharge status: Home / Self Care | Payer: Medicare Other | Attending: Surgery | Admitting: Surgery

## 2019-07-15 ENCOUNTER — Inpatient Hospital Stay (HOSPITAL_COMMUNITY): Payer: Medicare Other | Admitting: Physician Assistant

## 2019-07-15 ENCOUNTER — Inpatient Hospital Stay (HOSPITAL_COMMUNITY): Payer: Medicare Other | Admitting: Anesthesiology

## 2019-07-15 DIAGNOSIS — Z7901 Long term (current) use of anticoagulants: Secondary | ICD-10-CM | POA: Diagnosis not present

## 2019-07-15 DIAGNOSIS — Z87891 Personal history of nicotine dependence: Secondary | ICD-10-CM | POA: Diagnosis not present

## 2019-07-15 DIAGNOSIS — Z9884 Bariatric surgery status: Secondary | ICD-10-CM

## 2019-07-15 DIAGNOSIS — Z6841 Body Mass Index (BMI) 40.0 and over, adult: Secondary | ICD-10-CM | POA: Diagnosis not present

## 2019-07-15 DIAGNOSIS — E559 Vitamin D deficiency, unspecified: Secondary | ICD-10-CM | POA: Diagnosis present

## 2019-07-15 DIAGNOSIS — G4733 Obstructive sleep apnea (adult) (pediatric): Secondary | ICD-10-CM | POA: Diagnosis present

## 2019-07-15 DIAGNOSIS — E785 Hyperlipidemia, unspecified: Secondary | ICD-10-CM | POA: Diagnosis present

## 2019-07-15 DIAGNOSIS — I4819 Other persistent atrial fibrillation: Secondary | ICD-10-CM | POA: Diagnosis present

## 2019-07-15 DIAGNOSIS — Z86010 Personal history of colonic polyps: Secondary | ICD-10-CM | POA: Diagnosis not present

## 2019-07-15 DIAGNOSIS — M109 Gout, unspecified: Secondary | ICD-10-CM | POA: Diagnosis present

## 2019-07-15 DIAGNOSIS — Z820 Family history of epilepsy and other diseases of the nervous system: Secondary | ICD-10-CM

## 2019-07-15 DIAGNOSIS — I110 Hypertensive heart disease with heart failure: Secondary | ICD-10-CM | POA: Diagnosis present

## 2019-07-15 DIAGNOSIS — H409 Unspecified glaucoma: Secondary | ICD-10-CM | POA: Diagnosis present

## 2019-07-15 DIAGNOSIS — Z8349 Family history of other endocrine, nutritional and metabolic diseases: Secondary | ICD-10-CM

## 2019-07-15 DIAGNOSIS — M1990 Unspecified osteoarthritis, unspecified site: Secondary | ICD-10-CM | POA: Diagnosis present

## 2019-07-15 DIAGNOSIS — Z8249 Family history of ischemic heart disease and other diseases of the circulatory system: Secondary | ICD-10-CM | POA: Diagnosis not present

## 2019-07-15 DIAGNOSIS — Z79899 Other long term (current) drug therapy: Secondary | ICD-10-CM

## 2019-07-15 DIAGNOSIS — Z96653 Presence of artificial knee joint, bilateral: Secondary | ICD-10-CM | POA: Diagnosis present

## 2019-07-15 DIAGNOSIS — Z8261 Family history of arthritis: Secondary | ICD-10-CM | POA: Diagnosis not present

## 2019-07-15 HISTORY — PX: LAPAROSCOPIC GASTRIC SLEEVE RESECTION: SHX5895

## 2019-07-15 LAB — CBC
HCT: 48.3 % (ref 39.0–52.0)
Hemoglobin: 16.2 g/dL (ref 13.0–17.0)
MCH: 32.3 pg (ref 26.0–34.0)
MCHC: 33.5 g/dL (ref 30.0–36.0)
MCV: 96.4 fL (ref 80.0–100.0)
Platelets: 165 10*3/uL (ref 150–400)
RBC: 5.01 MIL/uL (ref 4.22–5.81)
RDW: 13.2 % (ref 11.5–15.5)
WBC: 8.8 10*3/uL (ref 4.0–10.5)
nRBC: 0 % (ref 0.0–0.2)

## 2019-07-15 LAB — TYPE AND SCREEN
ABO/RH(D): O NEG
Antibody Screen: NEGATIVE

## 2019-07-15 LAB — HEMOGLOBIN AND HEMATOCRIT, BLOOD
HCT: 48.9 % (ref 39.0–52.0)
Hemoglobin: 16.1 g/dL (ref 13.0–17.0)

## 2019-07-15 LAB — CREATININE, SERUM
Creatinine, Ser: 0.83 mg/dL (ref 0.61–1.24)
GFR calc Af Amer: >60 mL/min (ref >60)
GFR calc non Af Amer: >60 mL/min (ref >60)

## 2019-07-15 SURGERY — GASTRECTOMY, SLEEVE, LAPAROSCOPIC
Anesthesia: General

## 2019-07-15 MED ORDER — SUGAMMADEX SODIUM 500 MG/5ML IV SOLN
INTRAVENOUS | Status: DC | PRN
  Administered 2019-07-15: 400 mg via INTRAVENOUS

## 2019-07-15 MED ORDER — KETAMINE HCL 10 MG/ML IJ SOLN
INTRAMUSCULAR | Status: AC
  Filled 2019-07-15: qty 1

## 2019-07-15 MED ORDER — ONDANSETRON HCL 4 MG/2ML IJ SOLN
INTRAMUSCULAR | Status: DC | PRN
  Administered 2019-07-15: 4 mg via INTRAVENOUS

## 2019-07-15 MED ORDER — PROPOFOL 10 MG/ML IV BOLUS
INTRAVENOUS | Status: AC
  Filled 2019-07-15: qty 20

## 2019-07-15 MED ORDER — 0.9 % SODIUM CHLORIDE (POUR BTL) OPTIME
TOPICAL | Status: DC | PRN
  Administered 2019-07-15: 1000 mL

## 2019-07-15 MED ORDER — HEPARIN SODIUM (PORCINE) 5000 UNIT/ML IJ SOLN
5000.0000 [IU] | INTRAMUSCULAR | Status: AC
  Administered 2019-07-15: 5000 [IU] via SUBCUTANEOUS
  Filled 2019-07-15: qty 1

## 2019-07-15 MED ORDER — ACETAMINOPHEN 500 MG PO TABS
1000.0000 mg | ORAL_TABLET | Freq: Three times a day (TID) | ORAL | Status: DC
  Administered 2019-07-16 (×2): 1000 mg via ORAL
  Filled 2019-07-15 (×2): qty 2

## 2019-07-15 MED ORDER — ROCURONIUM BROMIDE 100 MG/10ML IV SOLN
INTRAVENOUS | Status: DC | PRN
  Administered 2019-07-15: 20 mg via INTRAVENOUS
  Administered 2019-07-15: 60 mg via INTRAVENOUS
  Administered 2019-07-15: 10 mg via INTRAVENOUS

## 2019-07-15 MED ORDER — SODIUM CHLORIDE (PF) 0.9 % IJ SOLN
INTRAMUSCULAR | Status: AC
  Filled 2019-07-15: qty 10

## 2019-07-15 MED ORDER — CHLORHEXIDINE GLUCONATE CLOTH 2 % EX PADS: 6.0000 | MEDICATED_PAD | Freq: Once | CUTANEOUS | Status: DC

## 2019-07-15 MED ORDER — PROPOFOL 10 MG/ML IV BOLUS
INTRAVENOUS | Status: DC | PRN
  Administered 2019-07-15: 200 mg via INTRAVENOUS

## 2019-07-15 MED ORDER — OXYCODONE HCL 5 MG/5ML PO SOLN
5.0000 mg | Freq: Four times a day (QID) | ORAL | Status: DC | PRN
  Administered 2019-07-15 – 2019-07-16 (×3): 5 mg via ORAL
  Filled 2019-07-15 (×3): qty 5

## 2019-07-15 MED ORDER — FENTANYL CITRATE (PF) 250 MCG/5ML IJ SOLN
INTRAMUSCULAR | Status: AC
  Filled 2019-07-15: qty 5

## 2019-07-15 MED ORDER — LACTATED RINGERS IV SOLN: INTRAVENOUS | Status: DC

## 2019-07-15 MED ORDER — BUPIVACAINE LIPOSOME 1.3 % IJ SUSP
INTRAMUSCULAR | Status: DC | PRN
  Administered 2019-07-15: 20 mL

## 2019-07-15 MED ORDER — KETAMINE HCL 10 MG/ML IJ SOLN
INTRAMUSCULAR | Status: DC | PRN
  Administered 2019-07-15: 40 mg via INTRAVENOUS

## 2019-07-15 MED ORDER — HEPARIN SODIUM (PORCINE) 5000 UNIT/ML IJ SOLN
5000.0000 [IU] | Freq: Three times a day (TID) | INTRAMUSCULAR | Status: DC
  Administered 2019-07-15 – 2019-07-16 (×3): 5000 [IU] via SUBCUTANEOUS
  Filled 2019-07-15 (×3): qty 1

## 2019-07-15 MED ORDER — SUCCINYLCHOLINE CHLORIDE 20 MG/ML IJ SOLN
INTRAMUSCULAR | Status: DC | PRN
  Administered 2019-07-15 (×2): 140 mg via INTRAVENOUS

## 2019-07-15 MED ORDER — SUGAMMADEX SODIUM 500 MG/5ML IV SOLN
INTRAVENOUS | Status: AC
  Filled 2019-07-15: qty 5

## 2019-07-15 MED ORDER — APREPITANT 40 MG PO CAPS
40.0000 mg | ORAL_CAPSULE | ORAL | Status: AC
  Administered 2019-07-15: 40 mg via ORAL
  Filled 2019-07-15: qty 1

## 2019-07-15 MED ORDER — FENTANYL CITRATE (PF) 250 MCG/5ML IJ SOLN
INTRAMUSCULAR | Status: DC | PRN
  Administered 2019-07-15 (×2): 50 ug via INTRAVENOUS
  Administered 2019-07-15: 100 ug via INTRAVENOUS

## 2019-07-15 MED ORDER — MIDAZOLAM HCL 2 MG/2ML IJ SOLN
INTRAMUSCULAR | Status: AC
  Filled 2019-07-15: qty 2

## 2019-07-15 MED ORDER — LACTATED RINGERS IR SOLN
Status: DC | PRN
  Administered 2019-07-15: 1000 mL

## 2019-07-15 MED ORDER — GLYCOPYRROLATE 0.2 MG/ML IJ SOLN: INTRAMUSCULAR | Status: DC | PRN

## 2019-07-15 MED ORDER — LIDOCAINE 2% (20 MG/ML) 5 ML SYRINGE
INTRAMUSCULAR | Status: AC
  Filled 2019-07-15: qty 5

## 2019-07-15 MED ORDER — PANTOPRAZOLE SODIUM 40 MG IV SOLR
40.0000 mg | Freq: Every day | INTRAVENOUS | Status: DC
  Administered 2019-07-15: 40 mg via INTRAVENOUS
  Filled 2019-07-15: qty 40

## 2019-07-15 MED ORDER — LIDOCAINE 20MG/ML (2%) 15 ML SYRINGE OPTIME
INTRAMUSCULAR | Status: DC | PRN
  Administered 2019-07-15: 1.5 mg/kg/h via INTRAVENOUS

## 2019-07-15 MED ORDER — HYDROMORPHONE HCL 1 MG/ML IJ SOLN: 0.2500 mg | INTRAMUSCULAR | Status: DC | PRN

## 2019-07-15 MED ORDER — ONDANSETRON HCL 4 MG/2ML IJ SOLN: 4.0000 mg | INTRAMUSCULAR | Status: DC | PRN

## 2019-07-15 MED ORDER — LIDOCAINE HCL 2 % IJ SOLN
INTRAMUSCULAR | Status: AC
  Filled 2019-07-15: qty 20

## 2019-07-15 MED ORDER — KCL IN DEXTROSE-NACL 20-5-0.45 MEQ/L-%-% IV SOLN
INTRAVENOUS | Status: DC
  Filled 2019-07-15 (×2): qty 1000

## 2019-07-15 MED ORDER — DEXAMETHASONE SODIUM PHOSPHATE 10 MG/ML IJ SOLN
INTRAMUSCULAR | Status: AC
  Filled 2019-07-15: qty 1

## 2019-07-15 MED ORDER — GLYCOPYRROLATE 0.2 MG/ML IJ SOLN
INTRAMUSCULAR | Status: DC | PRN
  Administered 2019-07-15: .2 mg via INTRAVENOUS

## 2019-07-15 MED ORDER — SODIUM CHLORIDE 0.9 % IV SOLN
2.0000 g | INTRAVENOUS | Status: AC
  Administered 2019-07-15: 2 g via INTRAVENOUS
  Filled 2019-07-15: qty 2

## 2019-07-15 MED ORDER — FENTANYL CITRATE (PF) 100 MCG/2ML IJ SOLN
INTRAMUSCULAR | Status: AC
  Filled 2019-07-15: qty 4

## 2019-07-15 MED ORDER — SODIUM CHLORIDE (PF) 0.9 % IJ SOLN
INTRAMUSCULAR | Status: DC | PRN
  Administered 2019-07-15: 10 mL

## 2019-07-15 MED ORDER — METOPROLOL TARTRATE 5 MG/5ML IV SOLN: 5.0000 mg | Freq: Four times a day (QID) | INTRAVENOUS | Status: DC | PRN

## 2019-07-15 MED ORDER — GABAPENTIN 300 MG PO CAPS
300.0000 mg | ORAL_CAPSULE | ORAL | Status: AC
  Administered 2019-07-15: 300 mg via ORAL
  Filled 2019-07-15: qty 1

## 2019-07-15 MED ORDER — ACETAMINOPHEN 500 MG PO TABS
1000.0000 mg | ORAL_TABLET | ORAL | Status: AC
  Administered 2019-07-15: 1000 mg via ORAL
  Filled 2019-07-15: qty 2

## 2019-07-15 MED ORDER — HYDROMORPHONE HCL 1 MG/ML IJ SOLN
0.2500 mg | INTRAMUSCULAR | Status: DC | PRN
  Administered 2019-07-15: 0.5 mg via INTRAVENOUS

## 2019-07-15 MED ORDER — LIDOCAINE HCL (CARDIAC) PF 100 MG/5ML IV SOSY
PREFILLED_SYRINGE | INTRAVENOUS | Status: DC | PRN
  Administered 2019-07-15: 60 mg via INTRAVENOUS

## 2019-07-15 MED ORDER — HYDROMORPHONE HCL 1 MG/ML IJ SOLN
INTRAMUSCULAR | Status: AC
  Administered 2019-07-15: 0.5 mg via INTRAVENOUS
  Filled 2019-07-15: qty 2

## 2019-07-15 MED ORDER — MORPHINE SULFATE (PF) 4 MG/ML IV SOLN
1.0000 mg | INTRAVENOUS | Status: DC | PRN
  Administered 2019-07-15: 2 mg via INTRAVENOUS
  Filled 2019-07-15: qty 1

## 2019-07-15 MED ORDER — MIDAZOLAM HCL 5 MG/5ML IJ SOLN
INTRAMUSCULAR | Status: DC | PRN
  Administered 2019-07-15: 2 mg via INTRAVENOUS

## 2019-07-15 MED ORDER — DEXAMETHASONE SODIUM PHOSPHATE 10 MG/ML IJ SOLN
INTRAMUSCULAR | Status: DC | PRN
  Administered 2019-07-15: 8 mg via INTRAVENOUS

## 2019-07-15 MED ORDER — ENSURE MAX PROTEIN PO LIQD
2.0000 [oz_av] | ORAL | Status: DC
  Administered 2019-07-16 (×3): 2 [oz_av] via ORAL

## 2019-07-15 MED ORDER — LATANOPROST 0.005 % OP SOLN
1.0000 [drp] | Freq: Every day | OPHTHALMIC | Status: DC
  Filled 2019-07-15: qty 2.5

## 2019-07-15 MED ORDER — ONDANSETRON HCL 4 MG/2ML IJ SOLN
INTRAMUSCULAR | Status: AC
  Filled 2019-07-15: qty 2

## 2019-07-15 MED ORDER — ACETAMINOPHEN 160 MG/5ML PO SOLN
1000.0000 mg | Freq: Three times a day (TID) | ORAL | Status: DC
  Administered 2019-07-15: 1000 mg via ORAL
  Filled 2019-07-15: qty 40.6

## 2019-07-15 MED ORDER — ROCURONIUM BROMIDE 10 MG/ML (PF) SYRINGE
PREFILLED_SYRINGE | INTRAVENOUS | Status: AC
  Filled 2019-07-15: qty 10

## 2019-07-15 MED ORDER — SCOPOLAMINE 1 MG/3DAYS TD PT72
1.0000 | MEDICATED_PATCH | TRANSDERMAL | Status: DC
  Administered 2019-07-15: 1.5 mg via TRANSDERMAL
  Filled 2019-07-15: qty 1

## 2019-07-15 MED ORDER — EPHEDRINE SULFATE 50 MG/ML IJ SOLN
INTRAMUSCULAR | Status: DC | PRN
  Administered 2019-07-15: 15 mg via INTRAVENOUS

## 2019-07-15 SURGICAL SUPPLY — 65 items
APPLICATOR COTTON TIP 6 STRL (MISCELLANEOUS) IMPLANT
APPLICATOR COTTON TIP 6IN STRL (MISCELLANEOUS) ×3
APPLIER CLIP 5 13 M/L LIGAMAX5 (MISCELLANEOUS)
APPLIER CLIP ROT 10 11.4 M/L (STAPLE)
APPLIER CLIP ROT 13.4 12 LRG (CLIP) ×3
BLADE SURG 15 STRL LF DISP TIS (BLADE) ×1 IMPLANT
BLADE SURG 15 STRL SS (BLADE) ×2
CABLE HIGH FREQUENCY MONO STRZ (ELECTRODE) ×3 IMPLANT
CLIP APPLIE 5 13 M/L LIGAMAX5 (MISCELLANEOUS) IMPLANT
CLIP APPLIE ROT 10 11.4 M/L (STAPLE) IMPLANT
CLIP APPLIE ROT 13.4 12 LRG (CLIP) IMPLANT
COVER WAND RF STERILE (DRAPES) IMPLANT
DERMABOND ADVANCED (GAUZE/BANDAGES/DRESSINGS) ×2
DERMABOND ADVANCED .7 DNX12 (GAUZE/BANDAGES/DRESSINGS) IMPLANT
DEVICE SUT QUICK LOAD TK 5 (STAPLE) IMPLANT
DEVICE SUT TI-KNOT TK 5X26 (MISCELLANEOUS) IMPLANT
DEVICE SUTURE ENDOST 10MM (ENDOMECHANICALS) IMPLANT
DEVICE TI KNOT TK5 (MISCELLANEOUS)
DISSECTOR BLUNT TIP ENDO 5MM (MISCELLANEOUS) IMPLANT
ELECT REM PT RETURN 15FT ADLT (MISCELLANEOUS) ×3 IMPLANT
GAUZE SPONGE 4X4 12PLY STRL (GAUZE/BANDAGES/DRESSINGS) IMPLANT
GLOVE BIOGEL M 8.0 STRL (GLOVE) ×3 IMPLANT
GOWN STRL REUS W/TWL XL LVL3 (GOWN DISPOSABLE) ×12 IMPLANT
GRASPER SUT TROCAR 14GX15 (MISCELLANEOUS) ×3 IMPLANT
HANDLE STAPLE EGIA 4 XL (STAPLE) ×3 IMPLANT
HOVERMATT SINGLE USE (MISCELLANEOUS) ×3 IMPLANT
KIT BASIN OR (CUSTOM PROCEDURE TRAY) ×3 IMPLANT
KIT TURNOVER KIT A (KITS) IMPLANT
MARKER SKIN DUAL TIP RULER LAB (MISCELLANEOUS) ×3 IMPLANT
NDL SPNL 22GX3.5 QUINCKE BK (NEEDLE) ×1 IMPLANT
NEEDLE SPNL 22GX3.5 QUINCKE BK (NEEDLE) ×3 IMPLANT
PACK CARDIOVASCULAR III (CUSTOM PROCEDURE TRAY) ×3 IMPLANT
PENCIL SMOKE EVACUATOR (MISCELLANEOUS) IMPLANT
QUICK LOAD TK 5 (STAPLE)
RELOAD STAPLE 45 PURP MED/THCK (STAPLE) IMPLANT
RELOAD TRI 45 ART MED THCK BLK (STAPLE) ×3 IMPLANT
RELOAD TRI 45 ART MED THCK PUR (STAPLE) ×3 IMPLANT
RELOAD TRI 60 ART MED THCK BLK (STAPLE) ×5 IMPLANT
RELOAD TRI 60 ART MED THCK PUR (STAPLE) ×4 IMPLANT
SCISSORS LAP 5X45 EPIX DISP (ENDOMECHANICALS) IMPLANT
SET IRRIG TUBING LAPAROSCOPIC (IRRIGATION / IRRIGATOR) ×3 IMPLANT
SET TUBE SMOKE EVAC HIGH FLOW (TUBING) ×3 IMPLANT
SHEARS HARMONIC ACE PLUS 45CM (MISCELLANEOUS) ×3 IMPLANT
SLEEVE ADV FIXATION 5X100MM (TROCAR) ×6 IMPLANT
SLEEVE GASTRECTOMY 36FR VISIGI (MISCELLANEOUS) ×3 IMPLANT
SOL ANTI FOG 6CC (MISCELLANEOUS) ×1 IMPLANT
SOLUTION ANTI FOG 6CC (MISCELLANEOUS) ×2
SPONGE LAP 18X18 RF (DISPOSABLE) ×3 IMPLANT
STAPLER VISISTAT 35W (STAPLE) ×3 IMPLANT
SUT MNCRL AB 4-0 PS2 18 (SUTURE) ×6 IMPLANT
SUT SURGIDAC NAB ES-9 0 48 120 (SUTURE) IMPLANT
SUT VICRYL 0 TIES 12 18 (SUTURE) ×3 IMPLANT
SYR 10ML ECCENTRIC (SYRINGE) ×3 IMPLANT
SYR 20ML LL LF (SYRINGE) ×3 IMPLANT
SYR 50ML LL SCALE MARK (SYRINGE) ×3 IMPLANT
TOWEL OR 17X26 10 PK STRL BLUE (TOWEL DISPOSABLE) ×3 IMPLANT
TOWEL OR NON WOVEN STRL DISP B (DISPOSABLE) ×3 IMPLANT
TRAY FOLEY MTR SLVR 16FR STAT (SET/KITS/TRAYS/PACK) IMPLANT
TROCAR ADV FIXATION 5X100MM (TROCAR) ×3 IMPLANT
TROCAR BLADELESS 15MM (ENDOMECHANICALS) ×3 IMPLANT
TROCAR BLADELESS OPT 5 100 (ENDOMECHANICALS) ×3 IMPLANT
TUBE CALIBRATION LAPBAND (TUBING) IMPLANT
TUBING CONNECTING 10 (TUBING) ×2 IMPLANT
TUBING CONNECTING 10' (TUBING) ×1
TUBING ENDO SMARTCAP (MISCELLANEOUS) ×3 IMPLANT

## 2019-07-15 NOTE — Op Note (Signed)
15 July 2019  Surgeon: Kaylyn Lim, MD, FACS  Asst:  Romana Juniper, MD, FACS  Anes:  General endotracheal  Procedure: Laparoscopic sleeve gastrectomy and upper endoscopy  Diagnosis: Morbid obesity  Complications: None noted  EBL:   minimal cc  Description of Procedure:  The patient was take to OR 1 and given general anesthesia.  The abdomen was prepped with Technicare and draped sterilely.  A timeout was performed.  Access to the abdomen was achieved with a 5 mm Optiview through the left upper quadrant.  Following insufflation, the state of the abdomen was found to be free of adhesions.  The ViSiGi 36Fr tube was inserted to deflate the stomach and was pulled back into the esophagus.    The pylorus was identified and we measured 5 cm back and marked the antrum.  At that point we began dissection to take down the greater curvature of the stomach using the Harmonic scalpel.  This dissection was taken all the way up to the left crus.  Posterior attachments of the stomach were also taken down.  Bleeding in omental attachments half way up the stomach were controlled with clip applier.  The ViSiGi tube was then passed into the antrum and suction applied so that it was snug along the lessor curvature.  The "crow's foot" or incisura was identified.  The sleeve gastrectomy was begun using the [employer] stapler beginning with a black 4.5 cm with TRS this was followed by a two 6cm black loads with TRS and then 6 cm purples with TRS.  When the sleeve was complete the tube was taken off suction and insufflated briefly.  The tube was withdrawn.  Upper endoscopy was then performed by Dr. Kae Heller.  .     The specimen was extracted through the 15 trocar site.  Local was provided by infiltrating with Exparel like a tap block and closed 4-0 Monocryl and Dermabond.    Matt B. Hassell Done, [place], [facility], [place]

## 2019-07-15 NOTE — Anesthesia Procedure Notes (Signed)
Procedure Name: Intubation Date/Time: 07/15/2019 12:23 PM Performed by: Glory Buff, CRNA Pre-anesthesia Checklist: Patient identified, Emergency Drugs available, Suction available and Patient being monitored Patient Re-evaluated:Patient Re-evaluated prior to induction Oxygen Delivery Method: Circle system utilized Preoxygenation: Pre-oxygenation with 100% oxygen Induction Type: IV induction Ventilation: Mask ventilation without difficulty Laryngoscope Size: Miller and 3 Grade View: Grade I Tube type: Oral Tube size: 7.5 mm Number of attempts: 1 Airway Equipment and Method: Stylet and Oral airway Placement Confirmation: ETT inserted through vocal cords under direct vision,  positive ETCO2 and breath sounds checked- equal and bilateral Secured at: 22 cm Tube secured with: Tape Dental Injury: Teeth and Oropharynx as per pre-operative assessment

## 2019-07-15 NOTE — Anesthesia Postprocedure Evaluation (Signed)
Anesthesia Post Note  Patient: Isaiah Jones  Procedure(s) Performed: LAPAROSCOPIC GASTRIC SLEEVE RESECTION, Upper Endo, ERAS Pathway (N/A )     Patient location during evaluation: PACU Anesthesia Type: General Level of consciousness: awake and alert Pain management: pain level controlled Vital Signs Assessment: post-procedure vital signs reviewed and stable Respiratory status: spontaneous breathing, nonlabored ventilation, respiratory function stable and patient connected to nasal cannula oxygen Cardiovascular status: blood pressure returned to baseline and stable Postop Assessment: no apparent nausea or vomiting Anesthetic complications: no    Last Vitals:  Vitals:   07/15/19 1500 07/15/19 1515  BP: (!) 147/79 133/79  Pulse: 79 62  Resp: 16 12  Temp:    SpO2: 100% 95%    Last Pain:  Vitals:   07/15/19 1515  TempSrc:   PainSc: 5                  Bianco Cange,W. EDMOND

## 2019-07-15 NOTE — Discharge Instructions (Signed)
° ° ° °GASTRIC BYPASS/SLEEVE ° Home Care Instructions ° ° These instructions are to help you care for yourself when you go home. ° °Call: If you have any problems. °• Call [phone] and ask for the surgeon on call °• If you need immediate help, come to the ER at [facility].  °• Tell the ER staff that you are a new post-op gastric bypass or gastric sleeve patient °  °Signs and symptoms to report: • Severe vomiting or nausea °o If you cannot keep down clear liquids for longer than 1 day, call your surgeon  °• Abdominal pain that does not get better after taking your pain medication °• Fever over 100.4° F with chills °• Heart beating over 100 beats a minute °• Shortness of breath at rest °• Chest pain °•  Redness, swelling, drainage, or foul odor at incision (surgical) sites °•  If your incisions open or pull apart °• Swelling or pain in calf (lower leg) °• Diarrhea (Loose bowel movements that happen often), frequent watery, uncontrolled bowel movements °• Constipation, (no bowel movements for 3 days) if this happens: Pick one °o Milk of Magnesia, 2 tablespoons by mouth, 3 times a day for 2 days if needed °o Stop taking Milk of Magnesia once you have a bowel movement °o Call your doctor if constipation continues °Or °o Miralax  (instead of Milk of Magnesia) following the label instructions °o Stop taking Miralax once you have a bowel movement °o Call your doctor if constipation continues °• Anything you think is not normal °  °Normal side effects after surgery: • Unable to sleep at night or unable to focus °• Irritability or moody °• Being tearful (crying) or depressed °These are common complaints, possibly related to your anesthesia medications that put you to sleep, stress of surgery, and change in lifestyle.  This usually goes away a few weeks after surgery.  If these feelings continue, call your primary care doctor. °  °Wound Care: You may have surgical glue, steri-strips, or staples over your incisions after  surgery °• Surgical glue:  Looks like a clear film over your incisions and will wear off a little at a time °• Steri-strips: Strips of tape over your incisions. You may notice a yellowish color on the skin under the steri-strips. This is used to make the   steri-strips stick better. Do not pull the steri-strips off - let them fall off °• Staples: Staples may be removed before you leave the hospital °o If you go home with staples, call [facility], [phone] at for an appointment with your surgeon’s nurse to have staples removed 10 days after surgery. °• Showering: You may shower two (2) days after your surgery unless your surgeon tells you differently °o Wash gently around incisions with warm soapy water, rinse well, and gently pat dry  °o No tub baths until staples are removed, steri-strips fall off or glue is gone.  °  °Medications: • Medications should be liquid or crushed if larger than the size of a dime °• Extended release pills (medication that release a little bit at a time through the day) should NOT be crushed or cut. (examples include XL, ER, DR, SR) °• Depending on the size and number of medications you take, you may need to space (take a few throughout the day)/change the time you take your medications so that you do not over-fill your pouch (smaller stomach) °• Make sure you follow-up with your primary care doctor to   make medication changes needed during rapid weight loss and life-style changes °• If you have diabetes, follow up with the doctor that orders your diabetes medication(s) within one week after surgery and check your blood sugar regularly. °• Do not drive while taking prescription pain medication  °• It is ok to take Tylenol by the bottle instructions with your pain medicine or instead of your pain medicine as needed.  DO NOT TAKE NSAIDS (EXAMPLES OF NSAIDS:  IBUPROFREN/ NAPROXEN)  °Diet:                    First 2 Weeks ° You will see the dietician t about two (2) weeks  after your surgery. The dietician will increase the types of foods you can eat if you are handling liquids well: °• If you have severe vomiting or nausea and cannot keep down clear liquids lasting longer than 1 day, call your surgeon @ ([phone]) °Protein Shake °• Drink at least 2 ounces of shake 5-6 times per day °• Each serving of protein shakes (usually 8 - 12 ounces) should have: °o 15 grams of protein  °o And no more than 5 grams of carbohydrate  °• Goal for protein each day: °o Men = 80 grams per day °o Women = 60 grams per day °• Protein powder may be added to fluids such as non-fat milk or Lactaid milk or unsweetened Soy/Almond milk (limit to 35 grams added protein powder per serving) ° °Hydration °• Slowly increase the amount of water and other clear liquids as tolerated (See Acceptable Fluids) °• Slowly increase the amount of protein shake as tolerated  °•  Sip fluids slowly and throughout the day.  Do not use straws. °• May use sugar substitutes in small amounts (no more than 6 - 8 packets per day; i.e. Splenda) ° °Fluid Goal °• The first goal is to drink at least 8 ounces of protein shake/drink per day (or as directed by the nutritionist); some examples of protein shakes are Syntrax Nectar, Adkins Advantage, EAS Edge HP, and Unjury. See handout from pre-op Bariatric Education Class: °o Slowly increase the amount of protein shake you drink as tolerated °o You may find it easier to slowly sip shakes throughout the day °o It is important to get your proteins in first °• Your fluid goal is to drink 64 - 100 ounces of fluid daily °o It may take a few weeks to build up to this °• 32 oz (or more) should be clear liquids  °And  °• 32 oz (or more) should be full liquids (see below for examples) °• Liquids should not contain sugar, caffeine, or carbonation ° °Clear Liquids: °• Water or Sugar-free flavored water (i.e. Fruit H2O, Propel) °• Decaffeinated coffee or tea (sugar-free) °• Crystal Lite, Wyler’s Lite,  Minute Maid Lite °• Sugar-free Jell-O °• Bouillon or broth °• Sugar-free Popsicle:   *Less than 20 calories each; Limit 1 per day ° °Full Liquids: °Protein Shakes/Drinks + 2 choices per day of other full liquids °• Full liquids must be: °o No More Than 15 grams of Carbs per serving  °o No More Than 3 grams of Fat per serving °• Strained low-fat cream soup (except Cream of Potato or Tomato) °• Non-Fat milk °• Fat-free Lactaid Milk °• Unsweetened Soy Or Unsweetened Almond Milk °• Low Sugar yogurt (Dannon Lite & Fit, Greek yogurt; Oikos Triple Zero; Chobani Simply 100; Yoplait 100 calorie Greek - No Fruit on the Bottom) ° °  °Vitamins   and Minerals • Start 1 day after surgery unless otherwise directed by your surgeon °• 2 Chewable Bariatric Specific Multivitamin / Multimineral Supplement with iron (Example: Bariatric Advantage Multi EA) °• Chewable Calcium with Vitamin D-3 °(Example: 3 Chewable Calcium Plus 600 with Vitamin D-3) °o Take 500 mg three (3) times a day for a total of 1500 mg each day °o Do not take all 3 doses of calcium at one time as it may cause constipation, and you can only absorb 500 mg  at a time  °o Do not mix multivitamins containing iron with calcium supplements; take 2 hours apart °• Menstruating women and those with a history of anemia (a blood disease that causes weakness) may need extra iron °o Talk with your doctor to see if you need more iron °• Do not stop taking or change any vitamins or minerals until you talk to your dietitian or surgeon °• Your Dietitian and/or surgeon must approve all vitamin and mineral supplements °  °Activity and Exercise: Limit your physical activity as instructed by your doctor.  It is important to continue walking at home.  During this time, use these guidelines: °• Do not lift anything greater than ten (10) pounds for at least two (2) weeks °• Do not go back to work or drive until your surgeon says you can °• You may have sex when you feel comfortable  °o It is  VERY important for male patients to use a reliable birth control method; fertility often increases after surgery  °o All hormonal birth control will be ineffective for 30 days after surgery due to medications given during surgery a barrier method must be used. °o Do not get pregnant for at least 18 months °• Start exercising as soon as your doctor tells you that you can °o Make sure your doctor approves any physical activity °• Start with a simple walking program °• Walk 5-15 minutes each day, 7 days per week.  °• Slowly increase until you are walking 30-45 minutes per day °Consider joining our BELT program. [phone] or email belt@[employer] °  °Special Instructions Things to remember: °• Use your CPAP when sleeping if this applies to you ° °• [facility] has two free Bariatric Surgery Support Groups that meet monthly °o The 3rd Thursday of each month, 6 pm, [facility]  °o The 2nd Friday of each month, 11:45 am in the private dining room in the basement of [facility] °• It is very important to keep all follow up appointments with your surgeon, dietitian, primary care physician, and behavioral health practitioner °• Routine follow up schedule with your surgeon include appointments at 2-3 weeks, 6-8 weeks, 6 months, and 1 year at a minimum.  Your surgeon may request to see you more often.   °o After the first year, please follow up with your bariatric surgeon and dietitian at least once a year in order to maintain best weight loss results °[facility]: [phone] °[facility]: [phone] °Bariatric Nurse Coordinator: [phone] °  °   Reviewed and Endorsed  °by [facility] Patient Education Committee, June, 2016 °Edits Approved: Aug, 2018 ° ° ° °

## 2019-07-15 NOTE — Interval H&P Note (Signed)
History and Physical Interval Note:  07/15/2019 11:35 AM  Isaiah Jones  has presented today for surgery, with the diagnosis of Morbid Obesity, OSA, HTN, H/O A-Fib.  The various methods of treatment have been discussed with the patient and family. After consideration of risks, benefits and other options for treatment, the patient has consented to  Procedure(s): LAPAROSCOPIC GASTRIC SLEEVE RESECTION, Upper Endo, ERAS Pathway (N/A) as a surgical intervention.  The patient's history has been reviewed, patient examined, no change in status, stable for surgery.  I have reviewed the patient's chart and labs.  Questions were answered to the patient's satisfaction.     Pedro Earls

## 2019-07-15 NOTE — Progress Notes (Signed)
NUTRITION NOTE  Consult for bariatric diet teaching per DROP protocol received. Nurse Educator for the bariatric program to provide all post-op/pre-discharge teaching to include diet-related education.   Should additional nutrition-related needs arise, please re-consult.    Jarome Matin, MS, RD, LDN, CNSC Inpatient Clinical Dietitian RD pager # available in [facility]  After hours/weekend pager # available in [facility]

## 2019-07-15 NOTE — Progress Notes (Signed)
Patient just arrived to department from PACU.  Discussed post op day goals with patient and bedside RN including ambulation, IS, diet progression, pain, and nausea control.  BSTOP education provided including BSTOP information guide, "Guide for Pain Management after your Bariatric Procedure".  Questions answered.

## 2019-07-15 NOTE — Progress Notes (Signed)
PHARMACY CONSULT FOR:  Risk Assessment for Post-Discharge VTE Following Bariatric Surgery  Post-Discharge VTE Risk Assessment: This patient's probability of 30-day post-discharge VTE is increased due to the factors marked:  x  Male   x Age >/=60 years    BMI >/=50 kg/m2  x  CHF    Dyspnea at Rest    Paraplegia   x Non-gastric-band surgery    Operation Time >/=3 hr    Return to OR     Length of Stay >/= 3 d      Hx of VTE   Hypercoagulable condition   Significant venous stasis   Predicted probability of 30-day post-discharge VTE: 3.79%  Other patient-specific factors to consider: Patient on full dose anticoagulation PTA  Recommendation for Discharge: Patient on full dose anticoagulation with rivaroxaban for atrial fibrillation PTA noted. Patient will need to resume full dose anticoagulation once safe post-op; decision of continuing rivaroxaban vs. Transitioning to warfarin +/- LMWH bridge at discretion of surgeon.  Isaiah Jones is a 69 y.o. male who underwent laparoscopic sleeve gastrectomy on 07/15/19.   Case start: 1245 Case end: 1415  No Known Allergies  Patient Measurements: Height: 6\' 2"  (188 cm) Weight: (!) 341 lb 12.8 oz (155 kg) IBW/kg (Calculated) : 82.2 Body mass index is 43.88 kg/m.  No results for input(s): WBC, HGB, HCT, PLT, APTT, CREATININE, LABCREA, CREATININE, CREAT24HRUR, MG, PHOS, ALBUMIN, PROT, ALBUMIN, AST, ALT, ALKPHOS, BILITOT, BILIDIR, IBILI in the last 72 hours. Estimated Creatinine Clearance: 126.2 mL/min (by C-G formula based on SCr of 0.87 mg/dL).    Past Medical History:  Diagnosis Date  . Arthritis, degenerative    hip,shoulders, hands, back  . 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 CHF (congestive heart failure) ([place])   . Colon polyps   . Colon polyps   . Complication of anesthesia   . Dyslipidemia   . Dysrhythmia 2017   A fib  . Family history of adverse reaction to anesthesia   . Glaucoma   .  Hyperlipidemia   . Hypertension   . OSA on CPAP    C-Pap  . Persistent atrial fibrillation ([place])    Echo 3/18: Mild LVH, EF 55-60, normal wall motion, grade 1 diastolic dysfunction, mild LAE  . PONV (postoperative nausea and vomiting)   . Rhinitis, allergic      Medications Prior to Admission  Medication Sig Dispense Refill Last Dose  . amLODipine (NORVASC) 5 MG tablet Take 1 tablet (5 mg total) by mouth daily. 90 tablet 1 07/15/2019 at 0720  . amoxicillin (AMOXIL) 500 MG capsule Take 2,000 mg by mouth See admin instructions. Take 4 capsules (2000 mg) by mouth 1 hour prior to dental procedure   07/08/2019  . azelastine (ASTELIN) 0.1 % nasal spray Place 2 sprays into both nostrils 2 (two) times daily. (Patient taking differently: Place 2 sprays into both nostrils 2 (two) times daily as needed (allergies/sneezing.). ) 30 mL 12 07/14/2019 at Unknown time  . diphenhydramine-acetaminophen (TYLENOL PM) 25-500 MG TABS tablet Take 2 tablets by mouth at bedtime.    07/13/2019  . fexofenadine (ALLEGRA) 180 MG tablet Take 180 mg by mouth daily as needed for allergies or rhinitis.   07/06/2019  . Ginkgo Biloba 60 MG TABS Take 60 mg by mouth daily.   07/11/2019  . hydrochlorothiazide (HYDRODIURIL) 12.5 MG tablet Take 1 tablet (12.5 mg total) by mouth daily. 90 tablet 1 07/12/2019  . latanoprost (XALATAN) 0.005 % ophthalmic solution Place 1  drop into both eyes at bedtime.    07/14/2019 at Unknown time  . methocarbamol (ROBAXIN) 750 MG tablet Take 750 mg by mouth 2 (two) times daily as needed (back spasms.).    07/13/2019  . metoprolol succinate (TOPROL-XL) 50 MG 24 hr tablet Take 1 tablet daily with or immediately following a meal. ** DO NOT CRUSH **    (BETA BLOCKER). (Patient taking differently: Take 50 mg by mouth daily with supper. Take 1 tablet daily with or immediately following a meal. ** DO NOT CRUSH **    (BETA BLOCKER).) 90 tablet 3 07/14/2019 at 1800  . mometasone (NASONEX) 50 MCG/ACT nasal spray Place 2  sprays into the nose at bedtime.    07/14/2019 at Unknown time  . Multiple Vitamin (MULTIVITAMIN WITH MINERALS) TABS tablet Take 1 tablet by mouth daily. Centrum Silver   07/11/2019  . Multiple Vitamins-Minerals (AIRBORNE PO) Take 1 tablet by mouth daily as needed (immune health).    07/07/2019  . OMEGA-3 KRILL OIL PO Take 1 capsule by mouth daily.    07/11/2019  . pantoprazole (PROTONIX) 40 MG tablet Take 1 tablet (40 mg total) by mouth 2 (two) times daily for 14 days. 28 tablet 0 07/08/2019  . rivaroxaban (XARELTO) 20 MG TABS tablet TAKE 1 TABLET DAILY WITH   SUPPER (Patient taking differently: Take 20 mg by mouth daily with supper. TAKE 1 TABLET DAILY WITH   SUPPE) 90 tablet 3 07/09/2019  . rosuvastatin (CRESTOR) 20 MG tablet Take 1 tablet (20 mg total) by mouth daily. 90 tablet 3 07/14/2019 at Unknown time  . sertraline (ZOLOFT) 50 MG tablet TAKE 1 TABLET DAILY (Patient taking differently: Take 50 mg by mouth daily after supper. ) 90 tablet 0 07/14/2019 at Unknown time  . thiamine (VITAMIN B-1) 100 MG tablet Take 100 mg by mouth daily.   07/11/2019  . valsartan (DIOVAN) 160 MG tablet Take 1 tablet (160 mg total) by mouth daily. 90 tablet 1 07/14/2019 at Unknown time  . acetaminophen (TYLENOL) 500 MG tablet Take 500 mg by mouth every 6 (six) hours as needed (for pain.).    07/04/2019  . amLODIPine-Valsartan-HCTZ 5-160-12.5 MG TABS Take 1 tablet by mouth daily. (Patient not taking: Reported on 07/01/2019) 90 tablet 1 Not Taking at Unknown time  . indomethacin (INDOCIN) 50 MG capsule Take 50 mg by mouth daily as needed (gout pain.).    More than a month at Unknown time  . PENNSAID 2 % SOLN Apply 1 Pump topically 4 (four) times daily as needed (aching joints).   1 More than a month at Unknown time  . tadalafil (CIALIS) 10 MG tablet Take 1 tablet (10 mg total) by mouth daily as needed for erectile dysfunction. 30 tablet 0 More than a month at Unknown time    Lenis Noon, PharmD 07/15/2019,3:09 PM

## 2019-07-15 NOTE — Progress Notes (Signed)
Pt. made aware to notify when ready for CPAP to be placed on.

## 2019-07-15 NOTE — Anesthesia Preprocedure Evaluation (Addendum)
Anesthesia Evaluation  Patient identified by MRN, date of birth, ID band Patient awake    Reviewed: Allergy & Precautions, H&P , NPO status , Patient's Chart, lab work & pertinent test results  History of Anesthesia Complications (+) PONV and Family history of anesthesia reaction  Airway Mallampati: III  TM Distance: >3 FB Neck ROM: Full    Dental no notable dental hx. (+) Missing, Dental Advisory Given   Pulmonary sleep apnea and Continuous Positive Airway Pressure Ventilation , former smoker,    Pulmonary exam normal breath sounds clear to auscultation       Cardiovascular hypertension, Pt. on medications and Pt. on home beta blockers + dysrhythmias Atrial Fibrillation  Rhythm:Regular Rate:Normal     Neuro/Psych Depression negative neurological ROS     GI/Hepatic negative GI ROS, Neg liver ROS,   Endo/Other  Morbid obesity  Renal/GU negative Renal ROS  negative genitourinary   Musculoskeletal  (+) Arthritis , Osteoarthritis,    Abdominal   Peds  Hematology negative hematology ROS (+)   Anesthesia Other Findings   Reproductive/Obstetrics negative OB ROS                             Anesthesia Physical Anesthesia Plan  ASA: III  Anesthesia Plan: General   Post-op Pain Management:    Induction: Intravenous  PONV Risk Score and Plan: 4 or greater and Ondansetron, Dexamethasone, Scopolamine patch - Pre-op and Midazolam  Airway Management Planned: Oral ETT  Additional Equipment:   Intra-op Plan:   Post-operative Plan: Extubation in OR  Informed Consent: I have reviewed the patients History and Physical, chart, labs and discussed the procedure including the risks, benefits and alternatives for the proposed anesthesia with the patient or authorized representative who has indicated his/her understanding and acceptance.     Dental advisory given  Plan Discussed with:  CRNA  Anesthesia Plan Comments:         Anesthesia Quick Evaluation

## 2019-07-15 NOTE — Transfer of Care (Signed)
Immediate Anesthesia Transfer of Care Note  Patient: Isaiah Jones  Procedure(s) Performed: LAPAROSCOPIC GASTRIC SLEEVE RESECTION, Upper Endo, ERAS Pathway (N/A )  Patient Location: PACU  Anesthesia Type:General  Level of Consciousness: drowsy, patient cooperative and responds to stimulation  Airway & Oxygen Therapy: Patient Spontanous Breathing and Patient connected to face mask oxygen  Post-op Assessment: Report given to RN and Post -op Vital signs reviewed and stable  Post vital signs: Reviewed and stable  Last Vitals:  Vitals Value Taken Time  BP 157/86 07/15/19 1424  Temp    Pulse 77 07/15/19 1426  Resp 15 07/15/19 1426  SpO2 98 % 07/15/19 1426  Vitals shown include unvalidated device data.  Last Pain:  Vitals:   07/15/19 0952  TempSrc:   PainSc: 4       Patients Stated Pain Goal: 3 ([id number])  Complications: No apparent anesthesia complications

## 2019-07-15 NOTE — Op Note (Signed)
Preoperative diagnosis: laparoscopic sleeve gastrectomy  Postoperative diagnosis: Same   Procedure: Upper endoscopy   Surgeon: Clovis Riley, M.D.  Anesthesia: Gen.   Description of procedure: The endoscopy was placed in the mouth and into the oropharynx and under endoscopic vision it was advanced to the esophagogastric junction which was identified at 42 from the teeth.  The pouch was tensely insufflated while the upper abdomen was flooded with irrigation to perform a leak test, which was negative. No bubbles were seen.  The staple line was hemostatic and the lumen was evenly tubular without undue narrowing, angulation or twisting specifically at the incisura angularis. The lumen was decompressed and the scope was withdrawn without difficulty.    Clovis Riley, M.D. General, Bariatric, & Minimally Invasive Surgery [facility]

## 2019-07-16 LAB — CBC WITH DIFFERENTIAL/PLATELET
Abs Immature Granulocytes: 0.02 10*3/uL (ref 0.00–0.07)
Basophils Absolute: 0 10*3/uL (ref 0.0–0.1)
Basophils Relative: 0 %
Eosinophils Absolute: 0 10*3/uL (ref 0.0–0.5)
Eosinophils Relative: 0 %
HCT: 45.9 % (ref 39.0–52.0)
Hemoglobin: 15.5 g/dL (ref 13.0–17.0)
Immature Granulocytes: 0 %
Lymphocytes Relative: 13 %
Lymphs Abs: 1 10*3/uL (ref 0.7–4.0)
MCH: 31.6 pg (ref 26.0–34.0)
MCHC: 33.8 g/dL (ref 30.0–36.0)
MCV: 93.7 fL (ref 80.0–100.0)
Monocytes Absolute: 0.5 10*3/uL (ref 0.1–1.0)
Monocytes Relative: 6 %
Neutro Abs: 5.9 10*3/uL (ref 1.7–7.7)
Neutrophils Relative %: 81 %
Platelets: 206 10*3/uL (ref 150–400)
RBC: 4.9 MIL/uL (ref 4.22–5.81)
RDW: 13.2 % (ref 11.5–15.5)
WBC: 7.4 10*3/uL (ref 4.0–10.5)
nRBC: 0 % (ref 0.0–0.2)

## 2019-07-16 LAB — SURGICAL PATHOLOGY

## 2019-07-16 MED ORDER — OXYCODONE HCL 5 MG PO TABS: 5.0000 mg | ORAL_TABLET | Freq: Four times a day (QID) | ORAL | 0 refills | Status: DC | PRN

## 2019-07-16 NOTE — Progress Notes (Signed)
Patient ID: Isaiah Jones, male   DOB: [birth year], 69 y.o.   MRN: [id number] [facility] Progress Note:   1 Day Post-Op  Subjective: Mental status is clear Objective: Vital signs in last 24 hours: Temp:  [97.5 F (36.4 C)-98.4 F (36.9 C)] 98.1 F (36.7 C) (03/17 0607) Pulse Rate:  [40-100] 72 (03/17 0607) Resp:  [12-20] 16 (03/17 0607) BP: (123-157)/(74-89) 147/85 (03/17 0607) SpO2:  [92 %-100 %] 96 % (03/17 0607) Weight:  [155 kg] 155 kg (03/16 0927)  Intake/Output from previous day: 03/16 0701 - 03/17 0700 In: 2241.9 [P.O.:360; I.V.:1781.9; IV Piggyback:100] Out: 2175 [Urine:2150; Blood:25] Intake/Output this shift: Total I/O In: -  Out: 600 [Urine:600]  Physical Exam: Work of breathing is normal;  Up walking OK  Lab Results:  Results for orders placed or performed during the hospital encounter of 07/15/19 (from the past 48 hour(s))  CBC     Status: None   Collection Time: 07/15/19  3:14 PM  Result Value Ref Range   WBC 8.8 4.0 - 10.5 K/uL   RBC 5.01 4.22 - 5.81 MIL/uL   Hemoglobin 16.2 13.0 - 17.0 g/dL   HCT 48.3 39.0 - 52.0 %   MCV 96.4 80.0 - 100.0 fL   MCH 32.3 26.0 - 34.0 pg   MCHC 33.5 30.0 - 36.0 g/dL   RDW 13.2 11.5 - 15.5 %   Platelets 165 150 - 400 K/uL   nRBC 0.0 0.0 - 0.2 %    Comment: Performed at [facility], [place] [address], [place], [place] [address]  Creatinine, serum     Status: None   Collection Time: 07/15/19  3:14 PM  Result Value Ref Range   Creatinine, Ser 0.83 0.61 - 1.24 mg/dL   GFR calc non Af Amer >60 >60 mL/min   GFR calc Af Amer >60 >60 mL/min    Comment: Performed at [facility], [place] [address] [place], [place] [address]  Hemoglobin and hematocrit, blood     Status: None   Collection Time: 07/15/19  5:59 PM  Result Value Ref Range   Hemoglobin 16.1 13.0 - 17.0 g/dL   HCT 48.9 39.0 - 52.0 %    Comment: Performed at [facility], [place] [address],  [place], [place] [address]  CBC WITH DIFFERENTIAL     Status: None   Collection Time: 07/16/19  4:23 AM  Result Value Ref Range   WBC 7.4 4.0 - 10.5 K/uL   RBC 4.90 4.22 - 5.81 MIL/uL   Hemoglobin 15.5 13.0 - 17.0 g/dL   HCT 45.9 39.0 - 52.0 %   MCV 93.7 80.0 - 100.0 fL   MCH 31.6 26.0 - 34.0 pg   MCHC 33.8 30.0 - 36.0 g/dL   RDW 13.2 11.5 - 15.5 %   Platelets 206 150 - 400 K/uL   nRBC 0.0 0.0 - 0.2 %   Neutrophils Relative % 81 %   Neutro Abs 5.9 1.7 - 7.7 K/uL   Lymphocytes Relative 13 %   Lymphs Abs 1.0 0.7 - 4.0 K/uL   Monocytes Relative 6 %   Monocytes Absolute 0.5 0.1 - 1.0 K/uL   Eosinophils Relative 0 %   Eosinophils Absolute 0.0 0.0 - 0.5 K/uL   Basophils Relative 0 %   Basophils Absolute 0.0 0.0 - 0.1 K/uL   Immature Granulocytes 0 %   Abs Immature Granulocytes 0.02 0.00 - 0.07 K/uL    Comment: Performed at [facility], [place] [address], [place], [place] [address]    Radiology/Results: No results found.  Anti-infectives: Anti-infectives (From admission, onward)   Start     Dose/Rate Route Frequency Ordered Stop   07/15/19 0930  cefoTEtan (CEFOTAN) 2 g in sodium chloride 0.9 % 100 mL IVPB     2 g 200 mL/hr over 30 Minutes Intravenous On call to O.R. 07/15/19 0920 07/15/19 1255      Assessment/Plan: Problem List: Patient Active Problem List   Diagnosis Date Noted  . 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 Vitamin D deficiency 06/16/2019  . Erectile dysfunction 12/05/2018  . Essential hypertension 12/05/2018  . Gout 12/05/2018  . Osteoarthritis 12/05/2018  . Seasonal allergies 12/05/2018  . 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 Morbid obesity ([place]) 12/05/2018  . Persistent atrial fibrillation ([place])   . OSA on CPAP   . Dyslipidemia     Doing well ready for discharge.   1 Day Post-Op    LOS: 1 day   Matt B. Hassell Done, MD, [facility] [phone]  beeper [phone]  07/16/2019 7:28 AM

## 2019-07-16 NOTE — Progress Notes (Signed)
Instructions were reviewed with patient. All questions were answered. Patient was transported to main entrance by wheelchair. ° °

## 2019-07-16 NOTE — Discharge Summary (Signed)
Physician Discharge Summary  Patient ID: Isaiah Jones MRN: [id number] DOB/AGE: 1950-05-12 69 y.o.  PCP: Vivi Barrack, MD  Admit date: 07/15/2019 Discharge date: 07/16/2019  Admission Diagnoses:  Morbid obesity  Discharge Diagnoses:  same  Active Problems:   S/P laparoscopic sleeve gastrectomy   Surgery:  Laparoscopic sleeve gastrectomy  Discharged Condition: improved  Hospital Course:   Had surgery on Tuesday and ready for discharge on Wednesday.    Consults: none  Significant Diagnostic Studies: none    Discharge Exam: Blood pressure (!) 153/88, pulse (!) 54, temperature 98.8 F (37.1 C), temperature source Oral, resp. rate 16, height 6\' 2"  (1.88 m), weight (!) 155 kg, SpO2 91 %. Incisions OK.  Handling pain well  Disposition: Discharge disposition: 01-Home or Self Care       Discharge Instructions    Ambulate hourly while awake   Complete by: As directed    Call MD for:  difficulty breathing, headache or visual disturbances   Complete by: As directed    Call MD for:  persistant dizziness or light-headedness   Complete by: As directed    Call MD for:  persistant nausea and vomiting   Complete by: As directed    Call MD for:  redness, tenderness, or signs of infection (pain, swelling, redness, odor or green/yellow discharge around incision site)   Complete by: As directed    Call MD for:  severe uncontrolled pain   Complete by: As directed    Call MD for:  temperature >101 F   Complete by: As directed    Diet bariatric full liquid   Complete by: As directed    Discharge instructions   Complete by: As directed    Resume Xarelto in 5 days-Monday   Incentive spirometry   Complete by: As directed    Perform hourly while awake     Allergies as of 07/16/2019   No Known Allergies     Medication List    STOP taking these medications   amoxicillin 500 MG capsule Commonly known as: AMOXIL   indomethacin 50 MG capsule Commonly known as: INDOCIN    rivaroxaban 20 MG Tabs tablet Commonly known as: Xarelto     TAKE these medications   acetaminophen 500 MG tablet Commonly known as: TYLENOL Take 500 mg by mouth every 6 (six) hours as needed (for pain.).   AIRBORNE PO Take 1 tablet by mouth daily as needed (immune health).   amLODipine 5 MG tablet Commonly known as: NORVASC Take 1 tablet (5 mg total) by mouth daily. Notes to patient: Monitor Blood Pressure Daily and keep a log for primary care physician.  You may need to make changes to your medications with rapid weight loss.     amLODIPine-Valsartan-HCTZ 5-160-12.5 MG Tabs Take 1 tablet by mouth daily. Notes to patient: Monitor Blood Pressure Daily and keep a log for primary care physician.  You may need to make changes to your medications with rapid weight loss.     azelastine 0.1 % nasal spray Commonly known as: ASTELIN Place 2 sprays into both nostrils 2 (two) times daily. What changed:   when to take this  reasons to take this   diphenhydramine-acetaminophen 25-500 MG Tabs tablet Commonly known as: TYLENOL PM Take 2 tablets by mouth at bedtime.   fexofenadine 180 MG tablet Commonly known as: ALLEGRA Take 180 mg by mouth daily as needed for allergies or rhinitis.   Ginkgo Biloba 60 MG Tabs Take 60 mg by mouth daily.  hydrochlorothiazide 12.5 MG tablet Commonly known as: HYDRODIURIL Take 1 tablet (12.5 mg total) by mouth daily. Notes to patient: Monitor Blood Pressure Daily and keep a log for primary care physician.  Monitor for symptoms of dehydration.  You may need to make changes to your medications with rapid weight loss.     latanoprost 0.005 % ophthalmic solution Commonly known as: XALATAN Place 1 drop into both eyes at bedtime.   methocarbamol 750 MG tablet Commonly known as: ROBAXIN Take 750 mg by mouth 2 (two) times daily as needed (back spasms.).   metoprolol succinate 50 MG 24 hr tablet Commonly known as: TOPROL-XL Take 1 tablet daily with or  immediately following a meal. ** DO NOT CRUSH **    (BETA BLOCKER). What changed:   how much to take  how to take this  when to take this   multivitamin with minerals Tabs tablet Take 1 tablet by mouth daily. Centrum Silver   Nasonex 50 MCG/ACT nasal spray Generic drug: mometasone Place 2 sprays into the nose at bedtime.   OMEGA-3 KRILL OIL PO Take 1 capsule by mouth daily.   oxyCODONE 5 MG immediate release tablet Commonly known as: Oxy IR/ROXICODONE Take 1 tablet (5 mg total) by mouth every 6 (six) hours as needed for severe pain.   pantoprazole 40 MG tablet Commonly known as: PROTONIX Take 1 tablet (40 mg total) by mouth 2 (two) times daily for 14 days.   Pennsaid 2 % Soln Generic drug: Diclofenac Sodium Apply 1 Pump topically 4 (four) times daily as needed (aching joints).   rosuvastatin 20 MG tablet Commonly known as: CRESTOR Take 1 tablet (20 mg total) by mouth daily.   sertraline 50 MG tablet Commonly known as: ZOLOFT TAKE 1 TABLET DAILY What changed: when to take this   tadalafil 10 MG tablet Commonly known as: Cialis Take 1 tablet (10 mg total) by mouth daily as needed for erectile dysfunction.   thiamine 100 MG tablet Commonly known as: VITAMIN B-1 Take 100 mg by mouth daily.   valsartan 160 MG tablet Commonly known as: Diovan Take 1 tablet (160 mg total) by mouth daily. Notes to patient: Monitor Blood Pressure Daily and keep a log for primary care physician.  You may need to make changes to your medications with rapid weight loss.        Follow-up Information    [facility], [place]. Go on 08/13/2019.   Specialty: General Surgery Why: at 9 am Contact information: [address] [place] [address] [phone]        Carlena Hurl, PA-C. Go on 09/02/2019.   Specialty: General Surgery Why: at 130 pm Contact information: [place] [address] [phone]           Signed: Pedro Earls 07/16/2019, 1:40 PM

## 2019-07-16 NOTE — Progress Notes (Signed)
Pt. placed CPAP on independently, tolerating well.

## 2019-07-16 NOTE — Progress Notes (Signed)
Patient alert and oriented, Post op day 1.  Provided support and encouragement.  Encouraged pulmonary toilet, ambulation and small sips of liquids. Completed 12 ounces bari clear fluid and 4 ounces protein.  All questions answered.  Will continue to monitor.

## 2019-07-16 NOTE — Progress Notes (Signed)
Protein shakes started at 0330.

## 2019-07-16 NOTE — Progress Notes (Signed)
Patient alert and oriented, pain is controlled. Patient is tolerating fluids, advanced to protein shake today, patient is tolerating well.  Reviewed Gastric sleeve discharge instructions with patient and patient is able to articulate understanding.  Provided information on BELT program, Support Group and [facility] outpatient pharmacy. All questions answered, will continue to monitor.  Total fluid intake 660 Per dehdydration protocol call back one week post op

## 2019-07-21 ENCOUNTER — Telehealth (HOSPITAL_COMMUNITY): Payer: Self-pay

## 2019-07-21 NOTE — Telephone Encounter (Signed)
Patient called to discuss post bariatric surgery follow up questions.  See below:   1.  Tell me about your pain and pain management?sore under ribs  2.  Let's talk about fluid intake.  How much total fluid are you taking in? 60 ounces  3.  How much protein have you taken in the last 2 days? 48-80 grams protein  4.  Have you had nausea?  Tell me about when have experienced nausea and what you did to help?denies nausea  5.  Has the frequency or color changed with your urine?  6.  Tell me what your incisions look like?no problems  7.  Have you been passing gas? BM? Had BM Saturday, had several bms since  8.  If a problem or question were to arise who would you call?  Do you know contact numbers for [facility], [facility], and [facility]?aware of how to contact all services  9.  How has the walking going?walking regularly  10.  How are your vitamins and calcium going?  How are you taking them?take mvi and calcium, no difficulty no questions  Starts xarelto back today, checking blood pressure daily. Follow up with PCP Wednesday

## 2019-07-23 ENCOUNTER — Ambulatory Visit: Payer: Medicare Other | Admitting: Family Medicine

## 2019-07-23 ENCOUNTER — Other Ambulatory Visit: Payer: Self-pay

## 2019-07-23 ENCOUNTER — Encounter: Payer: Self-pay | Admitting: Family Medicine

## 2019-07-23 VITALS — BP 124/68 | HR 74 | Temp 97.6°F | Ht 74.0 in | Wt 325.5 lb

## 2019-07-23 DIAGNOSIS — Z9884 Bariatric surgery status: Secondary | ICD-10-CM | POA: Diagnosis not present

## 2019-07-23 DIAGNOSIS — I10 Essential (primary) hypertension: Secondary | ICD-10-CM

## 2019-07-23 NOTE — Patient Instructions (Signed)
It was very nice to see you today!  STOP taking the hydrochlorothiazide.  Keep an eye on your blood pressure and let me know if persistently 140/90 or higher.  Your neck pain should improve over the next few weeks.  I will see you back in August.  Please come back to see me sooner if needed.  Take care, Dr Jerline Pain  Please try these tips to maintain a healthy lifestyle:   Eat at least 3 REAL meals and 1-2 snacks per day.  Aim for no more than 5 hours between eating.  If you eat breakfast, please do so within one hour of getting up.    Each meal should contain half fruits/vegetables, one quarter protein, and one quarter carbs (no bigger than a computer mouse)   Cut down on sweet beverages. This includes juice, soda, and sweet tea.     Drink at least 1 glass of water with each meal and aim for at least 8 glasses per day   Exercise at least 150 minutes every week.

## 2019-07-23 NOTE — Progress Notes (Signed)
   Isaiah Jones is a 69 y.o. male who presents today for an office visit.  Assessment/Plan:  New/Acute Problems: Throat Pain No red flags.  Likely due to recent surgery.  Continue with watchful waiting.  Chronic Problems Addressed Today: Essential hypertension At goal.  We will stop hydrochlorothiazide given good control blood pressure and to prevent him from being dehydrated status post his gastrectomy.  He will monitor blood pressures at home and let us know if persistently 140/90 or higher.  Follow-up with me in 3 to 6 months.  We will continue current dose of amlodipine 5 mg daily and valsartan 160 mg daily.  Morbid obesity (HCC) Down about 15 pounds since his surgery last week.  He is planning on being more active as the weather permits.     Subjective:  HPI:  Patient here for follow-up.  Last seen about a month ago.  Since her last visit he underwent sleeve gastrectomy about 8 days ago.  He has having some ongoing fatigue since his surgery.  Also has some mild throat pain.  He has been on liquid diet since his surgery.  He has had some improvement in his joint pain.  He would like to know if he can stop any of his medications today.       Objective:  Physical Exam: BP 124/68   Pulse 74   Temp 97.6 F (36.4 C)   Ht 6\' 2"  (1.88 m)   Wt (!) 325 lb 8 oz (147.6 kg)   SpO2 95%   BMI 41.79 kg/m   Wt Readings from Last 3 Encounters:  07/23/19 (!) 325 lb 8 oz (147.6 kg)  07/15/19 (!) 341 lb 12.8 oz (155 kg)  07/07/19 (!) 346 lb (156.9 kg)    Gen: No acute distress, resting comfortably HEENT: No deformities.  No thyromegaly. CV: Regular rate and rhythm with no murmurs appreciated Pulm: Normal work of breathing, clear to auscultation bilaterally with no crackles, wheezes, or rhonchi Neuro: Grossly normal, moves all extremities Psych: Normal affect and thought content      Isaiah Jones M. Jerline Pain, MD 07/23/2019 10:46 AM

## 2019-07-23 NOTE — Assessment & Plan Note (Addendum)
At goal.  We will stop hydrochlorothiazide given good control blood pressure and to prevent him from being dehydrated status post his gastrectomy.  He will monitor blood pressures at home and let us know if persistently 140/90 or higher.  Follow-up with me in 3 to 6 months.  We will continue current dose of amlodipine 5 mg daily and valsartan 160 mg daily.

## 2019-07-23 NOTE — Assessment & Plan Note (Addendum)
Down about 15 pounds since his surgery last week.  He is planning on being more active as the weather permits.

## 2019-07-29 ENCOUNTER — Other Ambulatory Visit: Payer: Self-pay

## 2019-07-29 ENCOUNTER — Encounter: Payer: Medicare Other | Attending: Surgery | Admitting: Skilled Nursing Facility1

## 2019-07-29 DIAGNOSIS — Z6841 Body Mass Index (BMI) 40.0 and over, adult: Secondary | ICD-10-CM | POA: Diagnosis not present

## 2019-07-29 DIAGNOSIS — G4733 Obstructive sleep apnea (adult) (pediatric): Secondary | ICD-10-CM | POA: Insufficient documentation

## 2019-07-29 DIAGNOSIS — Z7901 Long term (current) use of anticoagulants: Secondary | ICD-10-CM | POA: Insufficient documentation

## 2019-07-29 DIAGNOSIS — E7800 Pure hypercholesterolemia, unspecified: Secondary | ICD-10-CM | POA: Insufficient documentation

## 2019-07-29 DIAGNOSIS — Z79899 Other long term (current) drug therapy: Secondary | ICD-10-CM | POA: Diagnosis not present

## 2019-07-29 DIAGNOSIS — I10 Essential (primary) hypertension: Secondary | ICD-10-CM | POA: Insufficient documentation

## 2019-07-29 DIAGNOSIS — Z713 Dietary counseling and surveillance: Secondary | ICD-10-CM | POA: Insufficient documentation

## 2019-07-29 DIAGNOSIS — E669 Obesity, unspecified: Secondary | ICD-10-CM

## 2019-07-30 NOTE — Progress Notes (Signed)
2 Week Post-Operative Nutrition Class   Patient was seen on 06/25/18 for Post-Operative Nutrition education at [facility].    Surgery date: 07/15/2019 Surgery type: sleeve Start weight at [facility]: 346.7 Weight today: 324   Body Composition Scale 07/30/2019  Total Body Fat % 37.7  Visceral Fat 34  Fat-Free Mass % 62.2   Total Body Water % 43.2   Muscle-Mass lbs 61.1  Body Fat Displacement          Torso  lbs 75.8         Left Leg  lbs 15.1         Right Leg  lbs 15.1         Left Arm  lbs 7.5         Right Arm   lbs 7.5     The following the learning objectives were met by the patient during this course:  Identifies Phase 3 (Soft, High Proteins) Dietary Goals and will begin from 2 weeks post-operatively to 2 months post-operatively  Identifies appropriate sources of fluids and proteins   States protein recommendations and appropriate sources post-operatively  Identifies the need for appropriate texture modifications, mastication, and bite sizes when consuming solids  Identifies appropriate multivitamin and calcium sources post-operatively  Describes the need for physical activity post-operatively and will follow MD recommendations  States when to call healthcare provider regarding medication questions or post-operative complications   Handouts given during class include:  Phase 3A: Soft, High Protein Diet Handout   Follow-Up Plan: Patient will follow-up at [facility] in 6 weeks for 2 month post-op nutrition visit for diet advancement per MD.   

## 2019-08-04 ENCOUNTER — Telehealth: Payer: Self-pay | Admitting: Skilled Nursing Facility1

## 2019-08-04 NOTE — Telephone Encounter (Signed)
RD called pt to verify fluid intake once starting soft, solid proteins 2 week post-bariatric surgery.   Daily Fluid intake: Daily Protein intake:  Concerns/issues:   LVM 

## 2019-08-08 ENCOUNTER — Other Ambulatory Visit: Payer: Self-pay | Admitting: Family Medicine

## 2019-08-13 ENCOUNTER — Telehealth: Payer: Self-pay | Admitting: Family Medicine

## 2019-08-13 NOTE — Telephone Encounter (Signed)
Notified patient to quarantine and watch for symptoms.If he starts to have symptoms of cough,runny nose,fever,chills schedule a appt.If SOB seek EMS.

## 2019-08-13 NOTE — Telephone Encounter (Signed)
Patient is calling in saying he had a rapid covid test done yesterday, him and his wife are both positive, they were re tested and should get the other results back tomorrow. Patient Is wanting advice, they have no symptoms currently.

## 2019-08-15 ENCOUNTER — Telehealth: Payer: Self-pay | Admitting: Family Medicine

## 2019-08-15 ENCOUNTER — Telehealth (INDEPENDENT_AMBULATORY_CARE_PROVIDER_SITE_OTHER): Payer: Medicare Other | Admitting: Family Medicine

## 2019-08-15 DIAGNOSIS — U071 COVID-19: Secondary | ICD-10-CM | POA: Diagnosis not present

## 2019-08-15 MED ORDER — GUAIFENESIN-CODEINE 100-10 MG/5ML PO SOLN: 5.0000 mL | Freq: Three times a day (TID) | ORAL | 0 refills | Status: DC | PRN

## 2019-08-15 NOTE — Progress Notes (Signed)
   Isaiah Jones is a 69 y.o. male who presents today for a virtual office visit.  Assessment/Plan:  New/Acute Problems: COVID-19 No red flags.  Appears well on exam.  Will treat symptomatically with guaifenesin-codeine cough syrup.  Encouraged good oral hydration.  Can use over-the-counter meds if needed.  Discussed reasons to seek emergent care.    Subjective:  HPI:  Symptoms started a few days ago.  Wife has been sick with similar symptoms. Was tested for covid and results were positive. A lot of congestion and sputum production.  Also with rhinorrhea.       Objective/Observations  Physical Exam: Gen: NAD, resting comfortably Pulm: Normal work of breathing Neuro: Grossly normal, moves all extremities Psych: Normal affect and thought content  Virtual Visit via Video   I connected with Isaiah Jones on 08/15/19 at 12:40 PM EDT by a video enabled telemedicine application and verified that I am speaking with the correct person using two identifiers. The limitations of evaluation and management by telemedicine and the availability of in person appointments were discussed. The patient expressed understanding and agreed to proceed.   Patient location: Home Provider location: [place] participating in the virtual visit: Myself and Patient     Algis Greenhouse. Jerline Pain, MD 08/15/2019 12:56 PM

## 2019-08-15 NOTE — Telephone Encounter (Signed)
Patient scheduled.

## 2019-08-15 NOTE — Telephone Encounter (Signed)
Patient's wife states that he has the same symptoms as her and would like for something to be called in to [place], [place] if possible.

## 2019-08-15 NOTE — Telephone Encounter (Signed)
Patient stated been having SHOB no CP, productive cough (thick phlegm)Has a spirometer at home results 2500, with difficulty to used it. Last night had left rib area pain, today right area rib area pain, "I feel like a mule kick me" Wife was Dx with Covi pneumonia.

## 2019-08-15 NOTE — Telephone Encounter (Signed)
Recommend scheduling virtual visit.  Algis Greenhouse. Jerline Pain, MD 08/15/2019 12:36 PM

## 2019-08-26 ENCOUNTER — Other Ambulatory Visit: Payer: Self-pay

## 2019-08-26 ENCOUNTER — Ambulatory Visit (INDEPENDENT_AMBULATORY_CARE_PROVIDER_SITE_OTHER): Payer: Medicare Other

## 2019-08-26 DIAGNOSIS — Z0000 Encounter for general adult medical examination without abnormal findings: Secondary | ICD-10-CM | POA: Diagnosis not present

## 2019-08-26 NOTE — Progress Notes (Signed)
This visit is being conducted via phone call due to the COVID-19 pandemic. This patient has given me verbal consent via phone to conduct this visit, patient states they are participating from their home address. Some vital signs may be absent or patient reported.   Patient identification: identified by name, DOB, and current address.  Location provider: [facility], Office Persons participating in the virtual visit: Denman George LPN, patient, and Dr. Dimas Chyle   Subjective:   Isaiah Jones is a 69 y.o. male who presents for an Initial Medicare Annual Wellness Visit.  Review of Systems   Cardiac Risk Factors include: advanced age (>63men, >67 women);male gender;hypertension;obesity (BMI >30kg/m2)    Objective:    There were no vitals filed for this visit. There is no height or weight on file to calculate BMI.  Advanced Directives 08/26/2019 07/15/2019 07/07/2019 10/03/2018 08/10/2016  Does Patient Have a Medical Advance Directive? Yes Yes Yes No No  Type of Advance Directive Living will;Healthcare Power of [place];Living will [place];Living will - -  Does patient want to make changes to medical advance directive? No - Patient declined No - Patient declined - - -  Copy of [place] in Chart? No - copy requested - - - -  Would patient like information on creating a medical advance directive? - - - No - Patient declined Yes (MAU/Ambulatory/Procedural Areas - Information given)    Current Medications (verified) Outpatient Encounter Medications as of 08/26/2019  Medication Sig  . acetaminophen (TYLENOL) 500 MG tablet Take 500 mg by mouth every 6 (six) hours as needed (for pain.).   Marland Kitchen amLODipine (NORVASC) 5 MG tablet Take 1 tablet (5 mg total) by mouth daily.  Marland Kitchen azelastine (ASTELIN) 0.1 % nasal spray Place 2 sprays into both nostrils 2 (two) times daily. (Patient taking differently: Place 2 sprays into both nostrils 2  (two) times daily as needed (allergies/sneezing.). )  . diphenhydramine-acetaminophen (TYLENOL PM) 25-500 MG TABS tablet Take 2 tablets by mouth at bedtime.   . fexofenadine (ALLEGRA) 180 MG tablet Take 180 mg by mouth daily as needed for allergies or rhinitis.  . Ginkgo Biloba 60 MG TABS Take 60 mg by mouth daily.  Marland Kitchen guaiFENesin-codeine 100-10 MG/5ML syrup Take 5 mLs by mouth 3 (three) times daily as needed for cough.  . latanoprost (XALATAN) 0.005 % ophthalmic solution Place 1 drop into both eyes at bedtime.   . methocarbamol (ROBAXIN) 750 MG tablet Take 750 mg by mouth 2 (two) times daily as needed (back spasms.).   Marland Kitchen metoprolol succinate (TOPROL-XL) 50 MG 24 hr tablet Take 1 tablet daily with or immediately following a meal. ** DO NOT CRUSH **    (BETA BLOCKER). (Patient taking differently: Take 50 mg by mouth daily with supper. Take 1 tablet daily with or immediately following a meal. ** DO NOT CRUSH **    (BETA BLOCKER).)  . mometasone (NASONEX) 50 MCG/ACT nasal spray Place 2 sprays into the nose at bedtime.   . Multiple Vitamin (MULTIVITAMIN WITH MINERALS) TABS tablet Take 1 tablet by mouth daily. Centrum Silver  . Multiple Vitamins-Minerals (AIRBORNE PO) Take 1 tablet by mouth daily as needed (immune health).   . OMEGA-3 KRILL OIL PO Take 1 capsule by mouth daily.   Marland Kitchen oxyCODONE (OXY IR/ROXICODONE) 5 MG immediate release tablet Take 1 tablet (5 mg total) by mouth every 6 (six) hours as needed for severe pain.  . pantoprazole (PROTONIX) 40 MG  tablet Take 1 tablet (40 mg total) by mouth 2 (two) times daily for 14 days.  Marland Kitchen PENNSAID 2 % SOLN Apply 1 Pump topically 4 (four) times daily as needed (aching joints).   . rosuvastatin (CRESTOR) 20 MG tablet Take 1 tablet (20 mg total) by mouth daily.  . sertraline (ZOLOFT) 50 MG tablet TAKE 1 TABLET DAILY  . tadalafil (CIALIS) 10 MG tablet Take 1 tablet (10 mg total) by mouth daily as needed for erectile dysfunction.  . thiamine (VITAMIN B-1) 100 MG  tablet Take 100 mg by mouth daily.  . valsartan (DIOVAN) 160 MG tablet Take 1 tablet (160 mg total) by mouth daily.   No facility-administered encounter medications on file as of 08/26/2019.    Allergies (verified) Patient has no known allergies.   History: Past Medical History:  Diagnosis Date  . Arthritis, degenerative    hip,shoulders, hands, back  . 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 CHF (congestive heart failure) ([place])   . Colon polyps   . Colon polyps   . Complication of anesthesia   . Dyslipidemia   . Dysrhythmia 2017   A fib  . Family history of adverse reaction to anesthesia   . Glaucoma   . Hyperlipidemia   . Hypertension   . OSA on CPAP    C-Pap  . Persistent atrial fibrillation ([place])    Echo 3/18: Mild LVH, EF 55-60, normal wall motion, grade 1 diastolic dysfunction, mild LAE  . PONV (postoperative nausea and vomiting)   . Rhinitis, allergic    Past Surgical History:  Procedure Laterality Date  . arthroscopic knee surgery Bilateral 2003  . CARDIOVERSION N/A 08/10/2016   Procedure: CARDIOVERSION;  Surgeon: Pixie Casino, MD;  Location: [facility] ENDOSCOPY;  Service: Cardiovascular;  Laterality: N/A;  . LAPAROSCOPIC GASTRIC SLEEVE RESECTION N/A 07/15/2019   Procedure: LAPAROSCOPIC GASTRIC SLEEVE RESECTION, Upper Endo, ERAS Pathway;  Surgeon: [name], MD;  Location: [facility] ORS;  Service: General;  Laterality: N/A;  . REPLACEMENT TOTAL KNEE Bilateral 2008  . TONSILLECTOMY    . VASECTOMY     Family History  Problem Relation Age of Onset  . Alzheimer's disease Mother   . Heart attack Mother   . Hyperlipidemia Mother   . Hypertension Mother   . Arthritis Mother   . Heart attack Father 25  . Arthritis Father   . Hyperlipidemia Father   . Hypertension Father   . Hypertension Brother   . Hyperlipidemia Brother   . Other Brother        Worth  . Atrial fibrillation Neg Hx    Social History   Socioeconomic History    . Marital status: Married    Spouse name: Not on file  . Number of children: Not on file  . Years of education: Not on file  . Highest education level: Not on file  Occupational History  . Occupation: Retired   Tobacco Use  . Smoking status: Former Smoker    Types: Cigarettes    Quit date: 07/04/2001    Years since quitting: 18.1  . Smokeless tobacco: Never Used  Substance and Sexual Activity  . Alcohol use: Yes    Comment: OCCASIONALLY  . Drug use: No  . Sexual activity: Yes    Comment: MARRIED  Other Topics Concern  . Not on file  Social History Narrative   L[place] in [place], [place]   Married   2 kids   Retired - Prior worked at  [employer]   Social Determinants of Health   Financial Resource Strain:   . Difficulty of Paying Living Expenses:   Food Insecurity:   . Worried About Charity fundraiser in the Last Year:   . Arboriculturist in the Last Year:   Transportation Needs:   . Film/video editor (Medical):   Marland Kitchen Lack of Transportation (Non-Medical):   Physical Activity:   . Days of Exercise per Week:   . Minutes of Exercise per Session:   Stress:   . Feeling of Stress :   Social Connections:   . Frequency of Communication with Friends and Family:   . Frequency of Social Gatherings with Friends and Family:   . Attends Religious Services:   . Active Member of Clubs or Organizations:   . Attends Archivist Meetings:   Marland Kitchen Marital Status:    Tobacco Counseling Counseling given: Not Answered   Clinical Intake:  Pre-visit preparation completed: Yes  Pain : No/denies pain  Diabetes: No  How often do you need to have someone help you when you read instructions, pamphlets, or other written materials from your doctor or pharmacy?: 1 - Never  Interpreter Needed?: No  Information entered by :: Denman George LPN  Activities of Daily Living In your present state of health, do you have any difficulty performing the following activities: 08/26/2019 07/15/2019   Hearing? N N  Vision? N N  Difficulty concentrating or making decisions? N N  Walking or climbing stairs? N N  Dressing or bathing? N N  Doing errands, shopping? N N  Preparing Food and eating ? N -  Using the Toilet? N -  In the past six months, have you accidently leaked urine? N -  Do you have problems with loss of bowel control? N -  Managing your Medications? N -  Managing your Finances? N -  Housekeeping or managing your Housekeeping? N -  Some recent data might be hidden     Immunizations and Health Maintenance Immunization History  Administered Date(s) Administered  . Influenza-Unspecified 04/02/2018   Health Maintenance Due  Topic Date Due  . COVID-19 Vaccine (1) Never done    Patient Care Team: Vivi Barrack, MD as PCP - General (Family Medicine) Jettie Booze, MD as Consulting Physician (Cardiology) Lucas Mallow, MD as Consulting Physician (Urology) Trenton Founds, MD as Referring Physician (Gastroenterology) Sherlynn Carbon, MD as Referring Physician (Pulmonary Disease) Melrose Nakayama, MD as Consulting Physician (Orthopedic Surgery) Saddie Benders as Consulting Physician (Dentistry) Normajean Glasgow, MD as Attending Physician (Physical Medicine and Rehabilitation)  Indicate any recent Medical Services you may have received from other than [facility] providers in the past year (date may be approximate).    Assessment:   This is a routine wellness examination for [place].  Hearing/Vision screen No exam data present  Dietary issues and exercise activities discussed: Current Exercise Habits: Home exercise routine, Type of exercise: walking;Other - see comments(yardwork activities), Time (Minutes): 30, Frequency (Times/Week): 5, Weekly Exercise (Minutes/Week): 150, Intensity: Moderate  Goals    . Exercise 150 min/wk Moderate Activity     Would like to restart regular exercise     . Weight (lb) < 300 lb (136.1 kg) (pt-stated)     Would like to reach goal  weight in order to have hip surgery       Depression Screen PHQ 2/9 Scores 08/26/2019 12/05/2018 10/03/2018  PHQ - 2 Score 0 0 0  PHQ- 9 Score -  6 -    Fall Risk Fall Risk  08/26/2019 10/03/2018  Falls in the past year? 0 0  Number falls in past yr: 0 -  Injury with Fall? 0 -  Risk for fall due to : Orthopedic patient -  Follow up Falls evaluation completed;Education provided;Falls prevention discussed -    Is the patient's home free of loose throw rugs in walkways, pet beds, electrical cords, etc?   yes      Grab bars in the bathroom? yes      Handrails on the stairs?   yes      Adequate lighting?   yes  Cognitive Function: no cognitive concerns at this time      6CIT Screen 08/26/2019  What Year? 0 points  What month? 0 points  What time? 0 points  Count back from 20 0 points  Months in reverse 0 points  Repeat phrase 0 points  Total Score 0    Screening Tests Health Maintenance  Topic Date Due  . COVID-19 Vaccine (1) Never done  . COLONOSCOPY  12/05/2019 (Originally 09/06/1999)  . TETANUS/TDAP  12/05/2019 (Originally 09/05/1968)  . PNA vac Low Risk Adult (1 of 2 - PCV13) 12/05/2019 (Originally 09/06/2014)  . INFLUENZA VACCINE  11/30/2019  . Hepatitis C Screening  Completed    Qualifies for Shingles Vaccine? Discussed and patient will check with pharmacy for coverage.  Patient education handout provided   Cancer Screenings: Lung: Low Dose CT Chest recommended if Age 65-80 years, 30 pack-year currently smoking OR have quit w/in 15years. Patient does not qualify. Colorectal: patient would like to defer at this time due to recent surgery;  Cologuard information provided      Plan:  I have personally reviewed and addressed the Medicare Annual Wellness questionnaire and have noted the following in the patient's chart:  A. Medical and social history B. Use of alcohol, tobacco or illicit drugs  C. Current medications and supplements D. Functional ability and status E.    Nutritional status F.  Physical activity G. Advance directives H. List of other physicians I.  Hospitalizations, surgeries, and ER visits in previous 12 months J.  [place] such as hearing and vision if needed, cognitive and depression L. Referrals, records requested, and appointments- none   In addition, I have reviewed and discussed with patient certain preventive protocols, quality metrics, and best practice recommendations. A written personalized care plan for preventive services as well as general preventive health recommendations were provided to patient.   Signed,  Denman George, LPN  Nurse Health Advisor   Nurse Notes: no additional

## 2019-08-26 NOTE — Patient Instructions (Signed)
Isaiah Jones , Thank you for taking time to come for your Medicare Wellness Visit. I appreciate your ongoing commitment to your health goals. Please review the following plan we discussed and let me know if I can assist you in the future.   Screening recommendations/referrals: Colorectal Screening: recommended; consider the Cologuard (information included)   Vision and Dental Exams: Recommended annual ophthalmology exams for early detection of glaucoma and other disorders of the eye Recommended annual dental exams for proper oral hygiene  Vaccinations: see handouts  Influenza vaccine: recommended each fall  Pneumococcal vaccine: recommended  Tdap vaccine: recommended every 10 years; Please call your insurance company to determine your out of pocket expense. You also receive this vaccine at your local pharmacy or Health Dept. Shingles vaccine:  You may receive this vaccine at your local pharmacy. (see handout)   Advanced directives: Please bring a copy of your POA (Power of Attorney) and/or Living Will to your next appointment.  Goals:Recommend to drink at least 6-8 8oz glasses of water per day and consume a balanced diet rich in fresh fruits and vegetables.   Next appointment: Please schedule your Annual Wellness Visit with your Nurse Health Advisor in one year.  Preventive Care 70 Years and Older, Male Preventive care refers to lifestyle choices and visits with your health care provider that can promote health and wellness. What does preventive care include?  A yearly physical exam. This is also called an annual well check.  Dental exams once or twice a year.  Routine eye exams. Ask your health care provider how often you should have your eyes checked.  Personal lifestyle choices, including:  Daily care of your teeth and gums.  Regular physical activity.  Eating a healthy diet.  Avoiding tobacco and drug use.  Limiting alcohol use.  Practicing safe sex.  Taking low doses  of aspirin every day if recommended by your health care provider..  Taking vitamin and mineral supplements as recommended by your health care provider. What happens during an annual well check? The services and screenings done by your health care provider during your annual well check will depend on your age, overall health, lifestyle risk factors, and family history of disease. Counseling  Your health care provider may ask you questions about your:  Alcohol use.  Tobacco use.  Drug use.  Emotional well-being.  Home and relationship well-being.  Sexual activity.  Eating habits.  History of falls.  Memory and ability to understand (cognition).  Work and work Statistician. Screening  You may have the following tests or measurements:  Height, weight, and BMI.  Blood pressure.  Lipid and cholesterol levels. These may be checked every 5 years, or more frequently if you are over 70 years old.  Skin check.  Lung cancer screening. You may have this screening every year starting at age 70 if you have a 30-pack-year history of smoking and currently smoke or have quit within the past 15 years.  Fecal occult blood test (FOBT) of the stool. You may have this test every year starting at age 70.  Flexible sigmoidoscopy or colonoscopy. You may have a sigmoidoscopy every 5 years or a colonoscopy every 10 years starting at age 70.  Prostate cancer screening. Recommendations will vary depending on your family history and other risks.  Hepatitis C blood test.  Hepatitis B blood test.  Sexually transmitted disease (STD) testing.  Diabetes screening. This is done by checking your blood sugar (glucose) after you have not eaten for a while (  fasting). You may have this done every 1-3 years.  Abdominal aortic aneurysm (AAA) screening. You may need this if you are a current or former smoker.  Osteoporosis. You may be screened starting at age 70 if you are at high risk. Talk with your  health care provider about your test results, treatment options, and if necessary, the need for more tests. Vaccines  Your health care provider may recommend certain vaccines, such as:  Influenza vaccine. This is recommended every year.  Tetanus, diphtheria, and acellular pertussis (Tdap, Td) vaccine. You may need a Td booster every 10 years.  Zoster vaccine. You may need this after age 40.  Pneumococcal 13-valent conjugate (PCV13) vaccine. One dose is recommended after age 70.  Pneumococcal polysaccharide (PPSV23) vaccine. One dose is recommended after age 70. Talk to your health care provider about which screenings and vaccines you need and how often you need them. This information is not intended to replace advice given to you by your health care provider. Make sure you discuss any questions you have with your health care provider. Document Released: 05/14/2015 Document Revised: 01/05/2016 Document Reviewed: 02/16/2015 Elsevier Interactive Patient Education  2017 [place] Prevention in the Home Falls can cause injuries. They can happen to people of all ages. There are many things you can do to make your home safe and to help prevent falls. What can I do on the outside of my home?  Regularly fix the edges of walkways and driveways and fix any cracks.  Remove anything that might make you trip as you walk through a door, such as a raised step or threshold.  Trim any bushes or trees on the path to your home.  Use bright outdoor lighting.  Clear any walking paths of anything that might make someone trip, such as rocks or tools.  Regularly check to see if handrails are loose or broken. Make sure that both sides of any steps have handrails.  Any raised decks and porches should have guardrails on the edges.  Have any leaves, snow, or ice cleared regularly.  Use sand or salt on walking paths during winter.  Clean up any spills in your garage right away. This includes oil  or grease spills. What can I do in the bathroom?  Use night lights.  Install grab bars by the toilet and in the tub and shower. Do not use towel bars as grab bars.  Use non-skid mats or decals in the tub or shower.  If you need to sit down in the shower, use a plastic, non-slip stool.  Keep the floor dry. Clean up any water that spills on the floor as soon as it happens.  Remove soap buildup in the tub or shower regularly.  Attach bath mats securely with double-sided non-slip rug tape.  Do not have throw rugs and other things on the floor that can make you trip. What can I do in the bedroom?  Use night lights.  Make sure that you have a light by your bed that is easy to reach.  Do not use any sheets or blankets that are too big for your bed. They should not hang down onto the floor.  Have a firm chair that has side arms. You can use this for support while you get dressed.  Do not have throw rugs and other things on the floor that can make you trip. What can I do in the kitchen?  Clean up any spills right away.  Avoid  walking on wet floors.  Keep items that you use a lot in easy-to-reach places.  If you need to reach something above you, use a strong step stool that has a grab bar.  Keep electrical cords out of the way.  Do not use floor polish or wax that makes floors slippery. If you must use wax, use non-skid floor wax.  Do not have throw rugs and other things on the floor that can make you trip. What can I do with my stairs?  Do not leave any items on the stairs.  Make sure that there are handrails on both sides of the stairs and use them. Fix handrails that are broken or loose. Make sure that handrails are as long as the stairways.  Check any carpeting to make sure that it is firmly attached to the stairs. Fix any carpet that is loose or worn.  Avoid having throw rugs at the top or bottom of the stairs. If you do have throw rugs, attach them to the floor with  carpet tape.  Make sure that you have a light switch at the top of the stairs and the bottom of the stairs. If you do not have them, ask someone to add them for you. What else can I do to help prevent falls?  Wear shoes that:  Do not have high heels.  Have rubber bottoms.  Are comfortable and fit you well.  Are closed at the toe. Do not wear sandals.  If you use a stepladder:  Make sure that it is fully opened. Do not climb a closed stepladder.  Make sure that both sides of the stepladder are locked into place.  Ask someone to hold it for you, if possible.  Clearly mark and make sure that you can see:  Any grab bars or handrails.  First and last steps.  Where the edge of each step is.  Use tools that help you move around (mobility aids) if they are needed. These include:  Canes.  Walkers.  Scooters.  Crutches.  Turn on the lights when you go into a dark area. Replace any light bulbs as soon as they burn out.  Set up your furniture so you have a clear path. Avoid moving your furniture around.  If any of your floors are uneven, fix them.  If there are any pets around you, be aware of where they are.  Review your medicines with your doctor. Some medicines can make you feel dizzy. This can increase your chance of falling. Ask your doctor what other things that you can do to help prevent falls. This information is not intended to replace advice given to you by your health care provider. Make sure you discuss any questions you have with your health care provider. Document Released: 02/11/2009 Document Revised: 09/23/2015 Document Reviewed: 05/22/2014 Elsevier Interactive Patient Education  2017 [employer].

## 2019-09-05 ENCOUNTER — Telehealth: Payer: Self-pay | Admitting: Family Medicine

## 2019-09-05 NOTE — Telephone Encounter (Signed)
Patient calling in stating that we should be getting a Clearance letter sent to Dr Jerline Pain from Dr Rhona Raider [facility]) getting clearance for his hip surgery. Patient ask if we could get that back to them asap. Please advise Patient when it comes. jk

## 2019-09-09 ENCOUNTER — Encounter: Payer: Medicare Other | Attending: Surgery | Admitting: Skilled Nursing Facility1

## 2019-09-09 ENCOUNTER — Other Ambulatory Visit: Payer: Self-pay

## 2019-09-09 DIAGNOSIS — G4733 Obstructive sleep apnea (adult) (pediatric): Secondary | ICD-10-CM | POA: Insufficient documentation

## 2019-09-09 DIAGNOSIS — Z713 Dietary counseling and surveillance: Secondary | ICD-10-CM | POA: Insufficient documentation

## 2019-09-09 DIAGNOSIS — Z6841 Body Mass Index (BMI) 40.0 and over, adult: Secondary | ICD-10-CM | POA: Insufficient documentation

## 2019-09-09 DIAGNOSIS — Z7901 Long term (current) use of anticoagulants: Secondary | ICD-10-CM | POA: Diagnosis not present

## 2019-09-09 DIAGNOSIS — E7800 Pure hypercholesterolemia, unspecified: Secondary | ICD-10-CM | POA: Insufficient documentation

## 2019-09-09 DIAGNOSIS — I10 Essential (primary) hypertension: Secondary | ICD-10-CM | POA: Insufficient documentation

## 2019-09-09 DIAGNOSIS — Z79899 Other long term (current) drug therapy: Secondary | ICD-10-CM | POA: Insufficient documentation

## 2019-09-09 NOTE — Telephone Encounter (Signed)
No clearance form received at this time

## 2019-09-09 NOTE — Progress Notes (Signed)
Bariatric Nutrition Follow-Up Visit Medical Nutrition Therapy   2 Months Post-Operative sleeve  Surgery Surgery Date: 07/15/2019  NUTRITION ASSESSMENT  Anthropometrics  Start weight at [facility]: 346.7 lbs (date: 10/03/2018) Height: 74 in BMI: 38.68 kg/m2    Printer was not working Body Composition Scale Date  Weight  lbs 301.3  Total Body Fat  % 35.7     Visceral Fat   Fat-Free Mass  %      Total Body Water  % 45.2     Muscle-Mass  lbs   BMI 38.68  Body Fat Displacement ---        Torso  lbs         Left Leg  lbs         Right Leg  lbs         Left Arm  lbs         Right Arm  lbs    Clinical  Medical hx: obesity, HTN, hypercholesterolemia, sleep apnea, COPD Medications: rosuvastatin, Xarelto, sertraline, amlodipine valsartan, mometasone furoate, metoprolol succinate ER, Tylenol, latanoprost, methocarbamol, Allegra, airborne Labs:    Lifestyle & Dietary Hx  Pt states he just had covid19 about 2 weeks ago. Pt states he was able to eat and drink throughout. Pt states he makes his own tomato juice.    Estimated daily fluid intake: 48 oz Estimated daily protein intake: 80+ g Supplements: bari adv and calcium Current average weekly physical activity: limited due to hip pain and needing a surgery  24-Hr Dietary Recall First Meal: protein shake Snack: 11:30-12 cheese and egg Second Meal: yogurt Snack:  Third Meal: pork or hamburger Snack:  Beverages: water, water with flavoring   Post-Op Goals/ Signs/ Symptoms Using straws: no Drinking while eating: no Chewing/swallowing difficulties: no Changes in vision: no Changes to mood/headaches: no Hair loss/changes to skin/nails: no Difficulty focusing/concentrating: no Sweating: no Dizziness/lightheadedness: no Palpitations: no  Carbonated/caffeinated beverages: no N/V/D/C/Gas: no Abdominal pain: no Dumping syndrome: no    NUTRITION DIAGNOSIS  Overweight/obesity ([place]at-3.3) related to past poor dietary habits and  physical inactivity as evidenced by completed bariatric surgery and following dietary guidelines for continued weight loss and healthy nutrition status.     NUTRITION INTERVENTION Nutrition counseling (C-1) and education (E-2) to facilitate bariatric surgery goals, including: . Diet advancement to the next phase (phase 4) now including non starchy vegetables  . The importance of consuming adequate calories as well as certain nutrients daily due to the body's need for essential vitamins, minerals, and fats . The importance of daily physical activity and to reach a goal of at least 150 minutes of moderate to vigorous physical activity weekly (or as directed by their physician) due to benefits such as increased musculature and improved lab values . The importance of intuitive eating specifically learning hunger-satiety cues and understanding the importance of learning a new body  Goals: -Continue to aim for a minimum of 64 fluid ounces 7 days a week with at least 30 ounces being plain water -Eat non-starchy vegetables 2 times a day 7 days a week -Start out with soft cooked vegetables today and tomorrow; if tolerated begin to eat raw vegetables or cooked including salads -Eat your 3 ounces of protein first then start in on your non-starchy vegetables; once you understand how much of your meal leads to satisfaction and not full while still eating 3 ounces of protein and non-starchy vegetables you can eat them in any order  -Continue to aim for 30 minutes of activity  at least 5 times a week -Do NOT cook with/add to your food: alfredo sauce, cheese sauce, barbeque sauce, ketchup, fat back, butter, bacon grease, grease, Crisco, OR SUGAR   Handouts Provided Include   Phase 4  Learning Style & Readiness for Change Teaching method utilized: Visual & Auditory  Demonstrated degree of understanding via: Teach Back  Barriers to learning/adherence to lifestyle change: none identified   RD's Notes for  Next Visit . Assess adherence to pt chosen goals   MONITORING & EVALUATION Dietary intake, weekly physical activity, body weight  Next Steps Patient is to follow-up in 3 months

## 2019-09-19 ENCOUNTER — Other Ambulatory Visit: Payer: Self-pay | Admitting: Orthopaedic Surgery

## 2019-09-22 ENCOUNTER — Telehealth: Payer: Self-pay | Admitting: Interventional Cardiology

## 2019-09-22 NOTE — Telephone Encounter (Signed)
   [facility] Pre-operative Risk Assessment    HEARTCARE STAFF: - Please ensure there is not already an duplicate clearance open for this procedure. - Under Visit Info/Reason for Call, type in Other and utilize the format Clearance MM/DD/YY or Clearance TBD. Do not use dashes or single digits. - If request is for dental extraction, please clarify the # of teeth to be extracted.  Request for surgical clearance:  1. What type of surgery is being performed? Right Hip Arthroplasty   2. When is this surgery scheduled? 10/21/19  3. What type of clearance is required (medical clearance vs. Pharmacy clearance to hold med vs. Both)? Both   4. Are there any medications that need to be held prior to surgery and how long? TBD by Dr. Irish Lack   5. Practice name and name of physician performing surgery?  [facility], Dr. Melrose Nakayama  6. What is the office phone number? [phone]   7.   What is the office fax number? [phone]  8.   Anesthesia type (None, local, MAC, general) ? Spinal    Isaiah Jones 09/22/2019, 2:53 PM  _________________________________________________________________   (provider comments below)

## 2019-09-23 ENCOUNTER — Telehealth: Payer: Self-pay | Admitting: Interventional Cardiology

## 2019-09-23 NOTE — Telephone Encounter (Signed)
pre-op appt Received: Today Message Contents  Waylan Rocher, [place] Scheduling  Please call pt and schedule appt for an APP for per-op cardiac clearance   Thank you!  Sharyn Lull

## 2019-09-23 NOTE — Telephone Encounter (Signed)
Primary Cardiologist:Dr. Irish Lack   Chart reviewed as part of pre-operative protocol coverage. Because of Isaiah Jones past medical history and time since last visit, Isaiah Jones will require a follow-up visit in order to better assess preoperative cardiovascular risk.  Pre-op covering staff: - Please schedule appointment and call patient to inform them. - Please contact requesting surgeon's office via preferred method (i.e, phone, fax) to inform them of need for appointment prior to surgery.  If applicable, this message will also be routed to pharmacy pool and/or primary cardiologist for input on holding anticoagulant/antiplatelet agent as requested below so that this information is available at time of patient's appointment.   Jory Sims, NP  09/23/2019, 11:16 AM

## 2019-09-23 NOTE — Telephone Encounter (Signed)
LMTCB to schedule pre op clearance appt.

## 2019-09-23 NOTE — Telephone Encounter (Signed)
Message sent to [place] scheduling to call pt

## 2019-09-24 NOTE — Telephone Encounter (Signed)
Left message to call back  

## 2019-09-24 NOTE — Telephone Encounter (Signed)
Patient is returning call to schedule appointment for pre op clearance. However, his procedure is scheduled for 10/21/19 and Dr. Irish Lack / APP's do not have any availability prior to the date the procedure is scheduled for. I made patient aware of this and he verbalized understanding. Patient states he is requesting to speak with Dr. Hassell Done nurse so he may be seen sooner. He is  requesting to be seen on 10/02/19 specifically. Please call.

## 2019-09-25 NOTE — Telephone Encounter (Signed)
Appt scheduled 10/02/2019 @ 10:45 AM Leanor Kail, PA

## 2019-09-30 NOTE — Telephone Encounter (Signed)
Touched base with patient. He is already scheduled with Robbie Lis, PA on 6/3 for pre-op clearance. He will keep this appointment.

## 2019-10-02 ENCOUNTER — Encounter: Payer: Self-pay | Admitting: Family Medicine

## 2019-10-02 ENCOUNTER — Other Ambulatory Visit: Payer: Self-pay

## 2019-10-02 ENCOUNTER — Ambulatory Visit (INDEPENDENT_AMBULATORY_CARE_PROVIDER_SITE_OTHER): Payer: Medicare Other | Admitting: Family Medicine

## 2019-10-02 ENCOUNTER — Encounter: Payer: Self-pay | Admitting: Physician Assistant

## 2019-10-02 ENCOUNTER — Ambulatory Visit: Payer: Medicare Other | Admitting: Physician Assistant

## 2019-10-02 VITALS — BP 128/80 | HR 66 | Ht 74.0 in | Wt 299.8 lb

## 2019-10-02 VITALS — BP 130/90 | HR 68 | Temp 97.3°F | Ht 74.0 in | Wt 299.5 lb

## 2019-10-02 DIAGNOSIS — M1990 Unspecified osteoarthritis, unspecified site: Secondary | ICD-10-CM

## 2019-10-02 DIAGNOSIS — I119 Hypertensive heart disease without heart failure: Secondary | ICD-10-CM

## 2019-10-02 DIAGNOSIS — Z01818 Encounter for other preprocedural examination: Secondary | ICD-10-CM

## 2019-10-02 DIAGNOSIS — I4819 Other persistent atrial fibrillation: Secondary | ICD-10-CM

## 2019-10-02 DIAGNOSIS — Z9989 Dependence on other enabling machines and devices: Secondary | ICD-10-CM

## 2019-10-02 DIAGNOSIS — I10 Essential (primary) hypertension: Secondary | ICD-10-CM | POA: Diagnosis not present

## 2019-10-02 DIAGNOSIS — G4733 Obstructive sleep apnea (adult) (pediatric): Secondary | ICD-10-CM

## 2019-10-02 LAB — POCT GLYCOSYLATED HEMOGLOBIN (HGB A1C): Hemoglobin A1C: 5.1 % (ref 4.0–5.6)

## 2019-10-02 NOTE — Assessment & Plan Note (Signed)
Stable.  Continue management per cardiology. 

## 2019-10-02 NOTE — Patient Instructions (Signed)
Medication Instructions:  Your physician recommends that you continue on your current medications as directed. Please refer to the Current Medication list given to you today.  *If you need a refill on your cardiac medications before your next appointment, please call your pharmacy*   Lab Work: None ordered  If you have labs (blood work) drawn today and your tests are completely normal, you will receive your results only by: Marland Kitchen MyChart Message (if you have MyChart) OR . A paper copy in the mail If you have any lab test that is abnormal or we need to change your treatment, we will call you to review the results.   Testing/Procedures: None ordered   Follow-Up: At [facility], you and your health needs are our priority.  As part of our continuing mission to provide you with exceptional heart care, we have created designated Provider Care Teams.  These Care Teams include your primary Cardiologist (physician) and Advanced Practice Providers (APPs -  Physician Assistants and Nurse Practitioners) who all work together to provide you with the care you need, when you need it.  We recommend signing up for the patient portal called "MyChart".  Sign up information is provided on this After Visit Summary.  MyChart is used to connect with patients for Virtual Visits (Telemedicine).  Patients are able to view lab/test results, encounter notes, upcoming appointments, etc.  Non-urgent messages can be sent to your provider as well.   To learn more about what you can do with MyChart, go to [email or web address].    Your next appointment:   6 month(s)  The format for your next appointment:   In Person  Provider:   You may see Dr. Irish Lack or one of the following Advanced Practice Providers on your designated Care Team:    Melina Copa, PA-C  Ermalinda Barrios, PA-C    Other Instructions

## 2019-10-02 NOTE — Progress Notes (Signed)
Cardiology Office Note    Date:  10/02/2019   ID:  Isaiah Jones, DOB May 24, 1950, MRN [id number]  PCP:  Vivi Barrack, MD  Cardiologist: Dr. Irish Lack  Chief Complaint: Surgical clearance for right hip arthroplasty  History of Present Illness:   Isaiah Jones is a 69 y.o. male with history of obstructive sleep apnea on CPAP, hypertension, hyperlipidemia, morbid obesity, persistent atrial fibrillation on Xarelto for anticoagulation and failed cardioversion presents for surgical clearance.  Echocardiogram March 2018 showed LV function of 55 to 123456 diastolic dysfunction and mildly dilated left atrium. Patient was last seen by Dr. Irish Lack October 2020.  Here today for surgical clearance. Had gastric sleeve surgery 06/2019 and did well and lost weight.  Patient has right hip pain and requiring surgery.  He rides bicycle every other day for 30 minutes without chest pain or shortness of breath.  If raining, he rides stationary bicycle without any issue.  He denies chest pain, shortness of breath, orthopnea, PND, syncope, lower extremity edema or melena.  Compliant with his medications.  Past Medical History:  Diagnosis Date  . Arthritis, degenerative    hip,shoulders, hands, back  . 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 CHF (congestive heart failure) ([place])   . Colon polyps   . Colon polyps   . Complication of anesthesia   . Dyslipidemia   . Dysrhythmia 2017   A fib  . Family history of adverse reaction to anesthesia   . Glaucoma   . Hyperlipidemia   . Hypertension   . OSA on CPAP    C-Pap  . Persistent atrial fibrillation ([place])    Echo 3/18: Mild LVH, EF 55-60, normal wall motion, grade 1 diastolic dysfunction, mild LAE  . PONV (postoperative nausea and vomiting)   . Rhinitis, allergic     Past Surgical History:  Procedure Laterality Date  . arthroscopic knee surgery Bilateral 2003  . CARDIOVERSION N/A 08/10/2016   Procedure:  CARDIOVERSION;  Surgeon: Pixie Casino, MD;  Location: [facility] ENDOSCOPY;  Service: Cardiovascular;  Laterality: N/A;  . LAPAROSCOPIC GASTRIC SLEEVE RESECTION N/A 07/15/2019   Procedure: LAPAROSCOPIC GASTRIC SLEEVE RESECTION, Upper Endo, ERAS Pathway;  Surgeon: [name], MD;  Location: [facility] ORS;  Service: General;  Laterality: N/A;  . REPLACEMENT TOTAL KNEE Bilateral 2008  . TONSILLECTOMY    . VASECTOMY      Current Medications:  Prior to Admission medications   Medication Sig Start Date End Date Taking? Authorizing Provider  acetaminophen (TYLENOL) 500 MG tablet Take 500 mg by mouth every 6 (six) hours as needed (for pain.).    Yes [provider]  amoxicillin (AMOXIL) 500 MG capsule Take 500 mg by mouth as needed (for dental visits).   Yes [provider]  azelastine (ASTELIN) 0.1 % nasal spray Place 2 sprays into both nostrils 2 (two) times daily. 06/11/19  Yes Vivi Barrack, MD  Calcium Carb-Cholecalciferol (CALCIUM-VITAMIN D) 600-400 MG-UNIT TABS Take 1 tablet by mouth in the morning, at noon, and at bedtime.   Yes [provider]  diphenhydramine-acetaminophen (TYLENOL PM) 25-500 MG TABS tablet Take 2 tablets by mouth at bedtime.    Yes [provider]  fexofenadine (ALLEGRA) 180 MG tablet Take 180 mg by mouth daily as needed for allergies or rhinitis.   Yes [provider]  Ginkgo Biloba 60 MG TABS Take 60 mg by mouth daily.   Yes [provider]  latanoprost (XALATAN) 0.005 % ophthalmic solution  Place 1 drop into both eyes at bedtime.  01/31/17  Yes [provider]  methocarbamol (ROBAXIN) 750 MG tablet Take 750 mg by mouth 2 (two) times daily as needed (back spasms.).  01/05/17  Yes [provider]  metoprolol succinate (TOPROL-XL) 50 MG 24 hr tablet Take 1 tablet daily with or immediately following a meal. ** DO NOT CRUSH **    (BETA BLOCKER). 01/20/19  Yes Vivi Barrack, MD  mometasone (NASONEX) 50 MCG/ACT nasal  spray Place 2 sprays into the nose at bedtime.    Yes [provider]  Multiple Vitamin (MULTIVITAMIN WITH MINERALS) TABS tablet Take 2 tablets by mouth daily. Bariatric Multivitamin   Yes [provider]  Multiple Vitamins-Minerals (AIRBORNE PO) Take 1 tablet by mouth daily as needed (immune health).    Yes [provider]  oxyCODONE (OXY IR/ROXICODONE) 5 MG immediate release tablet Take 1 tablet (5 mg total) by mouth every 6 (six) hours as needed for severe pain. 07/16/19  Yes [name], MD  pantoprazole (PROTONIX) 40 MG tablet Take 40 mg by mouth daily.   Yes [provider]  PENNSAID 2 % SOLN Apply 1 Pump topically 4 (four) times daily as needed (aching joints).  02/02/17  Yes [provider]  rivaroxaban (XARELTO) 20 MG TABS tablet Take 20 mg by mouth daily with supper.   Yes [provider]  rosuvastatin (CRESTOR) 20 MG tablet Take 1 tablet (20 mg total) by mouth daily. 01/20/19  Yes Vivi Barrack, MD  sertraline (ZOLOFT) 50 MG tablet TAKE 1 TABLET DAILY 08/08/19  Yes Vivi Barrack, MD  tadalafil (CIALIS) 10 MG tablet Take 1 tablet (10 mg total) by mouth daily as needed for erectile dysfunction. 12/05/18  Yes Vivi Barrack, MD  amLODipine (NORVASC) 5 MG tablet Take 1 tablet (5 mg total) by mouth daily. 06/27/19 10/01/19  Vivi Barrack, MD  valsartan (DIOVAN) 160 MG tablet Take 1 tablet (160 mg total) by mouth daily. 06/27/19 10/01/19  Vivi Barrack, MD    Allergies:   Patient has no known allergies.   Social History   Socioeconomic History  . Marital status: Married    Spouse name: Not on file  . Number of children: Not on file  . Years of education: Not on file  . Highest education level: Not on file  Occupational History  . Occupation: Retired   Tobacco Use  . Smoking status: Former Smoker    Types: Cigarettes    Quit date: 07/04/2001    Years since quitting: 18.2  . Smokeless tobacco: Never Used  Substance and Sexual  Activity  . Alcohol use: Yes    Comment: OCCASIONALLY  . Drug use: No  . Sexual activity: Yes    Comment: MARRIED  Other Topics Concern  . Not on file  Social History Narrative   Lives in [place], [place]   Married   2 kids   Retired - Prior worked at [employer] of [employer]:   . Difficulty of Paying Living Expenses:   Food Insecurity:   . Worried About Charity fundraiser in the Last Year:   . Arboriculturist in the Last Year:   Transportation Needs:   . Film/video editor (Medical):   Marland Kitchen Lack of Transportation (Non-Medical):   Physical Activity:   . Days of Exercise per Week:   . Minutes of Exercise per Session:   Stress:   .  Feeling of Stress :   Social Connections:   . Frequency of Communication with Friends and Family:   . Frequency of Social Gatherings with Friends and Family:   . Attends Religious Services:   . Active Member of Clubs or Organizations:   . Attends Archivist Meetings:   Marland Kitchen Marital Status:      Family History:  The patient's family history includes Alzheimer's disease in his mother; Arthritis in his father and mother; Heart attack in his mother; Heart attack (age of onset: 74) in his father; Hyperlipidemia in his brother, father, and mother; Hypertension in his brother, father, and mother; Other in his brother.   ROS:   Please see the history of present illness.    ROS All other systems reviewed and are negative.   PHYSICAL EXAM:   VS:  BP 128/80   Pulse 66   Ht 6\' 2"  (1.88 m)   Wt 299 lb 12.8 oz (136 kg)   SpO2 98%   BMI 38.49 kg/m    GEN: Well nourished, well developed, in no acute distress  HEENT: normal  Neck: no JVD, carotid bruits, or masses Cardiac: RRR; no murmurs, rubs, or gallops,no edema  Respiratory:  clear to auscultation bilaterally, normal work of breathing GI: soft, nontender, nondistended, + BS MS: no deformity or atrophy  Skin: warm and dry, no rash Neuro:  Alert and  Oriented x 3, Strength and sensation are intact Psych: euthymic mood, full affect  Wt Readings from Last 3 Encounters:  10/02/19 299 lb 12.8 oz (136 kg)  09/09/19 (!) 301 lb 4.8 oz (136.7 kg)  07/30/19 (!) 324 lb (147 kg)      Studies/Labs Reviewed:   EKG:  EKG is ordered today.  The ekg ordered today demonstrates atrial fibrillation at rate of 66 bpm  Recent Labs: 06/11/2019: TSH 1.66 07/07/2019: ALT 15; BUN 17; Potassium 4.7; Sodium 141 07/15/2019: Creatinine, Ser 0.83 07/16/2019: Hemoglobin 15.5; Platelets 206   Lipid Panel    Component Value Date/Time   CHOL 145 06/11/2019 0944   CHOL 133 02/18/2019 0836   TRIG 166.0 (H) 06/11/2019 0944   HDL 40.80 06/11/2019 0944   HDL 40 02/18/2019 0836   CHOLHDL 4 06/11/2019 0944   VLDL 33.2 06/11/2019 0944   LDLCALC 71 06/11/2019 0944   LDLCALC 63 02/18/2019 0836    Additional studies/ records that were reviewed today include:   Echocardiogram: 06/2016 Study Conclusions   - Left ventricle: The cavity size was normal. Wall thickness was  increased in a pattern of mild LVH. Systolic function was normal.  The estimated ejection fraction was in the range of 55% to 60%.  Wall motion was normal; there were no regional wall motion  abnormalities. Doppler parameters are consistent with abnormal  left ventricular relaxation (grade 1 diastolic dysfunction).  - Left atrium: The atrium was mildly dilated. Volume/bsa, S: 37.4  ml/m^2.   ASSESSMENT & PLAN:    1. Persistent atrial fibrillation Controlled.  Continue beta-blocker and Xarelto.  No bleeding issue.  2.  Hypertension -Blood pressure stable and well-controlled on current medications.  3.  Obstructive sleep apnea -Compliant with CPAP  4.  Recent gastric sleeve -Recovering well  5.  Surgical clearance -Patient is easily getting greater than 4 METS of activity.  No symptoms concerning for angina or CHF. - Given past medical history and time since last visit,  based on ACC/AHA guidelines, JOHNPATRICK EMENS would be at acceptable risk for the planned procedure without further  cardiovascular testing.   Per Pharmacist "Procedure: Right Hip Arthroplasty Date of procedure: 10/21/19  CHADS2-VASc score of  3 (CHF, HTN, AGE)  CrCl 121 ml/min  Per office protocol, patient can hold Xarelto for 3 days prior to procedure.    For orthopedic procedures please be sure to resume therapeutic (not prophylactic) dosing".  I will route this recommendation to the requesting party via Epic fax function and remove from pre-op pool.  Please call with questions.  [place], [place] 10/02/2019, 11:25 AM        Medication Adjustments/Labs and Tests Ordered: Current medicines are reviewed at length with the patient today.  Concerns regarding medicines are outlined above.  Medication changes, Labs and Tests ordered today are listed in the Patient Instructions below. Patient Instructions  Medication Instructions:  Your physician recommends that you continue on your current medications as directed. Please refer to the Current Medication list given to you today.  *If you need a refill on your cardiac medications before your next appointment, please call your pharmacy*   Lab Work: None ordered  If you have labs (blood work) drawn today and your tests are completely normal, you will receive your results only by: Marland Kitchen MyChart Message (if you have MyChart) OR . A paper copy in the mail If you have any lab test that is abnormal or we need to change your treatment, we will call you to review the results.   Testing/Procedures: None ordered   Follow-Up: At [facility], you and your health needs are our priority.  As part of our continuing mission to provide you with exceptional heart care, we have created designated Provider Care Teams.  These Care Teams include your primary Cardiologist (physician) and Advanced Practice Providers (APPs -  Physician Assistants  and Nurse Practitioners) who all work together to provide you with the care you need, when you need it.  We recommend signing up for the patient portal called "MyChart".  Sign up information is provided on this After Visit Summary.  MyChart is used to connect with patients for Virtual Visits (Telemedicine).  Patients are able to view lab/test results, encounter notes, upcoming appointments, etc.  Non-urgent messages can be sent to your provider as well.   To learn more about what you can do with MyChart, go to [email or web address].    Your next appointment:   6 month(s)  The format for your next appointment:   In Person  Provider:   You may see Dr. Irish Lack or one of the following Advanced Practice Providers on your designated Care Team:    Melina Copa, PA-C  Ermalinda Barrios, PA-C    Other Instructions      Signed, Leanor Kail, [place]  10/02/2019 11:25 AM    [address], [place], [place]  [address] Phone: [phone]; Fax: [phone]

## 2019-10-02 NOTE — Progress Notes (Signed)
Chief Complaint:  Isaiah Jones is a 69 y.o. male who presents today for consultation for surgical clearance at the request of Dr. Rhona Raider.  Assessment/Plan:  Encounter For Surgical Clearance Patient has already been cleared by cardiology.  A1c today is within normal ranges.  He is clear from primary care standpoint.  We will complete requested form and fax back to orthopedist.  Chronic Problems Addressed Today: Osteoarthritis Continue management per orthopedics.  Currently on Robaxin as needed.  Essential hypertension Stable.  Continue amlodipine 5 mg daily and valsartan 160 mg daily.  Persistent atrial fibrillation (HCC) Stable.  Continue management per cardiology.     Subjective:  HPI:  Patient here today for preoperative risk assessment at the request of Dr. Rhona Raider for right anterior hip arthroplasty.  He has already been cleared by his cardiologist.  Patient has longstanding history of osteoarthritis.  He has failed conservative management.  Symptoms are progressive and worsening.  His stable, chronic medical conditions are outlined below:   ROS: Per HPI, otherwise a complete review of systems was negative.   PMH:  The following were reviewed and entered/updated in epic: Past Medical History:  Diagnosis Date  . Arthritis, degenerative    hip,shoulders, hands, back  . 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 CHF (congestive heart failure) ([place])   . Colon polyps   . Colon polyps   . Complication of anesthesia   . Dyslipidemia   . Dysrhythmia 2017   A fib  . Family history of adverse reaction to anesthesia   . Glaucoma   . Hyperlipidemia   . Hypertension   . OSA on CPAP    C-Pap  . Persistent atrial fibrillation ([place])    Echo 3/18: Mild LVH, EF 55-60, normal wall motion, grade 1 diastolic dysfunction, mild LAE  . PONV (postoperative nausea and vomiting)   . Rhinitis, allergic    Patient Active Problem List   Diagnosis  Date Noted  . 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 Vitamin D deficiency 06/16/2019  . Erectile dysfunction 12/05/2018  . Essential hypertension 12/05/2018  . Gout 12/05/2018  . Osteoarthritis 12/05/2018  . Seasonal allergies 12/05/2018  . 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 Morbid obesity ([place]) 12/05/2018  . Persistent atrial fibrillation ([place])   . OSA on CPAP   . Dyslipidemia    Past Surgical History:  Procedure Laterality Date  . arthroscopic knee surgery Bilateral 2003  . CARDIOVERSION N/A 08/10/2016   Procedure: CARDIOVERSION;  Surgeon: Pixie Casino, MD;  Location: [facility] ENDOSCOPY;  Service: Cardiovascular;  Laterality: N/A;  . LAPAROSCOPIC GASTRIC SLEEVE RESECTION N/A 07/15/2019   Procedure: LAPAROSCOPIC GASTRIC SLEEVE RESECTION, Upper Endo, ERAS Pathway;  Surgeon: [name], MD;  Location: [facility] ORS;  Service: General;  Laterality: N/A;  . REPLACEMENT TOTAL KNEE Bilateral 2008  . TONSILLECTOMY    . VASECTOMY      Family History  Problem Relation Age of Onset  . Alzheimer's disease Mother   . Heart attack Mother   . Hyperlipidemia Mother   . Hypertension Mother   . Arthritis Mother   . Heart attack Father 87  . Arthritis Father   . Hyperlipidemia Father   . Hypertension Father   . Hypertension Brother   . Hyperlipidemia Brother   . Other Brother        [place]  . Atrial fibrillation Neg Hx     Medications- reviewed and updated Current Outpatient Medications  Medication Sig Dispense Refill  . acetaminophen (TYLENOL) 500 MG tablet Take 500 mg by mouth every 6 (six) hours as needed (for pain.).     Marland Kitchen amoxicillin (AMOXIL) 500 MG capsule Take 500 mg by mouth as needed (for dental visits).    Marland Kitchen azelastine (ASTELIN) 0.1 % nasal spray Place 2 sprays into both nostrils 2 (two) times daily. 30 mL 12  . Calcium Carb-Cholecalciferol (CALCIUM-VITAMIN D) 600-400 MG-UNIT TABS Take 1 tablet by mouth in the morning, at noon, and at  bedtime.    . diphenhydramine-acetaminophen (TYLENOL PM) 25-500 MG TABS tablet Take 2 tablets by mouth at bedtime.     . fexofenadine (ALLEGRA) 180 MG tablet Take 180 mg by mouth daily as needed for allergies or rhinitis.    . Ginkgo Biloba 60 MG TABS Take 60 mg by mouth daily.    Marland Kitchen latanoprost (XALATAN) 0.005 % ophthalmic solution Place 1 drop into both eyes at bedtime.     . methocarbamol (ROBAXIN) 750 MG tablet Take 750 mg by mouth 2 (two) times daily as needed (back spasms.).     Marland Kitchen metoprolol succinate (TOPROL-XL) 50 MG 24 hr tablet Take 1 tablet daily with or immediately following a meal. ** DO NOT CRUSH **    (BETA BLOCKER). 90 tablet 3  . mometasone (NASONEX) 50 MCG/ACT nasal spray Place 2 sprays into the nose at bedtime.     . Multiple Vitamin (MULTIVITAMIN WITH MINERALS) TABS tablet Take 2 tablets by mouth daily. Bariatric Multivitamin    . Multiple Vitamins-Minerals (AIRBORNE PO) Take 1 tablet by mouth daily as needed (immune health).     . oxyCODONE (OXY IR/ROXICODONE) 5 MG immediate release tablet Take 1 tablet (5 mg total) by mouth every 6 (six) hours as needed for severe pain. 10 tablet 0  . pantoprazole (PROTONIX) 40 MG tablet Take 40 mg by mouth daily.    Marland Kitchen PENNSAID 2 % SOLN Apply 1 Pump topically 4 (four) times daily as needed (aching joints).   1  . rivaroxaban (XARELTO) 20 MG TABS tablet Take 20 mg by mouth daily with supper.    . rosuvastatin (CRESTOR) 20 MG tablet Take 1 tablet (20 mg total) by mouth daily. 90 tablet 3  . sertraline (ZOLOFT) 50 MG tablet TAKE 1 TABLET DAILY 90 tablet 0  . tadalafil (CIALIS) 10 MG tablet Take 1 tablet (10 mg total) by mouth daily as needed for erectile dysfunction. 30 tablet 0  . amLODipine (NORVASC) 5 MG tablet Take 1 tablet (5 mg total) by mouth daily. 90 tablet 1  . valsartan (DIOVAN) 160 MG tablet Take 1 tablet (160 mg total) by mouth daily. 90 tablet 1   No current facility-administered medications for this visit.    Allergies-reviewed  and updated No Known Allergies  Social History   Socioeconomic History  . Marital status: Married    Spouse name: Not on file  . Number of children: Not on file  . Years of education: Not on file  . Highest education level: Not on file  Occupational History  . Occupation: Retired   Tobacco Use  . Smoking status: Former Smoker    Types: Cigarettes    Quit date: 07/04/2001    Years since quitting: 18.2  . Smokeless tobacco: Never Used  Substance and Sexual Activity  . Alcohol use: Yes    Comment: OCCASIONALLY  . Drug use: No  . Sexual activity: Yes    Comment: MARRIED  Other Topics Concern  . Not on  file  Social History Narrative   Lives in [place], [place]   Married   2 kids   Retired - Prior worked at [employer] of [employer]:   . Difficulty of Paying Living Expenses:   Food Insecurity:   . Worried About Charity fundraiser in the Last Year:   . Arboriculturist in the Last Year:   Transportation Needs:   . Film/video editor (Medical):   Marland Kitchen Lack of Transportation (Non-Medical):   Physical Activity:   . Days of Exercise per Week:   . Minutes of Exercise per Session:   Stress:   . Feeling of Stress :   Social Connections:   . Frequency of Communication with Friends and Family:   . Frequency of Social Gatherings with Friends and Family:   . Attends Religious Services:   . Active Member of Clubs or Organizations:   . Attends Archivist Meetings:   Marland Kitchen Marital Status:          Objective:  Physical Exam: BP 130/90 (BP Location: Left Arm, Patient Position: Sitting, Cuff Size: Large)   Pulse 68   Temp (!) 97.3 F (36.3 C) (Temporal)   Ht 6\' 2"  (1.88 m)   Wt 299 lb 8 oz (135.9 kg)   SpO2 96%   BMI 38.45 kg/m   Wt Readings from Last 3 Encounters:  10/02/19 299 lb 8 oz (135.9 kg)  10/02/19 299 lb 12.8 oz (136 kg)  09/09/19 (!) 301 lb 4.8 oz (136.7 kg)  Gen: NAD, resting comfortably CV: Regular rate and  rhythm with no murmurs appreciated Pulm: Normal work of breathing, clear to auscultation bilaterally with no crackles, wheezes, or rhonchi GI: Normal bowel sounds present. Soft, Nontender, Nondistended. MSK: No edema, cyanosis, or clubbing noted Skin: Warm, dry Neuro: Grossly normal, moves all extremities Psych: Normal affect and thought content  Results for orders placed or performed in visit on 10/02/19 (from the past 24 hour(s))  POCT glycosylated hemoglobin (Hb A1C)     Status: None   Collection Time: 10/02/19  1:43 PM  Result Value Ref Range   Hemoglobin A1C 5.1 4.0 - 5.6 %   HbA1c POC (<> result, manual entry)     HbA1c, POC (prediabetic range)     HbA1c, POC (controlled diabetic range)          A copy of this note will be forwarded to the requesting physician.   Algis Greenhouse. Jerline Pain, MD 10/02/2019 2:30 PM

## 2019-10-02 NOTE — Assessment & Plan Note (Signed)
Stable.  Continue amlodipine 5 mg daily and valsartan 160 mg daily.

## 2019-10-02 NOTE — Assessment & Plan Note (Signed)
Continue management per orthopedics.  Currently on Robaxin as needed.

## 2019-10-02 NOTE — Patient Instructions (Addendum)
It was very nice to see you today!  We will fax your surgical clearance form.  You are cleared to have surgery.  I will see back in a couple of months for your annual physical.  Please come back to see me sooner if needed.  Take care, Dr Jerline Pain  Please try these tips to maintain a healthy lifestyle:   Eat at least 3 REAL meals and 1-2 snacks per day.  Aim for no more than 5 hours between eating.  If you eat breakfast, please do so within one hour of getting up.    Each meal should contain half fruits/vegetables, one quarter protein, and one quarter carbs (no bigger than a computer mouse)   Cut down on sweet beverages. This includes juice, soda, and sweet tea.     Drink at least 1 glass of water with each meal and aim for at least 8 glasses per day   Exercise at least 150 minutes every week.   Physical

## 2019-10-02 NOTE — Telephone Encounter (Signed)
Patient with diagnosis of afib on Xarelto for anticoagulation.    Procedure: Right Hip Arthroplasty  Date of procedure: 10/21/19  CHADS2-VASc score of  3 (CHF, HTN, AGE)  CrCl 121 ml/min  Per office protocol, patient can hold Xarelto for 3 days prior to procedure.    For orthopedic procedures please be sure to resume therapeutic (not prophylactic) dosing.

## 2019-10-02 NOTE — Telephone Encounter (Signed)
Patient was cleared this morning by Gilberto Better PA-C during cardiology office visit. He has forwarded a copy of his note to the surgeon. Will remove from pool

## 2019-10-13 NOTE — Patient Instructions (Addendum)
DUE TO COVID-19 ONLY ONE VISITOR ARE ALLOWED TO COME WITH YOU AND STAY IN THE WAITING ROOM ONLY DURING PRE OP AND PROCEDURE. THEN TWO VISITORS MAY VISIT WITH YOU IN YOUR PRIVATE ROOM DURING VISITING HOURS ONLY!!   COVID SWAB TESTING MUST BE COMPLETED ON:  Friday, October 17, 2019 at 9:25 AM  [address], [place] [facility] enter pre surgical testing line (Must self quarantine after testing. Follow instructions on handout.)             Your procedure is scheduled on: Tuesday, October 21, 2019   Report to [facility] Main  Entrance    Report to admitting at 12:45 PM   Call this number if you have problems the morning of surgery [phone]   Do not eat food  :After Midnight.    May have liquids until 12:15 PM day of surgery   CLEAR LIQUID DIET  Foods Allowed                                                                     Foods Excluded  Water, Black Coffee and tea, regular and decaf                             liquids that you cannot  Plain Jell-O in any flavor  (No red)                                           see through such as: Fruit ices (not with fruit pulp)                                     milk, soups, orange juice  Iced Popsicles (No red)                                    All solid food                                   Apple juices Sports drinks like Gatorade (No red) Lightly seasoned clear broth or consume(fat free) Sugar, honey syrup  Sample Menu Breakfast                                Lunch                                     Supper Cranberry juice                    Beef broth                            Chicken broth Jell-O  Grape juice                           Apple juice Coffee or tea                        Jell-O                                      Popsicle                                                Coffee or tea                        Coffee or tea   Complete one Ensure drink the  morning of surgery at 12:15PM the day of surgery.   Oral Hygiene is also important to reduce your risk of infection.                                    Remember - BRUSH YOUR TEETH THE MORNING OF SURGERY WITH YOUR REGULAR TOOTHPASTE   Do NOT smoke after Midnight   Take these medicines the morning of surgery with A SIP OF WATER: Amlodipine, Pantoprazole, Rosuvastatin   DO NOT TAKE CIALIS 24 HOURS PRIOR TO SURGERY   BRING CPAP MASK AND TUBING DAY OF SURGERY                               You may not have any metal on your body including jewelry, and body piercings             Do not wear lotions, powders, perfumes/cologne, or deodorant                          Men may shave face and neck.   Do not bring valuables to the hospital. [place].   Contacts, dentures or bridgework may not be worn into surgery.   Bring small overnight bag day of surgery.    Patients discharged the day of surgery will not be allowed to drive home.   Special Instructions: Bring a copy of your healthcare power of attorney and living will documents         the day of surgery if you haven't scanned them in before.              Please read over the following fact sheets you were given: IF YOU HAVE QUESTIONS ABOUT YOUR PRE OP INSTRUCTIONS PLEASE CALL [phone]    - Preparing for Surgery Before surgery, you can play an important role.  Because skin is not sterile, your skin needs to be as free of germs as possible.  You can reduce the number of germs on your skin by washing with CHG (chlorahexidine gluconate) soap before surgery.  CHG is an antiseptic cleaner which kills germs and bonds with the skin to continue killing germs even after  washing. Please DO NOT use if you have an allergy to CHG or antibacterial soaps.  If your skin becomes reddened/irritated stop using the CHG and inform your nurse when you arrive at Short Stay. Do not shave (including legs  and underarms) for at least 48 hours prior to the first CHG shower.  You may shave your face/neck.  Please follow these instructions carefully:  1.  Shower with CHG Soap the night before surgery and the  morning of surgery.  2.  If you choose to wash your hair, wash your hair first as usual with your normal  shampoo.  3.  After you shampoo, rinse your hair and body thoroughly to remove the shampoo.                             4.  Use CHG as you would any other liquid soap.  You can apply chg directly to the skin and wash.  Gently with a scrungie or clean washcloth.  5.  Apply the CHG Soap to your body ONLY FROM THE NECK DOWN.   Do   not use on face/ open                           Wound or open sores. Avoid contact with eyes, ears mouth and   genitals (private parts).                       Wash face,  Genitals (private parts) with your normal soap.             6.  Wash thoroughly, paying special attention to the area where your    surgery  will be performed.  7.  Thoroughly rinse your body with warm water from the neck down.  8.  DO NOT shower/wash with your normal soap after using and rinsing off the CHG Soap.                9.  Pat yourself dry with a clean towel.            10.  Wear clean pajamas.            11.  Place clean sheets on your bed the night of your first shower and do not  sleep with pets. Day of Surgery : Do not apply any lotions/deodorants the morning of surgery.  Please wear clean clothes to the hospital/surgery center.  FAILURE TO FOLLOW THESE INSTRUCTIONS MAY RESULT IN THE CANCELLATION OF YOUR SURGERY  PATIENT SIGNATURE_________________________________  NURSE SIGNATURE__________________________________  ________________________________________________________________________   Isaiah Jones  An incentive spirometer is a tool that can help keep your lungs clear and active. This tool measures how well you are filling your lungs with each breath. Taking long  deep breaths may help reverse or decrease the chance of developing breathing (pulmonary) problems (especially infection) following:  A long period of time when you are unable to move or be active. BEFORE THE PROCEDURE   If the spirometer includes an indicator to show your best effort, your nurse or respiratory therapist will set it to a desired goal.  If possible, sit up straight or lean slightly forward. Try not to slouch.  Hold the incentive spirometer in an upright position. INSTRUCTIONS FOR USE  1. Sit on the edge of your bed if possible, or sit up as far as you can in bed  or on a chair. 2. Hold the incentive spirometer in an upright position. 3. Breathe out normally. 4. Place the mouthpiece in your mouth and seal your lips tightly around it. 5. Breathe in slowly and as deeply as possible, raising the piston or the ball toward the top of the column. 6. Hold your breath for 3-5 seconds or for as long as possible. Allow the piston or ball to fall to the bottom of the column. 7. Remove the mouthpiece from your mouth and breathe out normally. 8. Rest for a few seconds and repeat Steps 1 through 7 at least 10 times every 1-2 hours when you are awake. Take your time and take a few normal breaths between deep breaths. 9. The spirometer may include an indicator to show your best effort. Use the indicator as a goal to work toward during each repetition. 10. After each set of 10 deep breaths, practice coughing to be sure your lungs are clear. If you have an incision (the cut made at the time of surgery), support your incision when coughing by placing a pillow or rolled up towels firmly against it. Once you are able to get out of bed, walk around indoors and cough well. You may stop using the incentive spirometer when instructed by your caregiver.  RISKS AND COMPLICATIONS  Take your time so you do not get dizzy or light-headed.  If you are in pain, you may need to take or ask for pain medication  before doing incentive spirometry. It is harder to take a deep breath if you are having pain. AFTER USE  Rest and breathe slowly and easily.  It can be helpful to keep track of a log of your progress. Your caregiver can provide you with a simple table to help with this. If you are using the spirometer at home, follow these instructions: [place] IF:   You are having difficultly using the spirometer.  You have trouble using the spirometer as often as instructed.  Your pain medication is not giving enough relief while using the spirometer.  You develop fever of 100.5 F (38.1 C) or higher. SEEK IMMEDIATE MEDICAL CARE IF:   You cough up bloody sputum that had not been present before.  You develop fever of 102 F (38.9 C) or greater.  You develop worsening pain at or near the incision site. MAKE SURE YOU:   Understand these instructions.  Will watch your condition.  Will get help right away if you are not doing well or get worse. Document Released: 08/28/2006 Document Revised: 07/10/2011 Document Reviewed: 10/29/2006 ExitCare Patient Information 2014 ExitCare, [place].   ________________________________________________________________________  WHAT IS A BLOOD TRANSFUSION? Blood Transfusion Information  A transfusion is the replacement of blood or some of its parts. Blood is made up of multiple cells which provide different functions.  Red blood cells carry oxygen and are used for blood loss replacement.  White blood cells fight against infection.  Platelets control bleeding.  Plasma helps clot blood.  Other blood products are available for specialized needs, such as hemophilia or other clotting disorders. BEFORE THE TRANSFUSION  Who gives blood for transfusions?   Healthy volunteers who are fully evaluated to make sure their blood is safe. This is blood bank blood. Transfusion therapy is the safest it has ever been in the practice of medicine. Before blood is  taken from a donor, a complete history is taken to make sure that person has no history of diseases nor engages in risky  social behavior (examples are intravenous drug use or sexual activity with multiple partners). The donor's travel history is screened to minimize risk of transmitting infections, such as malaria. The donated blood is tested for signs of infectious diseases, such as HIV and hepatitis. The blood is then tested to be sure it is compatible with you in order to minimize the chance of a transfusion reaction. If you or a relative donates blood, this is often done in anticipation of surgery and is not appropriate for emergency situations. It takes many days to process the donated blood. RISKS AND COMPLICATIONS Although transfusion therapy is very safe and saves many lives, the main dangers of transfusion include:   Getting an infectious disease.  Developing a transfusion reaction. This is an allergic reaction to something in the blood you were given. Every precaution is taken to prevent this. The decision to have a blood transfusion has been considered carefully by your caregiver before blood is given. Blood is not given unless the benefits outweigh the risks. AFTER THE TRANSFUSION  Right after receiving a blood transfusion, you will usually feel much better and more energetic. This is especially true if your red blood cells have gotten low (anemic). The transfusion raises the level of the red blood cells which carry oxygen, and this usually causes an energy increase.  The nurse administering the transfusion will monitor you carefully for complications. HOME CARE INSTRUCTIONS  No special instructions are needed after a transfusion. You may find your energy is better. Speak with your caregiver about any limitations on activity for underlying diseases you may have. SEEK MEDICAL CARE IF:   Your condition is not improving after your transfusion.  You develop redness or irritation at the  intravenous (IV) site. SEEK IMMEDIATE MEDICAL CARE IF:  Any of the following symptoms occur over the next 12 hours:  Shaking chills.  You have a temperature by mouth above 102 F (38.9 C), not controlled by medicine.  Chest, back, or muscle pain.  People around you feel you are not acting correctly or are confused.  Shortness of breath or difficulty breathing.  Dizziness and fainting.  You get a rash or develop hives.  You have a decrease in urine output.  Your urine turns a dark color or changes to pink, red, or brown. Any of the following symptoms occur over the next 10 days:  You have a temperature by mouth above 102 F (38.9 C), not controlled by medicine.  Shortness of breath.  Weakness after normal activity.  The white part of the eye turns yellow (jaundice).  You have a decrease in the amount of urine or are urinating less often.  Your urine turns a dark color or changes to pink, red, or brown. Document Released: 04/14/2000 Document Revised: 07/10/2011 Document Reviewed: 12/02/2007 [facility] Patient Information 2014 [place], [place].  _______________________________________________________________________

## 2019-10-14 ENCOUNTER — Encounter (HOSPITAL_COMMUNITY): Payer: Self-pay

## 2019-10-14 ENCOUNTER — Encounter (HOSPITAL_COMMUNITY)
Admission: RE | Admit: 2019-10-14 | Discharge: 2019-10-14 | Disposition: A | Discharge status: Home / Self Care | Payer: Medicare Other | Source: Ambulatory Visit | Attending: Orthopaedic Surgery | Admitting: Orthopaedic Surgery

## 2019-10-14 ENCOUNTER — Encounter (HOSPITAL_COMMUNITY): Admission: RE | Admit: 2019-10-14 | Payer: Medicare Other | Source: Ambulatory Visit

## 2019-10-14 ENCOUNTER — Other Ambulatory Visit: Payer: Self-pay

## 2019-10-14 HISTORY — DX: Depression, unspecified: F32.A

## 2019-10-14 HISTORY — DX: Gastro-esophageal reflux disease without esophagitis: K21.9

## 2019-10-14 HISTORY — DX: Pneumonia, unspecified organism: J18.9

## 2019-10-14 HISTORY — DX: Anxiety disorder, unspecified: F41.9

## 2019-10-14 HISTORY — DX: Personal history of urinary calculi: Z87.442

## 2019-10-14 NOTE — Progress Notes (Signed)
COVID Vaccine Completed: No Date COVID Vaccine completed: N/A COVID vaccine manufacturer: N/A  PCP - Dr. Zack Seal last office visit and pre op clearance 10/05/19 in epic Cardiologist - Dr. Deatra Qualyn last office visit and clearance 10/02/19 in epic  Chest x-ray - 10/14/2019 in epic EKG - 10/02/2019 in epic Stress Test - N/A ECHO - greater than 2 years Cardiac Cath - N/A  Sleep Study - Yes CPAP - Yes  Fasting Blood Sugar - N/A Checks Blood Sugar ___N/A__ times a day  Blood Thinner Instructions: Xarelto last day 10/17/2019 Aspirin Instructions: N/A Last Dose:N/A  Anesthesia review: PAF, OSA, CHF, HTN  Patient denies shortness of breath, fever, cough and chest pain at PAT appointment   Patient verbalized understanding of instructions that were given to them at the PAT appointment. Patient was also instructed that they will need to review over the PAT instructions again at home before surgery.

## 2019-10-16 NOTE — H&P (Signed)
TOTAL HIP ADMISSION H&P  Patient is admitted for right total hip arthroplasty.  Subjective:  Chief Complaint: right hip pain  HPI: Isaiah Jones, 69 y.o. male, has a history of pain and functional disability in the right hip(s) due to arthritis and patient has failed non-surgical conservative treatments for greater than 12 weeks to include NSAID's and/or analgesics, corticosteriod injections, flexibility and strengthening excercises, supervised PT with diminished ADL's post treatment, use of assistive devices, weight reduction as appropriate and activity modification.  Onset of symptoms was gradual starting 5 years ago with gradually worsening course since that time.The patient noted no past surgery on the right hip(s).  Patient currently rates pain in the right hip at 10 out of 10 with activity. Patient has night pain, worsening of pain with activity and weight bearing, trendelenberg gait, pain that interfers with activities of daily living and crepitus. Patient has evidence of subchondral cysts, subchondral sclerosis, periarticular osteophytes and joint space narrowing by imaging studies. This condition presents safety issues increasing the risk of falls.  There is no current active infection.  Patient Active Problem List   Diagnosis Date Noted  . 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 Vitamin D deficiency 06/16/2019  . Erectile dysfunction 12/05/2018  . Essential hypertension 12/05/2018  . Gout 12/05/2018  . Osteoarthritis 12/05/2018  . Seasonal allergies 12/05/2018  . 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 Morbid obesity ([place]) 12/05/2018  . Persistent atrial fibrillation ([place])   . OSA on CPAP   . Dyslipidemia    Past Medical History:  Diagnosis Date  . Anxiety   . Arthritis, degenerative    hip,shoulders, hands, back  . 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 CHF (congestive heart failure) ([place])   . Colon polyps    . Complication of anesthesia   . Depression   . Dyslipidemia   . Dysrhythmia 2017   A fib  . Family history of adverse reaction to anesthesia    unaware  . GERD (gastroesophageal reflux disease)   . Glaucoma    early stage  . History of 2019 novel coronavirus disease (COVID-19) 06/2019  . History of kidney stones   . Hypertension   . OSA on CPAP    C-Pap  . Persistent atrial fibrillation ([place])    Echo 3/18: Mild LVH, EF 55-60, normal wall motion, grade 1 diastolic dysfunction, mild LAE  . Pneumonia    childhood  . PONV (postoperative nausea and vomiting)    after 1st knee surgery only  . Rhinitis, allergic     Past Surgical History:  Procedure Laterality Date  . arthroscopic knee surgery Bilateral 2003  . CARDIOVERSION N/A 08/10/2016   Procedure: CARDIOVERSION;  Surgeon: Pixie Casino, MD;  Location: [facility] ENDOSCOPY;  Service: Cardiovascular;  Laterality: N/A;  . COLONOSCOPY    . LAPAROSCOPIC GASTRIC SLEEVE RESECTION N/A 07/15/2019   Procedure: LAPAROSCOPIC GASTRIC SLEEVE RESECTION, Upper Endo, ERAS Pathway;  Surgeon: [name], MD;  Location: [facility] ORS;  Service: General;  Laterality: N/A;  . REPLACEMENT TOTAL KNEE Bilateral 2008  . TONSILLECTOMY    . VASECTOMY      No current facility-administered medications for this encounter.   Current Outpatient Medications  Medication Sig Dispense Refill Last Dose  . acetaminophen (TYLENOL) 500 MG tablet Take 500 mg by mouth every 6 (six) hours as needed (for pain.).      Marland Kitchen amLODipine (NORVASC) 5 MG tablet Take 1 tablet (5 mg total) by  mouth daily. 90 tablet 1   . azelastine (ASTELIN) 0.1 % nasal spray Place 2 sprays into both nostrils 2 (two) times daily. 30 mL 12   . Calcium Carb-Cholecalciferol (CALCIUM-VITAMIN D) 600-400 MG-UNIT TABS Take 1 tablet by mouth in the morning, at noon, and at bedtime.     . diphenhydramine-acetaminophen (TYLENOL PM) 25-500 MG TABS tablet Take 2 tablets by mouth at bedtime.      . fexofenadine  (ALLEGRA) 180 MG tablet Take 180 mg by mouth daily as needed for allergies or rhinitis.     . Ginkgo Biloba 60 MG TABS Take 60 mg by mouth daily.     Marland Kitchen latanoprost (XALATAN) 0.005 % ophthalmic solution Place 1 drop into both eyes at bedtime.      . methocarbamol (ROBAXIN) 750 MG tablet Take 750 mg by mouth 2 (two) times daily as needed (back spasms.).      Marland Kitchen metoprolol succinate (TOPROL-XL) 50 MG 24 hr tablet Take 1 tablet daily with or immediately following a meal. ** DO NOT CRUSH **    (BETA BLOCKER). 90 tablet 3   . mometasone (NASONEX) 50 MCG/ACT nasal spray Place 2 sprays into the nose at bedtime.      . Multiple Vitamin (MULTIVITAMIN WITH MINERALS) TABS tablet Take 2 tablets by mouth daily. Bariatric Multivitamin     . Multiple Vitamins-Minerals (AIRBORNE PO) Take 1 tablet by mouth daily as needed (immune health).      . pantoprazole (PROTONIX) 40 MG tablet Take 40 mg by mouth daily.     Marland Kitchen PENNSAID 2 % SOLN Apply 1 Pump topically 4 (four) times daily as needed (aching joints).   1   . rosuvastatin (CRESTOR) 20 MG tablet Take 1 tablet (20 mg total) by mouth daily. 90 tablet 3   . sertraline (ZOLOFT) 50 MG tablet TAKE 1 TABLET DAILY 90 tablet 0   . valsartan (DIOVAN) 160 MG tablet Take 1 tablet (160 mg total) by mouth daily. 90 tablet 1   . amoxicillin (AMOXIL) 500 MG capsule Take 500 mg by mouth as needed (for dental visits).     . oxyCODONE (OXY IR/ROXICODONE) 5 MG immediate release tablet Take 1 tablet (5 mg total) by mouth every 6 (six) hours as needed for severe pain. 10 tablet 0 Not Taking at Unknown time  . rivaroxaban (XARELTO) 20 MG TABS tablet Take 20 mg by mouth daily with supper.     . tadalafil (CIALIS) 10 MG tablet Take 1 tablet (10 mg total) by mouth daily as needed for erectile dysfunction. 30 tablet 0    No Known Allergies  Social History   Tobacco Use  . Smoking status: Former Smoker    Types: Cigarettes    Quit date: 07/04/2001    Years since quitting: 18.2  . Smokeless  tobacco: Never Used  Substance Use Topics  . Alcohol use: Not Currently    Comment: OCCASIONALLY    Family History  Problem Relation Age of Onset  . Alzheimer's disease Mother   . Heart attack Mother   . Hyperlipidemia Mother   . Hypertension Mother   . Arthritis Mother   . Heart attack Father 75  . Arthritis Father   . Hyperlipidemia Father   . Hypertension Father   . Hypertension Brother   . Hyperlipidemia Brother   . Other Brother        [place]  . Atrial fibrillation Neg Hx      Review of Systems  Musculoskeletal: Positive for  arthralgias.       Right hip  All other systems reviewed and are negative.   Objective:  Physical Exam  Constitutional: He is oriented to person, place, and time.  HENT:  Head: Normocephalic.  Eyes: Pupils are equal, round, and reactive to light.  Cardiovascular: Normal rate, regular rhythm and normal pulses.  Respiratory: Effort normal.  GI: Soft. Normal appearance.  Musculoskeletal:     Cervical back: Normal range of motion.     Comments: Right hip motion is extremely painful and limited.  His leg lengths are roughly equal.  He walks with a markedly altered gait.  Sensation and motor function are intact in his feet with palpable pulses on both sides.    Neurological: He is alert and oriented to person, place, and time.  Skin: Skin is warm and dry.  Psychiatric: His behavior is normal. Mood and thought content normal.    Vital signs in last 24 hours:    Labs:   Estimated body mass index is 37.23 kg/m as calculated from the following:   Height as of 10/14/19: 6\' 2"  (1.88 m).   Weight as of 10/14/19: 131.5 kg.   Imaging Review Plain radiographs demonstrate severe degenerative joint disease of the right hip(s). The bone quality appears to be good for age and reported activity level.      Assessment/Plan:  End stage arthritis, left hip(s)  The patient history, physical examination, clinical judgement of the provider and  imaging studies are consistent with end stage degenerative joint disease of the right hip(s) and total hip arthroplasty is deemed medically necessary. The treatment options including medical management, injection therapy, arthroscopy and arthroplasty were discussed at length. The risks and benefits of total hip arthroplasty were presented and reviewed. The risks due to aseptic loosening, infection, stiffness, dislocation/subluxation,  thromboembolic complications and other imponderables were discussed.  The patient acknowledged the explanation, agreed to proceed with the plan and consent was signed. Patient is being admitted for inpatient treatment for surgery, pain control, PT, OT, prophylactic antibiotics, VTE prophylaxis, progressive ambulation and ADL's and discharge planning.The patient is planning to be discharged home with home health services

## 2019-10-17 ENCOUNTER — Other Ambulatory Visit (HOSPITAL_COMMUNITY)
Admission: RE | Admit: 2019-10-17 | Discharge: 2019-10-17 | Disposition: A | Discharge status: Home / Self Care | Payer: Medicare Other | Source: Ambulatory Visit | Attending: Orthopaedic Surgery | Admitting: Orthopaedic Surgery

## 2019-10-17 ENCOUNTER — Ambulatory Visit (HOSPITAL_COMMUNITY)
Admission: RE | Admit: 2019-10-17 | Discharge: 2019-10-17 | Disposition: A | Discharge status: Home / Self Care | Payer: Medicare Other | Source: Ambulatory Visit | Attending: Orthopaedic Surgery | Admitting: Orthopaedic Surgery

## 2019-10-17 ENCOUNTER — Other Ambulatory Visit: Payer: Self-pay

## 2019-10-17 ENCOUNTER — Encounter (HOSPITAL_COMMUNITY)
Admission: RE | Admit: 2019-10-17 | Discharge: 2019-10-17 | Disposition: A | Discharge status: Home / Self Care | Payer: Medicare Other | Source: Ambulatory Visit | Attending: Orthopaedic Surgery | Admitting: Orthopaedic Surgery

## 2019-10-17 DIAGNOSIS — I509 Heart failure, unspecified: Secondary | ICD-10-CM | POA: Insufficient documentation

## 2019-10-17 DIAGNOSIS — K219 Gastro-esophageal reflux disease without esophagitis: Secondary | ICD-10-CM | POA: Insufficient documentation

## 2019-10-17 DIAGNOSIS — F419 Anxiety disorder, unspecified: Secondary | ICD-10-CM | POA: Insufficient documentation

## 2019-10-17 DIAGNOSIS — Z01818 Encounter for other preprocedural examination: Secondary | ICD-10-CM

## 2019-10-17 DIAGNOSIS — Z7901 Long term (current) use of anticoagulants: Secondary | ICD-10-CM | POA: Diagnosis not present

## 2019-10-17 DIAGNOSIS — Z79899 Other long term (current) drug therapy: Secondary | ICD-10-CM | POA: Insufficient documentation

## 2019-10-17 DIAGNOSIS — F329 Major depressive disorder, single episode, unspecified: Secondary | ICD-10-CM | POA: Diagnosis not present

## 2019-10-17 DIAGNOSIS — I700 Atherosclerosis of aorta: Secondary | ICD-10-CM | POA: Diagnosis not present

## 2019-10-17 DIAGNOSIS — I4819 Other persistent atrial fibrillation: Secondary | ICD-10-CM | POA: Diagnosis not present

## 2019-10-17 DIAGNOSIS — E785 Hyperlipidemia, unspecified: Secondary | ICD-10-CM | POA: Diagnosis not present

## 2019-10-17 DIAGNOSIS — I110 Hypertensive heart disease with heart failure: Secondary | ICD-10-CM | POA: Insufficient documentation

## 2019-10-17 DIAGNOSIS — Z87891 Personal history of nicotine dependence: Secondary | ICD-10-CM | POA: Diagnosis not present

## 2019-10-17 DIAGNOSIS — Z20822 Contact with and (suspected) exposure to covid-19: Secondary | ICD-10-CM | POA: Insufficient documentation

## 2019-10-17 DIAGNOSIS — M1611 Unilateral primary osteoarthritis, right hip: Secondary | ICD-10-CM | POA: Diagnosis not present

## 2019-10-17 LAB — CBC WITH DIFFERENTIAL/PLATELET
Abs Immature Granulocytes: 0.02 10*3/uL (ref 0.00–0.07)
Basophils Absolute: 0 10*3/uL (ref 0.0–0.1)
Basophils Relative: 1 %
Eosinophils Absolute: 0.1 10*3/uL (ref 0.0–0.5)
Eosinophils Relative: 2 %
HCT: 47.7 % (ref 39.0–52.0)
Hemoglobin: 15.5 g/dL (ref 13.0–17.0)
Immature Granulocytes: 0 %
Lymphocytes Relative: 30 %
Lymphs Abs: 1.6 10*3/uL (ref 0.7–4.0)
MCH: 31.7 pg (ref 26.0–34.0)
MCHC: 32.5 g/dL (ref 30.0–36.0)
MCV: 97.5 fL (ref 80.0–100.0)
Monocytes Absolute: 0.5 10*3/uL (ref 0.1–1.0)
Monocytes Relative: 10 %
Neutro Abs: 3.1 10*3/uL (ref 1.7–7.7)
Neutrophils Relative %: 57 %
Platelets: 168 10*3/uL (ref 150–400)
RBC: 4.89 MIL/uL (ref 4.22–5.81)
RDW: 14.2 % (ref 11.5–15.5)
WBC: 5.3 10*3/uL (ref 4.0–10.5)
nRBC: 0 % (ref 0.0–0.2)

## 2019-10-17 LAB — BASIC METABOLIC PANEL
Anion gap: 8 (ref 5–15)
BUN: 20 mg/dL (ref 8–23)
CO2: 29 mmol/L (ref 22–32)
Calcium: 9.7 mg/dL (ref 8.9–10.3)
Chloride: 104 mmol/L (ref 98–111)
Creatinine, Ser: 0.74 mg/dL (ref 0.61–1.24)
GFR calc Af Amer: >60 mL/min (ref >60)
GFR calc non Af Amer: >60 mL/min (ref >60)
Glucose, Bld: 93 mg/dL (ref 70–99)
Potassium: 4.2 mmol/L (ref 3.5–5.1)
Sodium: 141 mmol/L (ref 135–145)

## 2019-10-17 LAB — URINALYSIS, ROUTINE W REFLEX MICROSCOPIC
Bilirubin Urine: NEGATIVE
Glucose, UA: NEGATIVE mg/dL
Hgb urine dipstick: NEGATIVE
Ketones, ur: NEGATIVE mg/dL
Leukocytes,Ua: NEGATIVE
Nitrite: NEGATIVE
Protein, ur: NEGATIVE mg/dL
Specific Gravity, Urine: 1.014 (ref 1.005–1.030)
pH: 5 (ref 5.0–8.0)

## 2019-10-17 LAB — PROTIME-INR
INR: 1.5 — ABNORMAL HIGH (ref 0.8–1.2)
Prothrombin Time: 17.2 seconds — ABNORMAL HIGH (ref 11.4–15.2)

## 2019-10-17 LAB — SURGICAL PCR SCREEN
MRSA, PCR: NEGATIVE
Staphylococcus aureus: NEGATIVE

## 2019-10-17 LAB — SARS CORONAVIRUS 2 (TAT 6-24 HRS): SARS Coronavirus 2: NEGATIVE

## 2019-10-17 LAB — APTT: aPTT: 36 seconds (ref 24–36)

## 2019-10-19 ENCOUNTER — Other Ambulatory Visit: Payer: Self-pay | Admitting: Family Medicine

## 2019-10-20 MED ORDER — DEXTROSE 5 % IV SOLN
3.0000 g | INTRAVENOUS | Status: AC
  Administered 2019-10-21: 3 g via INTRAVENOUS
  Filled 2019-10-20: qty 3

## 2019-10-20 MED ORDER — BUPIVACAINE LIPOSOME 1.3 % IJ SUSP
10.0000 mL | Freq: Once | INTRAMUSCULAR | Status: DC
  Filled 2019-10-20: qty 10

## 2019-10-20 NOTE — Progress Notes (Signed)
Anesthesia Chart Review   Case: [id number] Date/Time: 10/21/19 1459   Procedure: RIGHT TOTAL HIP ARTHROPLASTY ANTERIOR APPROACH (Right Hip)   Anesthesia type: Spinal   Pre-op diagnosis: RIGHT HIP DEGENERATIVE JOINT DISEASE   Location: [facility] ROOM 06 / [facility] ORS   Surgeons: Melrose Nakayama, MD      DISCUSSION:69 y.o. former smoker (quit 07/04/01) with h/o GERD, PONV, CHF, a-fib (on Xarelto), HTN, OSA on CPAP, right hip DJD scheduled for above procedure 10/21/2019 with Dr. Melrose Nakayama.   Pt last seen by cardiologist 10/02/2019.  Per OV note, "-Patient is easily getting greater than 4 METS of activity.  No symptoms concerning for angina or CHF. - Given past medical history and time since last visit, based on ACC/AHA guidelines, Isaiah Jones would be at acceptable risk for the planned procedure without further cardiovascular testing."  Pt advised to hold Xarelto 3 days prior to surgery.   Anticipate pt can proceed with planned procedure barring acute status change.   VS: BP 130/74   Pulse 81   Temp 36.8 C (Oral)   Resp 16   Ht 6\' 2"  (1.88 m)   Wt 131.5 kg   SpO2 100%   BMI 37.23 kg/m   PROVIDERS: Vivi Barrack, MD is PCP   Larae Grooms, MD is Cardiologist  LABS: Labs reviewed: Acceptable for surgery. (all labs ordered are listed, but only abnormal results are displayed)  Labs Reviewed  PROTIME-INR - Abnormal; Notable for the following components:      Result Value   Prothrombin Time 17.2 (*)    INR 1.5 (*)    All other components within normal limits  SURGICAL PCR SCREEN  APTT  BASIC METABOLIC PANEL  CBC WITH DIFFERENTIAL/PLATELET  URINALYSIS, ROUTINE W REFLEX MICROSCOPIC  TYPE AND SCREEN     IMAGES:   EKG: 10/02/2019 Rate 65 bpm    CV: Echo 07/11/2016 Study Conclusions   - Left ventricle: The cavity size was normal. Wall thickness was  increased in a pattern of mild LVH. Systolic function was normal.  The estimated ejection fraction was in the range of  55% to 60%.  Wall motion was normal; there were no regional wall motion  abnormalities. Doppler parameters are consistent with abnormal  left ventricular relaxation (grade 1 diastolic dysfunction).  - Left atrium: The atrium was mildly dilated. Volume/bsa, S: 37.4  ml/m^2.  Past Medical History:  Diagnosis Date  . Anxiety   . Arthritis, degenerative    hip,shoulders, hands, back  . 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 CHF (congestive heart failure) ([place])   . Colon polyps   . Complication of anesthesia   . Depression   . Dyslipidemia   . Dysrhythmia 2017   A fib  . Family history of adverse reaction to anesthesia    unaware  . GERD (gastroesophageal reflux disease)   . Glaucoma    early stage  . History of 2019 novel coronavirus disease (COVID-19) 06/2019  . History of kidney stones   . Hypertension   . OSA on CPAP    C-Pap  . Persistent atrial fibrillation ([place])    Echo 3/18: Mild LVH, EF 55-60, normal wall motion, grade 1 diastolic dysfunction, mild LAE  . Pneumonia    childhood  . PONV (postoperative nausea and vomiting)    after 1st knee surgery only  . Rhinitis, allergic     Past Surgical History:  Procedure Laterality Date  . arthroscopic knee surgery Bilateral 2003  .  CARDIOVERSION N/A 08/10/2016   Procedure: CARDIOVERSION;  Surgeon: Pixie Casino, MD;  Location: [facility] ENDOSCOPY;  Service: Cardiovascular;  Laterality: N/A;  . COLONOSCOPY    . LAPAROSCOPIC GASTRIC SLEEVE RESECTION N/A 07/15/2019   Procedure: LAPAROSCOPIC GASTRIC SLEEVE RESECTION, Upper Endo, ERAS Pathway;  Surgeon: [name], MD;  Location: [facility] ORS;  Service: General;  Laterality: N/A;  . REPLACEMENT TOTAL KNEE Bilateral 2008  . TONSILLECTOMY    . VASECTOMY      MEDICATIONS: . acetaminophen (TYLENOL) 500 MG tablet  . amLODipine (NORVASC) 5 MG tablet  . amoxicillin (AMOXIL) 500 MG capsule  . azelastine (ASTELIN) 0.1 % nasal spray  . Calcium  Carb-Cholecalciferol (CALCIUM-VITAMIN D) 600-400 MG-UNIT TABS  . diphenhydramine-acetaminophen (TYLENOL PM) 25-500 MG TABS tablet  . fexofenadine (ALLEGRA) 180 MG tablet  . Ginkgo Biloba 60 MG TABS  . latanoprost (XALATAN) 0.005 % ophthalmic solution  . methocarbamol (ROBAXIN) 750 MG tablet  . metoprolol succinate (TOPROL-XL) 50 MG 24 hr tablet  . mometasone (NASONEX) 50 MCG/ACT nasal spray  . Multiple Vitamin (MULTIVITAMIN WITH MINERALS) TABS tablet  . Multiple Vitamins-Minerals (AIRBORNE PO)  . oxyCODONE (OXY IR/ROXICODONE) 5 MG immediate release tablet  . pantoprazole (PROTONIX) 40 MG tablet  . PENNSAID 2 % SOLN  . rivaroxaban (XARELTO) 20 MG TABS tablet  . rosuvastatin (CRESTOR) 20 MG tablet  . sertraline (ZOLOFT) 50 MG tablet  . tadalafil (CIALIS) 10 MG tablet  . valsartan (DIOVAN) 160 MG tablet   No current facility-administered medications for this encounter.   Derrill Memo ON 10/21/2019] bupivacaine liposome (EXPAREL) 1.3 % injection 133 mg  . [START ON 10/21/2019] ceFAZolin (ANCEF) 3 g in dextrose 5 % 50 mL IVPB     [name] [facility] Pre-Surgical Testing [phone] 10/20/19  10:43 AM

## 2019-10-20 NOTE — Anesthesia Preprocedure Evaluation (Addendum)
Anesthesia Evaluation  Patient identified by MRN, date of birth, ID band Patient awake    Reviewed: Allergy & Precautions, NPO status , Patient's Chart, lab work & pertinent test results, reviewed documented beta blocker date and time   History of Anesthesia Complications (+) PONV  Airway Mallampati: I  TM Distance: >3 FB Neck ROM: Full    Dental  (+) Dental Advisory Given, Missing   Pulmonary sleep apnea and Continuous Positive Airway Pressure Ventilation , former smoker,  10/17/2019 SARS coronavirus NEG   breath sounds clear to auscultation       Cardiovascular hypertension, Pt. on medications and Pt. on home beta blockers (-) angina+ dysrhythmias Atrial Fibrillation  Rhythm:Irregular Rate:Normal  '18 ECHO: EF 55-60%, valves OK   Neuro/Psych Anxiety Depression glaucoma negative neurological ROS     GI/Hepatic Neg liver ROS, GERD  Medicated and Controlled,S/p gastric sleeve   Endo/Other  Morbid obesity  Renal/GU stones     Musculoskeletal  (+) Arthritis ,   Abdominal (+) + obese,   Peds  Hematology Xarelto: last dose 10/16/2019   Anesthesia Other Findings   Reproductive/Obstetrics                           Anesthesia Physical Anesthesia Plan  ASA: III  Anesthesia Plan: Spinal   Post-op Pain Management:    Induction:   PONV Risk Score and Plan: 2 and Treatment may vary due to age or medical condition  Airway Management Planned: Natural Airway and Simple Face Mask  Additional Equipment:   Intra-op Plan:   Post-operative Plan:   Informed Consent: I have reviewed the patients History and Physical, chart, labs and discussed the procedure including the risks, benefits and alternatives for the proposed anesthesia with the patient or authorized representative who has indicated his/her understanding and acceptance.     Dental advisory given  Plan Discussed with: CRNA and  Surgeon  Anesthesia Plan Comments: (See PAT note, Konrad Felix, PA-C)      Anesthesia Quick Evaluation

## 2019-10-21 ENCOUNTER — Ambulatory Visit (HOSPITAL_COMMUNITY): Payer: Medicare Other | Admitting: Physician Assistant

## 2019-10-21 ENCOUNTER — Encounter (HOSPITAL_COMMUNITY): Payer: Self-pay | Admitting: Orthopaedic Surgery

## 2019-10-21 ENCOUNTER — Ambulatory Visit (HOSPITAL_COMMUNITY): Payer: Medicare Other

## 2019-10-21 ENCOUNTER — Observation Stay (HOSPITAL_COMMUNITY)
Admission: AD | Admit: 2019-10-21 | Discharge: 2019-10-22 | Disposition: A | Discharge status: Home / Self Care | Payer: Medicare Other | Attending: Orthopaedic Surgery | Admitting: Orthopaedic Surgery

## 2019-10-21 ENCOUNTER — Ambulatory Visit (HOSPITAL_COMMUNITY): Payer: Medicare Other | Admitting: Anesthesiology

## 2019-10-21 ENCOUNTER — Encounter (HOSPITAL_COMMUNITY)
Admission: AD | Disposition: A | Discharge status: Home / Self Care | Payer: Self-pay | Source: Home / Self Care | Attending: Orthopaedic Surgery

## 2019-10-21 ENCOUNTER — Other Ambulatory Visit: Payer: Self-pay

## 2019-10-21 DIAGNOSIS — N529 Male erectile dysfunction, unspecified: Secondary | ICD-10-CM | POA: Diagnosis not present

## 2019-10-21 DIAGNOSIS — E559 Vitamin D deficiency, unspecified: Secondary | ICD-10-CM | POA: Insufficient documentation

## 2019-10-21 DIAGNOSIS — Z6837 Body mass index (BMI) 37.0-37.9, adult: Secondary | ICD-10-CM | POA: Diagnosis not present

## 2019-10-21 DIAGNOSIS — Z87891 Personal history of nicotine dependence: Secondary | ICD-10-CM | POA: Diagnosis not present

## 2019-10-21 DIAGNOSIS — Z9884 Bariatric surgery status: Secondary | ICD-10-CM | POA: Diagnosis not present

## 2019-10-21 DIAGNOSIS — H409 Unspecified glaucoma: Secondary | ICD-10-CM | POA: Insufficient documentation

## 2019-10-21 DIAGNOSIS — J309 Allergic rhinitis, unspecified: Secondary | ICD-10-CM | POA: Diagnosis not present

## 2019-10-21 DIAGNOSIS — Z7901 Long term (current) use of anticoagulants: Secondary | ICD-10-CM | POA: Insufficient documentation

## 2019-10-21 DIAGNOSIS — K219 Gastro-esophageal reflux disease without esophagitis: Secondary | ICD-10-CM | POA: Diagnosis not present

## 2019-10-21 DIAGNOSIS — G4733 Obstructive sleep apnea (adult) (pediatric): Secondary | ICD-10-CM | POA: Insufficient documentation

## 2019-10-21 DIAGNOSIS — F418 Other specified anxiety disorders: Secondary | ICD-10-CM | POA: Diagnosis not present

## 2019-10-21 DIAGNOSIS — E785 Hyperlipidemia, unspecified: Secondary | ICD-10-CM | POA: Diagnosis not present

## 2019-10-21 DIAGNOSIS — I10 Essential (primary) hypertension: Secondary | ICD-10-CM | POA: Diagnosis not present

## 2019-10-21 DIAGNOSIS — Z8616 Personal history of COVID-19: Secondary | ICD-10-CM | POA: Diagnosis not present

## 2019-10-21 DIAGNOSIS — Z96653 Presence of artificial knee joint, bilateral: Secondary | ICD-10-CM | POA: Insufficient documentation

## 2019-10-21 DIAGNOSIS — I119 Hypertensive heart disease without heart failure: Secondary | ICD-10-CM | POA: Diagnosis not present

## 2019-10-21 DIAGNOSIS — Z79899 Other long term (current) drug therapy: Secondary | ICD-10-CM | POA: Insufficient documentation

## 2019-10-21 DIAGNOSIS — M1611 Unilateral primary osteoarthritis, right hip: Secondary | ICD-10-CM | POA: Diagnosis not present

## 2019-10-21 DIAGNOSIS — I4819 Other persistent atrial fibrillation: Secondary | ICD-10-CM | POA: Insufficient documentation

## 2019-10-21 DIAGNOSIS — Z419 Encounter for procedure for purposes other than remedying health state, unspecified: Secondary | ICD-10-CM

## 2019-10-21 HISTORY — PX: TOTAL HIP ARTHROPLASTY: SHX124

## 2019-10-21 LAB — TYPE AND SCREEN
ABO/RH(D): O NEG
Antibody Screen: NEGATIVE

## 2019-10-21 SURGERY — ARTHROPLASTY, HIP, TOTAL, ANTERIOR APPROACH
Anesthesia: Spinal | Site: Hip | Laterality: Right

## 2019-10-21 MED ORDER — CEFAZOLIN SODIUM-DEXTROSE 2-4 GM/100ML-% IV SOLN
2.0000 g | Freq: Four times a day (QID) | INTRAVENOUS | Status: AC
  Administered 2019-10-21 – 2019-10-22 (×2): 2 g via INTRAVENOUS
  Filled 2019-10-21 (×2): qty 100

## 2019-10-21 MED ORDER — HYDROCODONE-ACETAMINOPHEN 7.5-325 MG PO TABS
1.0000 | ORAL_TABLET | ORAL | Status: DC | PRN
  Administered 2019-10-21: 1 via ORAL
  Filled 2019-10-21: qty 1

## 2019-10-21 MED ORDER — ACETAMINOPHEN 325 MG PO TABS: 325.0000 mg | ORAL_TABLET | Freq: Four times a day (QID) | ORAL | Status: DC | PRN

## 2019-10-21 MED ORDER — MIDAZOLAM HCL 2 MG/2ML IJ SOLN: 0.5000 mg | Freq: Once | INTRAMUSCULAR | Status: DC | PRN

## 2019-10-21 MED ORDER — HYDROMORPHONE HCL 1 MG/ML IJ SOLN: 0.2500 mg | INTRAMUSCULAR | Status: DC | PRN

## 2019-10-21 MED ORDER — METOCLOPRAMIDE HCL 5 MG/ML IJ SOLN: 5.0000 mg | Freq: Three times a day (TID) | INTRAMUSCULAR | Status: DC | PRN

## 2019-10-21 MED ORDER — POVIDONE-IODINE 10 % EX SWAB
2.0000 "application " | Freq: Once | CUTANEOUS | Status: AC
  Administered 2019-10-21: 2 via TOPICAL

## 2019-10-21 MED ORDER — LACTATED RINGERS IV SOLN: INTRAVENOUS | Status: DC

## 2019-10-21 MED ORDER — MENTHOL 3 MG MT LOZG: 1.0000 | LOZENGE | OROMUCOSAL | Status: DC | PRN

## 2019-10-21 MED ORDER — DEXAMETHASONE SODIUM PHOSPHATE 10 MG/ML IJ SOLN
INTRAMUSCULAR | Status: DC | PRN
Start: 2019-10-21 — End: 2019-10-21
  Administered 2019-10-21: 5 mg via INTRAVENOUS

## 2019-10-21 MED ORDER — TRANEXAMIC ACID-NACL 1000-0.7 MG/100ML-% IV SOLN
INTRAVENOUS | Status: AC
  Filled 2019-10-21: qty 100

## 2019-10-21 MED ORDER — ORAL CARE MOUTH RINSE: 15.0000 mL | Freq: Once | OROMUCOSAL | Status: AC

## 2019-10-21 MED ORDER — DIPHENHYDRAMINE HCL 12.5 MG/5ML PO ELIX: 12.5000 mg | ORAL_SOLUTION | ORAL | Status: DC | PRN

## 2019-10-21 MED ORDER — PROPOFOL 500 MG/50ML IV EMUL
INTRAVENOUS | Status: DC | PRN
  Administered 2019-10-21: 75 ug/kg/min via INTRAVENOUS

## 2019-10-21 MED ORDER — DOCUSATE SODIUM 100 MG PO CAPS
100.0000 mg | ORAL_CAPSULE | Freq: Two times a day (BID) | ORAL | Status: DC
  Administered 2019-10-21 – 2019-10-22 (×2): 100 mg via ORAL
  Filled 2019-10-21 (×2): qty 1

## 2019-10-21 MED ORDER — BUPIVACAINE-EPINEPHRINE 0.5% -1:200000 IJ SOLN
INTRAMUSCULAR | Status: AC
  Filled 2019-10-21: qty 1

## 2019-10-21 MED ORDER — CHLORHEXIDINE GLUCONATE 0.12 % MT SOLN
15.0000 mL | Freq: Once | OROMUCOSAL | Status: AC
  Administered 2019-10-21: 15 mL via OROMUCOSAL

## 2019-10-21 MED ORDER — BISACODYL 5 MG PO TBEC: 5.0000 mg | DELAYED_RELEASE_TABLET | Freq: Every day | ORAL | Status: DC | PRN

## 2019-10-21 MED ORDER — PROPOFOL 500 MG/50ML IV EMUL
INTRAVENOUS | Status: AC
  Filled 2019-10-21: qty 50

## 2019-10-21 MED ORDER — TRANEXAMIC ACID 1000 MG/10ML IV SOLN
2000.0000 mg | INTRAVENOUS | Status: DC
  Filled 2019-10-21: qty 20

## 2019-10-21 MED ORDER — METOCLOPRAMIDE HCL 5 MG PO TABS: 5.0000 mg | ORAL_TABLET | Freq: Three times a day (TID) | ORAL | Status: DC | PRN

## 2019-10-21 MED ORDER — ACETAMINOPHEN 500 MG PO TABS
1000.0000 mg | ORAL_TABLET | Freq: Once | ORAL | Status: AC
  Administered 2019-10-21: 1000 mg via ORAL
  Filled 2019-10-21: qty 2

## 2019-10-21 MED ORDER — OXYCODONE HCL 5 MG/5ML PO SOLN: 5.0000 mg | Freq: Once | ORAL | Status: DC | PRN

## 2019-10-21 MED ORDER — SERTRALINE HCL 50 MG PO TABS
50.0000 mg | ORAL_TABLET | Freq: Every day | ORAL | Status: DC
  Administered 2019-10-21 – 2019-10-22 (×2): 50 mg via ORAL
  Filled 2019-10-21 (×2): qty 1

## 2019-10-21 MED ORDER — RIVAROXABAN 10 MG PO TABS: 20.0000 mg | ORAL_TABLET | Freq: Every day | ORAL | Status: DC

## 2019-10-21 MED ORDER — TRANEXAMIC ACID 1000 MG/10ML IV SOLN
INTRAVENOUS | Status: DC | PRN
  Administered 2019-10-21: 2000 mg via TOPICAL

## 2019-10-21 MED ORDER — BUPIVACAINE-EPINEPHRINE (PF) 0.5% -1:200000 IJ SOLN
INTRAMUSCULAR | Status: DC | PRN
  Administered 2019-10-21: 30 mL

## 2019-10-21 MED ORDER — ALBUMIN HUMAN 5 % IV SOLN
INTRAVENOUS | Status: AC
  Filled 2019-10-21: qty 250

## 2019-10-21 MED ORDER — TRANEXAMIC ACID-NACL 1000-0.7 MG/100ML-% IV SOLN
1000.0000 mg | Freq: Once | INTRAVENOUS | Status: AC
  Administered 2019-10-21: 1000 mg via INTRAVENOUS
  Filled 2019-10-21: qty 100

## 2019-10-21 MED ORDER — AZELASTINE HCL 0.1 % NA SOLN
2.0000 | Freq: Two times a day (BID) | NASAL | Status: DC
  Filled 2019-10-21: qty 30

## 2019-10-21 MED ORDER — FENTANYL CITRATE (PF) 100 MCG/2ML IJ SOLN
INTRAMUSCULAR | Status: DC | PRN
  Administered 2019-10-21 (×2): 50 ug via INTRAVENOUS

## 2019-10-21 MED ORDER — PROMETHAZINE HCL 25 MG/ML IJ SOLN: 6.2500 mg | INTRAMUSCULAR | Status: DC | PRN

## 2019-10-21 MED ORDER — LORATADINE 10 MG PO TABS
10.0000 mg | ORAL_TABLET | Freq: Every day | ORAL | Status: DC
  Administered 2019-10-21 – 2019-10-22 (×2): 10 mg via ORAL
  Filled 2019-10-21 (×2): qty 1

## 2019-10-21 MED ORDER — IRBESARTAN 150 MG PO TABS
150.0000 mg | ORAL_TABLET | Freq: Every day | ORAL | Status: DC
  Administered 2019-10-22: 150 mg via ORAL
  Filled 2019-10-21: qty 1

## 2019-10-21 MED ORDER — HYDROCODONE-ACETAMINOPHEN 5-325 MG PO TABS
1.0000 | ORAL_TABLET | ORAL | Status: DC | PRN
  Administered 2019-10-22 (×2): 2 via ORAL
  Filled 2019-10-21 (×2): qty 2

## 2019-10-21 MED ORDER — METHOCARBAMOL 500 MG PO TABS
750.0000 mg | ORAL_TABLET | Freq: Four times a day (QID) | ORAL | Status: DC | PRN
  Administered 2019-10-22: 750 mg via ORAL
  Filled 2019-10-21: qty 2

## 2019-10-21 MED ORDER — PANTOPRAZOLE SODIUM 40 MG PO TBEC
40.0000 mg | DELAYED_RELEASE_TABLET | Freq: Every day | ORAL | Status: DC
  Administered 2019-10-22: 40 mg via ORAL
  Filled 2019-10-21: qty 1

## 2019-10-21 MED ORDER — PROPOFOL 1000 MG/100ML IV EMUL
INTRAVENOUS | Status: AC
  Filled 2019-10-21: qty 100

## 2019-10-21 MED ORDER — PHENOL 1.4 % MT LIQD: 1.0000 | OROMUCOSAL | Status: DC | PRN

## 2019-10-21 MED ORDER — ACETAMINOPHEN 500 MG PO TABS
500.0000 mg | ORAL_TABLET | Freq: Four times a day (QID) | ORAL | Status: DC
  Administered 2019-10-21 – 2019-10-22 (×2): 500 mg via ORAL
  Filled 2019-10-21 (×2): qty 1

## 2019-10-21 MED ORDER — MEPERIDINE HCL 50 MG/ML IJ SOLN: 6.2500 mg | INTRAMUSCULAR | Status: DC | PRN

## 2019-10-21 MED ORDER — PHENYLEPHRINE HCL (PRESSORS) 10 MG/ML IV SOLN
INTRAVENOUS | Status: DC | PRN
  Administered 2019-10-21 (×5): 80 ug via INTRAVENOUS

## 2019-10-21 MED ORDER — STERILE WATER FOR IRRIGATION IR SOLN
Status: DC | PRN
  Administered 2019-10-21: 2000 mL

## 2019-10-21 MED ORDER — LATANOPROST 0.005 % OP SOLN
1.0000 [drp] | Freq: Every day | OPHTHALMIC | Status: DC
  Administered 2019-10-21: 1 [drp] via OPHTHALMIC
  Filled 2019-10-21: qty 2.5

## 2019-10-21 MED ORDER — OXYCODONE HCL 5 MG PO TABS: 5.0000 mg | ORAL_TABLET | Freq: Once | ORAL | Status: DC | PRN

## 2019-10-21 MED ORDER — METOPROLOL SUCCINATE ER 50 MG PO TB24
50.0000 mg | ORAL_TABLET | Freq: Every day | ORAL | Status: DC
  Administered 2019-10-21 – 2019-10-22 (×2): 50 mg via ORAL
  Filled 2019-10-21 (×2): qty 1

## 2019-10-21 MED ORDER — ONDANSETRON HCL 4 MG/2ML IJ SOLN: 4.0000 mg | Freq: Four times a day (QID) | INTRAMUSCULAR | Status: DC | PRN

## 2019-10-21 MED ORDER — MORPHINE SULFATE (PF) 2 MG/ML IV SOLN: 0.5000 mg | INTRAVENOUS | Status: DC | PRN

## 2019-10-21 MED ORDER — 0.9 % SODIUM CHLORIDE (POUR BTL) OPTIME
TOPICAL | Status: DC | PRN
  Administered 2019-10-21: 1000 mL

## 2019-10-21 MED ORDER — FENTANYL CITRATE (PF) 100 MCG/2ML IJ SOLN
INTRAMUSCULAR | Status: AC
  Filled 2019-10-21: qty 2

## 2019-10-21 MED ORDER — METHOCARBAMOL 500 MG IVPB - SIMPLE MED
500.0000 mg | Freq: Four times a day (QID) | INTRAVENOUS | Status: DC | PRN
  Filled 2019-10-21: qty 50

## 2019-10-21 MED ORDER — BUPIVACAINE LIPOSOME 1.3 % IJ SUSP
INTRAMUSCULAR | Status: DC | PRN
  Administered 2019-10-21: 10 mL

## 2019-10-21 MED ORDER — FLUTICASONE PROPIONATE 50 MCG/ACT NA SUSP
1.0000 | Freq: Every day | NASAL | Status: DC
  Administered 2019-10-21: 1 via NASAL
  Filled 2019-10-21: qty 16

## 2019-10-21 MED ORDER — ROSUVASTATIN CALCIUM 20 MG PO TABS
20.0000 mg | ORAL_TABLET | Freq: Every day | ORAL | Status: DC
  Administered 2019-10-22: 20 mg via ORAL
  Filled 2019-10-21: qty 1

## 2019-10-21 MED ORDER — ONDANSETRON HCL 4 MG/2ML IJ SOLN
INTRAMUSCULAR | Status: DC | PRN
  Administered 2019-10-21: 4 mg via INTRAVENOUS

## 2019-10-21 MED ORDER — ONDANSETRON HCL 4 MG PO TABS: 4.0000 mg | ORAL_TABLET | Freq: Four times a day (QID) | ORAL | Status: DC | PRN

## 2019-10-21 MED ORDER — AMLODIPINE BESYLATE 5 MG PO TABS
5.0000 mg | ORAL_TABLET | Freq: Every day | ORAL | Status: DC
  Administered 2019-10-22: 5 mg via ORAL
  Filled 2019-10-21: qty 1

## 2019-10-21 MED ORDER — TRANEXAMIC ACID-NACL 1000-0.7 MG/100ML-% IV SOLN
1000.0000 mg | INTRAVENOUS | Status: AC
  Administered 2019-10-21: 1000 mg via INTRAVENOUS

## 2019-10-21 MED ORDER — KETOROLAC TROMETHAMINE 15 MG/ML IJ SOLN
7.5000 mg | Freq: Four times a day (QID) | INTRAMUSCULAR | Status: DC
  Administered 2019-10-21 – 2019-10-22 (×3): 7.5 mg via INTRAVENOUS
  Filled 2019-10-21 (×3): qty 1

## 2019-10-21 MED ORDER — ALBUMIN HUMAN 5 % IV SOLN
INTRAVENOUS | Status: DC | PRN
Start: 2019-10-21 — End: 2019-10-21

## 2019-10-21 MED ORDER — ALUM & MAG HYDROXIDE-SIMETH 200-200-20 MG/5ML PO SUSP: 30.0000 mL | ORAL | Status: DC | PRN

## 2019-10-21 SURGICAL SUPPLY — 42 items
BAG DECANTER FOR FLEXI CONT (MISCELLANEOUS) ×2 IMPLANT
BLADE SAW SGTL 18X1.27X75 (BLADE) ×2 IMPLANT
BOOTIES KNEE HIGH SLOAN (MISCELLANEOUS) ×2 IMPLANT
CELLS DAT CNTRL 66122 CELL SVR (MISCELLANEOUS) ×1 IMPLANT
COVER PERINEAL POST (MISCELLANEOUS) ×2 IMPLANT
COVER SURGICAL LIGHT HANDLE (MISCELLANEOUS) ×2 IMPLANT
COVER WAND RF STERILE (DRAPES) IMPLANT
CUP PINN GRIPTON 58 100 (Cup) ×2 IMPLANT
DECANTER SPIKE VIAL GLASS SM (MISCELLANEOUS) ×2 IMPLANT
DRAPE IMP U-DRAPE 54X76 (DRAPES) ×2 IMPLANT
DRAPE STERI IOBAN 125X83 (DRAPES) ×2 IMPLANT
DRAPE U-SHAPE 47X51 STRL (DRAPES) ×4 IMPLANT
DRSG AQUACEL AG ADV 3.5X 6 (GAUZE/BANDAGES/DRESSINGS) ×2 IMPLANT
DURAPREP 26ML APPLICATOR (WOUND CARE) ×2 IMPLANT
ELECT BLADE TIP CTD 4 INCH (ELECTRODE) ×2 IMPLANT
ELECT REM PT RETURN 15FT ADLT (MISCELLANEOUS) ×2 IMPLANT
ELIMINATOR HOLE APEX DEPUY (Hips) ×2 IMPLANT
GLOVE BIO SURGEON STRL SZ8 (GLOVE) ×4 IMPLANT
GLOVE BIOGEL PI IND STRL 8 (GLOVE) ×2 IMPLANT
GLOVE BIOGEL PI INDICATOR 8 (GLOVE) ×2
GOWN STRL REUS W/TWL XL LVL3 (GOWN DISPOSABLE) ×4 IMPLANT
HEAD CERAMIC DELTA 36 PLUS 1.5 (Hips) ×2 IMPLANT
HOLDER FOLEY CATH W/STRAP (MISCELLANEOUS) ×2 IMPLANT
KIT TURNOVER KIT A (KITS) IMPLANT
LINER NEUTRAL 36X58 PLUS4 ×2 IMPLANT
MANIFOLD NEPTUNE II (INSTRUMENTS) ×2 IMPLANT
NEEDLE HYPO 22GX1.5 SAFETY (NEEDLE) ×2 IMPLANT
NS IRRIG 1000ML POUR BTL (IV SOLUTION) ×2 IMPLANT
PACK ANTERIOR HIP CUSTOM (KITS) ×2 IMPLANT
PENCIL SMOKE EVACUATOR (MISCELLANEOUS) IMPLANT
PROTECTOR NERVE ULNAR (MISCELLANEOUS) ×2 IMPLANT
RTRCTR WOUND ALEXIS 18CM MED (MISCELLANEOUS) ×2
STEM FEMORAL SZ9 HIGH ACTIS (Stem) ×2 IMPLANT
SUT ETHIBOND NAB CT1 #1 30IN (SUTURE) ×4 IMPLANT
SUT VIC AB 1 CT1 36 (SUTURE) ×2 IMPLANT
SUT VIC AB 2-0 CT1 27 (SUTURE) ×1
SUT VIC AB 2-0 CT1 TAPERPNT 27 (SUTURE) ×1 IMPLANT
SUT VICRYL AB 3-0 FS1 BRD 27IN (SUTURE) ×2 IMPLANT
SUT VLOC 180 0 24IN GS25 (SUTURE) ×2 IMPLANT
SYR 50ML LL SCALE MARK (SYRINGE) ×2 IMPLANT
TRAY FOLEY MTR SLVR 16FR STAT (SET/KITS/TRAYS/PACK) ×2 IMPLANT
YANKAUER SUCT BULB TIP 10FT TU (MISCELLANEOUS) ×2 IMPLANT

## 2019-10-21 NOTE — Interval H&P Note (Signed)
History and Physical Interval Note:  10/21/2019 2:19 PM  Isaiah Jones  has presented today for surgery, with the diagnosis of RIGHT HIP DEGENERATIVE JOINT DISEASE.  The various methods of treatment have been discussed with the patient and family. After consideration of risks, benefits and other options for treatment, the patient has consented to  Procedure(s): RIGHT TOTAL HIP ARTHROPLASTY ANTERIOR APPROACH (Right) as a surgical intervention.  The patient's history has been reviewed, patient examined, no change in status, stable for surgery.  I have reviewed the patient's chart and labs.  Questions were answered to the patient's satisfaction.     Hessie Dibble

## 2019-10-21 NOTE — Transfer of Care (Signed)
Immediate Anesthesia Transfer of Care Note  Patient: ODAS OZER  Procedure(s) Performed: RIGHT TOTAL HIP ARTHROPLASTY ANTERIOR APPROACH (Right Hip)  Patient Location: PACU  Anesthesia Type:Spinal  Level of Consciousness: sedated, patient cooperative and responds to stimulation  Airway & Oxygen Therapy: Patient Spontanous Breathing and Patient connected to face mask oxygen  Post-op Assessment: Report given to RN and Post -op Vital signs reviewed and stable  Post vital signs: Reviewed and stable  Last Vitals:  Vitals Value Taken Time  BP    Temp    Pulse 83 10/21/19 1717  Resp 14 10/21/19 1717  SpO2 98 % 10/21/19 1717  Vitals shown include unvalidated device data.  Last Pain:  Vitals:   10/21/19 1321  TempSrc:   PainSc: 0-No pain         Complications: No complications documented.

## 2019-10-21 NOTE — Op Note (Signed)

## 2019-10-22 ENCOUNTER — Encounter (HOSPITAL_COMMUNITY): Payer: Self-pay | Admitting: Orthopaedic Surgery

## 2019-10-22 DIAGNOSIS — M1611 Unilateral primary osteoarthritis, right hip: Secondary | ICD-10-CM | POA: Diagnosis not present

## 2019-10-22 MED ORDER — METHOCARBAMOL 750 MG PO TABS: 750.0000 mg | ORAL_TABLET | Freq: Four times a day (QID) | ORAL | 1 refills | Status: DC | PRN

## 2019-10-22 MED ORDER — HYDROCODONE-ACETAMINOPHEN 5-325 MG PO TABS: 1.0000 | ORAL_TABLET | Freq: Four times a day (QID) | ORAL | 0 refills | Status: DC | PRN

## 2019-10-22 NOTE — Evaluation (Signed)
Physical Therapy Evaluation Patient Details Name: Isaiah Jones MRN: [id number] DOB: Jan 28, 1951 Today's Date: 10/22/2019   History of Present Illness  Pt is a 69 year old male s/p R THA  Clinical Impression  Pt is s/p THA resulting in the deficits listed below (see PT Problem List).  Pt will benefit from skilled PT to increase their independence and safety with mobility to allow discharge to the venue listed below.  Pt reports feeling stiff from being in bed all night and eager to mobilize.  Pt requiring increased time for activities however only min/guard assist.  Pt likely to d/c home later today after PM session for stair training.     Follow Up Recommendations Follow surgeons recommendation for DC plan and follow-up therapies    Equipment Recommendations  None recommended by PT    Recommendations for Other Services       Precautions / Restrictions Precautions Precautions: Fall Restrictions Weight Bearing Restrictions: No      Mobility  Bed Mobility Overal bed mobility: Needs Assistance Bed Mobility: Supine to Sit     Supine to sit: Supervision;HOB elevated        Transfers Overall transfer level: Needs assistance Equipment used: Rolling walker (2 wheeled) Transfers: Sit to/from Stand Sit to Stand: Min guard         General transfer comment: verbal cues for safe technique  Ambulation/Gait Ambulation/Gait assistance: Min guard Gait Distance (Feet): 200 Feet Assistive device: Rolling walker (2 wheeled) Gait Pattern/deviations: Step-to pattern;Decreased stance time - right;Antalgic     General Gait Details: verbal cues for sequence, RW positioning, step length posture  Stairs            Wheelchair Mobility    Modified Rankin (Stroke Patients Only)       Balance                                             Pertinent Vitals/Pain Pain Assessment: 0-10 Pain Score: 3  Pain Location: right hip Pain Descriptors / Indicators:  Sore;Aching Pain Intervention(s): Limited activity within patient's tolerance;Monitored during session;Ice applied    Home Living Family/patient expects to be discharged to:: Private residence Living Arrangements: Spouse/significant other Available Help at Discharge: Family Type of Home: House Home Access: Stairs to enter Entrance Stairs-Rails: Right Entrance Stairs-Number of Steps: 3 Home Layout: Able to live on main level with bedroom/bathroom Home Equipment: Gilford Rile - 2 wheels;Crutches      Prior Function Level of Independence: Independent               Hand Dominance        Extremity/Trunk Assessment        Lower Extremity Assessment Lower Extremity Assessment: RLE deficits/detail RLE Deficits / Details: anticipated post op hip weakness, grossly 2+/5 hip strength       Communication   Communication: No difficulties  Cognition Arousal/Alertness: Awake/alert Behavior During Therapy: WFL for tasks assessed/performed Overall Cognitive Status: Within Functional Limits for tasks assessed                                        General Comments      Exercises Total Joint Exercises Hip ABduction/ADduction: AROM;Right;10 reps;Standing Long Arc Quad: AROM;Right;10 reps;Seated Knee Flexion: AROM;Right;10 reps;Standing Marching in Standing: AROM;Right;10 reps;Standing  Assessment/Plan    PT Assessment Patient needs continued PT services  PT Problem List Decreased activity tolerance;Decreased strength;Decreased mobility;Decreased knowledge of use of DME;Pain       PT Treatment Interventions Stair training;Therapeutic exercise;Patient/family education;Gait training;DME instruction;Therapeutic activities;Functional mobility training    PT Goals (Current goals can be found in the Care Plan section)  Acute Rehab PT Goals PT Goal Formulation: With patient Time For Goal Achievement: 10/25/19 Potential to Achieve Goals: Good    Frequency  7X/week   Barriers to discharge        Co-evaluation               AM-PAC PT "6 Clicks" Mobility  Outcome Measure Help needed turning from your back to your side while in a flat bed without using bedrails?: A Little Help needed moving from lying on your back to sitting on the side of a flat bed without using bedrails?: A Little Help needed moving to and from a bed to a chair (including a wheelchair)?: A Little Help needed standing up from a chair using your arms (e.g., wheelchair or bedside chair)?: A Little Help needed to walk in hospital room?: A Little Help needed climbing 3-5 steps with a railing? : A Little 6 Click Score: 18    End of Session Equipment Utilized During Treatment: Gait belt Activity Tolerance: Patient tolerated treatment well Patient left: in chair;with call bell/phone within reach Nurse Communication: Mobility status PT Visit Diagnosis: Other abnormalities of gait and mobility (R26.89)    Time: 8616-8372 PT Time Calculation (min) (ACUTE ONLY): 26 min   Charges:   PT Evaluation $PT Eval Low Complexity: 1 Low PT Treatments $Therapeutic Exercise: 8-22 mins      Jannette Spanner PT, DPT Acute Rehabilitation Services Pager: [phone] Office: [phone]  York Ram E 10/22/2019, 12:13 PM

## 2019-10-22 NOTE — Discharge Summary (Signed)
Patient ID: Isaiah Jones MRN: [id number] DOB/AGE: Mar 07, 1951 69 y.o.  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    CHF (congestive heart failure) (HCC)    Colon polyps    Complication of anesthesia    Depression    Dyslipidemia    Dysrhythmia 2017   A fib   Family history of adverse reaction to anesthesia    unaware   GERD (gastroesophageal reflux disease)    Glaucoma    early stage   History of 2019 novel coronavirus disease (COVID-19) 06/2019   History of kidney stones    Hypertension    OSA on CPAP    C-Pap   Persistent atrial fibrillation ([place])    Echo 3/18: Mild LVH, EF 55-60, normal wall motion, grade 1 diastolic dysfunction, mild LAE   Pneumonia    childhood   PONV (postoperative nausea and vomiting)    after 1st knee surgery only   Rhinitis, allergic     Surgeries: Procedure(s): RIGHT TOTAL HIP ARTHROPLASTY ANTERIOR APPROACH on 10/21/2019   Consultants:   Discharged Condition: Improved  Hospital Course: VYRON FRONCZAK is an 69 y.o. male who was admitted 10/21/2019 for operative treatment ofPrimary localized osteoarthritis of right hip. Patient has severe unremitting pain that affects sleep, daily activities, and work/hobbies. After pre-op clearance the patient was taken to the operating room on 10/21/2019 and underwent  Procedure(s): RIGHT TOTAL HIP ARTHROPLASTY ANTERIOR APPROACH.    Patient was given perioperative antibiotics:  Anti-infectives (From admission, onward)   Start     Dose/Rate Route Frequency Ordered Stop   10/21/19 2130  ceFAZolin (ANCEF) IVPB 2g/100 mL premix         2 g 200 mL/hr over 30 Minutes Intravenous Every 6 hours 10/21/19 1846 10/22/19 0557   10/21/19 0600  ceFAZolin (ANCEF) 3 g in dextrose 5 % 50 mL IVPB        3 g 100 mL/hr over 30 Minutes Intravenous On call to O.R. 10/20/19 6283 10/21/19 1605       Patient was given sequential compression devices, early ambulation, and chemoprophylaxis to prevent DVT.  Patient benefited maximally from hospital stay and there were no complications.    Recent vital signs:  Patient Vitals for the past 24 hrs:  BP Temp Temp src Pulse Resp SpO2 Height Weight  10/22/19 0554 118/72 98.7 F (37.1 C) Oral 66 -- 95 % -- --  10/22/19 0203 120/79 98.2 F (36.8 C) Oral (!) 56 -- 94 % -- --  10/21/19 2324 -- -- -- 60 18 98 % -- --  10/21/19 2257 117/73 97.7 F (36.5 C) Oral (!) 56 -- 97 % -- --  10/21/19 2114 109/71 97.8 F (36.6 C) Oral 85 19 98 % -- --  10/21/19 1954 -- -- -- -- -- -- 6\' 2"  (1.88 m) 131.5 kg  10/21/19 1844 119/85 -- -- 80 17 (!) 86 % -- --  10/21/19 1830 (!) 137/91 97.6 F (36.4 C) -- 65 14 100 % -- --  10/21/19 1815 137/85 -- -- (!) 47 20 100 % -- --  10/21/19 1800 (!) 120/91 -- -- 80 20 100 % -- --  10/21/19 1745 (!) 152/107 -- -- 78 15 100 % -- --  10/21/19 1730 126/80 97.8 F (36.6 C) -- 60 15 100 % -- --  10/21/19 1715 133/70 (!) 96.8 F (36 C) -- 89 11 96 % -- --  10/21/19 1310 114/80 98.1 F (36.7 C) Oral 71 18 99 % -- --     Recent laboratory studies: No results for input(s): WBC, HGB, HCT, PLT, NA, K, CL, CO2, BUN, CREATININE, GLUCOSE, INR, CALCIUM in the last 72 hours.  Invalid input(s): PT, 2   Discharge Medications:   Allergies as of 10/22/2019   No Known Allergies     Medication List    STOP taking these medications   oxyCODONE 5 MG immediate release tablet Commonly known as: Oxy IR/ROXICODONE     TAKE these medications   acetaminophen 500 MG tablet Commonly known as: TYLENOL Take 500 mg by mouth every 6 (six) hours as needed (for pain.).   AIRBORNE  PO Take 1 tablet by mouth daily as needed (immune health).   amLODipine 5 MG tablet Commonly known as: NORVASC Take 1 tablet (5 mg total) by mouth daily.   amoxicillin 500 MG capsule Commonly known as: AMOXIL Take 500 mg by mouth as needed (for dental visits).   azelastine 0.1 % nasal spray Commonly known as: ASTELIN Place 2 sprays into both nostrils 2 (two) times daily.   Calcium-Vitamin D 600-400 MG-UNIT Tabs Take 1 tablet by mouth in the morning, at noon, and at bedtime.   diphenhydramine-acetaminophen 25-500 MG Tabs tablet Commonly known as: TYLENOL PM Take 2 tablets by mouth at bedtime.   fexofenadine 180 MG tablet Commonly known as: ALLEGRA Take 180 mg by mouth daily as needed for allergies or rhinitis.   Ginkgo Biloba 60 MG Tabs Take 60 mg by mouth daily.   HYDROcodone-acetaminophen 5-325 MG tablet Commonly known as: NORCO/VICODIN Take 1-2 tablets by mouth every 6 (six) hours as needed for moderate pain or severe pain (post op pain).   latanoprost 0.005 % ophthalmic solution Commonly known as: XALATAN Place 1 drop into both eyes at bedtime.   methocarbamol 750 MG tablet Commonly known as: ROBAXIN Take 1 tablet (750 mg total) by mouth every 6 (six) hours as needed for muscle spasms. What changed:   when to take this  reasons to take this   metoprolol succinate 50 MG 24 hr tablet Commonly known as: TOPROL-XL Take 1 tablet daily with or immediately following a meal. ** DO NOT CRUSH **    (BETA BLOCKER).   multivitamin with minerals Tabs tablet Take 2 tablets by mouth daily. Bariatric Multivitamin   Nasonex 50 MCG/ACT nasal spray Generic drug: mometasone Place 2 sprays into the nose at bedtime.   pantoprazole 40 MG tablet Commonly known as: PROTONIX Take 40 mg by mouth daily.   Pennsaid 2 % Soln Generic drug: Diclofenac Sodium Apply 1 Pump topically 4 (four) times daily as needed (aching joints).   rivaroxaban 20 MG Tabs tablet Commonly known as:  XARELTO Take 20 mg by mouth daily with supper.   rosuvastatin 20 MG tablet Commonly known as: CRESTOR Take 1 tablet (20 mg total) by mouth daily.   sertraline 50 MG tablet Commonly known as: ZOLOFT TAKE 1 TABLET DAILY   tadalafil 10 MG tablet Commonly known as: Cialis Take 1 tablet (10 mg total) by mouth daily as needed for erectile dysfunction.   valsartan 160 MG tablet Commonly known as: Diovan Take 1 tablet (160 mg total) by mouth  daily.            Durable Medical Equipment  (From admission, onward)         Start     Ordered   10/21/19 1847  DME Walker rolling  Once       Question:  Patient needs a walker to treat with the following condition  Answer:  Primary osteoarthritis of right hip   10/21/19 1846   10/21/19 1847  DME 3 n 1  Once        10/21/19 1846   10/21/19 1847  DME Bedside commode  Once       Question:  Patient needs a bedside commode to treat with the following condition  Answer:  Primary osteoarthritis of right hip   10/21/19 1846          Diagnostic Studies: DG Chest 2 View  Result Date: 10/17/2019 CLINICAL DATA:  69 year old male under preoperative evaluation prior to hip surgery. EXAM: CHEST - 2 VIEW COMPARISON:  Chest x-ray 10/03/2018. FINDINGS: Lung volumes are normal. No consolidative airspace disease. No pleural effusions. No pneumothorax. No pulmonary nodule or mass noted. Pulmonary vasculature and the cardiomediastinal silhouette are within normal limits. Atherosclerosis in the thoracic aorta. IMPRESSION: 1.  No radiographic evidence of acute cardiopulmonary disease. 2. Aortic atherosclerosis. Electronically Signed   By: Vinnie Langton M.D.   On: 10/17/2019 14:53   DG C-Arm 1-60 Min-No Report  Result Date: 10/21/2019 Fluoroscopy was utilized by the requesting physician.  No radiographic interpretation.   DG HIP OPERATIVE UNILAT W OR W/O PELVIS RIGHT  Result Date: 10/21/2019 CLINICAL DATA:  Right hip replacement EXAM: OPERATIVE right HIP  (WITH PELVIS IF PERFORMED) 6 VIEWS TECHNIQUE: Fluoroscopic spot image(s) were submitted for interpretation post-operatively. COMPARISON:  MRI 05/27/2018 FINDINGS: Six low resolution intraoperative spot views of the right hip. Total fluoroscopy time was 24 seconds. The images were obtained during operative course of right hip replacement. There is anatomic alignment. IMPRESSION: Intraoperative fluoroscopic assistance provided during right hip replacement surgery Electronically Signed   By: Donavan Foil M.D.   On: 10/21/2019 19:49    Disposition: Discharge disposition: 01-Home or Self Care       Discharge Instructions    Call MD / Call 911   Complete by: As directed    If you experience chest pain or shortness of breath, CALL 911 and be transported to the hospital emergency room.  If you develope a fever above 101 F, pus (white drainage) or increased drainage or redness at the wound, or calf pain, call your surgeon's office.   Constipation Prevention   Complete by: As directed    Drink plenty of fluids.  Prune juice may be helpful.  You may use a stool softener, such as Colace (over the counter) 100 mg twice a day.  Use MiraLax (over the counter) for constipation as needed.   Diet - low sodium heart healthy   Complete by: As directed    Discharge instructions   Complete by: As directed    INSTRUCTIONS AFTER JOINT REPLACEMENT   Remove items at home which could result in a fall. This includes throw rugs or furniture in walking pathways ICE to the affected joint every three hours while awake for 30 minutes at a time, for at least the first 3-5 days, and then as needed for pain and swelling.  Continue to use ice for pain and swelling. You may notice swelling that will progress down to the foot and ankle.  This  is normal after surgery.  Elevate your leg when you are not up walking on it.   Continue to use the breathing machine you got in the hospital (incentive spirometer) which will help keep  your temperature down.  It is common for your temperature to cycle up and down following surgery, especially at night when you are not up moving around and exerting yourself.  The breathing machine keeps your lungs expanded and your temperature down.   DIET:  As you were doing prior to hospitalization, we recommend a well-balanced diet.  DRESSING / WOUND CARE / SHOWERING  You may shower 3 days after surgery, but keep the wounds dry during showering.  You may use an occlusive plastic wrap (Press'n Seal for example), NO SOAKING/SUBMERGING IN THE BATHTUB.  If the bandage gets wet, change with a clean dry gauze.  If the incision gets wet, pat the wound dry with a clean towel.  ACTIVITY  Increase activity slowly as tolerated, but follow the weight bearing instructions below.   No driving for 6 weeks or until further direction given by your physician.  You cannot drive while taking narcotics.  No lifting or carrying greater than 10 lbs. until further directed by your surgeon. Avoid periods of inactivity such as sitting longer than an hour when not asleep. This helps prevent blood clots.  You may return to work once you are authorized by your doctor.     WEIGHT BEARING   Weight bearing as tolerated with assist device (walker, cane, etc) as directed, use it as long as suggested by your surgeon or therapist, typically at least 4-6 weeks.   EXERCISES  Results after joint replacement surgery are often greatly improved when you follow the exercise, range of motion and muscle strengthening exercises prescribed by your doctor. Safety measures are also important to protect the joint from further injury. Any time any of these exercises cause you to have increased pain or swelling, decrease what you are doing until you are comfortable again and then slowly increase them. If you have problems or questions, call your caregiver or physical therapist for advice.   Rehabilitation is important following a joint  replacement. After just a few days of immobilization, the muscles of the leg can become weakened and shrink (atrophy).  These exercises are designed to build up the tone and strength of the thigh and leg muscles and to improve motion. Often times heat used for twenty to thirty minutes before working out will loosen up your tissues and help with improving the range of motion but do not use heat for the first two weeks following surgery (sometimes heat can increase post-operative swelling).   These exercises can be done on a training (exercise) mat, on the floor, on a table or on a bed. Use whatever works the best and is most comfortable for you.    Use music or television while you are exercising so that the exercises are a pleasant break in your day. This will make your life better with the exercises acting as a break in your routine that you can look forward to.   Perform all exercises about fifteen times, three times per day or as directed.  You should exercise both the operative leg and the other leg as well.  Exercises include:   Quad Sets - Tighten up the muscle on the front of the thigh (Quad) and hold for 5-10 seconds.   Straight Leg Raises - With your knee straight (if you were given  a brace, keep it on), lift the leg to 60 degrees, hold for 3 seconds, and slowly lower the leg.  Perform this exercise against resistance later as your leg gets stronger.  Leg Slides: Lying on your back, slowly slide your foot toward your buttocks, bending your knee up off the floor (only go as far as is comfortable). Then slowly slide your foot back down until your leg is flat on the floor again.  Angel Wings: Lying on your back spread your legs to the side as far apart as you can without causing discomfort.  Hamstring Strength:  Lying on your back, push your heel against the floor with your leg straight by tightening up the muscles of your buttocks.  Repeat, but this time bend your knee to a comfortable angle, and  push your heel against the floor.  You may put a pillow under the heel to make it more comfortable if necessary.   A rehabilitation program following joint replacement surgery can speed recovery and prevent re-injury in the future due to weakened muscles. Contact your doctor or a physical therapist for more information on knee rehabilitation.    CONSTIPATION  Constipation is defined medically as fewer than three stools per week and severe constipation as less than one stool per week.  Even if you have a regular bowel pattern at home, your normal regimen is likely to be disrupted due to multiple reasons following surgery.  Combination of anesthesia, postoperative narcotics, change in appetite and fluid intake all can affect your bowels.   YOU MUST use at least one of the following options; they are listed in order of increasing strength to get the job done.  They are all available over the counter, and you may need to use some, POSSIBLY even all of these options:    Drink plenty of fluids (prune juice may be helpful) and high fiber foods Colace 100 mg by mouth twice a day  Senokot for constipation as directed and as needed Dulcolax (bisacodyl), take with full glass of water  Miralax (polyethylene glycol) once or twice a day as needed.  If you have tried all these things and are unable to have a bowel movement in the first 3-4 days after surgery call either your surgeon or your primary doctor.    If you experience loose stools or diarrhea, hold the medications until you stool forms back up.  If your symptoms do not get better within 1 week or if they get worse, check with your doctor.  If you experience "the worst abdominal pain ever" or develop nausea or vomiting, please contact the office immediately for further recommendations for treatment.   ITCHING:  If you experience itching with your medications, try taking only a single pain pill, or even half a pain pill at a time.  You can also use  Benadryl over the counter for itching or also to help with sleep.   TED HOSE STOCKINGS:  Use stockings on both legs until for at least 2 weeks or as directed by physician office. They may be removed at night for sleeping.  MEDICATIONS:  See your medication summary on the "After Visit Summary" that nursing will review with you.  You may have some home medications which will be placed on hold until you complete the course of blood thinner medication.  It is important for you to complete the blood thinner medication as prescribed.  PRECAUTIONS:  If you experience chest pain or shortness of breath - call 911  immediately for transfer to the hospital emergency department.   If you develop a fever greater that 101 F, purulent drainage from wound, increased redness or drainage from wound, foul odor from the wound/dressing, or calf pain - CONTACT YOUR SURGEON.                                                   FOLLOW-UP APPOINTMENTS:  If you do not already have a post-op appointment, please call the office for an appointment to be seen by your surgeon.  Guidelines for how soon to be seen are listed in your "After Visit Summary", but are typically between 1-4 weeks after surgery.  OTHER INSTRUCTIONS:   Knee Replacement:  Do not place pillow under knee, focus on keeping the knee straight while resting. CPM instructions: 0-90 degrees, 2 hours in the morning, 2 hours in the afternoon, and 2 hours in the evening. Place foam block, curve side up under heel at all times except when in CPM or when walking.  DO NOT modify, tear, cut, or change the foam block in any way.   DENTAL ANTIBIOTICS:  In most cases prophylactic antibiotics for Dental procdeures after total joint surgery are not necessary.  Exceptions are as follows:  1. History of prior total joint infection  2. Severely immunocompromised (Organ Transplant, cancer chemotherapy, Rheumatoid biologic meds such as [place])  3. Poorly controlled diabetes  (A1C &gt; 8.0, blood glucose over 200)  If you have one of these conditions, contact your surgeon for an antibiotic prescription, prior to your dental procedure.   MAKE SURE YOU:  Understand these instructions.  Get help right away if you are not doing well or get worse.    Thank you for letting us be a part of your medical care team.  It is a privilege we respect greatly.  We hope these instructions will help you stay on track for a fast and full recovery!   Increase activity slowly as tolerated   Complete by: As directed        Follow-up Information    Melrose Nakayama, MD. Schedule an appointment as soon as possible for a visit in 2 weeks.   Specialty: Orthopedic Surgery Contact information: [place] [address] [phone]                Signed: Larwance Sachs Sariyah Corcino 10/22/2019, 7:41 AM

## 2019-10-22 NOTE — Progress Notes (Signed)
Physical Therapy Treatment Patient Details Name: Isaiah Jones MRN: [id number] DOB: April 28, 1951 Today's Date: 10/22/2019    History of Present Illness Pt is a 69 year old male s/p R THA    PT Comments    Spouse present and observed session. Pt ambulated in hallway short distance and practiced safe stair technique.  Pt also performed supine LE exercises.  Pt provided with HEP handout.  Pt's questions all answered within scope of practice.  Pt feels ready for d/c home today.    Follow Up Recommendations  Follow surgeon's recommendation for DC plan and follow-up therapies     Equipment Recommendations  None recommended by PT    Recommendations for Other Services       Precautions / Restrictions Precautions Precautions: Fall Restrictions Weight Bearing Restrictions: No    Mobility  Bed Mobility Overal bed mobility: Needs Assistance Bed Mobility: Supine to Sit     Supine to sit: Supervision;HOB elevated     General bed mobility comments: pt in recliner  Transfers Overall transfer level: Needs assistance Equipment used: Rolling walker (2 wheeled) Transfers: Sit to/from Stand Sit to Stand: Min guard         General transfer comment: min/guard for safety  Ambulation/Gait Ambulation/Gait assistance: Min guard Gait Distance (Feet): 80 Feet Assistive device: Rolling walker (2 wheeled) Gait Pattern/deviations: Step-to pattern;Decreased stance time - right;Antalgic     General Gait Details: verbal cues for sequence, RW positioning, step length posture   Stairs Stairs: Yes Stairs assistance: Min guard Stair Management: Step to pattern;Forwards;With crutches;One rail Right Number of Stairs: 3 General stair comments: verbal cues for sequence, pt did well with rail and crutch, performed twice   Wheelchair Mobility    Modified Rankin (Stroke Patients Only)       Balance                                            Cognition  Arousal/Alertness: Awake/alert Behavior During Therapy: WFL for tasks assessed/performed Overall Cognitive Status: Within Functional Limits for tasks assessed                                        Exercises Total Joint Exercises Ankle Circles/Pumps: AROM;Both;10 reps Quad Sets: AROM;Both;10 reps Short Arc Quad: AROM;Right;10 reps Heel Slides: AAROM;Right;10 reps Hip ABduction/ADduction: AAROM;Right;10 reps Long Arc Quad: AROM;Right;10 reps;Seated Knee Flexion: AROM;Right;10 reps;Standing Marching in Standing: AROM;Right;10 reps;Standing    General Comments        Pertinent Vitals/Pain Pain Assessment: 0-10 Pain Score: 3  Pain Location: right hip Pain Descriptors / Indicators: Sore;Aching Pain Intervention(s): Monitored during session;Repositioned;Patient requesting pain meds-RN notified    Home Living Family/patient expects to be discharged to:: Private residence Living Arrangements: Spouse/significant other Available Help at Discharge: Family Type of Home: House Home Access: Stairs to enter Entrance Stairs-Rails: Right Home Layout: Able to live on main level with bedroom/bathroom Home Equipment: Walker - 2 wheels;Crutches      Prior Function Level of Independence: Independent          PT Goals (current goals can now be found in the care plan section) Acute Rehab PT Goals PT Goal Formulation: With patient Time For Goal Achievement: 10/25/19 Potential to Achieve Goals: Good Progress towards PT goals: Progressing toward goals  Frequency    7X/week      PT Plan Current plan remains appropriate    Co-evaluation              AM-PAC PT "6 Clicks" Mobility   Outcome Measure  Help needed turning from your back to your side while in a flat bed without using bedrails?: A Little Help needed moving from lying on your back to sitting on the side of a flat bed without using bedrails?: A Little Help needed moving to and from a bed to a  chair (including a wheelchair)?: A Little Help needed standing up from a chair using your arms (e.g., wheelchair or bedside chair)?: A Little Help needed to walk in hospital room?: A Little Help needed climbing 3-5 steps with a railing? : A Little 6 Click Score: 18    End of Session Equipment Utilized During Treatment: Gait belt Activity Tolerance: Patient tolerated treatment well Patient left: in chair;with call bell/phone within reach;with family/visitor present Nurse Communication: Mobility status PT Visit Diagnosis: Other abnormalities of gait and mobility (R26.89)     Time: 1350-1411 PT Time Calculation (min) (ACUTE ONLY): 21 min  Charges:  $Gait Training: 8-22 mins            Arlyce Dice, DPT Acute Rehabilitation Services Pager: [phone] Office: [phone]  York Ram E 10/22/2019, 3:00 PM

## 2019-10-22 NOTE — TOC Transition Note (Signed)
Transition of Care [facility]) - CM/SW Discharge Note   Patient Details  Name: Isaiah Jones MRN: [id number] Date of Birth: 1950/11/25  [facility] CM/SW Contact:  Lia Hopping, [place] Phone Number: 10/22/2019, 11:53 AM   Clinical Narrative:    Therapy Plan: [facility] ([place]) Patient has DME,    Final next level of care: [place] Barriers to Discharge: No Barriers Identified   Patient Goals and CMS Choice Patient states their goals for this hospitalization and ongoing recovery are:: return home, walk without pain CMS Medicare.gov Compare Post Acute Care list provided to:: Patient Choice offered to / list presented to : Patient  Discharge Placement                       Discharge Plan and Services                DME Arranged: N/A DME Agency: NA       HH Arranged: PT [place] Agency: [facility] (formerly [employer]) Date [place]: 10/22/19 Time Twin: 1152 Representative spoke with at [place]: [place] ([place]) Interventions     Readmission Risk Interventions No flowsheet data found.

## 2019-10-22 NOTE — Progress Notes (Signed)
Subjective: 1 Day Post-Op Procedure(s) (LRB): RIGHT TOTAL HIP ARTHROPLASTY ANTERIOR APPROACH (Right) Patent doing well and is hoping to go home today.  Activity level:  wbat Diet tolerance:  ok Voiding:  ok Patient reports pain as mild.    Objective: Vital signs in last 24 hours: Temp:  [96.8 F (36 C)-98.7 F (37.1 C)] 98.7 F (37.1 C) (06/23 0554) Pulse Rate:  [47-89] 66 (06/23 0554) Resp:  [11-20] 18 (06/22 2324) BP: (109-152)/(70-107) 118/72 (06/23 0554) SpO2:  [86 %-100 %] 95 % (06/23 0554) Weight:  [131.5 kg] 131.5 kg (06/22 1954)  Labs: No results for input(s): HGB in the last 72 hours. No results for input(s): WBC, RBC, HCT, PLT in the last 72 hours. No results for input(s): NA, K, CL, CO2, BUN, CREATININE, GLUCOSE, CALCIUM in the last 72 hours. No results for input(s): LABPT, INR in the last 72 hours.  Physical Exam:  Neurologically intact ABD soft Neurovascular intact Sensation intact distally Intact pulses distally Dorsiflexion/Plantar flexion intact Incision: dressing C/D/I and scant drainage No cellulitis present Compartment soft  Assessment/Plan:  1 Day Post-Op Procedure(s) (LRB): RIGHT TOTAL HIP ARTHROPLASTY ANTERIOR APPROACH (Right) Advance diet Up with therapy D/C IV fluids Discharge home with home health today if cleared by PT Contiue on home dose xarelto. Follow up in office 2 weeks post op.    Larwance Sachs Denis Koppel 10/22/2019, 7:37 AM

## 2019-10-22 NOTE — Progress Notes (Signed)
Patient discharged to home w/ family. Given all belongings, instructions. Verbalized understanding of instructions. Escorted to pov via w/c. 

## 2019-10-23 NOTE — Anesthesia Postprocedure Evaluation (Signed)
Anesthesia Post Note  Patient: Isaiah Jones  Procedure(s) Performed: RIGHT TOTAL HIP ARTHROPLASTY ANTERIOR APPROACH (Right Hip)     Patient location during evaluation: PACU Anesthesia Type: Spinal Level of consciousness: awake and alert, oriented and patient cooperative Pain management: pain level controlled Vital Signs Assessment: post-procedure vital signs reviewed and stable Respiratory status: spontaneous breathing, nonlabored ventilation, respiratory function stable and patient connected to nasal cannula oxygen Cardiovascular status: blood pressure returned to baseline and stable Postop Assessment: no apparent nausea or vomiting and spinal receding Anesthetic complications: no Comments: Delayed note entry, pt eval in PACU post op   No complications documented.              Midge Minium

## 2019-11-19 ENCOUNTER — Encounter: Payer: Self-pay | Admitting: Dietician

## 2019-11-19 ENCOUNTER — Encounter: Payer: Medicare Other | Attending: Surgery | Admitting: Dietician

## 2019-11-19 ENCOUNTER — Other Ambulatory Visit: Payer: Self-pay | Admitting: Family Medicine

## 2019-11-19 DIAGNOSIS — Z79899 Other long term (current) drug therapy: Secondary | ICD-10-CM | POA: Diagnosis not present

## 2019-11-19 DIAGNOSIS — E7800 Pure hypercholesterolemia, unspecified: Secondary | ICD-10-CM | POA: Insufficient documentation

## 2019-11-19 DIAGNOSIS — Z6841 Body Mass Index (BMI) 40.0 and over, adult: Secondary | ICD-10-CM | POA: Insufficient documentation

## 2019-11-19 DIAGNOSIS — Z7901 Long term (current) use of anticoagulants: Secondary | ICD-10-CM | POA: Insufficient documentation

## 2019-11-19 DIAGNOSIS — I10 Essential (primary) hypertension: Secondary | ICD-10-CM | POA: Insufficient documentation

## 2019-11-19 DIAGNOSIS — Z713 Dietary counseling and surveillance: Secondary | ICD-10-CM | POA: Insufficient documentation

## 2019-11-19 DIAGNOSIS — E669 Obesity, unspecified: Secondary | ICD-10-CM

## 2019-11-19 DIAGNOSIS — G4733 Obstructive sleep apnea (adult) (pediatric): Secondary | ICD-10-CM | POA: Insufficient documentation

## 2019-11-19 NOTE — Progress Notes (Signed)
Bariatric Nutrition Follow-Up Visit Medical Nutrition Therapy  Appt Start Time: 7:30am    End Time: 7:45am  MyChart Visit  This visit was completed via MyChart due to the COVID-19 pandemic.   I spoke with Laurence Spates and verified that I was speaking with the correct person with two patient identifiers (full name and date of birth).   I discussed the limitations related to this kind of visit and the patient is willing to proceed.  4 Months Post-Operative Sleeve Surgery Surgery Date: 07/15/2019  Pt's Expectations of Surgery/ Goals: to weigh ~220 lbs; ultimately to qualify for hip replacement; to relieve high blood pressure, high cholesterol, sleep apnea  Pt Reported Successes: weight loss, hip replacement surgery     NUTRITION ASSESSMENT  Anthropometrics  Start weight at [facility]: 346.7 lbs (date: 10/03/2018) Today's weight: 279 lbs (pt reported)   Body Composition Scale 07/29/2019 09/09/2019 11/19/2019  Weight  lbs 324 301.3 279  BMI 41.6 38.7 35.8  Total Body Fat  % 37.7 35.7 -     Visceral Fat 34 - -  Fat-Free Mass  % 62.2 - -     Total Body Water  % 43.2 45.2 -     Muscle-Mass  lbs 61.1 - -  Body Fat Displacement --- --- ---         Torso  lbs 75.8 - -         Left Leg  lbs 15.1 - -         Right Leg  lbs 15.1 - -         Left Arm  lbs 7.5 - -         Right Arm  lbs 7.5 - -    Lifestyle & Dietary Hx States greasy foods make him sick. Has tried drinking with meals a couple times which made him sick, so he tries not to drink while eating. Drinks coffee to help prevent constipation.   24-Hr Dietary Recall First Meal: Total cereal + 1 cup 2% milk + banana/blueberries Snack: -  Second Meal: protein shake  Snack: -  Third Meal: grilled chicken (or pork chop, or hamburger) + string beans + squash Snack: - Beverages: water, coffee w/ french vanilla creamer   Estimated daily fluid intake: 45-90 oz water Estimated daily protein intake: 70-80 g Supplements: bariatric MVI,  calcium Current average weekly physical activity: physical therapy 2x/week (for hip), recumbent bike    Post-Op Goals/ Signs/ Symptoms Using straws: no Drinking while eating: sometimes  Chewing/swallowing difficulties: no Changes in vision: no Changes to mood/headaches: no Hair loss/changes to skin/nails: no Difficulty focusing/concentrating: no Sweating: no Dizziness/lightheadedness: no Palpitations: no  Carbonated/caffeinated beverages: yes (coffee) N/V/D/C/Gas: some constipation Abdominal pain: no Dumping syndrome: no   NUTRITION DIAGNOSIS  Overweight/obesity ([place]-3.3) related to past poor dietary habits and physical inactivity as evidenced by completed bariatric surgery and following dietary guidelines for continued weight loss and healthy nutrition status.   NUTRITION INTERVENTION Nutrition counseling (C-1) and education (E-2) to facilitate bariatric surgery goals, including: . The importance of consuming adequate calories as well as certain nutrients daily due to the body's need for essential vitamins, minerals, and fats . The importance of daily physical activity and to reach a goal of at least 150 minutes of moderate to vigorous physical activity weekly (or as directed by their physician) due to benefits such as increased musculature and improved lab values  Handouts Provided Include   Phase 4: Protein + Non-Starchy Vegetables   Emailed to  pt: [email or web address]   Learning Style & Readiness for Change Teaching method utilized: Visual & Auditory  Demonstrated degree of understanding via: Teach Back  Barriers to learning/adherence to lifestyle change: None Identified     MONITORING & EVALUATION Dietary intake, weekly physical activity, body weight, and goals in 2 months.  Next Steps Patient is to follow-up in 2 months via MyChart for 6 month post-op follow-up.

## 2019-12-10 ENCOUNTER — Encounter: Payer: Self-pay | Admitting: Family Medicine

## 2019-12-10 ENCOUNTER — Ambulatory Visit (INDEPENDENT_AMBULATORY_CARE_PROVIDER_SITE_OTHER): Payer: Medicare Other | Admitting: Family Medicine

## 2019-12-10 ENCOUNTER — Other Ambulatory Visit: Payer: Self-pay

## 2019-12-10 VITALS — BP 110/72 | HR 68 | Temp 97.8°F | Ht 74.0 in | Wt 281.2 lb

## 2019-12-10 DIAGNOSIS — I10 Essential (primary) hypertension: Secondary | ICD-10-CM

## 2019-12-10 DIAGNOSIS — Z87891 Personal history of nicotine dependence: Secondary | ICD-10-CM

## 2019-12-10 DIAGNOSIS — J302 Other seasonal allergic rhinitis: Secondary | ICD-10-CM

## 2019-12-10 NOTE — Progress Notes (Signed)
   Isaiah Jones is a 69 y.o. male who presents today for an office visit.  Assessment/Plan:  Chronic Problems Addressed Today: Morbid obesity ([place]) Down about 20 pounds since last visit and overall about 70 pounds since his surgery.  We will continue working on diet and exercise.  Seasonal allergies Has had a lingering cough for several years.  Possibly postnasal drip.  Patient does not wish to start any additional medication at this point.  We will continue with watchful waiting.  Essential hypertension 110/72 today.  We will stop valsartan.  He will check with me in a couple of weeks with his blood pressure readings.  Follow-up in 6 months.  Former smoker Recommended lung cancer screening CT especially in light of chronic cough however patient deferred.     Subjective:  HPI:  Patient here for follow-up.  Last seen about 2 months ago.  Since her last visit he has had hip replacement.  Is done well with this.  He has continued to lose weight.       Objective:  Physical Exam: BP 110/72   Pulse 68   Temp 97.8 F (36.6 C)   Ht 6\' 2"  (8.18 m)   Wt 281 lb 3.2 oz (127.6 kg)   SpO2 96%   BMI 36.10 kg/m   Wt Readings from Last 3 Encounters:  12/10/19 281 lb 3.2 oz (127.6 kg)  11/19/19 279 lb (126.6 kg)  10/21/19 289 lb 14.5 oz (131.5 kg)  Gen: No acute distress, resting comfortably CV: Regular rate and rhythm with no murmurs appreciated Pulm: Normal work of breathing, clear to auscultation bilaterally with no crackles, wheezes, or rhonchi Neuro: Grossly normal, moves all extremities Psych: Normal affect and thought content      Lakaisha Danish M. Jerline Pain, MD 12/10/2019 9:44 AM

## 2019-12-10 NOTE — Assessment & Plan Note (Signed)
Has had a lingering cough for several years.  Possibly postnasal drip.  Patient does not wish to start any additional medication at this point.  We will continue with watchful waiting.

## 2019-12-10 NOTE — Assessment & Plan Note (Signed)
110/72 today.  We will stop valsartan.  He will check with me in a couple of weeks with his blood pressure readings.  Follow-up in 6 months.

## 2019-12-10 NOTE — Patient Instructions (Signed)
It was very nice to see you today!  Please stop the valsartan.  Keep in on your blood pressure and let me know how it looks over the next couple weeks.  Come back to see me in 6 months.  Please come back to me sooner if needed.  Take care, Dr Jerline Pain  Please try these tips to maintain a healthy lifestyle:   Eat at least 3 REAL meals and 1-2 snacks per day.  Aim for no more than 5 hours between eating.  If you eat breakfast, please do so within one hour of getting up.    Each meal should contain half fruits/vegetables, one quarter protein, and one quarter carbs (no bigger than a computer mouse)   Cut down on sweet beverages. This includes juice, soda, and sweet tea.     Drink at least 1 glass of water with each meal and aim for at least 8 glasses per day   Exercise at least 150 minutes every week.

## 2019-12-10 NOTE — Assessment & Plan Note (Signed)
Recommended lung cancer screening CT especially in light of chronic cough however patient deferred.

## 2019-12-10 NOTE — Assessment & Plan Note (Signed)
Down about 20 pounds since last visit and overall about 70 pounds since his surgery.  We will continue working on diet and exercise.

## 2019-12-26 ENCOUNTER — Telehealth: Payer: Self-pay

## 2019-12-26 NOTE — Telephone Encounter (Signed)
BP readings 4x a day for 15 days The average of all 15 days pt says is 134/83 The highest reading pt has experienced is 149/97

## 2019-12-26 NOTE — Telephone Encounter (Signed)
Happy with those readings. Would like for him to continue to monitor periodically and let us know if elevated persistently to 140/90 or higher.  Algis Greenhouse. Jerline Pain, MD 12/26/2019 1:59 PM

## 2019-12-26 NOTE — Telephone Encounter (Signed)
Spoke with patient, he stop valsartan and want to report his blood pressure readings.

## 2019-12-26 NOTE — Telephone Encounter (Signed)
Pt.notified

## 2019-12-31 ENCOUNTER — Other Ambulatory Visit: Payer: Self-pay | Admitting: Family Medicine

## 2020-01-14 ENCOUNTER — Encounter: Payer: Medicare Other | Attending: Surgery | Admitting: Dietician

## 2020-01-14 DIAGNOSIS — Z713 Dietary counseling and surveillance: Secondary | ICD-10-CM | POA: Insufficient documentation

## 2020-01-14 DIAGNOSIS — E7800 Pure hypercholesterolemia, unspecified: Secondary | ICD-10-CM | POA: Insufficient documentation

## 2020-01-14 DIAGNOSIS — Z7901 Long term (current) use of anticoagulants: Secondary | ICD-10-CM | POA: Insufficient documentation

## 2020-01-14 DIAGNOSIS — Z6841 Body Mass Index (BMI) 40.0 and over, adult: Secondary | ICD-10-CM | POA: Insufficient documentation

## 2020-01-14 DIAGNOSIS — I10 Essential (primary) hypertension: Secondary | ICD-10-CM | POA: Insufficient documentation

## 2020-01-14 DIAGNOSIS — Z79899 Other long term (current) drug therapy: Secondary | ICD-10-CM | POA: Insufficient documentation

## 2020-01-14 DIAGNOSIS — G4733 Obstructive sleep apnea (adult) (pediatric): Secondary | ICD-10-CM | POA: Insufficient documentation

## 2020-01-28 ENCOUNTER — Other Ambulatory Visit: Payer: Self-pay | Admitting: Family Medicine

## 2020-02-12 ENCOUNTER — Ambulatory Visit: Payer: Medicare Other | Admitting: Family Medicine

## 2020-02-12 ENCOUNTER — Other Ambulatory Visit: Payer: Self-pay

## 2020-02-12 ENCOUNTER — Encounter: Payer: Self-pay | Admitting: Family Medicine

## 2020-02-12 VITALS — BP 122/77 | HR 65 | Temp 96.5°F | Ht 74.0 in | Wt 273.8 lb

## 2020-02-12 DIAGNOSIS — Z87891 Personal history of nicotine dependence: Secondary | ICD-10-CM | POA: Diagnosis not present

## 2020-02-12 DIAGNOSIS — R053 Chronic cough: Secondary | ICD-10-CM | POA: Insufficient documentation

## 2020-02-12 DIAGNOSIS — I10 Essential (primary) hypertension: Secondary | ICD-10-CM | POA: Diagnosis not present

## 2020-02-12 DIAGNOSIS — R059 Cough, unspecified: Secondary | ICD-10-CM

## 2020-02-12 MED ORDER — DULERA 100-5 MCG/ACT IN AERO: 2.0000 | INHALATION_SPRAY | Freq: Two times a day (BID) | RESPIRATORY_TRACT | 0 refills | Status: DC

## 2020-02-12 NOTE — Patient Instructions (Signed)
It was very nice to see you today!  I think you probably had a mild concussion from your fall.  You have a normal exam today.  Please let me know if your symptoms return.  Please try the inhaler.  Also like to get a CT scan of your lungs.  We will call you with results of this once they are available.  Take care, Dr Jerline Pain  Please try these tips to maintain a healthy lifestyle:   Eat at least 3 REAL meals and 1-2 snacks per day.  Aim for no more than 5 hours between eating.  If you eat breakfast, please do so within one hour of getting up.    Each meal should contain half fruits/vegetables, one quarter protein, and one quarter carbs (no bigger than a computer mouse)   Cut down on sweet beverages. This includes juice, soda, and sweet tea.     Drink at least 1 glass of water with each meal and aim for at least 8 glasses per day   Exercise at least 150 minutes every week.

## 2020-02-12 NOTE — Assessment & Plan Note (Signed)
Will place referral for lung cancer screening. 

## 2020-02-12 NOTE — Assessment & Plan Note (Signed)
Chest x-ray with last year which was normal however he is due for lung cancer screening.  Will place referral for this to get them.  Likely has some underlying COPD.  Will start Dulera.  If no improvement or depending on results of lung cancer screening may need referral to pulmonology for PFTs.

## 2020-02-12 NOTE — Progress Notes (Signed)
   Isaiah Jones is a 69 y.o. male who presents today for an office visit.  Assessment/Plan:  Chronic Problems Addressed Today: Cough Chest x-ray with last year which was normal however he is due for lung cancer screening.  Will place referral for this to get them.  Likely has some underlying COPD.  Will start Dulera.  If no improvement or depending on results of lung cancer screening may need referral to pulmonology for PFTs.  Former smoker Will place referral for lung cancer screening.  Essential hypertension Doing well off valsartan.  We will continue Norvasc 5 mg daily.     Subjective:  HPI:  Patient fell 4 weeks ago.  Suffered a chemical fall.  Was working on a vehicle when he slipped and fell backwards.  Landed directly on his back in the back of his head on dirt.  He had persistent headache and blurred vision for a few weeks.  He saw eye doctor who had normal evaluation.  Headache has essentially resolved.  No weakness or numbness.  He has also had a persistent cough for several years.  Symptoms seem to be worsening.  Cough is occasionally productive of clear or white sputum.  No reported fevers or chills.  Had a smoking history but has not smoked for about 18 years.       Objective:  Physical Exam: BP 122/77   Pulse 65   Temp (!) 96.5 F (35.8 C) (Temporal)   Ht 6\' 2"  (1.88 m)   Wt 273 lb 12.8 oz (124.2 kg)   SpO2 97%   BMI 35.15 kg/m   Gen: No acute distress, resting comfortably CV: Regular rate and rhythm with no murmurs appreciated Pulm: Normal work of breathing, clear to auscultation bilaterally with no crackles, wheezes, or rhonchi Neuro: Grossly normal, moves all extremities.  Cranial nerves II through XII intact.  Finger-nose-finger testing intact bilaterally.  Reflexes 2+ and symmetric bilaterally. Psych: Normal affect and thought content      Leyana Whidden M. Jerline Pain, MD 02/12/2020 12:20 PM

## 2020-02-12 NOTE — Assessment & Plan Note (Signed)
Doing well off valsartan.  We will continue Norvasc 5 mg daily.

## 2020-02-26 NOTE — Progress Notes (Deleted)
Cardiology Office Note   Date:  02/26/2020   ID:  Isaiah Jones, DOB 1950-06-29, MRN [id number]  PCP:  Vivi Barrack, MD    No chief complaint on file.  AFib  Wt Readings from Last 3 Encounters:  02/12/20 273 lb 12.8 oz (124.2 kg)  12/10/19 281 lb 3.2 oz (127.6 kg)  11/19/19 279 lb (126.6 kg)       History of Present Illness: Isaiah Jones is a 69 y.o. male  with history of hyperlipidemia, hypertension and obesity. He also is treated for sleep apnea with CPAP. He was seen in our office back in March 2018 due to new onset atrial fibrillation. His pulmonologist found the irregular heartbeat. Atrial fibrillation was diagnosed. He has had some fatigue and intermittent palpitations over the past few months prior to diagnosis.  At his initial visit in our office in March 2018, he was started on Toprol-XL 50 mg daily for rate control, Xarelto 20 mg daily for stroke prevention. Cardioversion was considered as well. He was advised to lose weight.  Echocardiogram from March 2018 revealed: - Left ventricle: The cavity size was normal. Wall thickness was increased in a pattern of mild LVH. Systolic function was normal. The estimated ejection fraction was in the range of 55% to 60%. Wall motion was normal; there were no regional wall motion abnormalities. Doppler parameters are consistent with abnormal left ventricular relaxation (grade 1 diastolic dysfunction). - Left atrium: The atrium was mildly dilated. Volume/bsa, S: 37.4 ml/m^2.  He had his cardioversion but it was not successful. He has been maintained on rate control meds. He feels colder than he did before after starting anticoagulation.  He has had weight fluctuations.  He has some hip problems but was told he is too big for ortho surgery. He was 375 lbs at that time.   At the last visit, he was considering weight loss surgery, around 03/2019.     Past Medical History:  Diagnosis Date  . Anxiety    . Arthritis, degenerative    hip,shoulders, hands, back  . 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 CHF (congestive heart failure) ([place])   . Colon polyps   . Complication of anesthesia   . Depression   . Dyslipidemia   . Dysrhythmia 2017   A fib  . Family history of adverse reaction to anesthesia    unaware  . GERD (gastroesophageal reflux disease)   . Glaucoma    early stage  . History of 2019 novel coronavirus disease (COVID-19) 06/2019  . History of kidney stones   . Hypertension   . OSA on CPAP    C-Pap  . Persistent atrial fibrillation ([place])    Echo 3/18: Mild LVH, EF 55-60, normal wall motion, grade 1 diastolic dysfunction, mild LAE  . Pneumonia    childhood  . PONV (postoperative nausea and vomiting)    after 1st knee surgery only  . Rhinitis, allergic     Past Surgical History:  Procedure Laterality Date  . arthroscopic knee surgery Bilateral 2003  . CARDIOVERSION N/A 08/10/2016   Procedure: CARDIOVERSION;  Surgeon: Pixie Casino, MD;  Location: [facility] ENDOSCOPY;  Service: Cardiovascular;  Laterality: N/A;  . COLONOSCOPY    . LAPAROSCOPIC GASTRIC SLEEVE RESECTION N/A 07/15/2019   Procedure: LAPAROSCOPIC GASTRIC SLEEVE RESECTION, Upper Endo, ERAS Pathway;  Surgeon: [name], MD;  Location: [facility] ORS;  Service: General;  Laterality: N/A;  . REPLACEMENT TOTAL KNEE Bilateral 2008  .  TONSILLECTOMY    . TOTAL HIP ARTHROPLASTY Right 10/21/2019   Procedure: RIGHT TOTAL HIP ARTHROPLASTY ANTERIOR APPROACH;  Surgeon: Melrose Nakayama, MD;  Location: [facility] ORS;  Service: Orthopedics;  Laterality: Right;  Marland Kitchen VASECTOMY       Current Outpatient Medications  Medication Sig Dispense Refill  . acetaminophen (TYLENOL) 500 MG tablet Take 500 mg by mouth every 6 (six) hours as needed (for pain.).     Marland Kitchen amLODipine (NORVASC) 5 MG tablet TAKE 1 TABLET DAILY 90 tablet 1  . amoxicillin (AMOXIL) 500 MG capsule Take 500 mg by mouth as needed (for dental  visits). (Patient not taking: Reported on 02/12/2020)    . azelastine (ASTELIN) 0.1 % nasal spray Place 2 sprays into both nostrils 2 (two) times daily. 30 mL 12  . Calcium Carb-Cholecalciferol (CALCIUM-VITAMIN D) 600-400 MG-UNIT TABS Take 1 tablet by mouth in the morning, at noon, and at bedtime.    . diphenhydramine-acetaminophen (TYLENOL PM) 25-500 MG TABS tablet Take 2 tablets by mouth at bedtime.     . fexofenadine (ALLEGRA) 180 MG tablet Take 180 mg by mouth daily as needed for allergies or rhinitis.    . Ginkgo Biloba 60 MG TABS Take 60 mg by mouth daily.    Marland Kitchen latanoprost (XALATAN) 0.005 % ophthalmic solution Place 1 drop into both eyes at bedtime.     . methocarbamol (ROBAXIN) 750 MG tablet Take 1 tablet (750 mg total) by mouth every 6 (six) hours as needed for muscle spasms. 40 tablet 1  . metoprolol succinate (TOPROL-XL) 50 MG 24 hr tablet Take 1 tablet daily with or immediately following a meal. ** DO NOT CRUSH **    (BETA BLOCKER). 90 tablet 3  . mometasone (NASONEX) 50 MCG/ACT nasal spray Place 2 sprays into the nose at bedtime.     . mometasone-formoterol (DULERA) 100-5 MCG/ACT AERO Inhale 2 puffs into the lungs in the morning and at bedtime. 1 each 0  . Multiple Vitamin (MULTIVITAMIN WITH MINERALS) TABS tablet Take 2 tablets by mouth daily. Bariatric Multivitamin    . Multiple Vitamins-Minerals (AIRBORNE PO) Take 1 tablet by mouth daily as needed (immune health).     . PENNSAID 2 % SOLN Apply 1 Pump topically 4 (four) times daily as needed (aching joints).   1  . rivaroxaban (XARELTO) 20 MG TABS tablet Take 20 mg by mouth daily with supper.    . rosuvastatin (CRESTOR) 20 MG tablet TAKE 1 TABLET DAILY 90 tablet 3  . sertraline (ZOLOFT) 50 MG tablet TAKE 1 TABLET DAILY 90 tablet 0  . tadalafil (CIALIS) 10 MG tablet Take 1 tablet (10 mg total) by mouth daily as needed for erectile dysfunction. 30 tablet 0   No current facility-administered medications for this visit.    Allergies:    Patient has no known allergies.    Social History:  The patient  reports that he quit smoking about 18 years ago. His smoking use included cigarettes. He has never used smokeless tobacco. He reports previous alcohol use. He reports previous drug use.   Family History:  The patient's ***family history includes Alzheimer's disease in his mother; Arthritis in his father and mother; Heart attack in his mother; Heart attack (age of onset: 42) in his father; Hyperlipidemia in his brother, father, and mother; Hypertension in his brother, father, and mother; Other in his brother.    ROS:  Please see the history of present illness.   Otherwise, review of systems are positive for ***.  All other systems are reviewed and negative.    PHYSICAL EXAM: VS:  There were no vitals taken for this visit. , BMI There is no height or weight on file to calculate BMI. GEN: Well nourished, well developed, in no acute distress  HEENT: normal  Neck: no JVD, carotid bruits, or masses Cardiac: ***RRR; no murmurs, rubs, or gallops,no edema  Respiratory:  clear to auscultation bilaterally, normal work of breathing GI: soft, nontender, nondistended, + BS MS: no deformity or atrophy  Skin: warm and dry, no rash Neuro:  Strength and sensation are intact Psych: euthymic mood, full affect   EKG:   The ekg ordered today demonstrates ***   Recent Labs: 06/11/2019: TSH 1.66 07/07/2019: ALT 15 10/17/2019: BUN 20; Creatinine, Ser 0.74; Hemoglobin 15.5; Platelets 168; Potassium 4.2; Sodium 141   Lipid Panel    Component Value Date/Time   CHOL 145 06/11/2019 0944   CHOL 133 02/18/2019 0836   TRIG 166.0 (H) 06/11/2019 0944   HDL 40.80 06/11/2019 0944   HDL 40 02/18/2019 0836   CHOLHDL 4 06/11/2019 0944   VLDL 33.2 06/11/2019 0944   LDLCALC 71 06/11/2019 0944   LDLCALC 63 02/18/2019 0836     Other studies Reviewed: Additional studies/ records that were reviewed today with results demonstrating: ***.   ASSESSMENT  AND PLAN:  1. AFib:  2. Hypertensive heart disease: 3. Obesity: 4. Hyperlipidemia: 5. OSA:  Continue CPAP.    Current medicines are reviewed at length with the patient today.  The patient concerns regarding his medicines were addressed.  The following changes have been made:  No change***  Labs/ tests ordered today include: *** No orders of the defined types were placed in this encounter.   Recommend 150 minutes/week of aerobic exercise Low fat, low carb, high fiber diet recommended  Disposition:   FU in ***   Signed, Larae Grooms, MD  02/26/2020 6:58 PM    [facility] [place], [place], [place]  [address] Phone: [phone]; Fax: [phone]

## 2020-02-27 ENCOUNTER — Encounter: Payer: Self-pay | Admitting: Interventional Cardiology

## 2020-02-27 ENCOUNTER — Other Ambulatory Visit: Payer: Self-pay

## 2020-02-27 ENCOUNTER — Ambulatory Visit: Payer: Medicare Other | Admitting: Interventional Cardiology

## 2020-02-27 VITALS — BP 116/80 | HR 67 | Ht 74.0 in | Wt 273.2 lb

## 2020-02-27 DIAGNOSIS — Z9989 Dependence on other enabling machines and devices: Secondary | ICD-10-CM

## 2020-02-27 DIAGNOSIS — I119 Hypertensive heart disease without heart failure: Secondary | ICD-10-CM

## 2020-02-27 DIAGNOSIS — I4819 Other persistent atrial fibrillation: Secondary | ICD-10-CM | POA: Diagnosis not present

## 2020-02-27 DIAGNOSIS — G4733 Obstructive sleep apnea (adult) (pediatric): Secondary | ICD-10-CM | POA: Diagnosis not present

## 2020-02-27 NOTE — Patient Instructions (Signed)

## 2020-02-27 NOTE — Progress Notes (Signed)
Cardiology Office Note   Date:  02/27/2020   ID:  Isaiah Jones, DOB 05/29/1950, MRN [id number]  PCP:  Vivi Barrack, MD    No chief complaint on file.  AFib  Wt Readings from Last 3 Encounters:  02/27/20 273 lb 3.2 oz (123.9 kg)  02/12/20 273 lb 12.8 oz (124.2 kg)  12/10/19 281 lb 3.2 oz (127.6 kg)       History of Present Illness: Isaiah Jones is a 69 y.o. male  with history of hyperlipidemia, hypertension and obesity. He also is treated for sleep apnea with CPAP. He was seen in our office back in March 2018 due to new onset atrial fibrillation. His pulmonologist found the irregular heartbeat. Atrial fibrillation was diagnosed. He has had some fatigue and intermittent palpitations over the past few months prior to diagnosis.  At his initial visit in our office in March 2018, he was started on Toprol-XL 50 mg daily for rate control, Xarelto 20 mg daily for stroke prevention. Cardioversion was considered as well. He was advised to lose weight.  Echocardiogram from March 2018 revealed: - Left ventricle: The cavity size was normal. Wall thickness was increased in a pattern of mild LVH. Systolic function was normal. The estimated ejection fraction was in the range of 55% to 60%. Wall motion was normal; there were no regional wall motion abnormalities. Doppler parameters are consistent with abnormal left ventricular relaxation (grade 1 diastolic dysfunction). - Left atrium: The atrium was mildly dilated. Volume/bsa, S: 37.4 ml/m^2.  He had his cardioversion but it was not successful. He has been maintained on rate control meds. He feels colder than he did before after starting anticoagulation.  He has had weight fluctuations.  He has some hip problems but was told he is too big for ortho surgery. He was 375 lbs at that time.   At the last visit, he was considering weight loss surgery.  It occurred in 3/21.  Then he had COVID.  Lost 70 lbs so far after  surgery.  Hip replacement in 6/21.  Dr. Rhona Raider.  Denies : Chest pain. Dizziness. Leg edema. Nitroglycerin use. Orthopnea. Palpitations. Paroxysmal nocturnal dyspnea. Shortness of breath. Syncope.   Occasional spikes in HR noted on fitbit, but asymptomatic.     Past Medical History:  Diagnosis Date  . Anxiety   . Arthritis, degenerative    hip,shoulders, hands, back  . 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 CHF (congestive heart failure) ([place])   . Colon polyps   . Complication of anesthesia   . Depression   . Dyslipidemia   . Dysrhythmia 2017   A fib  . Family history of adverse reaction to anesthesia    unaware  . GERD (gastroesophageal reflux disease)   . Glaucoma    early stage  . History of 2019 novel coronavirus disease (COVID-19) 06/2019  . History of kidney stones   . Hypertension   . OSA on CPAP    C-Pap  . Persistent atrial fibrillation ([place])    Echo 3/18: Mild LVH, EF 55-60, normal wall motion, grade 1 diastolic dysfunction, mild LAE  . Pneumonia    childhood  . PONV (postoperative nausea and vomiting)    after 1st knee surgery only  . Rhinitis, allergic     Past Surgical History:  Procedure Laterality Date  . arthroscopic knee surgery Bilateral 2003  . CARDIOVERSION N/A 08/10/2016   Procedure: CARDIOVERSION;  Surgeon: Pixie Casino, MD;  Location: [facility];  Service: Cardiovascular;  Laterality: N/A;  . COLONOSCOPY    . LAPAROSCOPIC GASTRIC SLEEVE RESECTION N/A 07/15/2019   Procedure: LAPAROSCOPIC GASTRIC SLEEVE RESECTION, Upper Endo, ERAS Pathway;  Surgeon: [name], MD;  Location: [facility]S;  Service: General;  Laterality: N/A;  . REPLACEMENT TOTAL KNEE Bilateral 2008  . TONSILLECTOMY    . TOTAL HIP ARTHROPLASTY Right 10/21/2019   Procedure: RIGHT TOTAL HIP ARTHROPLASTY ANTERIOR APPROACH;  Surgeon: Melrose Nakayama, MD;  Location: [facility];  Service: Orthopedics;  Laterality: Right;  Marland Kitchen VASECTOMY       Current  Outpatient Medications  Medication Sig Dispense Refill  . acetaminophen (TYLENOL) 500 MG tablet Take 500 mg by mouth every 6 (six) hours as needed (for pain.).     Marland Kitchen amLODipine (NORVASC) 5 MG tablet TAKE 1 TABLET DAILY 90 tablet 1  . amoxicillin (AMOXIL) 500 MG capsule Take 500 mg by mouth as needed (for dental visits).     Marland Kitchen azelastine (ASTELIN) 0.1 % nasal spray Place 2 sprays into both nostrils 2 (two) times daily. 30 mL 12  . Calcium Carb-Cholecalciferol (CALCIUM-VITAMIN D) 600-400 MG-UNIT TABS Take 1 tablet by mouth in the morning, at noon, and at bedtime.    . diphenhydramine-acetaminophen (TYLENOL PM) 25-500 MG TABS tablet Take 2 tablets by mouth at bedtime.     . fexofenadine (ALLEGRA) 180 MG tablet Take 180 mg by mouth daily as needed for allergies or rhinitis.    . Ginkgo Biloba 60 MG TABS Take 60 mg by mouth daily.    Marland Kitchen latanoprost (XALATAN) 0.005 % ophthalmic solution Place 1 drop into both eyes at bedtime.     . methocarbamol (ROBAXIN) 750 MG tablet Take 1 tablet (750 mg total) by mouth every 6 (six) hours as needed for muscle spasms. 40 tablet 1  . metoprolol succinate (TOPROL-XL) 50 MG 24 hr tablet Take 1 tablet daily with or immediately following a meal. ** DO NOT CRUSH **    (BETA BLOCKER). 90 tablet 3  . mometasone (NASONEX) 50 MCG/ACT nasal spray Place 2 sprays into the nose at bedtime.     . mometasone-formoterol (DULERA) 100-5 MCG/ACT AERO Inhale 2 puffs into the lungs in the morning and at bedtime. 1 each 0  . Multiple Vitamin (MULTIVITAMIN WITH MINERALS) TABS tablet Take 2 tablets by mouth daily. Bariatric Multivitamin    . Multiple Vitamins-Minerals (AIRBORNE PO) Take 1 tablet by mouth daily as needed (immune health).     . PENNSAID 2 % SOLN Apply 1 Pump topically 4 (four) times daily as needed (aching joints).   1  . rivaroxaban (XARELTO) 20 MG TABS tablet Take 20 mg by mouth daily with supper.    . rosuvastatin (CRESTOR) 20 MG tablet TAKE 1 TABLET DAILY 90 tablet 3  .  sertraline (ZOLOFT) 50 MG tablet TAKE 1 TABLET DAILY 90 tablet 0  . tadalafil (CIALIS) 10 MG tablet Take 1 tablet (10 mg total) by mouth daily as needed for erectile dysfunction. 30 tablet 0   No current facility-administered medications for this visit.    Allergies:   Patient has no known allergies.    Social History:  The patient  reports that he quit smoking about 18 years ago. His smoking use included cigarettes. He has never used smokeless tobacco. He reports previous alcohol use. He reports previous drug use.   Family History:  The patient's family history includes Alzheimer's disease in his mother; Arthritis in his father and mother; Heart attack in his  mother; Heart attack (age of onset: 62) in his father; Hyperlipidemia in his brother, father, and mother; Hypertension in his brother, father, and mother; Other in his brother.    ROS:  Please see the history of present illness.   Otherwise, review of systems are positive for occasional high HR.   All other systems are reviewed and negative.    PHYSICAL EXAM: VS:  BP 116/80   Pulse 67   Ht 6\' 2"  (1.88 m)   Wt 273 lb 3.2 oz (123.9 kg)   SpO2 94%   BMI 35.08 kg/m  , BMI Body mass index is 35.08 kg/m. GEN: Well nourished, well developed, in no acute distress  HEENT: normal  Neck: no JVD, carotid bruits, or masses Cardiac: irregularly irregular; no murmurs, rubs, or gallops,no edema  Respiratory:  clear to auscultation bilaterally, normal work of breathing GI: soft, nontender, nondistended, + BS MS: no deformity or atrophy  Skin: warm and dry, no rash Neuro:  Strength and sensation are intact Psych: euthymic mood, full affect   EKG:   The ekg ordered 10/02/19 demonstrates AFib, rate controlled   Recent Labs: 06/11/2019: TSH 1.66 07/07/2019: ALT 15 10/17/2019: BUN 20; Creatinine, Ser 0.74; Hemoglobin 15.5; Platelets 168; Potassium 4.2; Sodium 141   Lipid Panel    Component Value Date/Time   CHOL 145 06/11/2019 0944    CHOL 133 02/18/2019 0836   TRIG 166.0 (H) 06/11/2019 0944   HDL 40.80 06/11/2019 0944   HDL 40 02/18/2019 0836   CHOLHDL 4 06/11/2019 0944   VLDL 33.2 06/11/2019 0944   LDLCALC 71 06/11/2019 0944   LDLCALC 63 02/18/2019 0836     Other studies Reviewed: Additional studies/ records that were reviewed today with results demonstrating: labs reviewed.Hbg 15.5; Cr normal in June 2021   ASSESSMENT AND PLAN:  1. AFib: Fitbit is telling him there are some spikes of his HR to the 120s, but he is asymptomatic.  Xarelto for stroke prevention.  2. Hypertensive heart disease: The current medical regimen is effective;  continue present plan and medications.  He came off of valsartan and HCTZ.  3. Morbid Obesity: much improved.  Lost 10 lbs since 8/21.  70 lbs total since weigh tloss surgery.  Did well during that surgery.  I congratulated him on the weight loss.  He has done a great job sticking to the program of portion control and exercise. 4. Hyperlipidemia: LDL 71 in 2/21.  Continue rosuvastatin.  5. OSA:  Continue CPAP.  TOlerating well.  He is much more active since his weight loss and hip surgery.   Current medicines are reviewed at length with the patient today.  The patient concerns regarding his medicines were addressed.  The following changes have been made:  No change  Labs/ tests ordered today include:  No orders of the defined types were placed in this encounter.   Recommend 150 minutes/week of aerobic exercise Low fat, low carb, high fiber diet recommended  Disposition:   FU in 1 year   Signed, Larae Grooms, MD  02/27/2020 1:42 PM    [facility] [place], [place], [place]  [address] Phone: [phone]; Fax: [phone]

## 2020-03-15 ENCOUNTER — Other Ambulatory Visit: Payer: Self-pay | Admitting: *Deleted

## 2020-03-15 MED ORDER — METOPROLOL SUCCINATE ER 50 MG PO TB24: ORAL_TABLET | ORAL | 3 refills | Status: DC

## 2020-03-15 NOTE — Telephone Encounter (Signed)
Spoke with Dr. Irish Lack and he is okay with refilling metoprolol and Xarelto. Refill sent in for metoprolol. Will send refill request to the anticoag pool for refill for Xarelto.

## 2020-03-16 MED ORDER — RIVAROXABAN 20 MG PO TABS: 20.0000 mg | ORAL_TABLET | Freq: Every day | ORAL | 1 refills | Status: DC

## 2020-03-16 NOTE — Telephone Encounter (Signed)
Xarelto 20mg  refill request received. Pt is 69 years old, weight-123.9 kg, Crea-0.74 on 10/17/2019, last seen by Dr. Irish Lack on 02/27/2020, Diagnosis-2018-Afib and started Xarelto for Stroke Prevention and note from [place] on 03/15/2020 at 512pm: "Spoke with Dr. Irish Lack and he is okay with refilling metoprolol and Xarelto. Refill sent in for metoprolol. Will send refill request to the anticoag pool for refill for Xarelto", CrCl-162.69ml/min; Dose is appropriate based on dosing criteria. Will send in refill to requested pharmacy.

## 2020-03-22 ENCOUNTER — Other Ambulatory Visit: Payer: Self-pay | Admitting: Family Medicine

## 2020-04-12 ENCOUNTER — Other Ambulatory Visit: Payer: Self-pay | Admitting: Family Medicine

## 2020-06-02 ENCOUNTER — Other Ambulatory Visit: Payer: Self-pay | Admitting: Family Medicine

## 2020-06-11 ENCOUNTER — Ambulatory Visit: Payer: Medicare Other | Admitting: Family Medicine

## 2020-06-11 ENCOUNTER — Ambulatory Visit (INDEPENDENT_AMBULATORY_CARE_PROVIDER_SITE_OTHER): Payer: Medicare Other | Admitting: Family Medicine

## 2020-06-11 ENCOUNTER — Encounter: Payer: Self-pay | Admitting: Family Medicine

## 2020-06-11 ENCOUNTER — Other Ambulatory Visit: Payer: Self-pay

## 2020-06-11 VITALS — BP 120/72 | HR 56 | Temp 98.2°F | Ht 74.0 in | Wt 300.0 lb

## 2020-06-11 DIAGNOSIS — R739 Hyperglycemia, unspecified: Secondary | ICD-10-CM | POA: Diagnosis not present

## 2020-06-11 DIAGNOSIS — E785 Hyperlipidemia, unspecified: Secondary | ICD-10-CM | POA: Diagnosis not present

## 2020-06-11 DIAGNOSIS — Z125 Encounter for screening for malignant neoplasm of prostate: Secondary | ICD-10-CM

## 2020-06-11 DIAGNOSIS — E559 Vitamin D deficiency, unspecified: Secondary | ICD-10-CM

## 2020-06-11 DIAGNOSIS — R059 Cough, unspecified: Secondary | ICD-10-CM | POA: Diagnosis not present

## 2020-06-11 DIAGNOSIS — Z0001 Encounter for general adult medical examination with abnormal findings: Secondary | ICD-10-CM | POA: Diagnosis not present

## 2020-06-11 DIAGNOSIS — M109 Gout, unspecified: Secondary | ICD-10-CM | POA: Diagnosis not present

## 2020-06-11 DIAGNOSIS — N529 Male erectile dysfunction, unspecified: Secondary | ICD-10-CM

## 2020-06-11 DIAGNOSIS — I10 Essential (primary) hypertension: Secondary | ICD-10-CM

## 2020-06-11 DIAGNOSIS — I4819 Other persistent atrial fibrillation: Secondary | ICD-10-CM

## 2020-06-11 DIAGNOSIS — F325 Major depressive disorder, single episode, in full remission: Secondary | ICD-10-CM

## 2020-06-11 LAB — LDL CHOLESTEROL, DIRECT: Direct LDL: 76 mg/dL

## 2020-06-11 LAB — COMPREHENSIVE METABOLIC PANEL
ALT: 24 U/L (ref 0–53)
AST: 24 U/L (ref 0–37)
Albumin: 4.2 g/dL (ref 3.5–5.2)
Alkaline Phosphatase: 62 U/L (ref 39–117)
BUN: 12 mg/dL (ref 6–23)
CO2: 32 mEq/L (ref 19–32)
Calcium: 9.9 mg/dL (ref 8.4–10.5)
Chloride: 102 mEq/L (ref 96–112)
Creatinine, Ser: 0.87 mg/dL (ref 0.40–1.50)
GFR: 87.27 mL/min (ref >60.00)
Glucose, Bld: 90 mg/dL (ref 70–99)
Potassium: 4.7 mEq/L (ref 3.5–5.1)
Sodium: 141 mEq/L (ref 135–145)
Total Bilirubin: 0.7 mg/dL (ref 0.2–1.2)
Total Protein: 6.4 g/dL (ref 6.0–8.3)

## 2020-06-11 LAB — LIPID PANEL
Cholesterol: 152 mg/dL (ref 0–200)
HDL: 51.6 mg/dL (ref >39.00)
NonHDL: 100.86
Total CHOL/HDL Ratio: 3
Triglycerides: 203 mg/dL — ABNORMAL HIGH (ref 0.0–149.0)
VLDL: 40.6 mg/dL — ABNORMAL HIGH (ref 0.0–40.0)

## 2020-06-11 LAB — TSH: TSH: 0.84 u[IU]/mL (ref 0.35–4.50)

## 2020-06-11 LAB — CBC
HCT: 45.9 % (ref 39.0–52.0)
Hemoglobin: 15.5 g/dL (ref 13.0–17.0)
MCHC: 33.8 g/dL (ref 30.0–36.0)
MCV: 95.4 fl (ref 78.0–100.0)
Platelets: 167 10*3/uL (ref 150.0–400.0)
RBC: 4.81 Mil/uL (ref 4.22–5.81)
RDW: 13.9 % (ref 11.5–15.5)
WBC: 5.7 10*3/uL (ref 4.0–10.5)

## 2020-06-11 LAB — VITAMIN D 25 HYDROXY (VIT D DEFICIENCY, FRACTURES): VITD: 28.31 ng/mL — ABNORMAL LOW (ref 30.00–100.00)

## 2020-06-11 LAB — URIC ACID: Uric Acid, Serum: 7.3 mg/dL (ref 4.0–7.8)

## 2020-06-11 LAB — PSA: PSA: 0.1 ng/mL (ref 0.10–4.00)

## 2020-06-11 NOTE — Assessment & Plan Note (Signed)
Improving.  Does not qualify for lung cancer screening as he stopped smoking in 2003.  Insurance did not pay for [employer].  Given that symptoms have improved we will continue with watchful waiting for now.  Would consider referral to pulmonology for PFTs in the future if symptoms return or worsen.

## 2020-06-11 NOTE — Assessment & Plan Note (Addendum)
At goal on amlodipine 5 mg daily and metoprolol succinate 50 mg daily. 

## 2020-06-11 NOTE — Assessment & Plan Note (Signed)
Stable on Zoloft 50 mg daily. 

## 2020-06-11 NOTE — Progress Notes (Signed)
Chief Complaint:  Isaiah Jones is a 70 y.o. male who presents today for his annual comprehensive physical exam.    Assessment/Plan:  Chronic Problems Addressed Today: Cough Improving.  Does not qualify for lung cancer screening as he stopped smoking in 2003.  Insurance did not pay for [employer].  Given that symptoms have improved we will continue with watchful waiting for now.  Would consider referral to pulmonology for PFTs in the future if symptoms return or worsen.  Vitamin D deficiency Check vitamin D.  Morbid obesity ([place]) Continue lifestyle modifications.  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  Gout Check uric acid.  Essential hypertension At goal on amlodipine 5 mg daily and metoprolol succinate 50mg  daily.   Erectile dysfunction Stable.  Continue Cialis as needed.  Dyslipidemia Check lipids.  Continue Crestor 20 g daily.  Persistent atrial fibrillation ([place]) Follows with cardiology.  Anticoagulated on Xarelto and rate controlled on metoprolol.    Preventative Healthcare: Check labs.  Due for colonoscopy next year. Does not want vaccines.   Patient Counseling(The following topics were reviewed and/or handout was given):  -Nutrition: Stressed importance of moderation in sodium/caffeine intake, saturated fat and cholesterol, caloric balance, sufficient intake of fresh fruits, vegetables, and fiber.  -Stressed the importance of regular exercise.   -Substance Abuse: Discussed cessation/primary prevention of tobacco, alcohol, or other drug use; driving or other dangerous activities under the influence; availability of treatment for abuse.   -Injury prevention: Discussed safety belts, safety helmets, smoke detector, smoking near bedding or upholstery.   -Sexuality: Discussed sexually transmitted diseases, partner selection, use of condoms, avoidance of unintended pregnancy and contraceptive alternatives.   -Dental health:  Discussed importance of regular tooth brushing, flossing, and dental visits.  -Health maintenance and immunizations reviewed. Please refer to Health maintenance section.  Return to care in 1 year for next preventative visit.     Subjective:  HPI:  He has no acute complaints today.   Lifestyle Diet: None specific.  Exercise: Limited.   Depression screen [facility] 2/9 08/26/2019  Decreased Interest 0  Down, Depressed, Hopeless 0  PHQ - 2 Score 0  Altered sleeping -  Tired, decreased energy -  Change in appetite -  Feeling bad or failure about yourself  -  Trouble concentrating -  Moving slowly or fidgety/restless -  Suicidal thoughts -  PHQ-9 Score -  Difficult doing work/chores -    There are no preventive care reminders to display for this patient.   ROS: Per HPI, otherwise a complete review of systems was negative.   PMH:  The following were reviewed and entered/updated in epic: Past Medical History:  Diagnosis Date  . Anxiety   . Arthritis, degenerative    hip,shoulders, hands, back  . 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 CHF (congestive heart failure) ([place])   . Colon polyps   . Complication of anesthesia   . Depression   . Dyslipidemia   . Dysrhythmia 2017   A fib  . Family history of adverse reaction to anesthesia    unaware  . GERD (gastroesophageal reflux disease)   . Glaucoma    early stage  . History of 2019 novel coronavirus disease (COVID-19) 06/2019  . History of kidney stones   . Hypertension   . OSA on CPAP    C-Pap  . Persistent atrial fibrillation ([place])    Echo 3/18: Mild LVH, EF 55-60, normal wall motion, grade  1 diastolic dysfunction, mild LAE  . Pneumonia    childhood  . PONV (postoperative nausea and vomiting)    after 1st knee surgery only  . Rhinitis, allergic    Patient Active Problem List   Diagnosis Date Noted  . Cough 02/12/2020  . Former smoker 12/10/2019  . Primary localized osteoarthritis of  right hip 10/21/2019  . Primary osteoarthritis of right hip 10/21/2019  . 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 Vitamin D deficiency 06/16/2019  . Erectile dysfunction 12/05/2018  . Essential hypertension 12/05/2018  . Gout 12/05/2018  . Osteoarthritis 12/05/2018  . Seasonal allergies 12/05/2018  . 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 Morbid obesity ([place]) 12/05/2018  . Persistent atrial fibrillation ([place])   . OSA on CPAP   . Dyslipidemia    Past Surgical History:  Procedure Laterality Date  . arthroscopic knee surgery Bilateral 2003  . CARDIOVERSION N/A 08/10/2016   Procedure: CARDIOVERSION;  Surgeon: Pixie Casino, MD;  Location: [facility];  Service: Cardiovascular;  Laterality: N/A;  . COLONOSCOPY    . LAPAROSCOPIC GASTRIC SLEEVE RESECTION N/A 07/15/2019   Procedure: LAPAROSCOPIC GASTRIC SLEEVE RESECTION, Upper Endo, ERAS Pathway;  Surgeon: [name], MD;  Location: [facility]S;  Service: General;  Laterality: N/A;  . REPLACEMENT TOTAL KNEE Bilateral 2008  . TONSILLECTOMY    . TOTAL HIP ARTHROPLASTY Right 10/21/2019   Procedure: RIGHT TOTAL HIP ARTHROPLASTY ANTERIOR APPROACH;  Surgeon: Melrose Nakayama, MD;  Location: [facility];  Service: Orthopedics;  Laterality: Right;  Marland Kitchen VASECTOMY      Family History  Problem Relation Age of Onset  . Alzheimer's disease Mother   . Heart attack Mother   . Hyperlipidemia Mother   . Hypertension Mother   . Arthritis Mother   . Heart attack Father 70  . Arthritis Father   . Hyperlipidemia Father   . Hypertension Father   . Hypertension Brother   . Hyperlipidemia Brother   . Other Brother        [place]  . Atrial fibrillation Neg Hx     Medications- reviewed and updated Current Outpatient Medications  Medication Sig Dispense Refill  . acetaminophen (TYLENOL) 500 MG tablet Take 500 mg by mouth every 6 (six) hours as needed (for pain.).     Marland Kitchen amLODipine (NORVASC) 5 MG tablet TAKE 1 TABLET  DAILY 90 tablet 1  . amoxicillin (AMOXIL) 500 MG capsule Take 500 mg by mouth as needed (for dental visits).     Marland Kitchen azelastine (ASTELIN) 0.1 % nasal spray Place 2 sprays into both nostrils 2 (two) times daily. 30 mL 12  . Calcium Carb-Cholecalciferol (CALCIUM-VITAMIN D) 600-400 MG-UNIT TABS Take 1 tablet by mouth in the morning, at noon, and at bedtime.    . diphenhydramine-acetaminophen (TYLENOL PM) 25-500 MG TABS tablet Take 2 tablets by mouth at bedtime.     . fexofenadine (ALLEGRA) 180 MG tablet Take 180 mg by mouth daily as needed for allergies or rhinitis.    . Ginkgo Biloba 60 MG TABS Take 60 mg by mouth daily.    Marland Kitchen latanoprost (XALATAN) 0.005 % ophthalmic solution Place 1 drop into both eyes at bedtime.     . methocarbamol (ROBAXIN) 750 MG tablet Take 1 tablet (750 mg total) by mouth every 6 (six) hours as needed for muscle spasms. 40 tablet 1  . metoprolol succinate (TOPROL-XL) 50 MG 24 hr tablet Take 1 tablet daily with or immediately following a meal. ** DO NOT CRUSH **    (  BETA BLOCKER). 90 tablet 3  . mometasone (NASONEX) 50 MCG/ACT nasal spray Place 2 sprays into the nose at bedtime.     . Multiple Vitamin (MULTIVITAMIN WITH MINERALS) TABS tablet Take 2 tablets by mouth daily. Bariatric Multivitamin    . Multiple Vitamins-Minerals (AIRBORNE PO) Take 1 tablet by mouth daily as needed (immune health).     . PENNSAID 2 % SOLN Apply 1 Pump topically 4 (four) times daily as needed (aching joints).   1  . rivaroxaban (XARELTO) 20 MG TABS tablet Take 1 tablet (20 mg total) by mouth daily with supper. 90 tablet 1  . rosuvastatin (CRESTOR) 20 MG tablet TAKE 1 TABLET DAILY 90 tablet 3  . sertraline (ZOLOFT) 50 MG tablet TAKE 1 TABLET DAILY 90 tablet 0  . tadalafil (CIALIS) 10 MG tablet Take 1 tablet (10 mg total) by mouth daily as needed for erectile dysfunction. 30 tablet 0   No current facility-administered medications for this visit.    Allergies-reviewed and updated No Known  Allergies  Social History   Socioeconomic History  . Marital status: Married    Spouse name: Not on file  . Number of children: Not on file  . Years of education: Not on file  . Highest education level: Not on file  Occupational History  . Occupation: Retired   Tobacco Use  . Smoking status: Former Smoker    Types: Cigarettes    Quit date: 07/04/2001    Years since quitting: 18.9  . Smokeless tobacco: Never Used  Vaping Use  . Vaping Use: Never used  Substance and Sexual Activity  . Alcohol use: Not Currently    Comment: OCCASIONALLY  . Drug use: Not Currently  . Sexual activity: Yes    Comment: MARRIED  Other Topics Concern  . Not on file  Social History Narrative   Lives in [place], [place]   Married   2 kids   Retired - Prior worked at [employer] of [employer]: Not on [employer] Insecurity: Not on file  Transportation Needs: Not on file  Physical Activity: Not on file  Stress: Not on file  Social Connections: Not on file        Objective:  Physical Exam: BP 120/72   Pulse (!) 56   Temp 98.2 F (36.8 C) (Temporal)   Ht 6\' 2"  (1.88 m)   Wt 300 lb (136.1 kg)   SpO2 98%   BMI 38.52 kg/m   Body mass index is 38.52 kg/m. Wt Readings from Last 3 Encounters:  06/11/20 300 lb (136.1 kg)  02/27/20 273 lb 3.2 oz (123.9 kg)  02/12/20 273 lb 12.8 oz (124.2 kg)   Gen: NAD, resting comfortably HEENT: TMs normal bilaterally. OP clear. No thyromegaly noted.  CV: RRR with no murmurs appreciated Pulm: NWOB, CTAB with no crackles, wheezes, or rhonchi GI: Normal bowel sounds present. Soft, Nontender, Nondistended. MSK: no edema, cyanosis, or clubbing noted Skin: warm, dry Neuro: CN2-12 grossly intact. Strength 5/5 in upper and lower extremities. Reflexes symmetric and intact bilaterally.  Psych: Normal affect and thought content     Santresa Levett M. Jerline Pain, MD 06/11/2020 9:37 AM

## 2020-06-11 NOTE — Patient Instructions (Signed)
It was very nice to see you today!  We will check blood work today.  I will see you back in year for your next annual physical but please come back to see me sooner if anything else comes up.  Take care, Dr Jerline Pain  Please try these tips to maintain a healthy lifestyle:   Eat at least 3 REAL meals and 1-2 snacks per day.  Aim for no more than 5 hours between eating.  If you eat breakfast, please do so within one hour of getting up.    Each meal should contain half fruits/vegetables, one quarter protein, and one quarter carbs (no bigger than a computer mouse)   Cut down on sweet beverages. This includes juice, soda, and sweet tea.     Drink at least 1 glass of water with each meal and aim for at least 8 glasses per day   Exercise at least 150 minutes every week.    Preventive Care 21 Years and Older, Male Preventive care refers to lifestyle choices and visits with your health care provider that can promote health and wellness. This includes:  A yearly physical exam. This is also called an annual wellness visit.  Regular dental and eye exams.  Immunizations.  Screening for certain conditions.  Healthy lifestyle choices, such as: ? Eating a healthy diet. ? Getting regular exercise. ? Not using drugs or products that contain nicotine and tobacco. ? Limiting alcohol use. What can I expect for my preventive care visit? Physical exam Your health care provider will check your:  Height and weight. These may be used to calculate your BMI (body mass index). BMI is a measurement that tells if you are at a healthy weight.  Heart rate and blood pressure.  Body temperature.  Skin for abnormal spots. Counseling Your health care provider may ask you questions about your:  Past medical problems.  Family's medical history.  Alcohol, tobacco, and drug use.  Emotional well-being.  Home life and relationship well-being.  Sexual activity.  Diet, exercise, and sleep  habits.  History of falls.  Memory and ability to understand (cognition).  Work and work Statistician.  Access to firearms. What immunizations do I need? Vaccines are usually given at various ages, according to a schedule. Your health care provider will recommend vaccines for you based on your age, medical history, and lifestyle or other factors, such as travel or where you work.   What tests do I need? Blood tests  Lipid and cholesterol levels. These may be checked every 5 years, or more often depending on your overall health.  Hepatitis C test.  Hepatitis B test. Screening  Lung cancer screening. You may have this screening every year starting at age 47 if you have a 30-pack-year history of smoking and currently smoke or have quit within the past 15 years.  Colorectal cancer screening. ? All adults should have this screening starting at age 70 and continuing until age 87. ? Your health care provider may recommend screening at age 62 if you are at increased risk. ? You will have tests every 1-10 years, depending on your results and the type of screening test.  Prostate cancer screening. Recommendations will vary depending on your family history and other risks.  Genital exam to check for testicular cancer or hernias.  Diabetes screening. ? This is done by checking your blood sugar (glucose) after you have not eaten for a while (fasting). ? You may have this done every 1-3 years.  Abdominal  aortic aneurysm (AAA) screening. You may need this if you are a current or former smoker.  STD (sexually transmitted disease) testing, if you are at risk. Follow these instructions at home: Eating and drinking  Eat a diet that includes fresh fruits and vegetables, whole grains, lean protein, and low-fat dairy products. Limit your intake of foods with high amounts of sugar, saturated fats, and salt.  Take vitamin and mineral supplements as recommended by your health care provider.  Do  not drink alcohol if your health care provider tells you not to drink.  If you drink alcohol: ? Limit how much you have to 0-2 drinks a day. ? Be aware of how much alcohol is in your drink. In the [place] one drink equals one 12 oz bottle of beer (355 mL), one 5 oz glass of wine (148 mL), or one 1 oz glass of hard liquor (44 mL).   Lifestyle  Take daily care of your teeth and gums. Brush your teeth every morning and night with fluoride toothpaste. Floss one time each day.  Stay active. Exercise for at least 30 minutes 5 or more days each week.  Do not use any products that contain nicotine or tobacco, such as cigarettes, e-cigarettes, and chewing tobacco. If you need help quitting, ask your health care provider.  Do not use drugs.  If you are sexually active, practice safe sex. Use a condom or other form of protection to prevent STIs (sexually transmitted infections).  Talk with your health care provider about taking a low-dose aspirin or statin.  Find healthy ways to cope with stress, such as: ? Meditation, yoga, or listening to music. ? Journaling. ? Talking to a trusted person. ? Spending time with friends and family. Safety  Always wear your seat belt while driving or riding in a vehicle.  Do not drive: ? If you have been drinking alcohol. Do not ride with someone who has been drinking. ? When you are tired or distracted. ? While texting.  Wear a helmet and other protective equipment during sports activities.  If you have firearms in your house, make sure you follow all gun safety procedures. What's next?  Visit your health care provider once a year for an annual wellness visit.  Ask your health care provider how often you should have your eyes and teeth checked.  Stay up to date on all vaccines. This information is not intended to replace advice given to you by your health care provider. Make sure you discuss any questions you have with your health care  provider. Document Revised: 01/14/2019 Document Reviewed: 04/11/2018 Elsevier Patient Education  2021 [employer].

## 2020-06-11 NOTE — Assessment & Plan Note (Signed)
Stable.  Continue Cialis as needed.

## 2020-06-11 NOTE — Assessment & Plan Note (Signed)
Check uric acid. 

## 2020-06-11 NOTE — Assessment & Plan Note (Signed)
Follows with cardiology.  Anticoagulated on Xarelto and rate controlled on metoprolol.

## 2020-06-11 NOTE — Assessment & Plan Note (Signed)
Continue lifestyle modifications. 

## 2020-06-11 NOTE — Assessment & Plan Note (Signed)
Check lipids.  Continue Crestor 20 g daily.

## 2020-06-11 NOTE — Assessment & Plan Note (Signed)
Check vitamin D. 

## 2020-06-14 LAB — HEMOGLOBIN A1C: Hgb A1c MFr Bld: 5.5 % (ref 4.6–6.5)

## 2020-06-14 NOTE — Progress Notes (Signed)
Please inform patient of the following:  Labs are all STABLE. Do not need to make any changes to his treatment plan at this time. We can recheck in a year.  Algis Greenhouse. Jerline Pain, MD 06/14/2020 9:19 AM

## 2020-09-05 ENCOUNTER — Other Ambulatory Visit: Payer: Self-pay | Admitting: Family Medicine

## 2020-10-25 ENCOUNTER — Other Ambulatory Visit: Payer: Self-pay | Admitting: Interventional Cardiology

## 2020-10-25 DIAGNOSIS — I4819 Other persistent atrial fibrillation: Secondary | ICD-10-CM

## 2020-10-26 NOTE — Telephone Encounter (Signed)
Xarelto 20mg  refill request received. Pt is 70 years old, weight-136.1kg, Crea-0.87 on 06/11/2020, last seen by Dr. Irish Lack on 02/27/2020, Diagnosis-Afib, CrCl-149.65ml/min; Dose is appropriate based on dosing criteria. Will send in refill to requested pharmacy.

## 2020-11-07 IMAGING — DX DG CHEST 2V
2 series · 2 of 2 positions shown · non-contrast
Comparison: Chest x-ray 10/03/2018.

CLINICAL DATA: 69-year-old male under preoperative evaluation prior
to hip surgery.

EXAM:
CHEST - 2 VIEW

[chest pa]
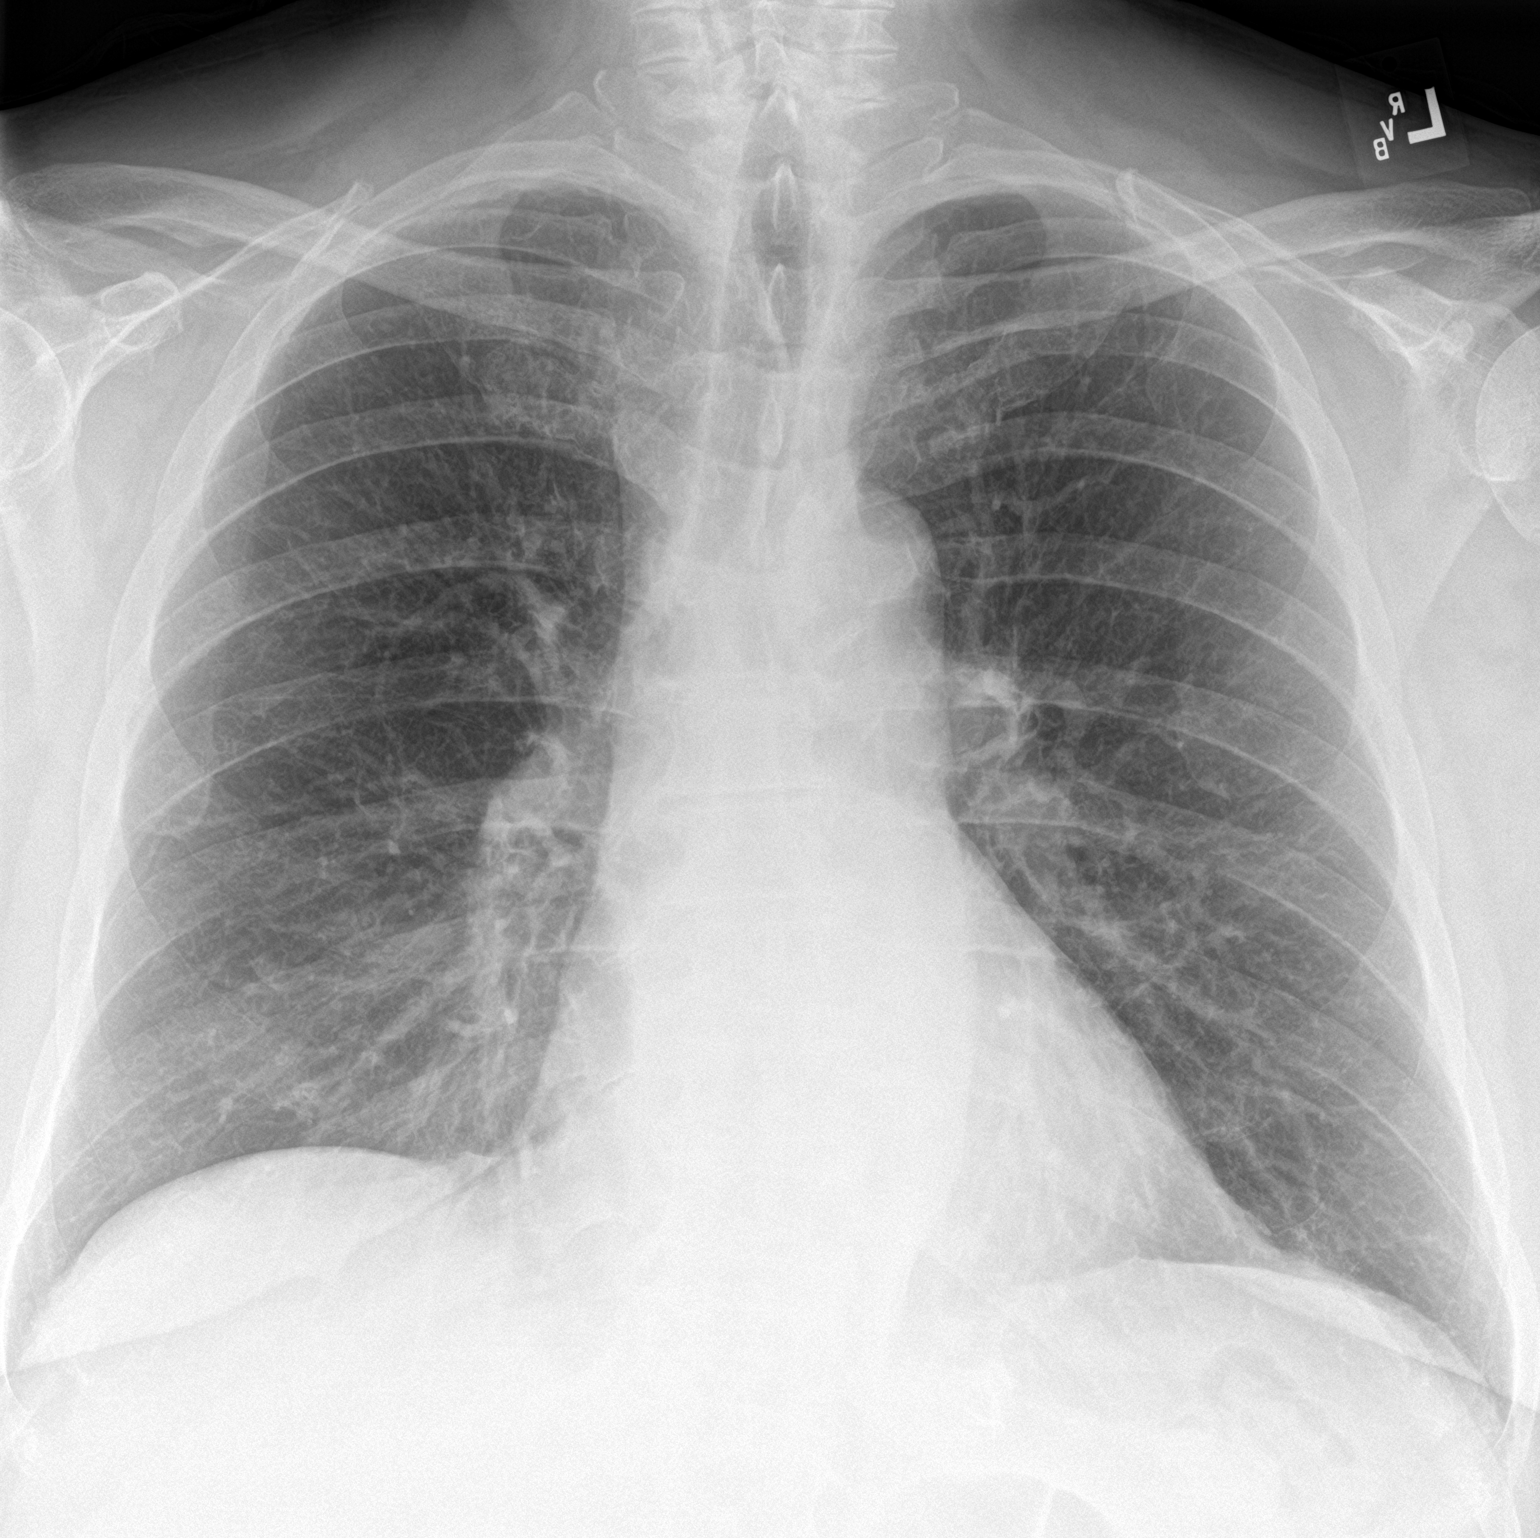

[chest lat]
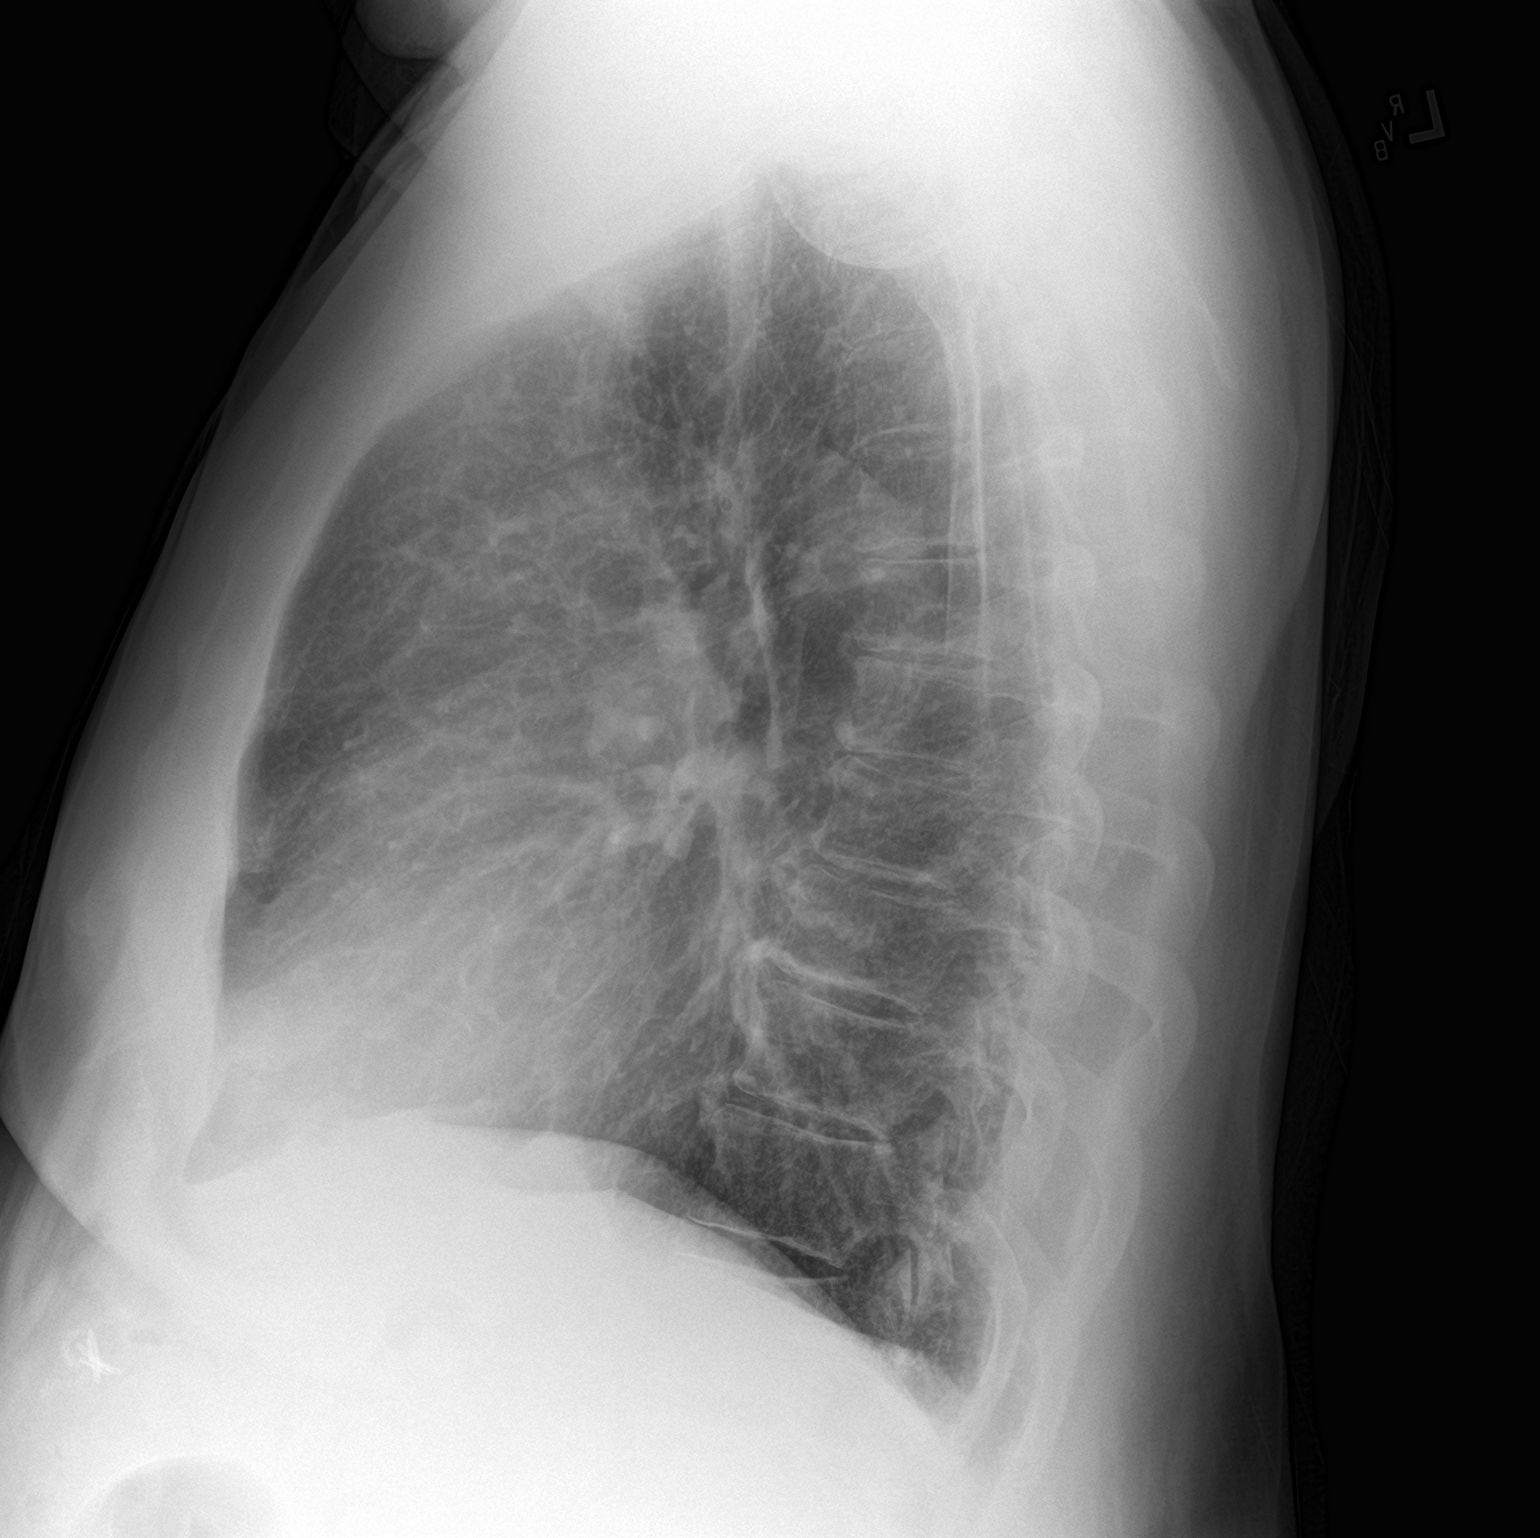

[2 of 2 positions shown; findings below may reference images not displayed]

FINDINGS: Lung volumes are normal. No consolidative airspace disease. No
pleural effusions. No pneumothorax. No pulmonary nodule or mass
noted. Pulmonary vasculature and the cardiomediastinal silhouette
are within normal limits. Atherosclerosis in the thoracic aorta.
IMPRESSION: 1.  No radiographic evidence of acute cardiopulmonary disease.
2. Aortic atherosclerosis.

## 2020-12-20 ENCOUNTER — Other Ambulatory Visit: Payer: Self-pay

## 2020-12-20 ENCOUNTER — Ambulatory Visit: Payer: Medicare Other | Admitting: Family Medicine

## 2020-12-20 ENCOUNTER — Telehealth: Payer: Self-pay

## 2020-12-20 ENCOUNTER — Encounter: Payer: Self-pay | Admitting: Family Medicine

## 2020-12-20 VITALS — BP 128/65 | HR 57 | Temp 97.3°F | Ht 74.0 in | Wt 293.4 lb

## 2020-12-20 DIAGNOSIS — I10 Essential (primary) hypertension: Secondary | ICD-10-CM

## 2020-12-20 DIAGNOSIS — H9190 Unspecified hearing loss, unspecified ear: Secondary | ICD-10-CM | POA: Diagnosis not present

## 2020-12-20 DIAGNOSIS — H9319 Tinnitus, unspecified ear: Secondary | ICD-10-CM

## 2020-12-20 DIAGNOSIS — I4819 Other persistent atrial fibrillation: Secondary | ICD-10-CM | POA: Diagnosis not present

## 2020-12-20 NOTE — Telephone Encounter (Signed)
Pt called in stating that Dr Jerline Pain referred him to Dr Lucia Gaskins. Pt called Dr Pollie Friar office to get scheduled and the office told him that he is retiring in October. Pt wants to know if there is someone else he can be referred to. Please Advise.

## 2020-12-20 NOTE — Assessment & Plan Note (Signed)
At goal on amlodipine 5 mg daily and metoprolol succinate 50 mg daily.

## 2020-12-20 NOTE — Patient Instructions (Signed)
It was very nice to see you today!  You have fluid buildup behind your ears.  I think this is causing most of your symptoms.  We will place a referral to an ear nose and throat doctor for them to take a closer look.  No other changes today.   Take care, Dr Jerline Pain  PLEASE NOTE:  If you had any lab tests please let us know if you have not heard back within a few days. You may see your results on mychart before we have a chance to review them but we will give you a call once they are reviewed by [place]. If we ordered any referrals today, please let us know if you have not heard from their office within the next week.   Please try these tips to maintain a healthy lifestyle:  Eat at least 3 REAL meals and 1-2 snacks per day.  Aim for no more than 5 hours between eating.  If you eat breakfast, please do so within one hour of getting up.   Each meal should contain half fruits/vegetables, one quarter protein, and one quarter carbs (no bigger than a computer mouse)  Cut down on sweet beverages. This includes juice, soda, and sweet tea.   Drink at least 1 glass of water with each meal and aim for at least 8 glasses per day  Exercise at least 150 minutes every week.
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
## 2020-12-20 NOTE — Assessment & Plan Note (Signed)
Follows with cardiology.  Anticoagulated on Xarelto and rate controlled on metoprolol.

## 2020-12-22 NOTE — Telephone Encounter (Signed)
Can his referral be sent elsewhere please

## 2020-12-23 ENCOUNTER — Ambulatory Visit (INDEPENDENT_AMBULATORY_CARE_PROVIDER_SITE_OTHER): Payer: Medicare Other

## 2020-12-23 ENCOUNTER — Telehealth: Payer: Self-pay | Admitting: Interventional Cardiology

## 2020-12-23 DIAGNOSIS — R001 Bradycardia, unspecified: Secondary | ICD-10-CM

## 2020-12-23 DIAGNOSIS — I4819 Other persistent atrial fibrillation: Secondary | ICD-10-CM

## 2020-12-23 NOTE — Telephone Encounter (Signed)
Spoke with pt. He states when he works out he uses a Advertising copywriter. He states he averages between 110 and 120 bpm while exercising. During his workout, his heart rate will drop down into the 40's several times. This lasts for about 10 seconds then his heart rate will go back up to 110's. He denies any dizziness, SOB, CP during these episodes.  I advised him I would forward to Dr. Irish Lack for recommendation and contact him back.

## 2020-12-23 NOTE — Telephone Encounter (Signed)
Pt called back to check the status of the referral being changed?

## 2020-12-23 NOTE — Telephone Encounter (Signed)
Per Dr. Hassell Done verbal order, place patient on 14 day monitor for evaluation.  Pt agrees with this plan and had no additional questions.

## 2020-12-23 NOTE — Telephone Encounter (Signed)
Patient c/o Palpitations:  High priority if patient c/o lightheadedness, shortness of breath, or chest pain  How long have you had palpitations/irregular HR/ Afib? Are you having the symptoms now? no  Are you currently experiencing lightheadedness, SOB or CP? no  Do you have a history of afib (atrial fibrillation) or irregular heart rhythm? yes  Have you checked your BP or HR? (document readings if available): no  Are you experiencing any other symptoms? Patient states while he is working out his hr drops to the low 40 throughout his workout. He normally rides a bike for exercise

## 2020-12-23 NOTE — Progress Notes (Unsigned)
Patient enrolled for [employer] to ship a 14 day ZIO AT Long Term Monitor-Live Telemetry to his address on file. First AT monitor fell off in one day. Replacement ZIO AT serial # [id number] applied in office using tincture of benzoin.

## 2020-12-26 ENCOUNTER — Telehealth: Payer: Self-pay | Admitting: Physician Assistant

## 2020-12-26 DIAGNOSIS — R001 Bradycardia, unspecified: Secondary | ICD-10-CM

## 2020-12-26 DIAGNOSIS — I4819 Other persistent atrial fibrillation: Secondary | ICD-10-CM | POA: Diagnosis not present

## 2020-12-26 NOTE — Telephone Encounter (Signed)
Afib 10:17 am ET for 90s sec     Cardiac Monitor Alert  Date of alert:  12/26/2020   Patient Name: Isaiah Jones  DOB: 07/13/1950  MRN: [id number]   [place] Cardiologist: Larae Grooms, MD  [facility] EP:  None    Monitor Information: Long Term Monitor-Live Telemetry [ZioAT]  Reason: Fluctuating heart rate Per telephone note Ordering provider: Dr. Irish Lack   Alert Atrial Fibrillation/Flutter This is the 1st alert for this rhythm.  Patient had 90 seconds of atrial fibrillation at heart rate of 40 to 80s.  This was occurred today at 18 Eastern time.  The patient has a hx of Atrial Fibrillation/Flutter.  The patient is not currently on anticoagulation. Anticoagulation medication as of 12/26/2020           XARELTO 20 MG TABS tablet TAKE 1 TABLET DAILY WITH   SUPPER       Next Cardiology Appointment { Not scheduled yet  The patient could NOT be reached by telephone today.  I have called patient on home and mobile number to review symptoms at event but did not pick up phone.  No voicemail left.   Will forward to Dr. Irish Lack  Other: None  Leanor Kail, [place]  12/26/2020 3:38 PM

## 2020-12-27 ENCOUNTER — Telehealth: Payer: Self-pay | Admitting: *Deleted

## 2020-12-27 NOTE — Telephone Encounter (Signed)
   Pt said he sweats yesterday and it ruined the adhesive on his heart monitor. He wants to know what he needs to do, if he needs to send it back

## 2020-12-27 NOTE — Telephone Encounter (Signed)
Contacted [employer], they will have a replacement monitor mailed to the patients home.  Explained the importance of not taking a shower or sweating a lot during first 24 hours.  Patient had gone to a funeral and had to walk a considerable distance to [place] causing him to sweat a lot. If monitor comes by Thursday, patient  will call and come into our [facility] office to have the monitor applied.  We will use Tincture of Benzoin on area before applying ZIO patch.   If monitor has not come by Thursday, patient will arrange to pick up Tinture of benzoin at our office to use when he applys the monitor himself.

## 2020-12-29 NOTE — Telephone Encounter (Signed)
Isaiah Jones is calling stating he received the monitor and is wanting a callback to setup a day and time for him to get it placed per what Shelly advised. Please advise.

## 2020-12-29 NOTE — Telephone Encounter (Signed)
Patient scheduled to have replacement ZIO AT applied in office using tincture of benzoin.  Do not link original order to appointment.  Do not cancel original appointment.  Results will drop  with [employer] EMR integration to original order on monitor status report.

## 2020-12-30 ENCOUNTER — Encounter: Payer: Self-pay | Admitting: Interventional Cardiology

## 2020-12-30 ENCOUNTER — Other Ambulatory Visit: Payer: Self-pay

## 2020-12-30 ENCOUNTER — Telehealth: Payer: Self-pay | Admitting: Interventional Cardiology

## 2020-12-30 NOTE — Telephone Encounter (Signed)
Kelly from [place] calling with abnormal zio patch results.

## 2020-12-30 NOTE — Telephone Encounter (Signed)
AFib is known. COntinue Xarelto.  No bradycardia noted.   JV   New monitor applied as previous monitor fell off.  This is first reading from new monitor.

## 2020-12-30 NOTE — Telephone Encounter (Signed)
Isaiah Jones from [place] with Zio notification:   -1st documentation of AF with a rate of 79bpm for 90 seconds occurring 9/1 @ 10:38.  [employer] will fax rhythm when it posts later today.   Will forward to MD and his nurse for review.

## 2021-01-04 ENCOUNTER — Telehealth: Payer: Self-pay | Admitting: Interventional Cardiology

## 2021-01-04 MED ORDER — METOPROLOL SUCCINATE ER 25 MG PO TB24: ORAL_TABLET | ORAL | 0 refills | Status: DC

## 2021-01-04 NOTE — Telephone Encounter (Addendum)
Isaiah Jones with [employer] called to report- Zio monitor showed on 01/04/21 at 4:30 am that patient's rhythm was a slow atrial fib. HR 36 for 60 seconds. No other recordings reported.   Called patient and left message for patient to call back.

## 2021-01-04 NOTE — Telephone Encounter (Signed)
Kelly from [facility] calling with abnormal EKG results.

## 2021-01-04 NOTE — Telephone Encounter (Signed)
Patient called back. Patient stated he was sleeping at that time. Consulted, Dr. Radford Pax, DOD, she recommends that Dr. Irish Lack review and decrease patient's metoprolol from 50 mg to 37.5 mg daily. Will put report in Dr. Hassell Done box. Patient agreed to plan.

## 2021-01-06 NOTE — Telephone Encounter (Signed)
Not as concerned about slow HR since he was asleep.  Continue to monitor for HR spikes on the lower dose of metoprolol and we will adjust accordingly.   Jettie Booze, MD

## 2021-01-10 ENCOUNTER — Telehealth: Payer: Self-pay | Admitting: *Deleted

## 2021-01-10 NOTE — Telephone Encounter (Signed)
Patient received a call from [facility] today stating they are sending him a 3rd monitor.  Patient is questioning why. We received an email from [facility] as follows:  This is a Psychologist, forensic to inform you that the following ZIO AT device is approaching the maximum threshold for asymptomatic transmissions.  We have ordered a replacement to be sent to the patient with overnight delivery.  We have about 10 days of data collected so far.   This information was relayed to patient.  He will apply the 3rd monitor when it comes, but is only going to wear the monitor thru Thursday, as that is his 14th monitoring day.

## 2021-01-11 ENCOUNTER — Telehealth: Payer: Self-pay | Admitting: Interventional Cardiology

## 2021-01-11 NOTE — Telephone Encounter (Signed)
Isaiah Jones from [facility] is calling to report a critical EKG.

## 2021-01-11 NOTE — Telephone Encounter (Signed)
First documentation of AF 59-80 bpm for 90 sec today at 2:28 pm CST.  Auto detected.  Per previous documentation 12/30/20-- Afib is known.    See previous note for more details.

## 2021-01-15 ENCOUNTER — Other Ambulatory Visit: Payer: Self-pay | Admitting: Family Medicine

## 2021-01-21 ENCOUNTER — Telehealth: Payer: Self-pay | Admitting: Interventional Cardiology

## 2021-01-21 NOTE — Telephone Encounter (Signed)
Not as concerned since this was presumably during sleep.

## 2021-01-21 NOTE — Telephone Encounter (Signed)
      Cardiac Monitor Alert  Date of alert:  01/21/2021   Patient Name: Isaiah Jones  DOB: [birth year]  MRN: [id number]   [place] Cardiologist: Larae Grooms, MD  [facility] EP:  None    Monitor Information: Long Term Monitor-Live Telemetry [ZioAT]  Reason:  Palpitations  Ordering provider:  Irish Lack :1}  Alert Pause(s) - Longest:  4 seconds This is the 1st alert for this rhythm.   Next Cardiology Appointment   Date:  01/24/21  Provider:  Irish Lack  Arrhythmia, symptoms and history reviewed with  Previous Note: Not as concerned about slow HR since he was asleep.  Continue to monitor for HR spikes on the lower dose of metoprolol and we will adjust accordingly.    Jettie Booze, MD  Other: Sept 4 2:45 am 4 sec pause, Sept 6 3:01 am 4 sec pause, monitor should be up loaded soon for viewing.   Darrell Jewel, RN  01/21/2021 9:51 AM

## 2021-01-21 NOTE — Telephone Encounter (Signed)
Isaiah Jones from [place] calling with additional findings.

## 2021-01-23 NOTE — Progress Notes (Signed)
Cardiology Office Note   Date:  01/24/2021   ID:  Isaiah Jones, DOB 09-13-1950, MRN [id number]  PCP:  Vivi Barrack, MD    No chief complaint on file.  PAF  Wt Readings from Last 3 Encounters:  01/24/21 295 lb 9.6 oz (134.1 kg)  12/20/20 293 lb 6.4 oz (133.1 kg)  06/11/20 300 lb (136.1 kg)       History of Present Illness: Isaiah Jones is a 70 y.o. male    with history of hyperlipidemia, hypertension and obesity. He also is treated for sleep apnea with CPAP. He was seen in our office back in March 2018 due to new onset atrial fibrillation. His pulmonologist found the irregular heartbeat. Atrial fibrillation was diagnosed. He has had some fatigue and intermittent palpitations over the past few months prior to diagnosis.   At his initial visit in our office in March 2018, he was started on Toprol-XL 50 mg daily for rate control, Xarelto 20 mg daily for stroke prevention. Cardioversion was considered as well. He was advised to lose weight.   Echocardiogram from March 2018 revealed: - Left ventricle: The cavity size was normal. Wall thickness was   increased in a pattern of mild LVH. Systolic function was normal.   The estimated ejection fraction was in the range of 55% to 60%.   Wall motion was normal; there were no regional wall motion   abnormalities. Doppler parameters are consistent with abnormal   left ventricular relaxation (grade 1 diastolic dysfunction). - Left atrium: The atrium was mildly dilated. Volume/bsa, S: 37.4   ml/m^2.   He had his cardioversion but it was not successful.  He has been maintained on rate control meds. He feels colder than he did before after starting anticoagulation.   He has had weight fluctuations.  He has some hip problems but was told he is too big for ortho surgery.  He was 375 lbs at that time.    At the last visit, he was considering weight loss surgery.  It occurred in 3/21.  Then he had COVID.  Lost 70 lbs so far after  surgery.   Hip replacement in 6/21.  Dr. Rhona Raider.  [employer] has noted fluctuations in his HR.    Denies : Chest pain. Dizziness. Leg edema. Nitroglycerin use. Orthopnea. Palpitations. Paroxysmal nocturnal dyspnea. Shortness of breath. Syncope.      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    CHF (congestive heart failure) (HCC)    Colon polyps    Complication of anesthesia    Depression    Dyslipidemia    Dysrhythmia 2017   A fib   Family history of adverse reaction to anesthesia    un[place]   GERD (gastroesophageal reflux disease)    Glaucoma    early stage   History of 2019 novel coronavirus disease (COVID-19) 06/2019   History of kidney stones    Hypertension    OSA on CPAP    C-Pap   Persistent atrial fibrillation ([place])    Echo 3/18: Mild LVH, EF 55-60, normal wall motion, grade 1 diastolic dysfunction, mild LAE   Pneumonia    childhood   PONV (postoperative nausea and vomiting)    after 1st knee surgery only   Rhinitis, allergic     Past Surgical History:  Procedure Laterality Date   arthroscopic  knee surgery Bilateral 2003   CARDIOVERSION N/A 08/10/2016   Procedure: CARDIOVERSION;  Surgeon: Pixie Casino, MD;  Location: [facility];  Service: Cardiovascular;  Laterality: N/A;   COLONOSCOPY     LAPAROSCOPIC GASTRIC SLEEVE RESECTION N/A 07/15/2019   Procedure: LAPAROSCOPIC GASTRIC SLEEVE RESECTION, Upper Endo, ERAS Pathway;  Surgeon: [name], MD;  [facility];  Service: General;  Laterality: N/A;   REPLACEMENT TOTAL KNEE Bilateral 2008   TONSILLECTOMY     TOTAL HIP ARTHROPLASTY Right 10/21/2019   Procedure: RIGHT TOTAL HIP ARTHROPLASTY ANTERIOR APPROACH;  Surgeon: Melrose Nakayama, MD;  Location[facility];  Service: Orthopedics;  Laterality: Right;   VASECTOMY       Current Outpatient Medications  Medication Sig  Dispense Refill   acetaminophen (TYLENOL) 500 MG tablet Take 500 mg by mouth every 6 (six) hours as needed (for pain.).      amLODipine (NORVASC) 5 MG tablet TAKE 1 TABLET DAILY 90 tablet 1   azelastine (ASTELIN) 0.1 % nasal spray Place 2 sprays into both nostrils 2 (two) times daily. 30 mL 12   Calcium Carb-Cholecalciferol (CALCIUM-VITAMIN D) 600-400 MG-UNIT TABS Take 1 tablet by mouth in the morning, at noon, and at bedtime.     diphenhydramine-acetaminophen (TYLENOL PM) 25-500 MG TABS tablet Take 2 tablets by mouth at bedtime.      fexofenadine (ALLEGRA) 180 MG tablet Take 180 mg by mouth daily as needed for allergies or rhinitis.     Ginkgo Biloba 60 MG TABS Take 60 mg by mouth daily.     latanoprost (XALATAN) 0.005 % ophthalmic solution Place 1 drop into both eyes at bedtime.      methocarbamol (ROBAXIN) 750 MG tablet Take 1 tablet (750 mg total) by mouth every 6 (six) hours as needed for muscle spasms. 40 tablet 1   metoprolol succinate (TOPROL-XL) 25 MG 24 hr tablet Take 37.5 mg (1 1/2 tablets) daily with or immediately following a meal. 135 tablet 0   mometasone (NASONEX) 50 MCG/ACT nasal spray Place 2 sprays into the nose at bedtime.      Multiple Vitamin (MULTIVITAMIN WITH MINERALS) TABS tablet Take 2 tablets by mouth daily. Bariatric Multivitamin     Multiple Vitamins-Minerals (AIRBORNE PO) Take 1 tablet by mouth daily as needed (immune health).      PENNSAID 2 % SOLN Apply 1 Pump topically 4 (four) times daily as needed (aching joints).   1   rosuvastatin (CRESTOR) 20 MG tablet TAKE 1 TABLET DAILY 90 tablet 3   sertraline (ZOLOFT) 50 MG tablet TAKE 1 TABLET DAILY 90 tablet 0   tadalafil (CIALIS) 10 MG tablet Take 1 tablet (10 mg total) by mouth daily as needed for erectile dysfunction. 30 tablet 0   XARELTO 20 MG TABS tablet TAKE 1 TABLET DAILY WITH   SUPPER 90 tablet 1   No current facility-administered medications for this visit.    Allergies:   Patient has no known allergies.     Social History:  The patient  reports that he quit smoking about 19 years ago. His smoking use included cigarettes. He has never used smokeless tobacco. He reports that he does not currently use alcohol. He reports that he does not currently use drugs.   Family History:  The patient's family history includes Alzheimer's disease in his mother; Arthritis in his father and mother; Heart attack in his mother; Heart attack (age of onset: 90) in his father; Hyperlipidemia in his brother, father, and mother; Hypertension in his brother,  father, and mother; Other in his brother.    ROS:  Please see the history of present illness.   Otherwise, review of systems are positive for intentional weight loss.   All other systems are reviewed and negative.    PHYSICAL EXAM: VS:  BP 124/74   Pulse 81   Ht 6\' 2"  (1.88 m)   Wt 295 lb 9.6 oz (134.1 kg)   SpO2 97%   BMI 37.95 kg/m  , BMI Body mass index is 37.95 kg/m. GEN: Well nourished, well developed, in no acute distress HEENT: normal Neck: no JVD, carotid bruits, or masses Cardiac: irregularly irregular; no murmurs, rubs, or gallops,no edema  Respiratory:  clear to auscultation bilaterally, normal work of breathing GI: soft, nontender, nondistended, + BS MS: no deformity or atrophy; varicose veins in both legs Skin: warm and dry, no rash Neuro:  Strength and sensation are intact Psych: euthymic mood, full affect   EKG:   The ekg ordered today demonstrates atrial fibrillation, rate control, PVC   Recent Labs: 06/11/2020: ALT 24; BUN 12; Creatinine, Ser 0.87; Hemoglobin 15.5; Platelets 167.0; Potassium 4.7; Sodium 141; TSH 0.84   Lipid Panel    Component Value Date/Time   CHOL 152 06/11/2020 0938   CHOL 133 02/18/2019 0836   TRIG 203.0 (H) 06/11/2020 0938   HDL 51.60 06/11/2020 0938   HDL 40 02/18/2019 0836   CHOLHDL 3 06/11/2020 0938   VLDL 40.6 (H) 06/11/2020 0938   LDLCALC 71 06/11/2019 0944   LDLCALC 63 02/18/2019 0836    LDLDIRECT 76.0 06/11/2020 0938     Other studies Reviewed: Additional studies/ records that were reviewed today with results demonstrating: labs reviewed.  Available monitor results reviewed as well.   ASSESSMENT AND PLAN:  AFib: Monitor result pending.  Had some pauses while sleeping.  Rate controlled.  Thus far, no significant pauses while awake.  Heart rate well controlled today at 81 by ECG. Hypertensive heart disease: The current medical regimen is effective;  continue present plan and medications. Anticoagulated: Xarelto for stroke prevention.  No bleeding issues.  Beats per minute Morbid obesity: Continue to exercise.  Continue to eat healthy.  He has lost some weight since earlier in the year. Hyperlipidemia: TC 152 in 06/2020.  Well-controlled. OSA: CPAP tolerated well.    Current medicines are reviewed at length with the patient today.  The patient concerns regarding his medicines were addressed.  The following changes have been made:  No change  Labs/ tests ordered today include:  No orders of the defined types were placed in this encounter.   Recommend 150 minutes/week of aerobic exercise Low fat, low carb, high fiber diet recommended  Disposition:   FU in 6 months- will call with monitor result   Signed, Larae Grooms, MD  01/24/2021 1:11 PM    [facility] [address], [place], [place]  [address] Phone: [phone]; Fax: [phone]

## 2021-01-24 ENCOUNTER — Ambulatory Visit: Payer: Medicare Other | Admitting: Interventional Cardiology

## 2021-01-24 ENCOUNTER — Other Ambulatory Visit: Payer: Self-pay

## 2021-01-24 ENCOUNTER — Encounter: Payer: Self-pay | Admitting: Interventional Cardiology

## 2021-01-24 VITALS — BP 124/74 | HR 81 | Ht 74.0 in | Wt 295.6 lb

## 2021-01-24 DIAGNOSIS — I4819 Other persistent atrial fibrillation: Secondary | ICD-10-CM

## 2021-01-24 DIAGNOSIS — E782 Mixed hyperlipidemia: Secondary | ICD-10-CM

## 2021-01-24 DIAGNOSIS — I119 Hypertensive heart disease without heart failure: Secondary | ICD-10-CM

## 2021-01-24 DIAGNOSIS — Z9989 Dependence on other enabling machines and devices: Secondary | ICD-10-CM | POA: Diagnosis not present

## 2021-01-24 DIAGNOSIS — G4733 Obstructive sleep apnea (adult) (pediatric): Secondary | ICD-10-CM | POA: Diagnosis not present

## 2021-01-24 NOTE — Patient Instructions (Signed)
Medication Instructions:  Your physician recommends that you continue on your current medications as directed. Please refer to the Current Medication list given to you today.  *If you need a refill on your cardiac medications before your next appointment, please call your pharmacy*   Lab Work: none If you have labs (blood work) drawn today and your tests are completely normal, you will receive your results only by: [place] (if you have MyChart) OR A paper copy in the mail If you have any lab test that is abnormal or we need to change your treatment, we will call you to review the results.   Testing/Procedures: none   Follow-Up: At [facility], you and your health needs are our priority.  As part of our continuing mission to provide you with exceptional heart care, we have created designated Provider Care Teams.  These Care Teams include your primary Cardiologist (physician) and Advanced Practice Providers (APPs -  Physician Assistants and Nurse Practitioners) who all work together to provide you with the care you need, when you need it.  We recommend signing up for the patient portal called "MyChart".  Sign up information is provided on this After Visit Summary.  MyChart is used to connect with patients for Virtual Visits (Telemedicine).  Patients are able to view lab/test results, encounter notes, upcoming appointments, etc.  Non-urgent messages can be sent to your provider as well.   To learn more about what you can do with MyChart, go to [email or web address].    Your next appointment:   March 27,2023 at 8:00  The format for your next appointment:   In Person  Provider:   Casandra Doffing, MD   Other Instructions

## 2021-01-28 ENCOUNTER — Other Ambulatory Visit: Payer: Self-pay | Admitting: Family Medicine

## 2021-01-30 ENCOUNTER — Other Ambulatory Visit: Payer: Self-pay | Admitting: Family Medicine

## 2021-01-31 ENCOUNTER — Telehealth: Payer: Self-pay | Admitting: Interventional Cardiology

## 2021-01-31 NOTE — Telephone Encounter (Signed)
Pt received his results Via My Chart, pt would like someone to call him and explain  the results  to him. Please advise pt further on his cell phone

## 2021-01-31 NOTE — Telephone Encounter (Signed)
I spoke with patient and reviewed monitor results with him.  

## 2021-02-14 ENCOUNTER — Telehealth: Payer: Self-pay | Admitting: Interventional Cardiology

## 2021-02-14 DIAGNOSIS — I4819 Other persistent atrial fibrillation: Secondary | ICD-10-CM

## 2021-02-14 MED ORDER — RIVAROXABAN 20 MG PO TABS
20.0000 mg | ORAL_TABLET | Freq: Every day | ORAL | 1 refills | Status: DC
Start: 2021-02-14 — End: 2021-04-29

## 2021-02-14 NOTE — Telephone Encounter (Signed)
Xarelto 20 mg refill request received. Pt is 70 years old, weight- 134.1 kg, Crea- 0.87 on 06/11/20, last seen by Dr. Irish Lack on 01/24/21, Diagnosis-PAF, CrCl- 147.72 Dose is appropriate based on dosing criteria. Will send in refill to requested pharmacy.

## 2021-02-14 NOTE — Telephone Encounter (Signed)
*  STAT* If patient is at the pharmacy, call can be transferred to refill team.   1. Which medications need to be refilled? (please list name of each medication and dose if known) XARELTO 20 MG TABS tablet  2. Which pharmacy/location (including street and city if local pharmacy) is medication to be sent to? [place], [place]  3. Do they need a 30 day or 90 day supply? 7 day   Patient is out of medication and his mail order will take a week to arrive.

## 2021-02-17 ENCOUNTER — Other Ambulatory Visit: Payer: Self-pay | Admitting: Urology

## 2021-02-18 NOTE — Progress Notes (Signed)
DUE TO COVID-19 ONLY ONE VISITOR IS ALLOWED TO COME WITH YOU AND STAY IN THE WAITING ROOM ONLY DURING PRE OP AND PROCEDURE DAY OF SURGERY. THE 1 VISITOR  MAY VISIT WITH YOU AFTER SURGERY IN YOUR PRIVATE ROOM DURING VISITING HOURS ONLY!  YOU NEED TO HAVE A COVID 19 TEST ON_______ @_______ , THIS TEST MUST BE DONE BEFORE SURGERY,  COVID TESTING SITE IS AT [facility]. PLEASE REMAIN IN YOUR CAR THIS IS A DRIVER UP TEST. AFTER YOUR COVID TEST PLEASE WEAR A MASK OUT IN PUBLIC AND SOCIAL DISTANCE AND [place] YOUR HANDS FREQUENTLY. PLEASE ASK ALL YOUR CLOSE CONTACTS TO WEAR A MASK OUT IN PUBLIC AND SOCIAL DISTANCE AND [place] HANDS FREQUENTLY ALSO.               Isaiah Jones  02/18/2021   Your procedure is scheduled on:  03/04/2021   Report to [facility] Main  Entrance   Report to admitting at   0715am     Call this number if you have problems the morning of surgery [phone]    Remember: Do not eat food , candy gum or mints :After Midnight. You may have clear liquids from midnight until __  0630am    CLEAR LIQUID DIET   Foods Allowed                                                                       Coffee and tea, regular and decaf                              Plain Jell-O any favor except red or purple                                            Fruit ices (not with fruit pulp)                                      Iced Popsicles                                     Carbonated beverages, regular and diet                                    Cranberry, grape and apple juices Sports drinks like Gatorade Lightly seasoned clear broth or consume(fat free) Sugar   _____________________________________________________________________    BRUSH YOUR TEETH MORNING OF SURGERY AND RINSE YOUR MOUTH OUT, NO CHEWING GUM CANDY OR MINTS.     Take these medicines the morning of surgery with A SIP OF WATER: amlodipooine toprol, zoloft   DO NOT TAKE ANY DIABETIC  MEDICATIONS DAY OF YOUR SURGERY  You may not have any metal on your body including hair pins and              piercings  Do not wear jewelry, make-up, lotions, powders or perfumes, deodorant             Do not wear nail polish on your fingernails.  Do not shave  48 hours prior to surgery.              Men may shave face and neck.   Do not bring valuables to the hospital. [place].  Contacts, dentures or bridgework may not be worn into surgery.  Leave suitcase in the car. After surgery it may be brought to your room.     Patients discharged the day of surgery will not be allowed to drive home. IF YOU ARE HAVING SURGERY AND GOING HOME THE SAME DAY, YOU MUST HAVE AN ADULT TO DRIVE YOU HOME AND BE WITH YOU FOR 24 HOURS. YOU MAY GO HOME BY TAXI OR UBER OR ORTHERWISE, BUT AN ADULT MUST ACCOMPANY YOU HOME AND STAY WITH YOU FOR 24 HOURS.  Name and phone number of your driver:  Special Instructions: N/A              Please read over the following fact sheets you were given: _____________________________________________________________________  [facility] - Preparing for Surgery Before surgery, you can play an important role.  Because skin is not sterile, your skin needs to be as free of germs as possible.  You can reduce the number of germs on your skin by washing with CHG (chlorahexidine gluconate) soap before surgery.  CHG is an antiseptic cleaner which kills germs and bonds with the skin to continue killing germs even after washing. Please DO NOT use if you have an allergy to CHG or antibacterial soaps.  If your skin becomes reddened/irritated stop using the CHG and inform your nurse when you arrive at Short Stay. Do not shave (including legs and underarms) for at least 48 hours prior to the first CHG shower.  You may shave your face/neck. Please follow these instructions carefully:  1.  Shower with CHG Soap the night  before surgery and the  morning of Surgery.  2.  If you choose to wash your hair, wash your hair first as usual with your  normal  shampoo.  3.  After you shampoo, rinse your hair and body thoroughly to remove the  shampoo.                           4.  Use CHG as you would any other liquid soap.  You can apply chg directly  to the skin and wash                       Gently with a scrungie or clean washcloth.  5.  Apply the CHG Soap to your body ONLY FROM THE NECK DOWN.   Do not use on face/ open                           Wound or open sores. Avoid contact with eyes, ears mouth and genitals (private parts).  Wash face,  Genitals (private parts) with your normal soap.             6.  Wash thoroughly, paying special attention to the area where your surgery  will be performed.  7.  Thoroughly rinse your body with warm water from the neck down.  8.  DO NOT shower/wash with your normal soap after using and rinsing off  the CHG Soap.                9.  Pat yourself dry with a clean towel.            10.  Wear clean pajamas.            11.  Place clean sheets on your bed the night of your first shower and do not  sleep with pets. Day of Surgery : Do not apply any lotions/deodorants the morning of surgery.  Please wear clean clothes to the hospital/surgery center.  FAILURE TO FOLLOW THESE INSTRUCTIONS MAY RESULT IN THE CANCELLATION OF YOUR SURGERY PATIENT SIGNATURE_________________________________  NURSE SIGNATURE__________________________________  ________________________________________________________________________

## 2021-02-18 NOTE — Progress Notes (Signed)
Sent message, via epic in basket, requesting orders in epic from surgeon.  

## 2021-02-18 NOTE — Progress Notes (Addendum)
Anesthesia Review:  PCP: DR Dimas Chyle  Cardiologist : 01/24/21- LOV Dr Irish Lack  Monitor- 01/04/21  Chest x-ray : 2V 10/17/2019  EKG :01/24/21  Echo : 2018  Stress test: Cardiac Cath :  Activity level: can do a flight of stairs without difficulty  Sleep Study/ CPAP : has cpap  Fasting Blood Sugar :      / Checks Blood Sugar -- times a day:   Blood Thinner/ Instructions /Last Dose: ASA / Instructions/ Last Dose :   Xarelto  stop 3 days prior per pt  PT with hx of afib.  PT reports normal heart rate runs 40s 50s.  On dinamapp at preop heart rate was 38-61.  Noted recent monitor results in epic average heart rate 32- 70s.  PT denies any dizziness or lightheadedness at preop.   No covid test ambulatory surgery.

## 2021-02-22 ENCOUNTER — Encounter (HOSPITAL_COMMUNITY)
Admission: RE | Admit: 2021-02-22 | Discharge: 2021-02-22 | Disposition: A | Discharge status: Home / Self Care | Payer: Medicare Other | Source: Ambulatory Visit | Attending: Urology | Admitting: Urology

## 2021-02-22 ENCOUNTER — Encounter (HOSPITAL_COMMUNITY): Payer: Self-pay

## 2021-02-22 ENCOUNTER — Other Ambulatory Visit: Payer: Self-pay

## 2021-02-22 VITALS — BP 151/75 | HR 61 | Temp 97.6°F | Resp 16 | Ht 74.0 in | Wt 296.0 lb

## 2021-02-22 DIAGNOSIS — Z8616 Personal history of COVID-19: Secondary | ICD-10-CM | POA: Insufficient documentation

## 2021-02-22 DIAGNOSIS — I10 Essential (primary) hypertension: Secondary | ICD-10-CM

## 2021-02-22 DIAGNOSIS — I110 Hypertensive heart disease with heart failure: Secondary | ICD-10-CM | POA: Diagnosis not present

## 2021-02-22 DIAGNOSIS — I4891 Unspecified atrial fibrillation: Secondary | ICD-10-CM | POA: Insufficient documentation

## 2021-02-22 DIAGNOSIS — N201 Calculus of ureter: Secondary | ICD-10-CM | POA: Insufficient documentation

## 2021-02-22 DIAGNOSIS — Z79899 Other long term (current) drug therapy: Secondary | ICD-10-CM | POA: Diagnosis not present

## 2021-02-22 DIAGNOSIS — G4733 Obstructive sleep apnea (adult) (pediatric): Secondary | ICD-10-CM | POA: Insufficient documentation

## 2021-02-22 DIAGNOSIS — Z7901 Long term (current) use of anticoagulants: Secondary | ICD-10-CM | POA: Insufficient documentation

## 2021-02-22 DIAGNOSIS — Z01812 Encounter for preprocedural laboratory examination: Secondary | ICD-10-CM | POA: Diagnosis not present

## 2021-02-22 DIAGNOSIS — Z87891 Personal history of nicotine dependence: Secondary | ICD-10-CM | POA: Diagnosis not present

## 2021-02-22 LAB — BASIC METABOLIC PANEL
Anion gap: 7 (ref 5–15)
BUN: 17 mg/dL (ref 8–23)
CO2: 28 mmol/L (ref 22–32)
Calcium: 9.8 mg/dL (ref 8.9–10.3)
Chloride: 105 mmol/L (ref 98–111)
Creatinine, Ser: 0.85 mg/dL (ref 0.61–1.24)
GFR, Estimated: >60 mL/min (ref >60)
Glucose, Bld: 90 mg/dL (ref 70–99)
Potassium: 4.6 mmol/L (ref 3.5–5.1)
Sodium: 140 mmol/L (ref 135–145)

## 2021-02-22 LAB — CBC
HCT: 49.6 % (ref 39.0–52.0)
Hemoglobin: 16.5 g/dL (ref 13.0–17.0)
MCH: 33.3 pg (ref 26.0–34.0)
MCHC: 33.3 g/dL (ref 30.0–36.0)
MCV: 100 fL (ref 80.0–100.0)
Platelets: 169 10*3/uL (ref 150–400)
RBC: 4.96 MIL/uL (ref 4.22–5.81)
RDW: 13.3 % (ref 11.5–15.5)
WBC: 5.7 10*3/uL (ref 4.0–10.5)
nRBC: 0 % (ref 0.0–0.2)

## 2021-02-23 NOTE — Progress Notes (Signed)
Anesthesia Chart Review   Case: [id number] Date/Time: 03/04/21 0915   Procedures:      CYSTOSCOPY WITH RETROGRADE PYELOGRAM, URETEROSCOPY AND STENT PLACEMENT (Left) - 1 HR     HOLMIUM LASER APPLICATION (Left)   Anesthesia type: Choice   Pre-op diagnosis: LEFT URETERAL STONE   [facility] 03 / [facility]   Surgeons: Alexis Frock, MD       DISCUSSION:70 y.o. former smoker with h/o PONV, HTN, CHF, atrial fibrillation (on Xarelto), OSA, left ureteral stone scheduled for above procedure 03/04/2021 with Dr. Alexis Frock.   Pt last seen by cardiology 01/24/2021. Per OV note afib rate controlled, no significant pauses on monitor.  No changes made at this visit.   Advised to hold Xarelto 3 days prior to procedure.   Anticipate pt can proceed with planned procedure barring acute status change.   VS: BP (!) 151/75   Pulse 61   Temp 36.4 C (Oral)   Resp 16   Ht 6\' 2"  (1.88 m)   Wt 134.3 kg   SpO2 99%   BMI 38.00 kg/m   PROVIDERS: Vivi Barrack, MD is PCP   Larae Grooms, MD is Cardiologist  LABS: Labs reviewed: Acceptable for surgery. (all labs ordered are listed, but only abnormal results are displayed)  Labs Reviewed  BASIC METABOLIC PANEL  CBC     IMAGES:   EKG: 01/24/2021 Rate 81 bpm  Afib, PVC  CV: Echo 07/11/2016 - Left ventricle: The cavity size was normal. Wall thickness was    increased in a pattern of mild LVH. Systolic function was normal.    The estimated ejection fraction was in the range of 55% to 60%.    Wall motion was normal; there were no regional wall motion    abnormalities. Doppler parameters are consistent with abnormal    left ventricular relaxation (grade 1 diastolic dysfunction).  - Left atrium: The atrium was mildly dilated. Volume/bsa, S: 37.4    ml/m^2.  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     CHF (congestive heart failure) (HCC)    Colon polyps    Complication of anesthesia    Depression    Dyslipidemia    Dysrhythmia 2017   A fib   GERD (gastroesophageal reflux disease)    Glaucoma    early stage   History of 2019 novel coronavirus disease (COVID-19) 06/2019   History of kidney stones    Hypertension    OSA on CPAP    C-Pap   Persistent atrial fibrillation (HCC)    Echo 3/18: Mild LVH, EF 55-60, normal wall motion, grade 1 diastolic dysfunction, mild LAE   Pneumonia    childhood   PONV (postoperative nausea and vomiting)    after 1st knee surgery only   Rhinitis, allergic     Past Surgical History:  Procedure Laterality Date   arthroscopic knee surgery Bilateral 2003   CARDIOVERSION N/A 08/10/2016   Procedure: CARDIOVERSION;  Surgeon: Pixie Casino, MD;  Location: [facility];  Service: Cardiovascular;  Laterality: N/A;   COLONOSCOPY     LAPAROSCOPIC GASTRIC SLEEVE RESECTION N/A 07/15/2019   Procedure: LAPAROSCOPIC GASTRIC SLEEVE RESECTION, Upper Endo, ERAS Pathway;  Surgeon: [name], MD;  Location: [facility];  Service: General;  Laterality: N/A;   REPLACEMENT TOTAL KNEE Bilateral 2008   TONSILLECTOMY     TOTAL HIP  ARTHROPLASTY Right 10/21/2019   Procedure: RIGHT TOTAL HIP ARTHROPLASTY ANTERIOR APPROACH;  Surgeon: Melrose Nakayama, MD;  Location: [facility];  Service: Orthopedics;  Laterality: Right;   VASECTOMY      MEDICATIONS:  acetaminophen (TYLENOL) 500 MG tablet   amLODipine (NORVASC) 5 MG tablet   azelastine (ASTELIN) 0.1 % nasal spray   Calcium Carb-Cholecalciferol (CALCIUM-VITAMIN D) 600-400 MG-UNIT TABS   diphenhydramine-acetaminophen (TYLENOL PM) 25-500 MG TABS tablet   fexofenadine (ALLEGRA) 180 MG tablet   Ginkgo Biloba 60 MG TABS   latanoprost (XALATAN) 0.005 % ophthalmic solution   methocarbamol (ROBAXIN) 750 MG tablet   metoprolol succinate (TOPROL-XL) 25 MG 24 hr tablet   mometasone (NASONEX) 50 MCG/ACT nasal spray   Multiple Vitamin  (MULTIVITAMIN WITH MINERALS) TABS tablet   Multiple Vitamins-Minerals (AIRBORNE PO)   ondansetron (ZOFRAN) 4 MG tablet   OVER THE COUNTER MEDICATION   oxyCODONE-acetaminophen (PERCOCET/ROXICET) 5-325 MG tablet   PENNSAID 2 % SOLN   rivaroxaban (XARELTO) 20 MG TABS tablet   rosuvastatin (CRESTOR) 20 MG tablet   sertraline (ZOLOFT) 50 MG tablet   tadalafil (CIALIS) 10 MG tablet   tamsulosin (FLOMAX) 0.4 MG CAPS capsule   No current facility-administered medications for this encounter.     Konrad Felix Ward, PA-C [facility]Surgical Testing [phone]

## 2021-03-04 ENCOUNTER — Ambulatory Visit (HOSPITAL_COMMUNITY): Payer: Medicare Other | Admitting: Physician Assistant

## 2021-03-04 ENCOUNTER — Encounter (HOSPITAL_COMMUNITY)
Admission: RE | Disposition: A | Discharge status: Home / Self Care | Payer: Self-pay | Source: Ambulatory Visit | Attending: Urology

## 2021-03-04 ENCOUNTER — Ambulatory Visit (HOSPITAL_COMMUNITY): Payer: Medicare Other | Admitting: Certified Registered"

## 2021-03-04 ENCOUNTER — Ambulatory Visit (HOSPITAL_COMMUNITY): Payer: Medicare Other

## 2021-03-04 ENCOUNTER — Encounter (HOSPITAL_COMMUNITY): Payer: Self-pay | Admitting: Urology

## 2021-03-04 ENCOUNTER — Ambulatory Visit (HOSPITAL_COMMUNITY)
Admission: RE | Admit: 2021-03-04 | Discharge: 2021-03-04 | Disposition: A | Discharge status: Home / Self Care | Payer: Medicare Other | Source: Ambulatory Visit | Attending: Urology | Admitting: Urology

## 2021-03-04 DIAGNOSIS — N1330 Unspecified hydronephrosis: Secondary | ICD-10-CM | POA: Diagnosis not present

## 2021-03-04 DIAGNOSIS — Z96649 Presence of unspecified artificial hip joint: Secondary | ICD-10-CM | POA: Diagnosis not present

## 2021-03-04 DIAGNOSIS — I110 Hypertensive heart disease with heart failure: Secondary | ICD-10-CM | POA: Insufficient documentation

## 2021-03-04 DIAGNOSIS — Z8616 Personal history of COVID-19: Secondary | ICD-10-CM | POA: Insufficient documentation

## 2021-03-04 DIAGNOSIS — G4733 Obstructive sleep apnea (adult) (pediatric): Secondary | ICD-10-CM | POA: Diagnosis not present

## 2021-03-04 DIAGNOSIS — N201 Calculus of ureter: Secondary | ICD-10-CM | POA: Diagnosis present

## 2021-03-04 DIAGNOSIS — I4891 Unspecified atrial fibrillation: Secondary | ICD-10-CM | POA: Diagnosis not present

## 2021-03-04 DIAGNOSIS — I5032 Chronic diastolic (congestive) heart failure: Secondary | ICD-10-CM | POA: Insufficient documentation

## 2021-03-04 DIAGNOSIS — N281 Cyst of kidney, acquired: Secondary | ICD-10-CM | POA: Insufficient documentation

## 2021-03-04 DIAGNOSIS — Z87891 Personal history of nicotine dependence: Secondary | ICD-10-CM | POA: Diagnosis not present

## 2021-03-04 DIAGNOSIS — F32A Depression, unspecified: Secondary | ICD-10-CM | POA: Diagnosis not present

## 2021-03-04 DIAGNOSIS — F419 Anxiety disorder, unspecified: Secondary | ICD-10-CM | POA: Insufficient documentation

## 2021-03-04 DIAGNOSIS — Z9884 Bariatric surgery status: Secondary | ICD-10-CM | POA: Diagnosis not present

## 2021-03-04 DIAGNOSIS — Z7901 Long term (current) use of anticoagulants: Secondary | ICD-10-CM | POA: Insufficient documentation

## 2021-03-04 DIAGNOSIS — R310 Gross hematuria: Secondary | ICD-10-CM | POA: Insufficient documentation

## 2021-03-04 DIAGNOSIS — M1990 Unspecified osteoarthritis, unspecified site: Secondary | ICD-10-CM | POA: Insufficient documentation

## 2021-03-04 DIAGNOSIS — K219 Gastro-esophageal reflux disease without esophagitis: Secondary | ICD-10-CM | POA: Diagnosis not present

## 2021-03-04 DIAGNOSIS — Z6838 Body mass index (BMI) 38.0-38.9, adult: Secondary | ICD-10-CM | POA: Insufficient documentation

## 2021-03-04 DIAGNOSIS — E669 Obesity, unspecified: Secondary | ICD-10-CM | POA: Diagnosis not present

## 2021-03-04 HISTORY — PX: HOLMIUM LASER APPLICATION: SHX5852

## 2021-03-04 HISTORY — PX: CYSTOSCOPY WITH RETROGRADE PYELOGRAM, URETEROSCOPY AND STENT PLACEMENT: SHX5789

## 2021-03-04 SURGERY — CYSTOURETEROSCOPY, WITH RETROGRADE PYELOGRAM AND STENT INSERTION
Anesthesia: General | Laterality: Left

## 2021-03-04 MED ORDER — FENTANYL CITRATE (PF) 100 MCG/2ML IJ SOLN
INTRAMUSCULAR | Status: DC | PRN
  Administered 2021-03-04: 25 ug via INTRAVENOUS
  Administered 2021-03-04: 50 ug via INTRAVENOUS
  Administered 2021-03-04: 25 ug via INTRAVENOUS

## 2021-03-04 MED ORDER — DEXAMETHASONE SODIUM PHOSPHATE 10 MG/ML IJ SOLN
INTRAMUSCULAR | Status: AC
  Filled 2021-03-04: qty 1

## 2021-03-04 MED ORDER — LACTATED RINGERS IV SOLN: INTRAVENOUS | Status: DC

## 2021-03-04 MED ORDER — ONDANSETRON HCL 4 MG/2ML IJ SOLN
INTRAMUSCULAR | Status: DC | PRN
  Administered 2021-03-04: 4 mg via INTRAVENOUS

## 2021-03-04 MED ORDER — CEPHALEXIN 500 MG PO CAPS: 500.0000 mg | ORAL_CAPSULE | Freq: Two times a day (BID) | ORAL | 0 refills | Status: AC

## 2021-03-04 MED ORDER — IOHEXOL 300 MG/ML  SOLN
INTRAMUSCULAR | Status: DC | PRN
  Administered 2021-03-04: 17 mL

## 2021-03-04 MED ORDER — DEXAMETHASONE SODIUM PHOSPHATE 10 MG/ML IJ SOLN
INTRAMUSCULAR | Status: DC | PRN
  Administered 2021-03-04: 10 mg via INTRAVENOUS

## 2021-03-04 MED ORDER — KETOROLAC TROMETHAMINE 10 MG PO TABS: 10.0000 mg | ORAL_TABLET | Freq: Three times a day (TID) | ORAL | 0 refills | Status: DC | PRN

## 2021-03-04 MED ORDER — PROPOFOL 10 MG/ML IV BOLUS
INTRAVENOUS | Status: AC
  Filled 2021-03-04: qty 20

## 2021-03-04 MED ORDER — LIDOCAINE 2% (20 MG/ML) 5 ML SYRINGE
INTRAMUSCULAR | Status: DC | PRN
  Administered 2021-03-04: 80 mg via INTRAVENOUS

## 2021-03-04 MED ORDER — FENTANYL CITRATE (PF) 100 MCG/2ML IJ SOLN
INTRAMUSCULAR | Status: AC
  Filled 2021-03-04: qty 2

## 2021-03-04 MED ORDER — CEFAZOLIN IN SODIUM CHLORIDE 3-0.9 GM/100ML-% IV SOLN
3.0000 g | INTRAVENOUS | Status: AC
  Administered 2021-03-04: 3 g via INTRAVENOUS
  Filled 2021-03-04: qty 100

## 2021-03-04 MED ORDER — ORAL CARE MOUTH RINSE: 15.0000 mL | Freq: Once | OROMUCOSAL | Status: AC

## 2021-03-04 MED ORDER — SODIUM CHLORIDE 0.9 % IR SOLN
Status: DC | PRN
  Administered 2021-03-04: 3000 mL

## 2021-03-04 MED ORDER — SENNOSIDES-DOCUSATE SODIUM 8.6-50 MG PO TABS: 1.0000 | ORAL_TABLET | Freq: Two times a day (BID) | ORAL | 0 refills | Status: DC

## 2021-03-04 MED ORDER — OXYCODONE-ACETAMINOPHEN 5-325 MG PO TABS: 1.0000 | ORAL_TABLET | Freq: Four times a day (QID) | ORAL | 0 refills | Status: DC | PRN

## 2021-03-04 MED ORDER — CHLORHEXIDINE GLUCONATE 0.12 % MT SOLN
15.0000 mL | Freq: Once | OROMUCOSAL | Status: AC
  Administered 2021-03-04: 15 mL via OROMUCOSAL

## 2021-03-04 MED ORDER — ONDANSETRON HCL 4 MG/2ML IJ SOLN
INTRAMUSCULAR | Status: AC
  Filled 2021-03-04: qty 2

## 2021-03-04 MED ORDER — LIDOCAINE HCL (PF) 2 % IJ SOLN
INTRAMUSCULAR | Status: AC
  Filled 2021-03-04: qty 5

## 2021-03-04 MED ORDER — PROPOFOL 10 MG/ML IV BOLUS
INTRAVENOUS | Status: DC | PRN
  Administered 2021-03-04: 200 mg via INTRAVENOUS

## 2021-03-04 SURGICAL SUPPLY — 19 items
BAG URO CATCHER STRL LF (MISCELLANEOUS) ×2 IMPLANT
BASKET LASER NITINOL 1.9FR (BASKET) IMPLANT
CATH URETL OPEN END 6FR 70 (CATHETERS) ×2 IMPLANT
CLOTH BEACON ORANGE TIMEOUT ST (SAFETY) ×2 IMPLANT
EXTRACTOR STONE 1.7FRX115CM (UROLOGICAL SUPPLIES) ×2 IMPLANT
GLOVE SURG ENC TEXT LTX SZ7.5 (GLOVE) ×2 IMPLANT
GOWN STRL REUS W/TWL LRG LVL3 (GOWN DISPOSABLE) ×4 IMPLANT
GUIDEWIRE ANG ZIPWIRE 038X150 (WIRE) ×2 IMPLANT
GUIDEWIRE STR DUAL SENSOR (WIRE) ×2 IMPLANT
KIT TURNOVER KIT A (KITS) IMPLANT
MANIFOLD NEPTUNE II (INSTRUMENTS) ×2 IMPLANT
PACK CYSTO (CUSTOM PROCEDURE TRAY) ×2 IMPLANT
SHEATH URETERAL 12FRX35CM (MISCELLANEOUS) ×2 IMPLANT
STENT POLARIS 5FRX26 (STENTS) ×2 IMPLANT
TRACTIP FLEXIVA PULS ID 200XHI (Laser) ×1 IMPLANT
TRACTIP FLEXIVA PULSE ID 200 (Laser) ×1
TUBE FEEDING 8FR 16IN STR KANG (MISCELLANEOUS) ×2 IMPLANT
TUBING CONNECTING 10 (TUBING) ×2 IMPLANT
TUBING UROLOGY SET (TUBING) ×2 IMPLANT

## 2021-03-04 NOTE — OR Nursing (Signed)
Stone taken by Dr. Manny 

## 2021-03-04 NOTE — Brief Op Note (Signed)
03/04/2021  9:38 AM  PATIENT:  [name]  70 y.o. male  PRE-OPERATIVE DIAGNOSIS:  LEFT URETERAL STONE  POST-OPERATIVE DIAGNOSIS:  LEFT URETERAL STONE  PROCEDURE:  Procedure(s) with comments: CYSTOSCOPY WITH RETROGRADE PYELOGRAM, URETEROSCOPY AND STENT PLACEMENT (Left) - 1 HR HOLMIUM LASER APPLICATION (Left)  SURGEON:  Surgeon(s) and Role:    Alexis Frock, MD - Primary  PHYSICIAN ASSISTANT:   ASSISTANTS: none   ANESTHESIA:   general  EBL:  minimal   BLOOD ADMINISTERED:none  DRAINS: none   LOCAL MEDICATIONS USED:  NONE  SPECIMEN:  Source of Specimen:  left ureteral stone fragments  DISPOSITION OF SPECIMEN:   [facility] for compositional analysis  COUNTS:  YES  TOURNIQUET:  * No tourniquets in log *  DICTATION: .Other Dictation: Dictation Number [id number]  PLAN OF CARE: Admit for overnight observation  PATIENT DISPOSITION:  PACU - hemodynamically stable.   Delay start of Pharmacological VTE agent (>24hrs) due to surgical blood loss or risk of bleeding: yes

## 2021-03-04 NOTE — Discharge Instructions (Addendum)
1 - You may have urinary urgency (bladder spasms) and bloody urine on / off with stent in place. This is normal. ° °2 - Remove tethered stent on Monday morning at home by pulling on string, then blue-white plastic tubing, and discarding. Office is open Monday if any problems arise.  ° °3 - Call MD or go to ER for fever >102, severe pain / nausea / vomiting not relieved by medications, or acute change in medical status ° °

## 2021-03-04 NOTE — Anesthesia Postprocedure Evaluation (Signed)
Anesthesia Post Note  Patient: Isaiah Jones  Procedure(s) Performed: CYSTOSCOPY WITH BILATERAL RETROGRADES,PYELOGRAM, URETEROSCOPY AND STENT PLACEMENT (Left) HOLMIUM LASER APPLICATION (Left)     Patient location during evaluation: PACU Anesthesia Type: General Level of consciousness: awake and alert Pain management: pain level controlled Vital Signs Assessment: post-procedure vital signs reviewed and stable Respiratory status: spontaneous breathing, nonlabored ventilation, respiratory function stable and patient connected to nasal cannula oxygen Cardiovascular status: blood pressure returned to baseline and stable Postop Assessment: no apparent nausea or vomiting Anesthetic complications: no   No notable events documented.  Last Vitals:  Vitals:   03/04/21 1100 03/04/21 1115  BP: (!) 145/85   Pulse: (!) 58 (!) 56  Resp: 18 20  Temp:    SpO2: 97% 97%    Last Pain:  Vitals:   03/04/21 1115  TempSrc:   PainSc: 0-No pain                 Effie Berkshire

## 2021-03-04 NOTE — Anesthesia Procedure Notes (Signed)
Procedure Name: LMA Insertion Date/Time: 03/04/2021 8:58 AM Performed by: Jquan Egelston D, CRNA Pre-anesthesia Checklist: Patient identified, Emergency Drugs available, Suction available and Patient being monitored Patient Re-evaluated:Patient Re-evaluated prior to induction Oxygen Delivery Method: Circle system utilized Preoxygenation: Pre-oxygenation with 100% oxygen Induction Type: IV induction Ventilation: Mask ventilation without difficulty LMA: LMA inserted LMA Size: 5.0 Tube type: Oral Number of attempts: 1 Placement Confirmation: positive ETCO2 and breath sounds checked- equal and bilateral Tube secured with: Tape Dental Injury: Teeth and Oropharynx as per pre-operative assessment

## 2021-03-04 NOTE — H&P (Signed)
Isaiah Jones is an 70 y.o. male.    Chief Complaint: Pre-Op LEFT Ureteroscopic Stone Manipulation  HPI:   1 - Left Ureteral Stone - 4mm solitary left mid stone by CT 12/2020 on eval hematuria. No hydro, but persistant blood on/off.  2 - Bilateral Non-Complex Renal Cysts - L>R Multifocal large cysts w/o mass effect,   PMH sig for obesity / lap gastric sleeve, total hip, OSA/CPAP,AFib/Xarelto (prevention only, follows J. Varanassi). His PCP is Dimas Chyle MD.  Today " Isaiah Jones" is seen to proceed with LEFT ureteroscopy with goal of stone free and complete hematuria eval. No interval fevers. Cr 0.8, Most recent UA without infectious paremeters.   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    CHF (congestive heart failure) (HCC)    Colon polyps    Complication of anesthesia    Depression    Dyslipidemia    Dysrhythmia 2017   A fib   GERD (gastroesophageal reflux disease)    Glaucoma    early stage   History of 2019 novel coronavirus disease (COVID-19) 06/2019   History of kidney stones    Hypertension    OSA on CPAP    C-Pap   Persistent atrial fibrillation (HCC)    Echo 3/18: Mild LVH, EF 55-60, normal wall motion, grade 1 diastolic dysfunction, mild LAE   Pneumonia    childhood   PONV (postoperative nausea and vomiting)    after 1st knee surgery only   Rhinitis, allergic     Past Surgical History:  Procedure Laterality Date   arthroscopic knee surgery Bilateral 2003   CARDIOVERSION N/A 08/10/2016   Procedure: CARDIOVERSION;  Surgeon: Pixie Casino, MD;  Location: [place];  Service: Cardiovascular;  Laterality: N/A;   COLONOSCOPY     LAPAROSCOPIC GASTRIC SLEEVE RESECTION N/A 07/15/2019   Procedure: LAPAROSCOPIC GASTRIC SLEEVE RESECTION, Upper Endo, ERAS Pathway;  Surgeon: [name], MD;  Location: [facility];  Service: General;   Laterality: N/A;   REPLACEMENT TOTAL KNEE Bilateral 2008   TONSILLECTOMY     TOTAL HIP ARTHROPLASTY Right 10/21/2019   Procedure: RIGHT TOTAL HIP ARTHROPLASTY ANTERIOR APPROACH;  Surgeon: Melrose Nakayama, MD;  Location: [facility];  Service: Orthopedics;  Laterality: Right;   VASECTOMY      Family History  Problem Relation Age of Onset   Alzheimer's disease Mother    Heart attack Mother    Hyperlipidemia Mother    Hypertension Mother    Arthritis Mother    Heart attack Father 41   Arthritis Father    Hyperlipidemia Father    Hypertension Father    Hypertension Brother    Hyperlipidemia Brother    Other Brother        CAR ACCIDENT   Atrial fibrillation Neg Hx    Social History:  reports that he quit smoking about 19 years ago. His smoking use included cigarettes. He has never used smokeless tobacco. He reports current alcohol use. He reports that he does not use drugs.  Allergies: No Known Allergies  No medications prior to admission.    No results found for this or any previous visit (from the past 48 hour(s)). No results found.  Review of Systems  Constitutional:  Negative for chills and fever.  Genitourinary:  Positive for hematuria.  All other systems reviewed and are negative.  There were no vitals taken for  this visit. Physical Exam Vitals reviewed.  HENT:     Head: Normocephalic.     Mouth/Throat:     Mouth: Mucous membranes are moist.  Eyes:     Pupils: Pupils are equal, round, and reactive to light.  Abdominal:     Comments: Large truncal obesity limits sensitivity of exam. Prior scars w/o hernias.   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     Cervical back: Normal range of motion.  Neurological:     General: No focal deficit present.     Mental Status: He is alert.  Psychiatric:        Mood and Affect: Mood normal.     Assessment/Plan  Proceed as  planned with cysto, bilateral retrogrades, left ureteroscopic stone manipulation. Risks, benefits, alternatives, expected peri-op course discussed in detail.   Alexis Frock, MD 03/04/2021, 5:32 AM

## 2021-03-04 NOTE — Anesthesia Preprocedure Evaluation (Addendum)
Anesthesia Evaluation  Patient identified by MRN, date of birth, ID band Patient awake    Reviewed: Allergy & Precautions, NPO status , Patient's Chart, lab work & pertinent test results  History of Anesthesia Complications (+) PONV and history of anesthetic complications  Airway Mallampati: I  TM Distance: >3 FB Neck ROM: Full    Dental  (+) Missing,    Pulmonary sleep apnea , former smoker,    Pulmonary exam normal        Cardiovascular hypertension, +CHF  + dysrhythmias Atrial Fibrillation  Rhythm:Regular Rate:Normal  Echo: - Left ventricle: The cavity size was normal. Wall thickness was  increased in a pattern of mild LVH. Systolic function was normal.  The estimated ejection fraction was in the range of 55% to 60%.  Wall motion was normal; there were no regional wall motion  abnormalities. Doppler parameters are consistent with abnormal  left ventricular relaxation (grade 1 diastolic dysfunction).  - Left atrium: The atrium was mildly dilated. Volume/bsa, S: 37.4  ml/m^2.    Neuro/Psych PSYCHIATRIC DISORDERS Anxiety Depression    GI/Hepatic Neg liver ROS, GERD  ,  Endo/Other  negative endocrine ROS  Renal/GU negative Renal ROS     Musculoskeletal  (+) Arthritis ,   Abdominal Normal abdominal exam  (+)   Peds  Hematology negative hematology ROS (+)   Anesthesia Other Findings   Reproductive/Obstetrics                            Anesthesia Physical Anesthesia Plan  ASA: 3  Anesthesia Plan: General   Post-op Pain Management:    Induction: Intravenous  PONV Risk Score and Plan: 4 or greater and Ondansetron, Dexamethasone, Treatment may vary due to age or medical condition and Amisulpride  Airway Management Planned: LMA  Additional Equipment: None  Intra-op Plan:   Post-operative Plan: Extubation in OR  Informed Consent: I have reviewed the patients History  and Physical, chart, labs and discussed the procedure including the risks, benefits and alternatives for the proposed anesthesia with the patient or authorized representative who has indicated his/her understanding and acceptance.     Dental advisory given  Plan Discussed with: CRNA  Anesthesia Plan Comments:        Anesthesia Quick Evaluation

## 2021-03-04 NOTE — Transfer of Care (Signed)
Immediate Anesthesia Transfer of Care Note  Patient: Isaiah Jones  Procedure(s) Performed: CYSTOSCOPY WITH BILATERAL RETROGRADES,PYELOGRAM, URETEROSCOPY AND STENT PLACEMENT (Left) HOLMIUM LASER APPLICATION (Left)  Patient Location: PACU  Anesthesia Type:General  Level of Consciousness: awake, alert  and oriented  Airway & Oxygen Therapy: Patient Spontanous Breathing and Patient connected to face mask oxygen  Post-op Assessment: Report given to RN and Post -op Vital signs reviewed and stable  Post vital signs: Reviewed and stable  Last Vitals:  Vitals Value Taken Time  BP    Temp    Pulse 53 03/04/21 0949  Resp 8 03/04/21 0949  SpO2 100 % 03/04/21 0949  Vitals shown include unvalidated device data.  Last Pain:  Vitals:   03/04/21 0820  TempSrc:   PainSc: 4          Complications: No notable events documented.

## 2021-03-05 ENCOUNTER — Encounter (HOSPITAL_COMMUNITY): Payer: Self-pay | Admitting: Urology

## 2021-03-05 NOTE — Op Note (Signed)
NAME: Isaiah Jones, Isaiah Jones MEDICAL RECORD NO: [id number] ACCOUNT NO: [id number] DATE OF BIRTH: 1950-11-08 FACILITY: [facility] LOCATION: [facility] PHYSICIAN: Alexis Frock, MD  Operative Report   PREOPERATIVE DIAGNOSIS:  Left ureteral stone and gross hematuria.  PROCEDURE PERFORMED: 1.  Cystoscopy with bilateral retrograde pyelograms interpretation. 2.  Left ureteroscopy with laser lithotripsy. 3.  Insertion of left ureteral stent, 5 x 26 Polaris with tether.  ESTIMATED BLOOD LOSS:  Nil.  COMPLICATIONS:  None.  SPECIMEN:  Left ureteral stone fragments for composition analysis.  FINDINGS: 1.  Unremarkable bladder. 2.  Unremarkable right retrograde pyelogram. 3.  Fusiform left ureteral stone with mild hydronephrosis on left retrograde pyelogram. 4.  Successful removal of all accessible stone fragments larger than 1/3 mm left side on following laser lithotripsy and basket extraction. 5.  Successful placement of left ureteral stent, proximal end in renal pelvis, distal end in the urinary bladder.  INDICATIONS FOR PROCEDURE:  The patient is a pleasant 70 year old man with history of morbid obesity, status post gastric sleeve and gross hematuria.  He is on blood thinners for cardiac indication. On workup of gross hematuria, he was found to have a  left proximal ureteral stone that was minimally symptomatic other than hematuria.  He underwent an appropriate trial of medical therapy for this; however, he failed to pass the stone and still had on and off hematuria.  Options were discussed for further  management including recommended path of left ureteroscopy with goal of stone free and complete hematuria evaluation.  He wished to proceed.  Informed consent was obtained and placed in the medical record.  DESCRIPTION OF PROCEDURE IN DETAIL:  The patient being verified, procedure being left ureteroscopic stone manipulation was confirmed.  Procedure timeout was performed.  Intravenous antibiotics  administered.  General LMA anesthesia induced. The patient  was placed into the low lithotomy position.  Sterile field was created, prepped and draped the patient's penis, perineum and proximal thigh using iodine.  Cystourethroscopy was performed using a 21-French rigid cystoscope with offset lens.  Inspection of  the anterior and posterior urethra was unremarkable.  Inspection of the urinary bladder revealed no diverticula, calcifications, or papillary lesions.  The right ureteral orifice was cannulated with a 6-French end-hole catheter and right retrograde  pyelogram was obtained.  Right retrograde pyelogram demonstrated single right ureter, single system right kidney.  No filling defects or narrowing noted.  Similarly, left retrograde pyelogram was obtained.  Left retrograde pyelogram demonstrated a single left ureter with single system left kidney.  There was a questionable filling defect in the proximal ureter consistent with known stone.  There was some mild hydronephrosis above this.  0.038 ZIPwire was  advanced to the level of the lower pole, set aside as a safety wire.  The 8-French feeding tube placed in the urinary bladder for pressure release.  A semi-rigid ureteroscopy was performed distal half of the left ureter alongside a separate sensor  working wire.  No mucosal abnormalities are found.  Next, a semirigid scope was exchanged for a 11/13 good medium length ureteral access sheath using continuous fluoroscopic guidance to the level of mid ureter and flexible digital ureteroscopy from the  proximal left ureter.  The stone in question was indeed visualized in the proximal third of the ureter.  It was somewhat fusiform but very spiculated and too large for simple basketing.  As such, holmium laser energy was applied to the stone using  settings of 0.2 joules and 20 Hz.  This was fragmented into  approximately 3 smaller pieces that were then amenable to basketing with the Escape basket on the  long axis.  The remainder of the proximal ureter was then inspected as was the left kidney  including all calices x2.  There was complete resolution of all accessible stone fragments.  Access sheath was removed under continuous vision.  No significant mucosal abnormalities were found.  Given access sheath usage, it was felt that brief interval  stenting with tethered stent will be most prudent.  As such, a new 5 x 26 Polaris type stent was placed over the safety wire using fluoroscopic guidance. Good proximal and distal plane were noted.  The tether was left in place and fashioned to the dorsum  of the penis.  The procedure was terminated.  The patient tolerated the procedure well.  No immediate perioperative complications.  The patient was taken to postanesthesia care unit in stable condition.  Plan for discharge home.   NIK D: 03/04/2021 9:43:52 am T: 03/05/2021 1:16:00 am  JOB: [id number]/ [id number]

## 2021-03-17 ENCOUNTER — Other Ambulatory Visit: Payer: Self-pay | Admitting: Cardiology

## 2021-04-13 ENCOUNTER — Other Ambulatory Visit: Payer: Self-pay | Admitting: Family Medicine

## 2021-04-14 NOTE — Telephone Encounter (Signed)
Last OV- 12/20/20 Last refill: 01/31/2021 Disp: 90 tabs refills:0

## 2021-04-29 ENCOUNTER — Other Ambulatory Visit: Payer: Self-pay | Admitting: *Deleted

## 2021-04-29 DIAGNOSIS — I4819 Other persistent atrial fibrillation: Secondary | ICD-10-CM

## 2021-04-29 MED ORDER — RIVAROXABAN 20 MG PO TABS: 20.0000 mg | ORAL_TABLET | Freq: Every day | ORAL | 1 refills | Status: DC

## 2021-04-29 NOTE — Telephone Encounter (Signed)
.  Prescription refill request for Xarelto received.  Indication: afib  Last office visit: 01/24/2021, [facility] Weight: 134.3kg Age: 70 yo  Scr: 0.85, 02/22/2021 CrCl: 131ml/min

## 2021-06-14 ENCOUNTER — Encounter: Payer: Self-pay | Admitting: Family Medicine

## 2021-06-14 ENCOUNTER — Ambulatory Visit (INDEPENDENT_AMBULATORY_CARE_PROVIDER_SITE_OTHER): Payer: Medicare Other | Admitting: Family Medicine

## 2021-06-14 ENCOUNTER — Other Ambulatory Visit: Payer: Self-pay

## 2021-06-14 VITALS — BP 146/85 | HR 52 | Temp 97.7°F | Ht 74.0 in | Wt 306.8 lb

## 2021-06-14 DIAGNOSIS — M109 Gout, unspecified: Secondary | ICD-10-CM

## 2021-06-14 DIAGNOSIS — Z0001 Encounter for general adult medical examination with abnormal findings: Secondary | ICD-10-CM

## 2021-06-14 DIAGNOSIS — J302 Other seasonal allergic rhinitis: Secondary | ICD-10-CM

## 2021-06-14 DIAGNOSIS — E785 Hyperlipidemia, unspecified: Secondary | ICD-10-CM

## 2021-06-14 DIAGNOSIS — I10 Essential (primary) hypertension: Secondary | ICD-10-CM | POA: Diagnosis not present

## 2021-06-14 DIAGNOSIS — F325 Major depressive disorder, single episode, in full remission: Secondary | ICD-10-CM

## 2021-06-14 DIAGNOSIS — E559 Vitamin D deficiency, unspecified: Secondary | ICD-10-CM

## 2021-06-14 DIAGNOSIS — R739 Hyperglycemia, unspecified: Secondary | ICD-10-CM | POA: Diagnosis not present

## 2021-06-14 DIAGNOSIS — I4819 Other persistent atrial fibrillation: Secondary | ICD-10-CM

## 2021-06-14 LAB — CBC
HCT: 46.8 % (ref 39.0–52.0)
Hemoglobin: 15.9 g/dL (ref 13.0–17.0)
MCHC: 33.9 g/dL (ref 30.0–36.0)
MCV: 95.2 fl (ref 78.0–100.0)
Platelets: 209 10*3/uL (ref 150.0–400.0)
RBC: 4.92 Mil/uL (ref 4.22–5.81)
RDW: 13.6 % (ref 11.5–15.5)
WBC: 5.7 10*3/uL (ref 4.0–10.5)

## 2021-06-14 LAB — TSH: TSH: 1.17 u[IU]/mL (ref 0.35–5.50)

## 2021-06-14 LAB — VITAMIN D 25 HYDROXY (VIT D DEFICIENCY, FRACTURES): VITD: 27.27 ng/mL — ABNORMAL LOW (ref 30.00–100.00)

## 2021-06-14 LAB — HEMOGLOBIN A1C: Hgb A1c MFr Bld: 5.7 % (ref 4.6–6.5)

## 2021-06-14 MED ORDER — AZELASTINE HCL 0.1 % NA SOLN: 2.0000 | Freq: Two times a day (BID) | NASAL | 12 refills | Status: DC

## 2021-06-14 NOTE — Progress Notes (Signed)
Chief Complaint:  Isaiah Jones is a 70 y.o. male who presents today for his annual comprehensive physical exam.    Assessment/Plan:  Chronic Problems Addressed Today: Vitamin D deficiency Check vitamin D.   Morbid obesity (HCC) BMI 39.  We discussed lifestyle modifications.  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  Seasonal allergies Worsened recently.  We will send in more Astelin.  This is worked well for him in the past.  Gout No recent flares.  We will check uric acid.  Essential hypertension At goal per JNC 8 on amlodipine 5 mg daily and metoprolol succinate 50 mg daily.  Dyslipidemia Check labs.  He is on Crestor 20 mg daily.  Persistent atrial fibrillation ([place]) Follows with cardiology.  Anticoagulated on Xarelto and rate controlled on metoprolol.  Preventative Healthcare: Deferred shingle and pneumonia vaccine. Check labs.  Patient Counseling(The following topics were reviewed and/or handout was given):  -Nutrition: Stressed importance of moderation in sodium/caffeine intake, saturated fat and cholesterol, caloric balance, sufficient intake of fresh fruits, vegetables, and fiber.  -Stressed the importance of regular exercise.   -Substance Abuse: Discussed cessation/primary prevention of tobacco, alcohol, or other drug use; driving or other dangerous activities under the influence; availability of treatment for abuse.   -Injury prevention: Discussed safety belts, safety helmets, smoke detector, smoking near bedding or upholstery.   -Sexuality: Discussed sexually transmitted diseases, partner selection, use of condoms, avoidance of unintended pregnancy and contraceptive alternatives.   -Dental health: Discussed importance of regular tooth brushing, flossing, and dental visits.  -Health maintenance and immunizations reviewed. Please refer to Health maintenance section.  Return to care in 1 year for next preventative visit.      Subjective:  HPI:  He has no acute complaints today.   Patient here with rhinorrhea. This has been present for past 2-3 weeks.. This usually start in the morning. He has tried nasal and Allegra with some improvement. He does have hx of seasonal allergy. No sore throat, or cough. No reported fever or chills. Denies headaches.   He has been following up with cardiology for atrial fibrillation. His symptoms are well controlled on  Metoprolol. No weakness or chest pain.   Lifestyle Diet: None specific Exercise: None Specific.  Depression screen [facility] 2/9 06/14/2021  Decreased Interest 0  Down, Depressed, Hopeless 0  PHQ - 2 Score 0  Altered sleeping -  Tired, decreased energy -  Change in appetite -  Feeling bad or failure about yourself  -  Trouble concentrating -  Moving slowly or fidgety/restless -  Suicidal thoughts -  PHQ-9 Score -  Difficult doing work/chores -    Health Maintenance Due  Topic Date Due   TETANUS/TDAP  Never done     ROS: Per HPI, otherwise a complete review of systems was negative.   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    CHF (congestive heart failure) (HCC)    Colon polyps    Complication of anesthesia    Depression    Dyslipidemia    Dysrhythmia 2017   A fib   GERD (gastroesophageal reflux disease)    Glaucoma    early stage   History of 2019 novel coronavirus disease (COVID-19) 06/2019   History of kidney stones  Hypertension    OSA on CPAP    C-Pap   Persistent atrial fibrillation (HCC)    Echo 3/18: Mild LVH, EF 55-60, normal wall motion, grade 1 diastolic dysfunction, mild LAE   Pneumonia    childhood   PONV (postoperative nausea and vomiting)    after 1st knee surgery only   Rhinitis, allergic    Patient Active Problem  List   Diagnosis Date Noted   Cough 02/12/2020   Former smoker 12/10/2019   Primary localized osteoarthritis of right hip 10/21/2019   Primary osteoarthritis of right hip 10/21/2019   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   Vitamin D deficiency 06/16/2019   Erectile dysfunction 12/05/2018   Essential hypertension 12/05/2018   Gout 12/05/2018   Osteoarthritis 12/05/2018   Seasonal allergies 12/05/2018   Depression, major, 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 12/05/2018   Morbid obesity ([place]) 12/05/2018   Persistent atrial fibrillation (HCC)    OSA on CPAP    Dyslipidemia    Past Surgical History:  Procedure Laterality Date   arthroscopic knee surgery Bilateral 2003   CARDIOVERSION N/A 08/10/2016   Procedure: CARDIOVERSION;  Surgeon: Pixie Casino, MD;  Location: [facility] ENDOSCOPY;  Service: Cardiovascular;  Laterality: N/A;   COLONOSCOPY     CYSTOSCOPY WITH RETROGRADE PYELOGRAM, URETEROSCOPY AND STENT PLACEMENT Left 03/04/2021   Procedure: CYSTOSCOPY WITH BILATERAL RETROGRADES,PYELOGRAM, URETEROSCOPY AND STENT PLACEMENT;  Surgeon: Alexis Frock, MD;  Location: [facility] ORS;  Service: Urology;  Laterality: Left;  1 HR   HOLMIUM LASER APPLICATION Left 01/04/453   Procedure: HOLMIUM LASER APPLICATION;  Surgeon: Alexis Frock, MD;  Location: [facility] ORS;  Service: Urology;  Laterality: Left;   LAPAROSCOPIC GASTRIC SLEEVE RESECTION N/A 07/15/2019   Procedure: LAPAROSCOPIC GASTRIC SLEEVE RESECTION, Upper Endo, ERAS Pathway;  Surgeon: [name], MD;  Location: [facility] ORS;  Service: General;  Laterality: N/A;   REPLACEMENT TOTAL KNEE Bilateral 2008   TONSILLECTOMY     TOTAL HIP ARTHROPLASTY Right 10/21/2019   Procedure: RIGHT TOTAL HIP ARTHROPLASTY ANTERIOR APPROACH;  Surgeon: Melrose Nakayama, MD;  Location: [facility] ORS;  Service: Orthopedics;  Laterality: Right;   VASECTOMY      Family History  Problem Relation Age of Onset   Alzheimer's disease Mother    Heart attack Mother    Hyperlipidemia  Mother    Hypertension Mother    Arthritis Mother    Heart attack Father 70   Arthritis Father    Hyperlipidemia Father    Hypertension Father    Hypertension Brother    Hyperlipidemia Brother    Other Brother        CAR ACCIDENT   Atrial fibrillation Neg Hx     Medications- reviewed and updated Current Outpatient Medications  Medication Sig Dispense Refill   acetaminophen (TYLENOL) 500 MG tablet Take 500 mg by mouth every 6 (six) hours as needed (for pain.).      amLODipine (NORVASC) 5 MG tablet TAKE 1 TABLET DAILY 90 tablet 1   Calcium Carb-Cholecalciferol (CALCIUM-VITAMIN D) 600-400 MG-UNIT TABS Take 1 tablet by mouth in the morning, at noon, and at bedtime.     diphenhydramine-acetaminophen (TYLENOL PM) 25-500 MG TABS tablet Take 2 tablets by mouth at bedtime.      fexofenadine (ALLEGRA) 180 MG tablet Take 180 mg by mouth daily as needed for allergies or rhinitis.     Ginkgo Biloba 60 MG TABS Take 60 mg by mouth daily.     ketorolac (TORADOL) 10 MG tablet Take 1 tablet (10 mg total)  by mouth every 8 (eight) hours as needed for moderate pain (or stent discomfort post-operatively). 20 tablet 0   latanoprost (XALATAN) 0.005 % ophthalmic solution Place 1 drop into both eyes at bedtime.      methocarbamol (ROBAXIN) 750 MG tablet Take 1 tablet (750 mg total) by mouth every 6 (six) hours as needed for muscle spasms. 40 tablet 1   metoprolol succinate (TOPROL-XL) 25 MG 24 hr tablet TAKE 1 AND 1/2 TABLETS     DAILY WITH OR IMMEDIATELY  FOLLOWING A MEAL 135 tablet 3   mometasone (NASONEX) 50 MCG/ACT nasal spray Place 1 spray into the nose at bedtime.     Multiple Vitamin (MULTIVITAMIN WITH MINERALS) TABS tablet Take 2 tablets by mouth daily. Bariatric Multivitamin     Multiple Vitamins-Minerals (AIRBORNE PO) Take 1 tablet by mouth daily as needed (immune health).      ondansetron (ZOFRAN) 4 MG tablet Take 4 mg by mouth every 8 (eight) hours as needed for nausea or vomiting.     OVER THE  COUNTER MEDICATION Take 1 drop by mouth in the morning, at noon, and at bedtime. Hydrangea     PENNSAID 2 % SOLN Apply 1 Pump topically 4 (four) times daily as needed (aching joints).   1   rivaroxaban (XARELTO) 20 MG TABS tablet Take 1 tablet (20 mg total) by mouth daily with supper. 90 tablet 1   rosuvastatin (CRESTOR) 20 MG tablet TAKE 1 TABLET DAILY 90 tablet 3   senna-docusate (SENOKOT-S) 8.6-50 MG tablet Take 1 tablet by mouth 2 (two) times daily. While taking strong pain meds to prevent constipation 10 tablet 0   sertraline (ZOLOFT) 50 MG tablet TAKE 1 TABLET DAILY 90 tablet 0   tadalafil (CIALIS) 10 MG tablet Take 1 tablet (10 mg total) by mouth daily as needed for erectile dysfunction. 30 tablet 0   tamsulosin (FLOMAX) 0.4 MG CAPS capsule Take 0.4 mg by mouth at bedtime.     azelastine (ASTELIN) 0.1 % nasal spray Place 2 sprays into both nostrils 2 (two) times daily. 30 mL 12   No current facility-administered medications for this visit.    Allergies-reviewed and updated No Known Allergies  Social History   Socioeconomic History   Marital status: Married    Spouse name: Not on file   Number of children: Not on file   Years of education: Not on file   Highest education level: Not on file  Occupational History   Occupation: Retired   Tobacco Use   Smoking status: Former    Types: Cigarettes    Quit date: 07/04/2001    Years since quitting: 19.9   Smokeless tobacco: Never  Vaping Use   Vaping Use: Never used  Substance and Sexual Activity   Alcohol use: Yes    Comment: rare   Drug use: Never   Sexual activity: Yes    Comment: MARRIED  Other Topics Concern   Not on file  Social History Narrative   Lives in [place], [place]   Married   2 kids   Retired - Prior worked at [employer] Strain: Not on [employer] Insecurity: Not on Pensions consultant Needs: Not on file  Physical Activity: Not on file  Stress: Not on file   Social Connections: Not on file        Objective:  Physical Exam: BP (!) 146/85 (BP Location: Right Arm)    Pulse (!) 52  Temp 97.7 F (36.5 C) (Temporal)    Ht 6\' 2"  (1.88 m)    Wt (!) 306 lb 12.8 oz (139.2 kg)    SpO2 96%    BMI 39.39 kg/m   Body mass index is 39.39 kg/m. Wt Readings from Last 3 Encounters:  06/14/21 (!) 306 lb 12.8 oz (139.2 kg)  03/04/21 296 lb (134.3 kg)  02/22/21 296 lb (134.3 kg)   Gen: NAD, resting comfortably HEENT: TMs normal bilaterally. OP clear. No thyromegaly noted.  CV: RRR with no murmurs appreciated Pulm: NWOB, CTAB with no crackles, wheezes, or rhonchi GI: Normal bowel sounds present. Soft, Nontender, Nondistended. MSK: no edema, cyanosis, or clubbing noted Skin: warm, dry Neuro: CN2-12 grossly intact. Strength 5/5 in upper and lower extremities. Reflexes symmetric and intact bilaterally.  Psych: Normal affect and thought content      I,Savera Zaman,acting as a scribe for Dimas Chyle, MD.,have documented all relevant documentation on the behalf of Dimas Chyle, MD,as directed by  Dimas Chyle, MD while in the presence of Dimas Chyle, MD.   I, Dimas Chyle, MD, have reviewed all documentation for this visit. The documentation on 06/14/21 for the exam, diagnosis, procedures, and orders are all accurate and complete.  Algis Greenhouse. Jerline Pain, MD 06/14/2021 9:42 AM

## 2021-06-14 NOTE — Assessment & Plan Note (Signed)
Follows with cardiology.  Anticoagulated on Xarelto and rate controlled on metoprolol.

## 2021-06-14 NOTE — Patient Instructions (Signed)
It was very nice to see you today!  Please try the nasal spray.  We will check blood work today.  No medication changes today.  Please let me know need refills.  I will see back in a year.  Come back sooner if needed.  Take care, Dr Jerline Pain  PLEASE NOTE:  If you had any lab tests please let us know if you have not heard back within a few days. You may see your results on mychart before we have a chance to review them but we will give you a call once they are reviewed by [place]. If we ordered any referrals today, please let us know if you have not heard from their office within the next week.   Please try these tips to maintain a healthy lifestyle:  Eat at least 3 REAL meals and 1-2 snacks per day.  Aim for no more than 5 hours between eating.  If you eat breakfast, please do so within one hour of getting up.   Each meal should contain half fruits/vegetables, one quarter protein, and one quarter carbs (no bigger than a computer mouse)  Cut down on sweet beverages. This includes juice, soda, and sweet tea.   Drink at least 1 glass of water with each meal and aim for at least 8 glasses per day  Exercise at least 150 minutes every week.    Preventive Care 10 Years and Older, Male Preventive care refers to lifestyle choices and visits with your health care provider that can promote health and wellness. Preventive care visits are also called wellness exams. What can I expect for my preventive care visit? Counseling During your preventive care visit, your health care provider may ask about your: Medical history, including: Past medical problems. Family medical history. History of falls. Current health, including: Emotional well-being. Home life and relationship well-being. Sexual activity. Memory and ability to understand (cognition). Lifestyle, including: Alcohol, nicotine or tobacco, and drug use. Access to firearms. Diet, exercise, and sleep habits. Work and work  Statistician. Sunscreen use. Safety issues such as seatbelt and bike helmet use. Physical exam Your health care provider will check your: Height and weight. These may be used to calculate your BMI (body mass index). BMI is a measurement that tells if you are at a healthy weight. Waist circumference. This measures the distance around your waistline. This measurement also tells if you are at a healthy weight and may help predict your risk of certain diseases, such as type 2 diabetes and high blood pressure. Heart rate and blood pressure. Body temperature. Skin for abnormal spots. What immunizations do I need? Vaccines are usually given at various ages, according to a schedule. Your health care provider will recommend vaccines for you based on your age, medical history, and lifestyle or other factors, such as travel or where you work. What tests do I need? Screening Your health care provider may recommend screening tests for certain conditions. This may include: Lipid and cholesterol levels. Diabetes screening. This is done by checking your blood sugar (glucose) after you have not eaten for a while (fasting). Hepatitis C test. Hepatitis B test. HIV (human immunodeficiency virus) test. STI (sexually transmitted infection) testing, if you are at risk. Lung cancer screening. Colorectal cancer screening. Prostate cancer screening. Abdominal aortic aneurysm (AAA) screening. You may need this if you are a current or former smoker. Talk with your health care provider about your test results, treatment options, and if necessary, the need for more tests. Follow these  instructions at home: Eating and drinking  Eat a diet that includes fresh fruits and vegetables, whole grains, lean protein, and low-fat dairy products. Limit your intake of foods with high amounts of sugar, saturated fats, and salt. Take vitamin and mineral supplements as recommended by your health care provider. Do not drink alcohol  if your health care provider tells you not to drink. If you drink alcohol: Limit how much you have to 0-2 drinks a day. Know how much alcohol is in your drink. In the [place] one drink equals one 12 oz bottle of beer (355 mL), one 5 oz glass of wine (148 mL), or one 1 oz glass of hard liquor (44 mL). Lifestyle Brush your teeth every morning and night with fluoride toothpaste. Floss one time each day. Exercise for at least 30 minutes 5 or more days each week. Do not use any products that contain nicotine or tobacco. These products include cigarettes, chewing tobacco, and vaping devices, such as e-cigarettes. If you need help quitting, ask your health care provider. Do not use drugs. If you are sexually active, practice safe sex. Use a condom or other form of protection to prevent STIs. Take aspirin only as told by your health care provider. Make sure that you understand how much to take and what form to take. Work with your health care provider to find out whether it is safe and beneficial for you to take aspirin daily. Ask your health care provider if you need to take a cholesterol-lowering medicine (statin). Find healthy ways to manage stress, such as: Meditation, yoga, or listening to music. Journaling. Talking to a trusted person. Spending time with friends and family. Safety Always wear your seat belt while driving or riding in a vehicle. Do not drive: If you have been drinking alcohol. Do not ride with someone who has been drinking. When you are tired or distracted. While texting. If you have been using any mind-altering substances or drugs. Wear a helmet and other protective equipment during sports activities. If you have firearms in your house, make sure you follow all gun safety procedures. Minimize exposure to UV radiation to reduce your risk of skin cancer. What's next? Visit your health care provider once a year for an annual wellness visit. Ask your health care provider how  often you should have your eyes and teeth checked. Stay up to date on all vaccines. This information is not intended to replace advice given to you by your health care provider. Make sure you discuss any questions you have with your health care provider. Document Revised: 10/13/2020 Document Reviewed: 10/13/2020 Elsevier Patient Education  [place].

## 2021-06-14 NOTE — Assessment & Plan Note (Signed)
Stable on Zoloft 50 mg daily. 

## 2021-06-14 NOTE — Assessment & Plan Note (Signed)
Worsened recently.  We will send in more Astelin.  This is worked well for him in the past.

## 2021-06-14 NOTE — Assessment & Plan Note (Signed)
BMI 39.  We discussed lifestyle modifications.

## 2021-06-14 NOTE — Assessment & Plan Note (Signed)
No recent flares.  We will check uric acid.

## 2021-06-14 NOTE — Assessment & Plan Note (Signed)
Check vitamin D. 

## 2021-06-14 NOTE — Addendum Note (Signed)
Addended by: Vivi Barrack on: 06/14/2021 09:42 AM   Modules accepted: Level of Service

## 2021-06-14 NOTE — Assessment & Plan Note (Signed)
Check labs.  He is on Crestor 20 mg daily. 

## 2021-06-14 NOTE — Assessment & Plan Note (Signed)
At goal per JNC 8 on amlodipine 5 mg daily and metoprolol succinate 50 mg daily.

## 2021-06-15 LAB — URIC ACID: Uric Acid, Serum: 6.8 mg/dL (ref 4.0–7.8)

## 2021-06-15 LAB — LIPID PANEL
Cholesterol: 169 mg/dL (ref 0–200)
HDL: 53.8 mg/dL (ref >39.00)
LDL Cholesterol: 78 mg/dL (ref 0–99)
NonHDL: 115.22
Total CHOL/HDL Ratio: 3
Triglycerides: 188 mg/dL — ABNORMAL HIGH (ref 0.0–149.0)
VLDL: 37.6 mg/dL (ref 0.0–40.0)

## 2021-06-15 LAB — COMPREHENSIVE METABOLIC PANEL
ALT: 21 U/L (ref 0–53)
AST: 23 U/L (ref 0–37)
Albumin: 4.3 g/dL (ref 3.5–5.2)
Alkaline Phosphatase: 63 U/L (ref 39–117)
BUN: 15 mg/dL (ref 6–23)
CO2: 29 mEq/L (ref 19–32)
Calcium: 9.6 mg/dL (ref 8.4–10.5)
Chloride: 102 mEq/L (ref 96–112)
Creatinine, Ser: 0.91 mg/dL (ref 0.40–1.50)
GFR: 84.64 mL/min (ref >60.00)
Glucose, Bld: 89 mg/dL (ref 70–99)
Potassium: 4.4 mEq/L (ref 3.5–5.1)
Sodium: 142 mEq/L (ref 135–145)
Total Bilirubin: 0.6 mg/dL (ref 0.2–1.2)
Total Protein: 6.3 g/dL (ref 6.0–8.3)

## 2021-06-16 ENCOUNTER — Other Ambulatory Visit: Payer: Self-pay | Admitting: Family Medicine

## 2021-06-16 NOTE — Progress Notes (Signed)
Please inform patient of the following:  His vitamin D is a bit low. Recommend taking 1000-2000 IU daily and we can recheck in  6-12 months.   His blood sugar is borderline but stable. Everything else is NORMAL. Do not need to make any changes to his treatment plan at this time and we can recheck in 1 year.  Algis Greenhouse. Jerline Pain, MD 06/16/2021 9:25 AM

## 2021-06-20 ENCOUNTER — Telehealth: Payer: Self-pay | Admitting: Family Medicine

## 2021-06-20 NOTE — Telephone Encounter (Signed)
Copied from [place] [id number]. Topic: Medicare AWV >> Jun 20, 2021 10:50 AM Harris-Coley, Hannah Beat wrote: Reason for CRM: Left message for patient to schedule Annual Wellness Visit.  Please schedule with Nurse Health Advisor Charlott Rakes, RN at [facility].  Please call [phone] ask for [facility]

## 2021-07-21 ENCOUNTER — Telehealth: Payer: Self-pay | Admitting: Family Medicine

## 2021-07-21 NOTE — Telephone Encounter (Signed)
Pt is asking for a referral to see a throat specialists regarding issues he has previously discussed with you. Dr Benjamine Mola no longer does throats and prefers to see a specialist that can take care of everything in one place. Please advise if you would like me to place new referral ?

## 2021-07-21 NOTE — Telephone Encounter (Signed)
Patient stated he is under the care of Dr Benjamine Mola for Ear issues- is now requesting  a throat specialist- patient was advised Dr Jerline Pain may want to bring him in to be seen as this is a new issue. Patient DOES NOT want to come in. Patient wants a referral to be placed to an ear nose and throat md without having to see Dr Jerline Pain-  ?

## 2021-07-21 NOTE — Telephone Encounter (Signed)
Please advise 

## 2021-07-22 ENCOUNTER — Other Ambulatory Visit: Payer: Self-pay

## 2021-07-22 DIAGNOSIS — M1611 Unilateral primary osteoarthritis, right hip: Secondary | ICD-10-CM

## 2021-07-22 DIAGNOSIS — R053 Chronic cough: Secondary | ICD-10-CM

## 2021-07-22 NOTE — Telephone Encounter (Signed)
Referral placed to provider patient chose and already has an appt scheduled.  ?

## 2021-07-22 NOTE — Telephone Encounter (Signed)
Ok to place new referral. ? ?Algis Greenhouse. Jerline Pain, MD ?07/22/2021 10:14 AM  ? ?

## 2021-07-22 NOTE — Telephone Encounter (Signed)
Pt has found an ENT specialist. He has an appt with Dr Constance Holster in June. He stated if you have any questions, please feel free to call him. ?

## 2021-07-24 NOTE — Progress Notes (Signed)
?  ?Cardiology Office Note ? ? ?Date:  07/25/2021  ? ?ID:  Isaiah Jones, DOB 28-Jan-1951, MRN [id number] ? ?PCP:  Vivi Barrack, MD  ? ? ?Chief Complaint  ?Patient presents with  ? Follow-up  ? ?AFib ? ?Wt Readings from Last 3 Encounters:  ?07/25/21 (!) 305 lb (138.3 kg)  ?06/14/21 (!) 306 lb 12.8 oz (139.2 kg)  ?03/04/21 296 lb (134.3 kg)  ?  ? ?  ?History of Present Illness: ?Isaiah Jones is a 71 y.o. male    with history of hyperlipidemia, hypertension and obesity. He also is treated for sleep apnea with CPAP. He was seen in our office back in March 2018 due to new onset atrial fibrillation. His pulmonologist found the irregular heartbeat. Atrial fibrillation was diagnosed. He has had some fatigue and intermittent palpitations over the past few months prior to diagnosis. ?  ?At his initial visit in our office in March 2018, he was started on Toprol-XL 50 mg daily for rate control, Xarelto 20 mg daily for stroke prevention. Cardioversion was considered as well. He was advised to lose weight. ?  ?Echocardiogram from March 2018 revealed: ?- Left ventricle: The cavity size was normal. Wall thickness was ?  increased in a pattern of mild LVH. Systolic function was normal. ?  The estimated ejection fraction was in the range of 55% to 60%. ?  Wall motion was normal; there were no regional wall motion ?  abnormalities. Doppler parameters are consistent with abnormal ?  left ventricular relaxation (grade 1 diastolic dysfunction). ?- Left atrium: The atrium was mildly dilated. Volume/bsa, S: 37.4 ?  ml/m^2. ?  ?He had his cardioversion but it was not successful.  He has been maintained on rate control meds. He feels colder than he did before after starting anticoagulation. ?  ?He has had weight fluctuations.  He has some hip problems but was told he is too big for ortho surgery.  He was 375 lbs at that time.  ?  ?At the last visit, he was considering weight loss surgery.  It occurred in 3/21.  Then he had COVID.  Lost  70 lbs so far after surgery. ?  ?Hip replacement in 6/21.  Dr. Rhona Raider. ? ?Monitor showed in 12/2020: ?"Atrial fibrillation with average HR in the high 50s to low 70s range. ?? Pauses noted during sleeping hours. ?Frequent PVCs. " ? ?Denies : Chest pain. Dizziness. Nitroglycerin use. Orthopnea. Paroxysmal nocturnal dyspnea. Shortness of breath. Syncope.   ? ?Rare, palpitations lasting just a few minutes.   Occasional leg edema which resolves by elevating legs. ? ?Past Medical History:  ?Diagnosis Date  ? Anxiety   ? Arthritis, degenerative   ? hip,shoulders, hands, back  ? 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 CHF (congestive heart failure) ([place])   ? Colon polyps   ? Complication of anesthesia   ? Depression   ? Dyslipidemia   ? Dysrhythmia 2017  ? A fib  ? GERD (gastroesophageal reflux disease)   ? Glaucoma   ? early stage  ? History of 2019 novel coronavirus disease (COVID-19) 06/2019  ? History of kidney stones   ? Hypertension   ? OSA on CPAP   ? C-Pap  ? Persistent atrial fibrillation ([place])   ? Echo 3/18: Mild LVH, EF 55-60, normal wall motion, grade 1 diastolic dysfunction, mild LAE  ? Pneumonia   ? childhood  ? PONV (postoperative nausea and vomiting)   ? after  1st knee surgery only  ? Rhinitis, allergic   ? ? ?Past Surgical History:  ?Procedure Laterality Date  ? arthroscopic knee surgery Bilateral 2003  ? CARDIOVERSION N/A 08/10/2016  ? Procedure: CARDIOVERSION;  Surgeon: Pixie Casino, MD;  Location: [facility] ENDOSCOPY;  Service: Cardiovascular;  Laterality: N/A;  ? COLONOSCOPY    ? CYSTOSCOPY WITH RETROGRADE PYELOGRAM, URETEROSCOPY AND STENT PLACEMENT Left 03/04/2021  ? Procedure: CYSTOSCOPY WITH BILATERAL RETROGRADES,PYELOGRAM, URETEROSCOPY AND STENT PLACEMENT;  Surgeon: Alexis Frock, MD;  Location: [facility] ORS;  Service: Urology;  Laterality: Left;  1 HR  ? HOLMIUM LASER APPLICATION Left 34/05/9620  ? Procedure: HOLMIUM LASER APPLICATION;  Surgeon: Alexis Frock, MD;  Location:  [facility] ORS;  Service: Urology;  Laterality: Left;  ? LAPAROSCOPIC GASTRIC SLEEVE RESECTION N/A 07/15/2019  ? Procedure: LAPAROSCOPIC GASTRIC SLEEVE RESECTION, Upper Endo, ERAS Pathway;  Surgeon: [name], MD;  Location: [facility] ORS;  Service: General;  Laterality: N/A;  ? REPLACEMENT TOTAL KNEE Bilateral 2008  ? TONSILLECTOMY    ? TOTAL HIP ARTHROPLASTY Right 10/21/2019  ? Procedure: RIGHT TOTAL HIP ARTHROPLASTY ANTERIOR APPROACH;  Surgeon: Melrose Nakayama, MD;  Location: [facility] ORS;  Service: Orthopedics;  Laterality: Right;  ? VASECTOMY    ? ? ? ?Current Outpatient Medications  ?Medication Sig Dispense Refill  ? acetaminophen (TYLENOL) 500 MG tablet Take 500 mg by mouth every 6 (six) hours as needed (for pain.).     ? amLODipine (NORVASC) 5 MG tablet TAKE 1 TABLET DAILY 90 tablet 1  ? azelastine (ASTELIN) 0.1 % nasal spray Place 2 sprays into both nostrils 2 (two) times daily. 30 mL 12  ? Calcium Carb-Cholecalciferol (CALCIUM-VITAMIN D) 600-400 MG-UNIT TABS Take 1 tablet by mouth in the morning, at noon, and at bedtime.    ? diphenhydramine-acetaminophen (TYLENOL PM) 25-500 MG TABS tablet Take 2 tablets by mouth at bedtime.     ? fexofenadine (ALLEGRA) 180 MG tablet Take 180 mg by mouth daily as needed for allergies or rhinitis.    ? Ginkgo Biloba 60 MG TABS Take 60 mg by mouth daily.    ? latanoprost (XALATAN) 0.005 % ophthalmic solution Place 1 drop into both eyes at bedtime.     ? methocarbamol (ROBAXIN) 750 MG tablet Take 1 tablet (750 mg total) by mouth every 6 (six) hours as needed for muscle spasms. 40 tablet 1  ? metoprolol succinate (TOPROL-XL) 25 MG 24 hr tablet TAKE 1 AND 1/2 TABLETS     DAILY WITH OR IMMEDIATELY  FOLLOWING A MEAL 135 tablet 3  ? mometasone (NASONEX) 50 MCG/ACT nasal spray Place 1 spray into the nose at bedtime.    ? Multiple Vitamin (MULTIVITAMIN WITH MINERALS) TABS tablet Take 2 tablets by mouth daily. Bariatric Multivitamin    ? Multiple Vitamins-Minerals (AIRBORNE PO) Take 1 tablet by mouth  daily as needed (immune health).     ? PENNSAID 2 % SOLN Apply 1 Pump topically 4 (four) times daily as needed (aching joints).   1  ? rivaroxaban (XARELTO) 20 MG TABS tablet Take 1 tablet (20 mg total) by mouth daily with supper. 90 tablet 1  ? rosuvastatin (CRESTOR) 20 MG tablet TAKE 1 TABLET DAILY 90 tablet 3  ? senna-docusate (SENOKOT-S) 8.6-50 MG tablet Take 1 tablet by mouth 2 (two) times daily. While taking strong pain meds to prevent constipation 10 tablet 0  ? sertraline (ZOLOFT) 50 MG tablet TAKE 1 TABLET DAILY 90 tablet 0  ? tadalafil (CIALIS) 10 MG tablet Take 1 tablet (10 mg  total) by mouth daily as needed for erectile dysfunction. 30 tablet 0  ? ?No current facility-administered medications for this visit.  ? ? ?Allergies:   Patient has no known allergies.  ? ? ?Social History:  The patient  reports that he quit smoking about 20 years ago. His smoking use included cigarettes. He has never used smokeless tobacco. He reports current alcohol use. He reports that he does not use drugs.  ? ?Family History:  The patient's family history includes Alzheimer's disease in his mother; Arthritis in his father and mother; Heart attack in his mother; Heart attack (age of onset: 73) in his father; Hyperlipidemia in his brother, father, and mother; Hypertension in his brother, father, and mother; Other in his brother.  ? ? ?ROS:  Please see the history of present illness.   Otherwise, review of systems are positive for rare palpitations.   All other systems are reviewed and negative.  ? ? ?PHYSICAL EXAM: ?VS:  BP 132/70   Pulse 78   Ht '6\' 2"'$  (1.88 m)   Wt (!) 305 lb (138.3 kg)   SpO2 96%   BMI 39.16 kg/m?  , BMI Body mass index is 39.16 kg/m?. ?GEN: Well nourished, well developed, in no acute distress ?HEENT: normal ?Neck: no JVD, carotid bruits, or masses ?Cardiac: irregularly irregular; no murmurs, rubs, or gallops,no edema  ?Respiratory:  clear to auscultation bilaterally, normal work of breathing ?GI: soft,  nontender, nondistended, + BS, obese ?MS: no deformity or atrophy;  varicose veins noted bilaterally ?Skin: warm and dry, no rash ?Neuro:  Strength and sensation are intact ?Psych: euthymic mood, full af

## 2021-07-25 ENCOUNTER — Other Ambulatory Visit: Payer: Self-pay

## 2021-07-25 ENCOUNTER — Encounter: Payer: Self-pay | Admitting: Interventional Cardiology

## 2021-07-25 ENCOUNTER — Ambulatory Visit: Payer: Medicare Other | Admitting: Interventional Cardiology

## 2021-07-25 VITALS — BP 132/70 | HR 78 | Ht 74.0 in | Wt 305.0 lb

## 2021-07-25 DIAGNOSIS — Z9989 Dependence on other enabling machines and devices: Secondary | ICD-10-CM

## 2021-07-25 DIAGNOSIS — G4733 Obstructive sleep apnea (adult) (pediatric): Secondary | ICD-10-CM

## 2021-07-25 DIAGNOSIS — I119 Hypertensive heart disease without heart failure: Secondary | ICD-10-CM | POA: Diagnosis not present

## 2021-07-25 DIAGNOSIS — I4819 Other persistent atrial fibrillation: Secondary | ICD-10-CM | POA: Diagnosis not present

## 2021-07-25 DIAGNOSIS — E782 Mixed hyperlipidemia: Secondary | ICD-10-CM

## 2021-07-25 NOTE — Patient Instructions (Signed)
Medication Instructions:  ?Your physician recommends that you continue on your current medications as directed. Please refer to the Current Medication list given to you today. ? ?*If you need a refill on your cardiac medications before your next appointment, please call your pharmacy* ? ? ?Lab Work: ?none ?If you have labs (blood work) drawn today and your tests are completely normal, you will receive your results only by: ?MyChart Message (if you have MyChart) OR ?A paper copy in the mail ?If you have any lab test that is abnormal or we need to change your treatment, we will call you to review the results. ? ? ?Testing/Procedures: ?none ? ? ?Follow-Up: ?At [facility], you and your health needs are our priority.  As part of our continuing mission to provide you with exceptional heart care, we have created designated Provider Care Teams.  These Care Teams include your primary Cardiologist (physician) and Advanced Practice Providers (APPs -  Physician Assistants and Nurse Practitioners) who all work together to provide you with the care you need, when you need it. ? ?We recommend signing up for the patient portal called "MyChart".  Sign up information is provided on this After Visit Summary.  MyChart is used to connect with patients for Virtual Visits (Telemedicine).  Patients are able to view lab/test results, encounter notes, upcoming appointments, etc.  Non-urgent messages can be sent to your provider as well.   ?To learn more about what you can do with MyChart, go to [email or web address].   ? ?Your next appointment:   ? About 8 month(s)-December 2023 ? ?The format for your next appointment:   ?In Person ? ?Provider:   ?Larae Grooms, MD   ? ? ?Other Instructions ?  ? ?

## 2021-08-30 ENCOUNTER — Other Ambulatory Visit: Payer: Self-pay | Admitting: Family Medicine

## 2021-10-11 DIAGNOSIS — H9110 Presbycusis, unspecified ear: Secondary | ICD-10-CM | POA: Insufficient documentation

## 2021-10-11 DIAGNOSIS — K219 Gastro-esophageal reflux disease without esophagitis: Secondary | ICD-10-CM | POA: Insufficient documentation

## 2021-10-13 ENCOUNTER — Other Ambulatory Visit: Payer: Self-pay | Admitting: Otolaryngology

## 2021-10-13 DIAGNOSIS — K219 Gastro-esophageal reflux disease without esophagitis: Secondary | ICD-10-CM

## 2021-10-13 DIAGNOSIS — H9113 Presbycusis, bilateral: Secondary | ICD-10-CM

## 2021-10-13 DIAGNOSIS — R1314 Dysphagia, pharyngoesophageal phase: Secondary | ICD-10-CM

## 2021-10-13 DIAGNOSIS — Z87891 Personal history of nicotine dependence: Secondary | ICD-10-CM

## 2021-10-24 ENCOUNTER — Other Ambulatory Visit: Payer: Self-pay | Admitting: Otolaryngology

## 2021-10-24 ENCOUNTER — Ambulatory Visit
Admission: RE | Admit: 2021-10-24 | Discharge: 2021-10-24 | Disposition: A | Discharge status: Home / Self Care | Payer: Medicare Other | Source: Ambulatory Visit | Attending: Otolaryngology | Admitting: Otolaryngology

## 2021-10-24 DIAGNOSIS — H9113 Presbycusis, bilateral: Secondary | ICD-10-CM

## 2021-10-24 DIAGNOSIS — K219 Gastro-esophageal reflux disease without esophagitis: Secondary | ICD-10-CM

## 2021-10-24 DIAGNOSIS — Z87891 Personal history of nicotine dependence: Secondary | ICD-10-CM

## 2021-10-24 DIAGNOSIS — R1314 Dysphagia, pharyngoesophageal phase: Secondary | ICD-10-CM

## 2021-10-27 ENCOUNTER — Encounter: Payer: Self-pay | Admitting: Physician Assistant

## 2021-11-14 ENCOUNTER — Other Ambulatory Visit: Payer: Self-pay | Admitting: Interventional Cardiology

## 2021-11-14 DIAGNOSIS — I4819 Other persistent atrial fibrillation: Secondary | ICD-10-CM

## 2021-11-14 NOTE — Telephone Encounter (Signed)
Prescription refill request for Xarelto received.  Indication: Atrial Fib Last office visit: 07/25/21  Lendell Caprice MD Weight: 138.3kg Age: 71 Scr: 0.91 on 06/14/21 CrCl:  143.53  Based on above findings Xarelto '20mg'$  daily is the appropriate dose.  Refill approved.

## 2021-11-21 ENCOUNTER — Telehealth: Payer: Self-pay | Admitting: Family Medicine

## 2021-11-21 NOTE — Telephone Encounter (Signed)
Copied from [place]. Topic: Medicare AWV >> Nov 21, 2021  1:37 PM Devoria Glassing wrote: Reason for CRM: Left message for patient to schedule Annual Wellness Visit.  Please schedule with Nurse Health Advisor Charlott Rakes, RN at [facility]. This appt can be telephone or office visit. Please call [phone] ask for [facility]

## 2021-11-24 ENCOUNTER — Encounter: Payer: Self-pay | Admitting: Physician Assistant

## 2021-11-24 ENCOUNTER — Ambulatory Visit: Payer: Medicare Other | Admitting: Physician Assistant

## 2021-11-24 ENCOUNTER — Telehealth: Payer: Self-pay

## 2021-11-24 VITALS — BP 118/78 | HR 70 | Ht 74.0 in | Wt 309.0 lb

## 2021-11-24 DIAGNOSIS — Z903 Acquired absence of stomach [part of]: Secondary | ICD-10-CM | POA: Diagnosis not present

## 2021-11-24 DIAGNOSIS — R1310 Dysphagia, unspecified: Secondary | ICD-10-CM | POA: Diagnosis not present

## 2021-11-24 DIAGNOSIS — R933 Abnormal findings on diagnostic imaging of other parts of digestive tract: Secondary | ICD-10-CM

## 2021-11-24 MED ORDER — PLENVU 140 G PO SOLR: 1.0000 | ORAL | 0 refills | Status: DC

## 2021-11-24 NOTE — Progress Notes (Signed)
I agree with the assessment and plan as outlined by Ms. Fairdale. Please have the patient hold his Xarelto for at least 2 days before his EGD procedure in case dilation of an esophageal stricture is needed.

## 2021-11-24 NOTE — Patient Instructions (Signed)
If you are age 72 or older, your body mass index should be between 23-30. Your Body mass index is 39.67 kg/m. If this is out of the aforementioned range listed, please consider follow up with your Primary Care Provider. ________________________________________________________  The [facility] providers would like to encourage you to use [facility] to communicate with providers for non-urgent requests or questions.  Due to long hold times on the telephone, sending your provider a message by [facility] may be a faster and more efficient way to get a response.  Please allow 48 business hours for a response.  Please remember that this is for non-urgent requests.  _______________________________________________________  Isaiah Jones have been scheduled for an endoscopy. Please follow written instructions given to you at your visit today. If you use inhalers (even only as needed), please bring them with you on the day of your procedure.  You have been scheduled for an esophageal manometry at [facility] on 03/22/2022 at 10:30 am. Please arrive 30 minutes prior to your procedure for registration. You will need to go to outpatient registration (1st floor of the hospital) first. Make certain to bring your insurance cards as well as a complete list of medications.  Please remember the following:  1) Do not take any muscle relaxants, xanax (alprazolam) or ativan for 1 day prior to your test as well as the day of the test.  2) Nothing to eat or drink for 8 hours before your test.  3) Hold all diabetic medications/insulin the morning of the test. You may eat and take your medications after the test.  It will take at least 2 weeks to receive the results of this test from your physician. ------------------------------------------ ABOUT ESOPHAGEAL MANOMETRY Esophageal manometry (muh-NOM-uh-tree) is a test that gauges how well your esophagus works. Your esophagus is the long, muscular tube that connects your throat  to your stomach. Esophageal manometry measures the rhythmic muscle contractions (peristalsis) that occur in your esophagus when you swallow. Esophageal manometry also measures the coordination and force exerted by the muscles of your esophagus.  During esophageal manometry, a thin, flexible tube (catheter) that contains sensors is passed through your nose, down your esophagus and into your stomach. Esophageal manometry can be helpful in diagnosing some mostly uncommon disorders that affect your esophagus.  Why it's done Esophageal manometry is used to evaluate the movement (motility) of food through the esophagus and into the stomach. The test measures how well the circular bands of muscle (sphincters) at the top and bottom of your esophagus open and close, as well as the pressure, strength and pattern of the wave of esophageal muscle contractions that moves food along.  What you can expect Esophageal manometry is an outpatient procedure done without sedation. Most people tolerate it well. You may be asked to change into a hospital gown before the test starts.  During esophageal manometry  While you are sitting up, a member of your health care team sprays your throat with a numbing medication or puts numbing gel in your nose or both.  A catheter is guided through your nose into your esophagus. The catheter may be sheathed in a water-filled sleeve. It doesn't interfere with your breathing. However, your eyes may water, and you may gag. You may have a slight nosebleed from irritation.  After the catheter is in place, you may be asked to lie on your back on an exam table, or you may be asked to remain seated.  You then swallow small sips of water.  As you do, a computer connected to the catheter records the pressure, strength and pattern of your esophageal muscle contractions.  During the test, you'll be asked to breathe slowly and smoothly, remain as still as possible, and swallow only when you're asked to  do so.  A member of your health care team may move the catheter down into your stomach while the catheter continues its measurements.  The catheter then is slowly withdrawn. The test usually lasts 20 to 30 minutes.  After esophageal manometry  When your esophageal manometry is complete, you may return to your normal activities  This test typically takes 30-45 minutes to complete. ________________________________________________________________________________  Isaiah Jones will be contacted by our office prior to your procedure for directions on holding your Xarelto.  If you do not hear from our office 1 week prior to your scheduled procedure, please call [phone] to discuss.   Try to stay up right for 2-3 hours after eating  Eat small bites and chew carefully Eat slowly, sip water between bites.  Follow up pending at this time.  Thank you for entrusting me with your care and choosing [facility].  Amy Esterwood, PA-C

## 2021-11-24 NOTE — Progress Notes (Addendum)
Subjective:    Patient ID: Isaiah Jones, male    DOB: 11-05-1950, 71 y.o.   MRN: [id number]  HPI Isaiah Jones is a pleasant 71 year old white male, new to GI today referred by Dr. Olga Coaster after he was seen there for complaints of dysphagia and underwent barium swallow. Patient says he had previously been seen by GI in [place] but that was 5 or 6 years ago at which point he had a colonoscopy.  He does not remember the physician's name, believes that he had a couple of polyps and is uncertain what he was told about follow-up. He has not had prior EGD. He did undergo a sleeve gastrectomy per Dr. Hassell Done in 2020 and has successfully lost about 86 pounds.  He has had some vague dysphagia complaints for a few years.  Says in the past he had been told to eat slower and eat smaller pieces of food which had helped his symptoms.  He had gotten away from that and has resumed these eating habits and is not having any significant problems currently.  He has noticed though sometimes hours after eating like if he eats a tossed salad he may possibly wake up the following morning and bring up a small amount of salad.  He has no complaints of heartburn or indigestion, appetite is fine.  No difficulty with liquids.  If he eats larger pieces of food or eats faster he may have dysphagia symptoms with solids.  He says sometimes if he eats too fast he may regurgitate the food back up.  He had barium swallow done June 2023 that shows moderate dilation of the thoracic esophagus, severe smoothly tapered narrowing of the distal thoracic esophagus and GE junction with "BirdBeak" appearance and significant delay to passage of barium.  Rule out achalasia versus stricture.  Reviewing his records he also had an upper GI in 2020 prior to the sleeve gastrectomy and this showed a dilated patulous esophagus with distal esophageal narrowing resulting in marked delay clearing the esophagus, could not rule out distal esophageal  stricture, and endoscopy was recommended.  Review of Systems. Pertinent positive and negative review of systems were noted in the above HPI section.  All other review of systems was otherwise negative.   Outpatient Encounter Medications as of 11/24/2021  Medication Sig   acetaminophen (TYLENOL) 500 MG tablet Take 500 mg by mouth every 6 (six) hours as needed (for pain.).    amLODipine (NORVASC) 5 MG tablet TAKE 1 TABLET DAILY   azelastine (ASTELIN) 0.1 % nasal spray Place 2 sprays into both nostrils 2 (two) times daily.   Calcium Carb-Cholecalciferol (CALCIUM-VITAMIN D) 600-400 MG-UNIT TABS Take 1 tablet by mouth in the morning, at noon, and at bedtime.   diphenhydramine-acetaminophen (TYLENOL PM) 25-500 MG TABS tablet Take 2 tablets by mouth at bedtime.    fexofenadine (ALLEGRA) 180 MG tablet Take 180 mg by mouth daily as needed for allergies or rhinitis.   Ginkgo Biloba 60 MG TABS Take 60 mg by mouth daily.   latanoprost (XALATAN) 0.005 % ophthalmic solution Place 1 drop into both eyes at bedtime.    methocarbamol (ROBAXIN) 750 MG tablet Take 1 tablet (750 mg total) by mouth every 6 (six) hours as needed for muscle spasms.   metoprolol succinate (TOPROL-XL) 25 MG 24 hr tablet TAKE 1 AND 1/2 TABLETS     DAILY WITH OR IMMEDIATELY  FOLLOWING A MEAL   mometasone (NASONEX) 50 MCG/ACT nasal spray Place 1 spray into the nose at  bedtime.   Multiple Vitamin (MULTIVITAMIN WITH MINERALS) TABS tablet Take 2 tablets by mouth daily. Bariatric Multivitamin   Multiple Vitamins-Minerals (AIRBORNE PO) Take 1 tablet by mouth daily as needed (immune health).    PENNSAID 2 % SOLN Apply 1 Pump topically 4 (four) times daily as needed (aching joints).    rosuvastatin (CRESTOR) 20 MG tablet TAKE 1 TABLET DAILY   senna-docusate (SENOKOT-S) 8.6-50 MG tablet Take 1 tablet by mouth 2 (two) times daily. While taking strong pain meds to prevent constipation   sertraline (ZOLOFT) 50 MG tablet TAKE 1 TABLET DAILY    tadalafil (CIALIS) 10 MG tablet Take 1 tablet (10 mg total) by mouth daily as needed for erectile dysfunction.   Thiamine HCl (VITAMIN B1 PO) Take by mouth.   XARELTO 20 MG TABS tablet TAKE 1 TABLET DAILY WITH   SUPPER   [DISCONTINUED] PEG-KCl-NaCl-NaSulf-Na Asc-C (PLENVU) 140 g SOLR Take 1 kit by mouth as directed.   No facility-administered encounter medications on file as of 11/24/2021.   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 12/05/2018   Morbid obesity ([place]) 12/05/2018   Persistent atrial fibrillation ([facility])    OSA on CPAP    Dyslipidemia    Social History   Socioeconomic History   Marital status: Married    Spouse name: Not on file   Number of children: Not on file   Years of education: Not on file   Highest education level: Not on file  Occupational History   Occupation: Retired   Tobacco Use   Smoking status: Former    Types: Cigarettes    Quit date: 07/04/2001    Years since quitting: 20.4   Smokeless tobacco: Never  Vaping Use   Vaping Use: Never used  Substance and Sexual Activity   Alcohol use: Yes    Comment: rare   Drug use: Never   Sexual activity: Yes    Comment: MARRIED  Other Topics Concern   Not on file  Social History Narrative   Lives in [place], [place]   Married   2 kids   Retired - Prior worked at [employer] Strain: Not on [employer] Insecurity: Not on file  Transportation Needs: Not on file  Physical Activity: Not on file   Stress: Not on file  Social Connections: Not on file  Intimate Partner Violence: Not on file    Mr. Ahles's family history includes Alzheimer's disease in his mother; Arthritis in his father and mother; Heart attack in his mother; Heart attack (age of onset: 41) in his father; Hyperlipidemia in his brother, father, and mother; Hypertension in his brother, father, and mother; Other in his brother.      Objective:    Vitals:   11/24/21 1013  BP: 118/78  Pulse: 70  SpO2: 98%    Physical Exam Well-developed well-nourished older white male in no acute distress.  Height, Weight, 309 BMI 39.6 -very pleasant  HEENT; nontraumatic normocephalic, EOMI, PE R LA, sclera anicteric. Oropharynx; not examined  today Neck; supple, no JVD Cardiovascular; regular rate and rhythm with S1-S2, no murmur rub or gallop Pulmonary; Clear bilaterally Abdomen; soft, obese, nontender nondistended, no palpable mass or hepatosplenomegaly, bowel sounds are active Rectal; not done today Skin; benign exam, no jaundice rash or appreciable lesions Extremities; no clubbing cyanosis or edema skin warm and dry-bilateral knee repairs Neuro/Psych; alert and oriented x4, grossly nonfocal mood and affect appropriate        Assessment & Plan:   #57 71 year old white male with intermittent solid food dysphagia, occasional regurgitation.  Denies heartburn or indigestion Recent barium swallow shows a moderately dilated thoracic esophagus with severe smoothly tapered narrowing of the distal thoracic esophagus and GE junction with BirdBeak appearance and subsequent significant delay to passage of barium.  No esophageal peristalsis noted.  Findings concerning for achalasia, consider significant distal esophageal stricture which seems less likely as his symptoms are mild.  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  History of atrial fibrillation #5.  Sleep apnea with CPAP use, no oxygen #6 osteoarthritis  #7 hypertension #8 colon cancer surveillance-patient reports prior history of polyps, type unknown colonoscopy 5 to 6 years ago done in [place]/physician name not known  Plan; advised he continue cutting food into small pieces, chewing carefully, eating slowly, sipping fluids between bites, and remaining upright for 2 to 3 hours after meals Patient will be scheduled for upper endoscopy with Dr. Lorenso Courier.  Procedure was discussed in detail with the patient including indications risks and benefits.  Possible dilation if felt indicated He will need to hold Xarelto for 24 hours prior to procedure.  We will communicate with his cardiologist Dr. Irish Lack to assure this is reasonable for this patient.  He will also be scheduled for manometry  Patient is signed a release and will obtain his prior records from [facility] gastro[facility]ology, then determine timing of a follow-up colonoscopy.  Further recommendations pending findings at EGD and manometry.  Addendum; records were received from [facility] gastroenterology Dr. Earley Brooke Colonoscopy October 2016 with finding of 2 small ulcers in the mid and transverse colon which were biopsied and 2 to 3 mm colon polyps removed from the rectum. Biopsies showed nonspecific chronic inflammation of the distal transverse colon and a hyperplastic polyp of the rectum.  He would be indicated for 10-year interval follow-up based on that report.    Yamir Carignan S Mikylah Ackroyd PA-C 11/24/2021   Cc: Izora Gala, MD

## 2021-11-24 NOTE — Telephone Encounter (Signed)
[facility] Pre-operative Risk Assessment     Request for surgical clearance:     Endoscopy Procedure  What type of surgery is being performed?     Endoscopy  When is this surgery scheduled?     01/05/2022  What type of clearance is required ?   Pharmacy  Are there any medications that need to be held prior to surgery and how long? Xarelto starting 24 hours prior  Practice name and name of physician performing surgery?      [facility]  What is your office phone and fax number?      Phone- [phone]  [phone]  Anesthesia type (None, local, MAC, general) ?       MAC

## 2021-11-25 NOTE — Telephone Encounter (Signed)
   Patient Name: Isaiah Jones  DOB: 07-Aug-1950 MRN: [id number]  Primary Cardiologist: Larae Grooms, MD  Clinical pharmacist have reviewed the patient's past medical history, labs, and current medications as part of pre-operative protocol coverage.   The following recommendations have been made:   Patient with diagnosis of afib on Xarelto for anticoagulation.     Procedure: endoscopy Date of procedure: 01/05/22   CHA2DS2-VASc Score = 3  This indicates a 3.2% annual risk of stroke. The patient's score is based upon: CHF History: 1 HTN History: 1 Diabetes History: 0 Stroke History: 0 Vascular Disease History: 0 Age Score: 1 Gender Score: 0   CrCl >178m/min Platelet count 209K   Per office protocol, patient can hold Xarelto for 1 day prior to procedure as requested.    I will route this recommendation to the requesting party via Epic fax function and remove from pre-op pool.  Please call with questions.  ELenna Sciara NP 11/25/2021, 11:17 AM

## 2021-11-25 NOTE — Telephone Encounter (Signed)
Patient with diagnosis of afib on Xarelto for anticoagulation.    Procedure: endoscopy Date of procedure: 01/05/22  CHA2DS2-VASc Score = 3  This indicates a 3.2% annual risk of stroke. The patient's score is based upon: CHF History: 1 HTN History: 1 Diabetes History: 0 Stroke History: 0 Vascular Disease History: 0 Age Score: 1 Gender Score: 0  CrCl >119m/min Platelet count 209K  Per office protocol, patient can hold Xarelto for 1 day prior to procedure as requested.    **This guidance is not considered finalized until pre-operative APP has relayed final recommendations.**

## 2021-11-28 ENCOUNTER — Telehealth: Payer: Self-pay

## 2021-11-28 NOTE — Telephone Encounter (Signed)
Progress Notes  Sharyn Creamer, MD at 11/24/2021 10:30 AM  Status: Signed  I agree with the assessment and plan as outlined by Ms. Juncos. Please have the patient hold his Xarelto for at least 2 days before his EGD procedure in case dilation of an esophageal stricture is needed.      Patient contacted and advised.

## 2021-12-08 NOTE — Telephone Encounter (Signed)
Called and spoke with patient to verify that he is aware of holding his Xarelto 2 days prior to his procedure. He confirmed that he is aware

## 2021-12-25 ENCOUNTER — Other Ambulatory Visit: Payer: Self-pay | Admitting: Family Medicine

## 2022-01-04 ENCOUNTER — Telehealth: Payer: Self-pay | Admitting: Internal Medicine

## 2022-01-04 NOTE — Telephone Encounter (Signed)
Inbound call from patient stating that he is scheduled for a EGD tomorrow at 9:30 with Dr. Lorenso Courier. Patient stated that he does not know if he has a head cold or if it is just his sinuses from mowing his yard the other day. He has not been running a fever, but is requesting a call back to discuss if he is okay to have procedure tomorrow. Please advise.

## 2022-01-04 NOTE — Telephone Encounter (Signed)
Pt called this morning c/o "my head feeling like its in a barrel." He had mowed grass this past weekend and feels like that contributed to it. He isn't running a fever and is scheduled for an EGD tomorrow at 9:30 with Dr. Lorenso Courier. Told him if he started running a fever to call us back and we would reschedule his procedure.

## 2022-01-05 ENCOUNTER — Ambulatory Visit (AMBULATORY_SURGERY_CENTER): Payer: Medicare Other | Admitting: Internal Medicine

## 2022-01-05 ENCOUNTER — Encounter: Payer: Medicare Other | Admitting: Internal Medicine

## 2022-01-05 ENCOUNTER — Encounter: Payer: Self-pay | Admitting: Internal Medicine

## 2022-01-05 VITALS — BP 137/86 | HR 74 | Temp 96.2°F | Resp 17 | Ht 74.0 in | Wt 300.0 lb

## 2022-01-05 DIAGNOSIS — K2960 Other gastritis without bleeding: Secondary | ICD-10-CM

## 2022-01-05 DIAGNOSIS — K219 Gastro-esophageal reflux disease without esophagitis: Secondary | ICD-10-CM | POA: Diagnosis not present

## 2022-01-05 DIAGNOSIS — K31A0 Gastric intestinal metaplasia, unspecified: Secondary | ICD-10-CM

## 2022-01-05 DIAGNOSIS — K2980 Duodenitis without bleeding: Secondary | ICD-10-CM

## 2022-01-05 DIAGNOSIS — R1310 Dysphagia, unspecified: Secondary | ICD-10-CM

## 2022-01-05 DIAGNOSIS — K31A11 Gastric intestinal metaplasia without dysplasia, involving the antrum: Secondary | ICD-10-CM | POA: Diagnosis not present

## 2022-01-05 DIAGNOSIS — K2970 Gastritis, unspecified, without bleeding: Secondary | ICD-10-CM

## 2022-01-05 MED ORDER — OMEPRAZOLE 40 MG PO CPDR: 40.0000 mg | DELAYED_RELEASE_CAPSULE | Freq: Two times a day (BID) | ORAL | 0 refills | Status: DC

## 2022-01-05 MED ORDER — SODIUM CHLORIDE 0.9 % IV SOLN: 500.0000 mL | INTRAVENOUS | Status: DC

## 2022-01-05 NOTE — Patient Instructions (Addendum)
Await pathology results from the biopsies taken today.  Restart Xarelto tomorrow at prior dose.  Continue to chew food carefully and swallow fully with each bite.  Eat sitting upright.  Use Prilosec 40 mg by mouth twice a day for 8 weeks.   YOU HAD AN ENDOSCOPIC PROCEDURE TODAY AT [facility]:   Refer to the procedure report that was given to you for any specific questions about what was found during the examination.  If the procedure report does not answer your questions, please call your gastroenterologist to clarify.  If you requested that your care partner not be given the details of your procedure findings, then the procedure report has been included in a sealed envelope for you to review at your convenience later.  YOU SHOULD EXPECT: Some feelings of bloating in the abdomen. Passage of more gas than usual.  Walking can help get rid of the air that was put into your GI tract during the procedure and reduce the bloating. If you had a lower endoscopy (such as a colonoscopy or flexible sigmoidoscopy) you may notice spotting of blood in your stool or on the toilet paper. If you underwent a bowel prep for your procedure, you may not have a normal bowel movement for a few days.  Please Note:  You might notice some irritation and congestion in your nose or some drainage.  This is from the oxygen used during your procedure.  There is no need for concern and it should clear up in a day or so.  SYMPTOMS TO REPORT IMMEDIATELY:   Following upper endoscopy (EGD)  Vomiting of blood or coffee ground material  New chest pain or pain under the shoulder blades  Painful or persistently difficult swallowing  New shortness of breath  Fever of 100F or higher  Black, tarry-looking stools  For urgent or emergent issues, a gastroenterologist can be reached at any hour by calling [phone]. Do not use MyChart messaging for urgent concerns.    DIET:  We do recommend a small meal at  first, but then you may proceed to your regular diet.  Drink plenty of fluids but you should avoid alcoholic beverages for 24 hours.  ACTIVITY:  You should plan to take it easy for the rest of today and you should NOT DRIVE or use heavy machinery until tomorrow (because of the sedation medicines used during the test).    FOLLOW UP: Our staff will call the number listed on your records the next business day following your procedure.  We will call around 7:15- 8:00 am to check on you and address any questions or concerns that you may have regarding the information given to you following your procedure. If we do not reach you, we will leave a message.  If you develop any symptoms (ie: fever, flu-like symptoms, shortness of breath, cough etc.) before then, please call [phone].  If you test positive for Covid 19 in the 2 weeks post procedure, please call and report this information to [place].    If any biopsies were taken you will be contacted by phone or by letter within the next 1-3 weeks.  Please call us at [phone] if you have not heard about the biopsies in 3 weeks.    SIGNATURES/CONFIDENTIALITY: You and/or your care partner have signed paperwork which will be entered into your electronic medical record.  These signatures attest to the fact that that the information above on your After Visit Summary has been reviewed and  is understood.  Full responsibility of the confidentiality of this discharge information lies with you and/or your care-partner.

## 2022-01-05 NOTE — Progress Notes (Signed)
Pt in recovery with monitors in place, VSS. Report given to receiving RN. Bite guard was placed with pt awake to ensure comfort. No dental or soft tissue damage noted. 

## 2022-01-05 NOTE — Progress Notes (Signed)
Called to room to assist during endoscopic procedure.  Patient ID and intended procedure confirmed with present staff. Received instructions for my participation in the procedure from the performing physician.  

## 2022-01-05 NOTE — Op Note (Signed)
[facility] Patient Name: Isaiah Jones Procedure Date: 01/05/2022 10:22 AM MRN: [id number] Endoscopist: Isaiah Jones "Isaiah Jones ,  Age: 71 Referring MD:  Date of Birth: February 14, 1951 Gender: Male Account #: [id number] Procedure:                Upper GI endoscopy Indications:              Dysphagia, abnormal barium swallow Medicines:                Monitored Anesthesia Care Procedure:                Pre-Anesthesia Assessment:                           - Prior to the procedure, a History and Physical                            was performed, and patient medications and                            allergies were reviewed. The patient's tolerance of                            previous anesthesia was also reviewed. The risks                            and benefits of the procedure and the sedation                            options and risks were discussed with the patient.                            All questions were answered, and informed consent                            was obtained. Prior Anticoagulants: The patient has                            taken Xarelto (rivaroxaban), last dose was 3 days                            prior to procedure. ASA Grade Assessment: III - A                            patient with severe systemic disease. After                            reviewing the risks and benefits, the patient was                            deemed in satisfactory condition to undergo the                            procedure.  After obtaining informed consent, the endoscope was                            passed under direct vision. Throughout the                            procedure, the patient's blood pressure, pulse, and                            oxygen saturations were monitored continuously. The                            Endoscope was introduced through the mouth, and                            advanced to the second part of duodenum. The upper                             GI endoscopy was accomplished without difficulty.                            The patient tolerated the procedure well. Scope In: Scope Out: Findings:                 The lumen of the mid esophagus and distal esophagus                            was moderately dilated. Biopsies were taken with a                            cold forceps for histology.                           Esophagogastric landmarks were identified: the                            gastroesophageal junction was found at 47 cm from                            the incisors.                           Since there was mild resistance while pushing                            through the GE junction, a TTS dilator was passed                            through the scope. Dilation with an 18-19-20 mm                            balloon dilator was performed to 20 mm at the  gastroesophageal junction. No mucosal disruption                            was seen.                           Localized inflammation characterized by congestion                            (edema), erosions and erythema was found in the                            gastric antrum. Biopsies were taken with a cold                            forceps for histology.                           Localized inflammation characterized by congestion                            (edema) and erythema was found in the duodenal                            bulb. Biopsies were taken with a cold forceps for                            histology. Complications:            No immediate complications. Estimated Blood Loss:     Estimated blood loss was minimal. Impression:               - Dilation in the mid esophagus and in the distal                            esophagus. Biopsied.                           - Esophagogastric landmarks identified.                           - Mild resistance at the GE junction. Dilation                             performed at the gastroesophageal junction.                           - Gastritis. Biopsied.                           - Duodenitis. Biopsied. Recommendation:           - Discharge patient to home (with escort).                           - Use Prilosec (omeprazole) 40 mg PO BID for 8  weeks.                           - Await pathology results.                           - Continue aspiration precautions by chewing food                            carefully, swallowing fully before eat bite, eating                            while fully upright.                           - Okay to restart Xarelto tomorrow.                           - The findings and recommendations were discussed                            with the patient. Dr Isaiah Jones "Isaiah Jones" [place],  01/05/2022 10:52:25 AM

## 2022-01-05 NOTE — Progress Notes (Signed)
GASTROENTEROLOGY PROCEDURE H&P NOTE   Primary Care Physician: Vivi Barrack, MD    Reason for Procedure:   Dysphagia, history of gastric sleeve  Plan:    EGD  Patient is appropriate for endoscopic procedure(s) in the ambulatory ([facility]) setting.  The nature of the procedure, as well as the risks, benefits, and alternatives were carefully and thoroughly reviewed with the patient. Ample time for discussion and questions allowed. The patient understood, was satisfied, and agreed to proceed.     HPI: Isaiah Jones is a 71 y.o. male who presents for EGD for evaluation of dysphagia and history of gastric sleeve .  Patient was most recently seen in the Gastroenterology Clinic on 11/24/21.  No interval change in medical history since that appointment. Please refer to that note for full details regarding GI history and clinical presentation.   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    CHF (congestive heart failure) (HCC)    Colon polyps    Complication of anesthesia    Depression    Dyslipidemia    Dysrhythmia 2017   A fib   GERD (gastroesophageal reflux disease)    Glaucoma    early stage   History of 2019 novel coronavirus disease (COVID-19) 06/2019   History of kidney stones    Hypertension    OSA on CPAP    C-Pap   Persistent atrial fibrillation ([place])    Echo 3/18: Mild LVH, EF 55-60, normal wall motion, grade 1 diastolic dysfunction, mild LAE   Pneumonia    childhood   PONV (postoperative nausea and vomiting)    after 1st knee surgery only   Rhinitis, allergic     Past Surgical History:  Procedure Laterality Date   arthroscopic knee surgery Bilateral 2003   CARDIOVERSION N/A 08/10/2016   Procedure: CARDIOVERSION;  Surgeon: Pixie Casino, MD;  Location: [facility];  Service: Cardiovascular;  Laterality: N/A;   COLONOSCOPY      CYSTOSCOPY WITH RETROGRADE PYELOGRAM, URETEROSCOPY AND STENT PLACEMENT Left 03/04/2021   Procedure: CYSTOSCOPY WITH BILATERAL RETROGRADES,PYELOGRAM, URETEROSCOPY AND STENT PLACEMENT;  Surgeon: Alexis Frock, MD;  Location: [facility];  Service: Urology;  Laterality: Left;  1 HR   HOLMIUM LASER APPLICATION Left 38/06/5051   Procedure: HOLMIUM LASER APPLICATION;  Surgeon: Alexis Frock, MD;  Location: [facility];  Service: Urology;  Laterality: Left;   LAPAROSCOPIC GASTRIC SLEEVE RESECTION N/A 07/15/2019   Procedure: LAPAROSCOPIC GASTRIC SLEEVE RESECTION, Upper Endo, ERAS Pathway;  Surgeon: [name], MD;  Location: [facility];  Service: General;  Laterality: N/A;   REPLACEMENT TOTAL KNEE Bilateral 2008   TONSILLECTOMY     TOTAL HIP ARTHROPLASTY Right 10/21/2019   Procedure: RIGHT TOTAL HIP ARTHROPLASTY ANTERIOR APPROACH;  Surgeon: Melrose Nakayama, MD;  Location: [facility];  Service: Orthopedics;  Laterality: Right;   VASECTOMY      Prior to Admission medications   Medication Sig Start Date End Date Taking? Authorizing Provider  acetaminophen (TYLENOL) 500 MG tablet Take 500 mg by mouth every 6 (six) hours as needed (for pain.).    Yes [provider]  amLODipine (NORVASC) 5 MG tablet TAKE 1 TABLET DAILY 12/26/21  Yes Vivi Barrack, MD  azelastine (ASTELIN) 0.1 % nasal spray Place 2 sprays into both nostrils 2 (two) times daily. 06/14/21  Yes Vivi Barrack, MD  Calcium Carb-Cholecalciferol (CALCIUM-VITAMIN D) 600-400  MG-UNIT TABS Take 1 tablet by mouth in the morning, at noon, and at bedtime.   Yes [provider]  diphenhydramine-acetaminophen (TYLENOL PM) 25-500 MG TABS tablet Take 2 tablets by mouth at bedtime.    Yes [provider]  fexofenadine (ALLEGRA) 180 MG tablet Take 180 mg by mouth daily as needed for allergies or rhinitis.   Yes [provider]  Ginkgo Biloba 60 MG TABS Take 60 mg by mouth daily.   Yes [provider]  latanoprost (XALATAN)  0.005 % ophthalmic solution Place 1 drop into both eyes at bedtime.  01/31/17  Yes [provider]  metoprolol succinate (TOPROL-XL) 25 MG 24 hr tablet TAKE 1 AND 1/2 TABLETS     DAILY WITH OR IMMEDIATELY  FOLLOWING A MEAL 03/18/21  Yes Jettie Booze, MD  mometasone (NASONEX) 50 MCG/ACT nasal spray Place 1 spray into the nose at bedtime.   Yes [provider]  Multiple Vitamin (MULTIVITAMIN WITH MINERALS) TABS tablet Take 2 tablets by mouth daily. Bariatric Multivitamin   Yes [provider]  Multiple Vitamins-Minerals (AIRBORNE PO) Take 1 tablet by mouth daily as needed (immune health).    Yes [provider]  rosuvastatin (CRESTOR) 20 MG tablet TAKE 1 TABLET DAILY 01/17/21  Yes Vivi Barrack, MD  sertraline (ZOLOFT) 50 MG tablet TAKE 1 TABLET DAILY 08/31/21  Yes Vivi Barrack, MD  Thiamine HCl (VITAMIN B1 PO) Take by mouth.   Yes [provider]  methocarbamol (ROBAXIN) 750 MG tablet Take 1 tablet (750 mg total) by mouth every 6 (six) hours as needed for muscle spasms. 10/22/19   Loni Dolly, PA-C  PENNSAID 2 % SOLN Apply 1 Pump topically 4 (four) times daily as needed (aching joints).  02/02/17   [provider]  senna-docusate (SENOKOT-S) 8.6-50 MG tablet Take 1 tablet by mouth 2 (two) times daily. While taking strong pain meds to prevent constipation 03/04/21   Alexis Frock, MD  tadalafil (CIALIS) 10 MG tablet Take 1 tablet (10 mg total) by mouth daily as needed for erectile dysfunction. 12/05/18   Vivi Barrack, MD  XARELTO 20 MG TABS tablet TAKE 1 TABLET DAILY WITH   SUPPER 11/14/21   Jettie Booze, MD    Current Outpatient Medications  Medication Sig Dispense Refill   acetaminophen (TYLENOL) 500 MG tablet Take 500 mg by mouth every 6 (six) hours as needed (for pain.).      amLODipine (NORVASC) 5 MG tablet TAKE 1 TABLET DAILY 90 tablet 1   azelastine (ASTELIN) 0.1 % nasal spray Place 2 sprays into both nostrils 2 (two)  times daily. 30 mL 12   Calcium Carb-Cholecalciferol (CALCIUM-VITAMIN D) 600-400 MG-UNIT TABS Take 1 tablet by mouth in the morning, at noon, and at bedtime.     diphenhydramine-acetaminophen (TYLENOL PM) 25-500 MG TABS tablet Take 2 tablets by mouth at bedtime.      fexofenadine (ALLEGRA) 180 MG tablet Take 180 mg by mouth daily as needed for allergies or rhinitis.     Ginkgo Biloba 60 MG TABS Take 60 mg by mouth daily.     latanoprost (XALATAN) 0.005 % ophthalmic solution Place 1 drop into both eyes at bedtime.      metoprolol succinate (TOPROL-XL) 25 MG 24 hr tablet TAKE 1 AND 1/2 TABLETS     DAILY WITH OR IMMEDIATELY  FOLLOWING A MEAL 135 tablet 3   mometasone (NASONEX) 50 MCG/ACT nasal spray Place 1 spray into the nose at bedtime.  Multiple Vitamin (MULTIVITAMIN WITH MINERALS) TABS tablet Take 2 tablets by mouth daily. Bariatric Multivitamin     Multiple Vitamins-Minerals (AIRBORNE PO) Take 1 tablet by mouth daily as needed (immune health).      rosuvastatin (CRESTOR) 20 MG tablet TAKE 1 TABLET DAILY 90 tablet 3   sertraline (ZOLOFT) 50 MG tablet TAKE 1 TABLET DAILY 90 tablet 1   Thiamine HCl (VITAMIN B1 PO) Take by mouth.     methocarbamol (ROBAXIN) 750 MG tablet Take 1 tablet (750 mg total) by mouth every 6 (six) hours as needed for muscle spasms. 40 tablet 1   PENNSAID 2 % SOLN Apply 1 Pump topically 4 (four) times daily as needed (aching joints).   1   senna-docusate (SENOKOT-S) 8.6-50 MG tablet Take 1 tablet by mouth 2 (two) times daily. While taking strong pain meds to prevent constipation 10 tablet 0   tadalafil (CIALIS) 10 MG tablet Take 1 tablet (10 mg total) by mouth daily as needed for erectile dysfunction. 30 tablet 0   XARELTO 20 MG TABS tablet TAKE 1 TABLET DAILY WITH   SUPPER 90 tablet 1   Current Facility-Administered Medications  Medication Dose Route Frequency Provider Last Rate Last Admin   0.9 %  sodium chloride infusion  500 mL Intravenous Continuous Sharyn Creamer,  MD        Allergies as of 01/05/2022   (No Known Allergies)    Family History  Problem Relation Age of Onset   Alzheimer's disease Mother    Heart attack Mother    Hyperlipidemia Mother    Hypertension Mother    Arthritis Mother    Esophageal cancer Father    Heart attack Father 68   Arthritis Father    Hyperlipidemia Father    Hypertension Father    Hypertension Brother    Hyperlipidemia Brother    Other Brother        CAR ACCIDENT   Atrial fibrillation Neg Hx     Social History   Socioeconomic History   Marital status: Married    Spouse name: Not on file   Number of children: Not on file   Years of education: Not on file   Highest education level: Not on file  Occupational History   Occupation: Retired   Tobacco Use   Smoking status: Former    Types: Cigarettes    Quit date: 07/04/2001    Years since quitting: 20.5   Smokeless tobacco: Never  Vaping Use   Vaping Use: Never used  Substance and Sexual Activity   Alcohol use: Yes    Comment: rare   Drug use: Never   Sexual activity: Yes    Comment: MARRIED  Other Topics Concern   Not on file  Social History Narrative   Lives in [place], [place]   Married   2 kids   Retired - Prior worked at [employer] Strain: Not on [employer] Insecurity: Not on Pensions consultant Needs: Not on file  Physical Activity: Not on file  Stress: Not on file  Social Connections: Not on file  Intimate Partner Violence: Not on file    Physical Exam: Vital signs in last 24 hours: BP 126/74   Pulse (!) 58   Temp (!) 96.2 F (35.7 C)   Ht '6\' 2"'$  (1.88 m)   Wt 300 lb (136.1 kg)   SpO2 96%   BMI 38.52 kg/m  GEN: NAD EYE: Sclerae anicteric  ENT: MMM CV: Non-tachycardic Pulm: No increased WOB GI: Soft NEURO:  Alert & Oriented   [name], MD [facility]   01/05/2022 9:24 AM

## 2022-01-06 ENCOUNTER — Telehealth: Payer: Self-pay

## 2022-01-06 NOTE — Telephone Encounter (Signed)
  Follow up Call-     01/05/2022    8:50 AM  Call back number  Post procedure Call Back phone  # [phone]  Permission to leave phone message Yes     Patient questions:  Do you have a fever, pain , or abdominal swelling? No. Pain Score  0 *  Have you tolerated food without any problems? Yes.    Have you been able to return to your normal activities? Yes.    Do you have any questions about your discharge instructions: Diet   No. Medications  No. Follow up visit  No.  Do you have questions or concerns about your Care? No.  Actions: * If pain score is 4 or above: No action needed, pain <4.

## 2022-01-10 ENCOUNTER — Telehealth: Payer: Self-pay | Admitting: Family Medicine

## 2022-01-10 NOTE — Telephone Encounter (Signed)
Patient states: -He has been battling a head cold for the last week  - He is experiencing sneezing,coughing, head congestion, and sinus drainage  - He took a COVID test yesterday evening which was negative    Patient requests: - Dr. Jerline Pain send in something such as a Z-pack that could help with symptoms   I advised pt on a OV but he declined.

## 2022-01-11 ENCOUNTER — Telehealth: Payer: Self-pay | Admitting: Internal Medicine

## 2022-01-11 ENCOUNTER — Encounter: Payer: Self-pay | Admitting: Internal Medicine

## 2022-01-11 NOTE — Telephone Encounter (Signed)
Pt declined to come in stating he would rather just ride it out.

## 2022-01-11 NOTE — Telephone Encounter (Signed)
Inbound call from patient wondering if his rx omeprazole will flare up his gout. Please advise, [phone]

## 2022-01-11 NOTE — Telephone Encounter (Signed)
Spoke with the patient. He has a history of gout. Reports it typically occurs in his feet, however he thinks this time it is flaring in his hands. Advised omeprazole is not known to cause this type of issue. Strongly encouraged to reach out to his PCP for guidance on his issue. Patient agrees to do this.

## 2022-01-11 NOTE — Telephone Encounter (Signed)
Patient need OV 

## 2022-01-18 ENCOUNTER — Other Ambulatory Visit: Payer: Self-pay | Admitting: Family Medicine

## 2022-01-23 ENCOUNTER — Telehealth: Payer: Self-pay | Admitting: Internal Medicine

## 2022-01-23 ENCOUNTER — Encounter: Payer: Self-pay | Admitting: *Deleted

## 2022-01-23 NOTE — Telephone Encounter (Signed)
Spoke with the patient about his biopsy results. 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 Discussed healthy lifestyle as the thing that he can control that will contribute to his overall health.  Is there anything you would recommend to him for this condition?

## 2022-01-23 NOTE — Telephone Encounter (Signed)
Inbound call from patient stating that he received a letter in the mail in regards to the biopsies that were taken. Patient is requesting a call back to discuss. Please advise.

## 2022-01-24 NOTE — Telephone Encounter (Signed)
Patient is aware 

## 2022-02-03 ENCOUNTER — Telehealth: Payer: Self-pay | Admitting: Family Medicine

## 2022-02-03 NOTE — Telephone Encounter (Signed)
Patient states he would like to transfer care from Dr. Jerline Pain to Dr. Randol Kern. Is this okay with you?

## 2022-02-03 NOTE — Telephone Encounter (Signed)
See Dr Jerline Pain note

## 2022-02-03 NOTE — Telephone Encounter (Signed)
Yes that is ok with me!  Isaiah Jones. Jerline Pain, MD 02/03/2022 10:02 AM

## 2022-02-17 ENCOUNTER — Ambulatory Visit: Payer: Medicare Other | Admitting: Internal Medicine

## 2022-02-17 ENCOUNTER — Encounter: Payer: Self-pay | Admitting: Internal Medicine

## 2022-02-17 VITALS — BP 119/71 | HR 54 | Temp 97.5°F | Ht 74.0 in | Wt 308.8 lb

## 2022-02-17 DIAGNOSIS — R053 Chronic cough: Secondary | ICD-10-CM | POA: Diagnosis not present

## 2022-02-17 DIAGNOSIS — R1310 Dysphagia, unspecified: Secondary | ICD-10-CM | POA: Insufficient documentation

## 2022-02-17 DIAGNOSIS — I10 Essential (primary) hypertension: Secondary | ICD-10-CM

## 2022-02-17 DIAGNOSIS — I4819 Other persistent atrial fibrillation: Secondary | ICD-10-CM

## 2022-02-17 DIAGNOSIS — E559 Vitamin D deficiency, unspecified: Secondary | ICD-10-CM

## 2022-02-17 DIAGNOSIS — M1990 Unspecified osteoarthritis, unspecified site: Secondary | ICD-10-CM

## 2022-02-17 DIAGNOSIS — E785 Hyperlipidemia, unspecified: Secondary | ICD-10-CM | POA: Insufficient documentation

## 2022-02-17 DIAGNOSIS — G4733 Obstructive sleep apnea (adult) (pediatric): Secondary | ICD-10-CM

## 2022-02-17 DIAGNOSIS — Z125 Encounter for screening for malignant neoplasm of prostate: Secondary | ICD-10-CM

## 2022-02-17 DIAGNOSIS — M109 Gout, unspecified: Secondary | ICD-10-CM | POA: Diagnosis not present

## 2022-02-17 DIAGNOSIS — F325 Major depressive disorder, single episode, in full remission: Secondary | ICD-10-CM

## 2022-02-17 MED ORDER — ALLOPURINOL 100 MG PO TABS: 100.0000 mg | ORAL_TABLET | Freq: Every day | ORAL | 6 refills | Status: DC

## 2022-02-17 MED ORDER — SEMAGLUTIDE-WEIGHT MANAGEMENT 1 MG/0.5ML ~~LOC~~ SOAJ: 1.0000 mg | SUBCUTANEOUS | 0 refills | Status: AC

## 2022-02-17 MED ORDER — SEMAGLUTIDE-WEIGHT MANAGEMENT 2.4 MG/0.75ML ~~LOC~~ SOAJ: 2.4000 mg | SUBCUTANEOUS | 0 refills | Status: DC

## 2022-02-17 MED ORDER — PREDNISONE 20 MG PO TABS: ORAL_TABLET | ORAL | 0 refills | Status: DC

## 2022-02-17 MED ORDER — SEMAGLUTIDE-WEIGHT MANAGEMENT 0.5 MG/0.5ML ~~LOC~~ SOAJ: 0.5000 mg | SUBCUTANEOUS | 0 refills | Status: AC

## 2022-02-17 MED ORDER — SEMAGLUTIDE-WEIGHT MANAGEMENT 1.7 MG/0.75ML ~~LOC~~ SOAJ: 1.7000 mg | SUBCUTANEOUS | 0 refills | Status: DC

## 2022-02-17 MED ORDER — SEMAGLUTIDE-WEIGHT MANAGEMENT 0.25 MG/0.5ML ~~LOC~~ SOAJ: 0.2500 mg | SUBCUTANEOUS | 0 refills | Status: AC

## 2022-02-17 NOTE — Assessment & Plan Note (Signed)
I explained how we should use prednisone to get the current attack under control and then start allopurinol We will order lab work to check on the kidneys about 2 to 4 weeks from now after starting allopurinol

## 2022-02-17 NOTE — Assessment & Plan Note (Signed)
Outpatient and Clinic-Administered Medications     amLODipine (NORVASC) 5 MG tablet        metoprolol succinate (TOPROL-XL) 25 MG 24 hr tablet        rosuvastatin (CRESTOR) 20 MG tablet        tadalafil (CIALIS) 10 MG tablet 10 mg, Daily PRN       XARELTO 20 MG TABS tablet       Continue current meds and following with cardiology denies any recent chest pain if he ever gets chest pain it is on the right side and its associated with gas and he has been having a lot of gastritis issues

## 2022-02-17 NOTE — Assessment & Plan Note (Signed)
Controlled, stable Counseled limit salt, limit alcohol, nsaids, weight gain. Goal 140/90 explained - Encouraged home blood pressure monitoring No need for resistant htn w/u yet Continue:  Current hypertension medications:      Sig   amLODipine (NORVASC) 5 MG tablet TAKE 1 TABLET DAILY   metoprolol succinate (TOPROL-XL) 25 MG 24 hr tablet TAKE 1 AND 1/2 TABLETS     DAILY WITH OR IMMEDIATELY  FOLLOWING A MEAL   tadalafil (CIALIS) 10 MG tablet Take 1 tablet (10 mg total) by mouth daily as needed for erectile dysfunction.     Offered to refill meds. cialis mainly for ed Encouraged weight loss.   See AFTER VISIT SUMMARY for addition educational information provided

## 2022-02-17 NOTE — Assessment & Plan Note (Signed)
towards weight loss and encouraged him to continue wearing it

## 2022-02-17 NOTE — Patient Instructions (Addendum)
It was a pleasure seeing you today!  Today the plan is...   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  Please treat any breakthrough backwash or heartburn with Pepcid Complete and try to eat smaller meals and hopefully we can get this weight loss medicine to make that easier for you to not feel so hungry.  Additionally its that appetite that leads to the increased risk of the gout attacks been due to certain proteins in your food.  I am going to give a gout diet for you and I want you to take steroid prednisone for now which can upset your stomach so you need to be sure to have Pepcid Complete and not take it with ibuprofen or naproxen.  Once you are gout starts coming down with the prednisone I want you to add the allopurinol which will reduce the risk of further gout attacks in the future I want you to take it every day forever once you start because stopping can lead to a return of gout attacks.  Blood pressure medications are barely needed at this point and you might be able to if you watch her blood pressure closely stop the amlodipine and if you lose weight it would definitely make it easier to come off these meds similarly the cholesterol will improve with weight loss but in the meantime I encouraged her to eat heart healthy diet with avocados extra virgin olive oil nuts and fish as the superfoods that protect your heart and prevent stroke.  Your atrial fibrillation is directly due to your sleep apnea putting back pressure on your heart and your sleep apnea is directly due to your history of being overweight.  In other words you may be able to come off CPAP and have  better heart function with weight loss so I just wanted to clarify the benefits on that to help you stay motivated as I know it can be very difficult to resist delicious cooking.  Guarding the Zoloft for your mood it turns out that exercise can lift her mood almost as much and so by increasing your exercise you may be able to taper off of it but do not suddenly stop it.  Regarding the vitamin D I just think you can have to stay on it forever because I do not want you drinking milk with your cholesterol      Isaiah Pacas, MD   I would like you to return in 3 months just after having an annual wellness visit so that I can follow-up on any recommendations from the annual wellness visit as well as complete full blood work and see how you are doing with my recommendations that I am made today   - If your condition fails to resolve or you have other questions / concerns: please contact me via phone [phone] or MyChart messaging.  - Please bring all your medicines to your next appointment. This is the best way for me to know exactly what you're taking.  - If your condition begins to worsen or become severe:  go to the ER.   IF you received an x-ray today, you will receive an invoice from  [facility]. Please contact [facility] at [phone] with questions or concerns regarding your invoice.    IF you received labwork today, you will receive an invoice from [facility]. Please contact [facility] at [phone] with questions or concerns regarding your invoice.    Our billing staff will not be able to assist you with questions regarding bills from these companies.   --------------------------------------------------------------------------------------------------------------------  You will be contacted with the lab results as soon as they are available. The fastest way to get your results is to activate your My Chart account. Instructions are located on the last page of this  paperwork. If you have not heard from [place] regarding the results in 2 weeks, please contact this office. For any labs or imaging tests, we will call you if the results are significantly abnormal.  Most normal results will be posted to myChart as soon as they are available and I will comment on them there within 2-3 business days.       Gout  Gout is a condition that causes painful swelling of the joints. Gout is a type of inflammation of the joints (arthritis). This condition is caused by having too much uric acid in the body. Uric acid is a chemical that forms when the body breaks down substances called purines. Purines are important for building body proteins. When the body has too much uric acid, sharp crystals can form and build up inside the joints. This causes pain and swelling. Gout attacks can happen quickly and may be very painful (acute gout). Over time, the attacks can affect more joints and become more frequent (chronic gout). Gout can also cause uric acid to build up under the skin and inside the kidneys. What are the causes? This condition is caused by too much uric acid in your blood. This can happen because: Your kidneys do not remove enough uric acid from your blood. This is the most common cause. Your body makes too much uric acid. This can happen with some cancers and cancer treatments. It can also occur if your body is breaking down too many red blood cells (hemolytic anemia). You eat too many foods that are high in purines. These foods include organ meats and some seafood. Alcohol, especially beer, is also high in purines. A gout attack may be triggered by trauma or stress. What increases the risk? The following factors may make you more likely to develop this condition: Having a family history of gout. Being male and middle-aged. Being male and having gone through menopause. Taking certain medicines, including aspirin, cyclosporine, diuretics, levodopa, and niacin. Having an  organ transplant. Having certain conditions, such as: Being obese. Lead poisoning. Kidney disease. A skin condition called psoriasis. Other factors include: Losing weight too quickly. Being dehydrated. Frequently drinking alcohol, especially beer. Frequently drinking beverages that are sweetened with a type of sugar called fructose. What are the signs or symptoms? An attack of acute gout happens quickly. It usually occurs in just one joint. The most common place is the big toe. Attacks often start at night. Other joints that may be affected include joints of the feet, ankle, knee, fingers, wrist, or elbow. Symptoms of this condition may include: Severe pain. Warmth. Swelling. Stiffness. Tenderness. The affected joint may be very painful to touch. Shiny, red, or purple skin. Chills and fever. Chronic gout may cause symptoms more frequently. More joints may be involved. You may also have white or yellow lumps (tophi) on your hands or feet or in other areas near  your joints. How is this diagnosed? This condition is diagnosed based on your symptoms, your medical history, and a physical exam. You may have tests, such as: Blood tests to measure uric acid levels. Removal of joint fluid with a thin needle (aspiration) to look for uric acid crystals. X-rays to look for joint damage. How is this treated? Treatment for this condition has two phases: treating an acute attack and preventing future attacks. Acute gout treatment may include medicines to reduce pain and swelling, including: NSAIDs, such as ibuprofen. Steroids. These are strong anti-inflammatory medicines that can be taken by mouth (orally) or injected into a joint. Colchicine. This medicine relieves pain and swelling when it is taken soon after an attack. It can be given by mouth or through an IV. Preventive treatment may include: Daily use of smaller doses of NSAIDs or colchicine. Use of a medicine that reduces uric acid levels in  your blood, such as allopurinol. Changes to your diet. You may need to see a dietitian about what to eat and drink to prevent gout. Follow these instructions at home: During a gout attack  If directed, put ice on the affected area. To do this: Put ice in a plastic bag. Place a towel between your skin and the bag. Leave the ice on for 20 minutes, 2-3 times a day. Remove the ice if your skin turns bright red. This is very important. If you cannot feel pain, heat, or cold, you have a greater risk of damage to the area. Raise (elevate) the affected joint above the level of your heart as often as possible. Rest the joint as much as possible. If the affected joint is in your leg, you may be given crutches to use. Follow instructions from your health care provider about eating or drinking restrictions. Avoiding future gout attacks Follow a low-purine diet as told by your dietitian or health care provider. Avoid foods and drinks that are high in purines, including liver, kidney, anchovies, asparagus, herring, mushrooms, mussels, and beer. Maintain a healthy weight or lose weight if you are overweight. If you want to lose weight, talk with your health care provider. Do not lose weight too quickly. Start or maintain an exercise program as told by your health care provider. Eating and drinking Avoid drinking beverages that contain fructose. Drink enough fluids to keep your urine pale yellow. If you drink alcohol: Limit how much you have to: 0-1 drink a day for women who are not pregnant. 0-2 drinks a day for men. Know how much alcohol is in a drink. In the [place] one drink equals one 12 oz bottle of beer (355 mL), one 5 oz glass of wine (148 mL), or one 1 oz glass of hard liquor (44 mL). General instructions Take over-the-counter and prescription medicines only as told by your health care provider. Ask your health care provider if the medicine prescribed to you requires you to avoid driving or using  machinery. Return to your normal activities as told by your health care provider. Ask your health care provider what activities are safe for you. Keep all follow-up visits. This is important. Where to find more information [employer] [facility]: www.niams.[employer].es Contact a health care provider if you have: Another gout attack. Continuing symptoms of a gout attack after 10 days of treatment. Side effects from your medicines. Chills or a fever. Burning pain when you urinate. Pain in your lower back or abdomen. Get help right away if you: Have severe or uncontrolled pain.  Cannot urinate. Summary Gout is painful swelling of the joints caused by having too much uric acid in the body. The most common site for gout to occur is in the big toe, but it can affect other joints in the body. Medicines and dietary changes can help to prevent and treat gout attacks. This information is not intended to replace advice given to you by your health care provider. Make sure you discuss any questions you have with your health care provider. Document Revised: 01/19/2021 Document Reviewed: 01/19/2021 Elsevier Patient Education  [place] for PREVENTING GOUT A low-purine eating plan involves making food choices to limit your purine intake. Purine is a kind of uric acid. Too much uric acid in your blood can cause certain conditions, such as gout and kidney stones. Eating a low-purine diet may help control these conditions. What are tips for following this plan? Shopping Avoid buying products that contain high-fructose corn syrup. Check for this on food labels. It is commonly found in many processed foods and soft drinks. Be sure to check for it in baked goods such as cookies, canned fruits, and cereals and cereal bars. Avoid buying veal, chicken breast with skin, lamb, and organ meats such as liver. These types of meats tend to have the highest purine content. Choose  dairy products. These may lower uric acid levels. Avoid certain types of fish. Not all fish and seafood have high purine content. Examples with high purine content include anchovies, trout, tuna, sardines, and salmon. Avoid buying beverages that contain alcohol, particularly beer and hard liquor. Alcohol can affect the way your body gets rid of uric acid. Meal planning  Learn which foods do or do not affect you. If you find out that a food tends to cause your gout symptoms to flare up, avoid eating that food. You can enjoy foods that do not cause problems. If you have any questions about a food item, talk with your dietitian or health care provider. Reduce the overall amount of meat in your diet. When you do eat meat, choose ones with lower purine content. Include plenty of fruits and vegetables. Although some vegetables may have a high purine content--such as asparagus, mushrooms, spinach, or cauliflower--it has been shown that these do not contribute to uric acid blood levels as much. Consume at least 1 dairy serving a day. This has been shown to decrease uric acid levels. General information If you drink alcohol: Limit how much you have to: 0-1 drink a day for women who are not pregnant. 0-2 drinks a day for men. Know how much alcohol is in a drink. In the [place] one drink equals one 12 oz bottle of beer (355 mL), one 5 oz glass of wine (148 mL), or one 1 oz glass of hard liquor (44 mL). Drink plenty of water. Try to drink enough to keep your urine pale yellow. Fluids can help remove uric acid from your body. Work with your health care provider and dietitian to develop a plan to achieve or maintain a healthy weight. Losing weight may help reduce uric acid in your blood. What foods are recommended? The following are some types of foods that are good choices when limiting purine intake: Fresh or frozen fruits and vegetables. Whole grains, breads, cereals, and pasta. Rice. Beans, peas,  legumes. Nuts and seeds. Dairy products. Fats and oils. The items listed above may not be a complete list. Talk with a dietitian about what dietary choices are  best for you. What foods are not recommended? Limit your intake of foods high in purines, including: Beer and other alcohol. Meat-based gravy or sauce. Canned or fresh fish, such as: Anchovies, sardines, herring, salmon, and tuna. Mussels and scallops. Codfish, trout, and haddock. Bacon, veal, chicken breast with skin, and lamb. Organ meats, such as: Liver or kidney. Tripe. Sweetbreads (thymus gland or pancreas). Wild Clinical biochemist. Yeast or yeast extract supplements. Drinks sweetened with high-fructose corn syrup, such as soda. Processed foods made with high-fructose corn syrup. The items listed above may not be a complete list of foods and beverages you should limit. Contact a dietitian for more information. Summary Eating a low-purine diet may help control conditions caused by too much uric acid in the body, such as gout or kidney stones. Choose low-purine foods, limit alcohol, and limit high-fructose corn syrup. You will learn over time which foods do or do not affect you. If you find out that a food tends to cause your gout symptoms to flare up, avoid eating that food. This information is not intended to replace advice given to you by your health care provider. Make sure you discuss any questions you have with your health care provider. Document Revised: 03/31/2021 Document Reviewed: 03/31/2021 Elsevier Patient Education  [place].

## 2022-02-17 NOTE — Progress Notes (Signed)
[place] at [facility]:  [phone]   Routine Medical Office Visit  Patient:  Isaiah Jones      Age: 71 y.o.       Sex:  male  Date:   02/17/2022  PCP:    Isaiah Pacas, MD    [place] Provider: Loralee Pacas, MD  Assessment/Plan:   FOR PATIENT I DEVELOPED THE FOLLOWING A/P HANDOUT Today in reviewing your medical problem list I developed the impression that most of your medical problems come from your weight and your gastric sleeve treatment for your weight.  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  Please treat any breakthrough backwash or heartburn with Pepcid Complete and try to eat smaller meals and hopefully we can get this weight loss medicine to make that easier for you to not feel so hungry.  Additionally its that appetite that leads to the increased risk of the gout attacks due to certain proteins in your food.  I am going to give a gout diet for you and I want you to take steroid prednisone for now which can upset your stomach so you need to be sure to have Pepcid Complete and not take it with ibuprofen or naproxen.  Once you are gout starts coming down with the prednisone I want you to add the allopurinol which will reduce the risk of further gout attacks in the future I want you to take it every day forever once you start because stopping can lead to a return of gout attacks.  Blood pressure medications are barely needed at this point and you might be able to if you watch her blood pressure closely stop the amlodipine and if you lose weight it would definitely make it easier to come off these meds similarly the cholesterol will improve with weight loss but in the meantime I encouraged her to eat heart healthy diet with avocados extra virgin olive oil nuts and fish as the superfoods that  protect your heart and prevent stroke.  Your atrial fibrillation is directly due to your sleep apnea putting back pressure on your heart and your sleep apnea is directly due to your history of being overweight.  In other words you may be able to come off CPAP and have better heart function with weight loss so I just wanted to clarify the benefits on that to help you stay motivated as I know it can be very difficult to resist delicious cooking.  Guarding the Zoloft for your mood it turns out that exercise can lift her mood almost as much and so by increasing your exercise you may be able to taper off of it but do not suddenly stop it.  Regarding the vitamin D I just think you can have to stay on it forever because I do not want you drinking milk with your cholesterol.  Knee arthritis will also benefit from weight loss.     Isaiah Jones was seen today for transitions of care.  Gout, unspecified cause, unspecified chronicity, unspecified site Overview: Frequent attacks of gout for many years fingers toes 1 currently happening  Assessment & Plan: I explained how we should use prednisone to get the current attack under control and then start allopurinol We will order lab work to check on the kidneys about 2 to 4 weeks from now after starting  allopurinol   Chronic cough Overview: It comes and goes for a very long time even though he quit smoking in 2002 most consistent with chronic bronchitis  Prior work-up includes cxr in 2020 and 2021  CLINICAL DATA:  Preop evaluation for bariatric surgery   EXAM: CHEST - 2 VIEW   COMPARISON:  01/11/2007   FINDINGS: Cardiac shadow is mildly prominent. Mild aortic calcifications are seen. The lungs are clear. No bony abnormality is noted.   IMPRESSION: No acute abnormality noted.   Dysphagia, unspecified type Overview: Feels like food stuck in throat Had barium swallow 2023 Now follow with Isaiah Jones   CLINICAL DATA:  Dysphagia. Food sticking in chest, with  occasional regurgitation. Previous sleeve gastrectomy.   EXAM: ESOPHOGRAM / BARIUM SWALLOW   TECHNIQUE: Combined double contrast and single contrast examination performed using effervescent crystals, thick barium liquid, and thin barium liquid.   FLUOROSCOPY: Radiation Exposure Index (as provided by the fluoroscopic device): 55.3 mGy Kerma   COMPARISON:  Upper GI series on 10/03/2018   FINDINGS: No evidence of vestibular penetration or aspiration during swallowing. Pharynx and cervical esophagus are unremarkable.   Moderate dilatation of the thoracic esophagus is seen, mildly increased since previous study. Severe, smoothly tapered narrowing is seen involving the distal thoracic esophagus and GE junction, with a "bird beak" appearance. This significantly delays passage of liquid barium. This is similar in appearance to prior study. Complete absence of esophageal peristalsis noted.   No findings of esophagitis seen. No evidence of hiatal hernia or gastroesophgeal reflux. Prior sleeve gastrectomy noted.   IMPRESSION: Severe, smoothly tapered narrowing of the distal thoracic esophagus and GE junction, with a "bird beak" appearance, similar to prior exam. Moderate esophageal dilatation shows mild increase since previous study, and there is complete lack of esophageal peristalsis. Differential diagnosis includes achalasia and benign stricture, with esophageal carcinoma considered very unlikely. Consider upper endoscopy and manometry for further evaluation.   No evidence of hiatal hernia or gastroesophageal reflux.    01/05/22 egd and biopsy  - The lumen of the mid esophagus and distal esophagus was moderately dilated. Biopsies were taken with a cold forceps for histology. Findings: - Esophagogastric landmarks were identified: the gastroesophageal junction was found at 47 cm from the incisors. - Since there was mild resistance while pushing through the GE junction, a TTS  dilator was passed through the scope. Dilation with an 18-19-20 mm balloon dilator was performed to 20 mm at the gastroesophageal junction. No mucosal disruption was seen. - Localized inflammation characterized by congestion (edema), erosions and erythema was found in the gastric antrum. Biopsies were taken with a cold forceps for histology. - Localized inflammation characterized by congestion (edema) and erythema was found in the duodenal bulb. Biopsies were taken with a cold forceps for histology.   He feels dilation helped but still gets small slices of lettuce stuck   Dyslipidemia Overview: Lipid Panel     Component Value Date/Time   CHOL 169 06/14/2021 0938   CHOL 133 02/18/2019 0836   TRIG 188.0 (H) 06/14/2021 0938   HDL 53.80 06/14/2021 0938   HDL 40 02/18/2019 0836   CHOLHDL 3 06/14/2021 0938   VLDL 37.6 06/14/2021 0938   LDLCALC 78 06/14/2021 0938   LDLCALC 63 02/18/2019 0836   LDLDIRECT 76.0 06/11/2020 0938   LABVLDL 30 02/18/2019 0836     Depression, major, in remission (Isaiah Jones) with anxiety Overview:    02/17/2022    7:59 AM 06/14/2021    9:06 AM 12/20/2020  9:23 AM  Depression screen PHQ 2/9  Decreased Interest 0 0 0  Down, Depressed, Hopeless 0 0 0  PHQ - 2 Score 0 0 0  Altered sleeping 0  1  Tired, decreased energy 0  0  Change in appetite 0  1  Feeling bad or failure about yourself  0  0  Trouble concentrating 0  0  Moving slowly or fidgety/restless 0  0  Suicidal thoughts 0  0  PHQ-9 Score 0  2  Difficult doing work/chores Not difficult at all  Not difficult at all  he takes zoloft- feels like he doesn't need.    Morbid obesity (Isaiah Jones) Overview: 89 prior to bariatric surgery march 2021   Essential hypertension Assessment & Plan: Controlled, stable Counseled limit salt, limit alcohol, nsaids, weight gain. Goal 140/90 explained - Encouraged home blood pressure monitoring No need for resistant htn w/u yet Continue:  Current hypertension  medications:       Sig   amLODipine (NORVASC) 5 MG tablet TAKE 1 TABLET DAILY   metoprolol succinate (TOPROL-XL) 25 MG 24 hr tablet TAKE 1 AND 1/2 TABLETS     DAILY WITH OR IMMEDIATELY  FOLLOWING A MEAL   tadalafil (CIALIS) 10 MG tablet Take 1 tablet (10 mg total) by mouth daily as needed for erectile dysfunction.      Offered to refill meds. cialis mainly for ed Encouraged weight loss.   See AFTER VISIT SUMMARY for addition educational information provided   OSA on CPAP Overview: He is consistent with his CPAP machine long-term  Assessment & Plan:  towards weight loss and encouraged him to continue wearing it   Osteoarthritis, unspecified osteoarthritis type, unspecified site Overview: He has mixed osteoarthritis and gouty arthritis they osteoarthritis is mainly in the knees I offered her injections and physical therapy but he would just use bike for now   Persistent atrial fibrillation (HCC) Overview: Echo 3/18: Mild LVH, EF 55-60, normal wall motion, grade 1 diastolic dysfunction, mild LAE  Assessment & Plan: Outpatient and Clinic-Administered Medications     amLODipine (NORVASC) 5 MG tablet        metoprolol succinate (TOPROL-XL) 25 MG 24 hr tablet        rosuvastatin (CRESTOR) 20 MG tablet        tadalafil (CIALIS) 10 MG tablet 10 mg, Daily PRN       XARELTO 20 MG TABS tablet       Continue current meds and following with cardiology denies any recent chest pain if he ever gets chest pain it is on the right side and its associated with gas and he has been having a lot of gastritis issues   Vitamin D deficiency Overview: Is been taking vitamin D since February 2023 shows vitamin D on the low range I encouraged him to stay on it for the rest of his life    Return in about 3 months (around 05/20/2022) for subsequent annual wellness visit with nurse.   Today's key discussion points - also in After Visit Summary (AVS) Common side effects, risks, benefits, and  alternatives for medications and treatment plan prescribed today were discussed, and he expressed understanding of the given instructions.  Medication list was reconciled and patient instructions and summary information was documented and made available for him to review in the AVS (see AVS).  This note is also available to patient for review for accuracy and understanding. He was encouraged to contact our office by phone or message via MyChart if  he has any questions or concerns regarding our treatment plan (see AVS).  No barriers to understanding were identified We discussed red flag symptoms and signs in detail and when to call the office or go to ER if his condition worsens (see AFTER VISIT SUMMARY).. He expressed understanding.    Subjective:   GAREK SCHUNEMAN is a 71 y.o. 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    CHF (congestive heart failure) (HCC)    Colon polyps    Complication of anesthesia    Depression    Dyslipidemia    Dysrhythmia 2017   A fib   GERD (gastroesophageal reflux disease)    Glaucoma    early stage   History of 2019 novel coronavirus disease (COVID-19) 06/2019   History of kidney stones    Hypertension    OSA on CPAP    C-Pap   Persistent atrial fibrillation ([place])    Echo 3/18: Mild LVH, EF 55-60, normal wall motion, grade 1 diastolic dysfunction, mild LAE   Pneumonia    childhood   PONV (postoperative nausea and vomiting)    after 1st knee surgery only   Rhinitis, allergic      He is presenting today with: Chief Complaint  Patient presents with   Transitions Of Care     Additional physician collected history: See Assessment/Plan section for per problem updates to history (overview and a/p subsections) as reported by patient today. He did today was go over his entire problem list  and med list and reconciled both in details to each so that I could better understand his medical history and provide better care in the future I created a  Of recommendations at the top of this note which was also put into the wrap-up along with gout diet and gout information for him to review       Objective:  Physical Exam: BP 119/71 (BP Location: Left Arm, Patient Position: Sitting)   Pulse (!) 54   Temp (!) 97.5 F (36.4 C) (Temporal)   Ht '6\' 2"'$  (1.88 m)   Wt (!) 308 lb 12.8 oz (140.1 kg)   SpO2 94%   BMI 39.65 kg/m   He is a polite, friendly, and genuine person overweight Constitutional: NAD, AAO, not ill-appearing  Neuro: alert, no focal deficit obvious, articulate speech Psych: normal mood, behavior, thought content   Problem specific physical exam findings:  Slightly limp

## 2022-03-13 ENCOUNTER — Other Ambulatory Visit (INDEPENDENT_AMBULATORY_CARE_PROVIDER_SITE_OTHER): Payer: Medicare Other

## 2022-03-13 DIAGNOSIS — E559 Vitamin D deficiency, unspecified: Secondary | ICD-10-CM

## 2022-03-13 DIAGNOSIS — Z125 Encounter for screening for malignant neoplasm of prostate: Secondary | ICD-10-CM | POA: Diagnosis not present

## 2022-03-13 DIAGNOSIS — F325 Major depressive disorder, single episode, in full remission: Secondary | ICD-10-CM

## 2022-03-13 LAB — LIPID PANEL
Cholesterol: 148 mg/dL (ref 0–200)
HDL: 48.7 mg/dL (ref >39.00)
LDL Cholesterol: 68 mg/dL (ref 0–99)
NonHDL: 98.8
Total CHOL/HDL Ratio: 3
Triglycerides: 154 mg/dL — ABNORMAL HIGH (ref 0.0–149.0)
VLDL: 30.8 mg/dL (ref 0.0–40.0)

## 2022-03-13 LAB — COMPREHENSIVE METABOLIC PANEL
ALT: 21 U/L (ref 0–53)
AST: 26 U/L (ref 0–37)
Albumin: 4.4 g/dL (ref 3.5–5.2)
Alkaline Phosphatase: 58 U/L (ref 39–117)
BUN: 15 mg/dL (ref 6–23)
CO2: 32 mEq/L (ref 19–32)
Calcium: 10 mg/dL (ref 8.4–10.5)
Chloride: 101 mEq/L (ref 96–112)
Creatinine, Ser: 0.92 mg/dL (ref 0.40–1.50)
GFR: 83.1 mL/min (ref >60.00)
Glucose, Bld: 93 mg/dL (ref 70–99)
Potassium: 4.5 mEq/L (ref 3.5–5.1)
Sodium: 139 mEq/L (ref 135–145)
Total Bilirubin: 0.7 mg/dL (ref 0.2–1.2)
Total Protein: 6.8 g/dL (ref 6.0–8.3)

## 2022-03-13 LAB — CBC
HCT: 49.6 % (ref 39.0–52.0)
Hemoglobin: 16.7 g/dL (ref 13.0–17.0)
MCHC: 33.7 g/dL (ref 30.0–36.0)
MCV: 96.7 fl (ref 78.0–100.0)
Platelets: 174 10*3/uL (ref 150.0–400.0)
RBC: 5.13 Mil/uL (ref 4.22–5.81)
RDW: 13.9 % (ref 11.5–15.5)
WBC: 5.3 10*3/uL (ref 4.0–10.5)

## 2022-03-13 LAB — TSH: TSH: 1.36 u[IU]/mL (ref 0.35–5.50)

## 2022-03-13 LAB — PSA: PSA: 0.13 ng/mL (ref 0.10–4.00)

## 2022-03-13 LAB — VITAMIN D 25 HYDROXY (VIT D DEFICIENCY, FRACTURES): VITD: 37.4 ng/mL (ref 30.00–100.00)

## 2022-03-14 ENCOUNTER — Encounter: Payer: Self-pay | Admitting: Internal Medicine

## 2022-03-14 DIAGNOSIS — E781 Pure hyperglyceridemia: Secondary | ICD-10-CM

## 2022-03-14 HISTORY — DX: Pure hyperglyceridemia: E78.1

## 2022-03-15 LAB — METHYLMALONIC ACID, SERUM: Methylmalonic Acid, Quant: 168 nmol/L (ref 87–318)

## 2022-03-21 NOTE — Progress Notes (Signed)
Cardiology Office Note:    Date:  03/27/2022   ID:  [name], DOB Sep 06, 1950, MRN [id number]  PCP:  Loralee Pacas, MD   [facility] Providers Cardiologist:  Larae Grooms, MD     Referring MD: Loralee Pacas, MD   Chief Complaint: follow-up atrial fibrillation  History of Present Illness:    Isaiah Jones is a very pleasant 71 y.o. male with a hx of HTN, obesity, HLD, OSA on CPAP, and persistent atrial fibrillation.  Referred to cardiology by PCP for new onset atrial fibrillation and seen by Richardson Dopp, PA on 07/11/2016.  Was noted to have an irregular heartbeat at his pulmonology follow-up.  EKG at PCP office confirmed atrial fibrillation. Had symptoms of fatigue and shortness of breath, also occasional palpitations. He was started on metoprolol and Xarelto and advised to work on weight loss.  LV function at that time was 55-60%, mild LVH, G1DD. Unsuccessful cardioversion 07/2016, underwent weight loss surgery 06/2019.  He has maintained consistent follow-up.  Last cardiology clinic visit was 07/25/2021 with Dr. Irish Lack. He continued to work on weight loss. Rate control strategy for a fib. No specific cardiac concerns. Recommendation to follow-up in 9 months.  Today, he is here with his wife for follow-up. Reports he is dealing with swallowing issues but no cardiac concerns. Has shortness of breath with exertion that he feels is stable and improves when he exercises consistently. Walks or rides his bicycle for exercise but admits this is not consistent. Sleeping well, no concerns with CPAP. Occasional bilateral LE edema that resolves overnight. Feels his HR speed up when drinking coffee, otherwise no palpitations, or concerns for tachycardia. He denies chest pain, lower extremity edema, fatigue, melena, hematuria, hemoptysis, diaphoresis, weakness, presyncope, syncope, orthopnea, and PND.  Past Medical History:  Diagnosis Date   Anxiety    Arthritis, degenerative     hip,shoulders, hands, back   Benign hypertensive heart disease without congestive heart failure    CHF (congestive heart failure) (HCC)    Colon polyps    Complication of anesthesia    Depression    Dyslipidemia    Dysrhythmia 2017   A fib   GERD (gastroesophageal reflux disease)    Glaucoma    early stage   History of 2019 novel coronavirus disease (COVID-19) 06/2019   History of kidney stones    Hypertension    Hypertriglyceridemia 03/14/2022   Lab Results Component Value Date/Time  TRIG 154.0 (H) 03/13/2022 08:53 AM  TRIG 188.0 (H) 06/14/2021 09:38 AM  TRIG 203.0 (H) 06/11/2020 09:38 AM  TRIG 166.0 (H) 06/11/2019 09:44 AM  TRIG 177 (H) 02/18/2019 08:36 AM     OSA on CPAP    C-Pap   Persistent atrial fibrillation ([place])    Echo 3/18: Mild LVH, EF 55-60, normal wall motion, grade 1 diastolic dysfunction, mild LAE   Pneumonia    childhood   PONV (postoperative nausea and vomiting)    after 1st knee surgery only   Rhinitis, allergic     Past Surgical History:  Procedure Laterality Date   arthroscopic knee surgery Bilateral 2003   CARDIOVERSION N/A 08/10/2016   Procedure: CARDIOVERSION;  Surgeon: Pixie Casino, MD;  Location: [place];  Service: Cardiovascular;  Laterality: N/A;   COLONOSCOPY     CYSTOSCOPY WITH RETROGRADE PYELOGRAM, URETEROSCOPY AND STENT PLACEMENT Left 03/04/2021   Procedure: CYSTOSCOPY WITH BILATERAL RETROGRADES,PYELOGRAM, URETEROSCOPY AND STENT PLACEMENT;  Surgeon: Alexis Frock, MD;  Location: [facility];  Service: Urology;  Laterality: Left;  1 HR   ESOPHAGEAL MANOMETRY N/A 03/22/2022   Procedure: ESOPHAGEAL MANOMETRY (EM);  Surgeon: Sharyn Creamer, MD;  Location: [facility];  Service: Gastroenterology;  Laterality: N/A;   HOLMIUM LASER APPLICATION Left 93/08/7015   Procedure: HOLMIUM LASER APPLICATION;  Surgeon: Alexis Frock, MD;  Location: [facility];  Service: Urology;  Laterality: Left;   LAPAROSCOPIC GASTRIC SLEEVE RESECTION N/A 07/15/2019    Procedure: LAPAROSCOPIC GASTRIC SLEEVE RESECTION, Upper Endo, ERAS Pathway;  Surgeon: [name], MD;  Location: [facility];  Service: General;  Laterality: N/A;   REPLACEMENT TOTAL KNEE Bilateral 2008   TONSILLECTOMY     TOTAL HIP ARTHROPLASTY Right 10/21/2019   Procedure: RIGHT TOTAL HIP ARTHROPLASTY ANTERIOR APPROACH;  Surgeon: Melrose Nakayama, MD;  Location: [facility];  Service: Orthopedics;  Laterality: Right;   VASECTOMY      Current Medications: Current Meds  Medication Sig   acetaminophen (TYLENOL) 500 MG tablet Take 500 mg by mouth every 6 (six) hours as needed (for pain.).    amLODipine (NORVASC) 5 MG tablet TAKE 1 TABLET DAILY   azelastine (ASTELIN) 0.1 % nasal spray Place 2 sprays into both nostrils 2 (two) times daily.   azelastine (ASTELIN) 0.1 % nasal spray Place 1 spray into the nose daily as needed.   Calcium Carb-Cholecalciferol (CALCIUM-VITAMIN D) 600-400 MG-UNIT TABS Take 1 tablet by mouth in the morning, at noon, and at bedtime.   Calcium Carbonate-Vit D-Min (CALCIUM 600+D PLUS MINERALS) 600-400 MG-UNIT TABS Take 1 each by mouth daily as needed.   diphenhydramine-acetaminophen (TYLENOL PM) 25-500 MG TABS tablet Take 2 tablets by mouth at bedtime.    fexofenadine (ALLEGRA) 180 MG tablet Take 180 mg by mouth daily as needed for allergies or rhinitis.   Ginkgo Biloba 60 MG TABS Take 60 mg by mouth daily.   latanoprost (XALATAN) 0.005 % ophthalmic solution Place 1 drop into both eyes at bedtime.    methocarbamol (ROBAXIN) 750 MG tablet Take 1 tablet (750 mg total) by mouth every 6 (six) hours as needed for muscle spasms.   mometasone (NASONEX) 50 MCG/ACT nasal spray Place 1 spray into the nose at bedtime.   Multiple Vitamin (MULTIVITAMIN WITH MINERALS) TABS tablet Take 2 tablets by mouth daily. Bariatric Multivitamin   Multiple Vitamins-Minerals (AIRBORNE PO) Take 1 tablet by mouth daily as needed (immune health).    PENNSAID 2 % SOLN Apply 1 Pump topically 4 (four) times daily  as needed (aching joints).    rosuvastatin (CRESTOR) 20 MG tablet TAKE 1 TABLET DAILY   Semaglutide-Weight Management 0.5 MG/0.5ML SOAJ Inject 0.5 mg into the skin once a week for 28 days.   sertraline (ZOLOFT) 50 MG tablet TAKE 1 TABLET DAILY   tadalafil (CIALIS) 10 MG tablet Take 1 tablet (10 mg total) by mouth daily as needed for erectile dysfunction.   Thiamine HCl (VITAMIN B1 PO) Take by mouth.   [DISCONTINUED] metoprolol succinate (TOPROL-XL) 25 MG 24 hr tablet TAKE 1 AND 1/2 TABLETS     DAILY WITH OR IMMEDIATELY  FOLLOWING A MEAL   [DISCONTINUED] XARELTO 20 MG TABS tablet TAKE 1 TABLET DAILY WITH   SUPPER     Allergies:   Patient has no known allergies.   Social History   Socioeconomic History   Marital status: Married    Spouse name: Not on file   Number of children: Not on file   Years of education: Not on file   Highest education level: Not on file  Occupational History  Occupation: Retired   Tobacco Use   Smoking status: Former    Types: Cigarettes    Quit date: 07/04/2001    Years since quitting: 20.7   Smokeless tobacco: Never  Vaping Use   Vaping Use: Never used  Substance and Sexual Activity   Alcohol use: Yes    Comment: rare   Drug use: Never   Sexual activity: Yes    Comment: MARRIED  Other Topics Concern   Not on file  Social History Narrative   Lives in [place], [place]   Married   2 kids   Retired - Prior worked at [employer] Strain: Not on [employer] Insecurity: Not on Pensions consultant Needs: Not on file  Physical Activity: Not on file  Stress: Not on file  Social Connections: Not on file     Family History: The patient's family history includes Alzheimer's disease in his mother; Arthritis in his father and mother; Esophageal cancer in his father; Heart attack in his mother; Heart attack (age of onset: 3) in his father; Hyperlipidemia in his brother, father, and mother; Hypertension in his  brother, father, and mother; Other in his brother. There is no history of Atrial fibrillation.  ROS:   Please see the history of present illness.    + chronic DOE All other systems reviewed and are negative.  Labs/Other Studies Reviewed:    The following studies were reviewed today:  Cardiac monitor 01/28/21   Atrial fibrillation with average HR in the high 50s to low 70s range. Pauses noted during sleeping hours.   Echo 07/11/16  Left ventricle: The cavity size was normal. Wall thickness was    increased in a pattern of mild LVH. Systolic function was normal.    The estimated ejection fraction was in the range of 55% to 60%.    Wall motion was normal; there were no regional wall motion    abnormalities. Doppler parameters are consistent with abnormal    left ventricular relaxation (grade 1 diastolic dysfunction).  - Left atrium: The atrium was mildly dilated. Volume/bsa, S: 37.4    ml/m^2.   Recent Labs: 03/13/2022: ALT 21; BUN 15; Creatinine, Ser 0.92; Hemoglobin 16.7; Platelets 174.0; Potassium 4.5; Sodium 139; TSH 1.36  Recent Lipid Panel    Component Value Date/Time   CHOL 148 03/13/2022 0853   CHOL 133 02/18/2019 0836   TRIG 154.0 (H) 03/13/2022 0853   HDL 48.70 03/13/2022 0853   HDL 40 02/18/2019 0836   CHOLHDL 3 03/13/2022 0853   VLDL 30.8 03/13/2022 0853   LDLCALC 68 03/13/2022 0853   LDLCALC 63 02/18/2019 0836   LDLDIRECT 76.0 06/11/2020 0938     Risk Assessment/Calculations:    CHA2DS2-VASc Score = 3  {This indicates a 3.2% annual risk of stroke. The patient's score is based upon: CHF History: 1 HTN History: 1 Diabetes History: 0 Stroke History: 0 Vascular Disease History: 0 Age Score: 1 Gender Score: 0    Physical Exam:    VS:  BP 122/76   Pulse 61   Ht '6\' 2"'$  (1.88 m)   Wt (!) 311 lb 3.2 oz (141.2 kg)   SpO2 95%   BMI 39.96 kg/m     Wt Readings from Last 3 Encounters:  03/27/22 (!) 311 lb 3.2 oz (141.2 kg)  02/17/22 (!) 308 lb 12.8 oz  (140.1 kg)  01/05/22 300 lb (136.1 kg)     GEN:  Obese male in  no acute distress HEENT: Normal NECK: No JVD; No carotid bruits CARDIAC: Irregular RR, no murmurs, rubs, gallops RESPIRATORY:  Clear to auscultation without rales, wheezing or rhonchi  ABDOMEN: Soft, non-tender, non-distended MUSCULOSKELETAL:  No edema; No deformity. 2+ pedal pulses, equal bilaterally SKIN: Warm and dry NEUROLOGIC:  Alert and oriented x 3 PSYCHIATRIC:  Normal affect   EKG:  EKG is ordered today.  The ekg ordered today demonstrates atrial fibrillation with rate well-controlled at 61 bpm, LAD, LBBB   Diagnoses:    1. LBBB (left bundle branch block)   2. Persistent atrial fibrillation ([place])   3. OSA on CPAP   4. Hypertensive heart disease without heart failure   5. Morbid obesity ([place])   6. Mixed hyperlipidemia   7. Chronic anticoagulation    Assessment and Plan:     Persistent atrial fibrillation on chronic anticoagulation: Atrial fibrillation at well-controlled ventricular rate on EKG today.  He denies frequent palpitations or concerns for tachycardia. Has DOE which he feels is stable and improves when he is more active, no chest pain. Overall, not very symptomatic. Continue rate control strategy.  No bleeding concerns.  Xarelto 20 mg daily is appropriate dose for Scr.  Continue Toprol XL, Xarelto.  Left bundle branch block: Newly identified LBBB on EKG today.  HR is stable at 61 bpm. He denies lightheadedness, presyncope, syncope. We will get an echocardiogram to evaluate LV function.   Hypertension: BP is well-controlled.  No changes today.  OSA: Compliant with CPAP, no concerns.   Morbid obesity: Encouraged 150 minutes of moderate intensity exercise each week in addition to healthy diet and weight loss.   Hyperlipidemia: LDL 68 on 03/13/22. Continue rosuvastatin.      Disposition: 6 months with Dr. Irish Lack  Medication Adjustments/Labs and Tests Ordered: Current medicines are reviewed at  length with the patient today.  Concerns regarding medicines are outlined above.  Orders Placed This Encounter  Procedures   EKG 12-Lead   ECHOCARDIOGRAM COMPLETE   Meds ordered this encounter  Medications   metoprolol succinate (TOPROL-XL) 25 MG 24 hr tablet    Sig: TAKE 1 AND 1/2 TABLETS     DAILY WITH OR IMMEDIATELY  FOLLOWING A MEAL    Dispense:  135 tablet    Refill:  3   rivaroxaban (XARELTO) 20 MG TABS tablet    Sig: TAKE 1 TABLET DAILY WITH   SUPPER    Dispense:  90 tablet    Refill:  3    Patient Instructions  Medication Instructions:  Your physician recommends that you continue on your current medications as directed. Please refer to the Current Medication list given to you today.  *If you need a refill on your cardiac medications before your next appointment, please call your pharmacy*   Lab Work: None ordered  If you have labs (blood work) drawn today and your tests are completely normal, you will receive your results only by: [place] (if you have MyChart) OR A paper copy in the mail If you have any lab test that is abnormal or we need to change your treatment, we will call you to review the results.   Testing/Procedures: Your physician has requested that you have an echocardiogram. Echocardiography is a painless test that uses sound waves to create images of your heart. It provides your doctor with information about the size and shape of your heart and how well your heart's chambers and valves are working. This procedure takes approximately one hour. There are no  restrictions for this procedure. Please do NOT wear cologne, perfume, aftershave, or lotions (deodorant is allowed). Please arrive 15 minutes prior to your appointment time.    Follow-Up: At [facility], you and your health needs are our priority.  As part of our continuing mission to provide you with exceptional heart care, we have created designated Provider Care Teams.  These Care  Teams include your primary Cardiologist (physician) and Advanced Practice Providers (APPs -  Physician Assistants and Nurse Practitioners) who all work together to provide you with the care you need, when you need it.  We recommend signing up for the patient portal called "MyChart".  Sign up information is provided on this After Visit Summary.  MyChart is used to connect with patients for Virtual Visits (Telemedicine).  Patients are able to view lab/test results, encounter notes, upcoming appointments, etc.  Non-urgent messages can be sent to your provider as well.   To learn more about what you can do with MyChart, go to [email or web address].    Your next appointment:   6 month(s)  The format for your next appointment:   In Person  Provider:   Larae Grooms, MD     Other Instructions   Important Information About Sugar         Signed, Emmaline Life, NP  03/27/2022 8:34 AM    [place]

## 2022-03-22 ENCOUNTER — Ambulatory Visit (HOSPITAL_COMMUNITY)
Admission: RE | Admit: 2022-03-22 | Discharge: 2022-03-22 | Disposition: A | Discharge status: Home / Self Care | Payer: Medicare Other | Source: Ambulatory Visit | Attending: Internal Medicine | Admitting: Internal Medicine

## 2022-03-22 ENCOUNTER — Encounter (HOSPITAL_COMMUNITY)
Admission: RE | Disposition: A | Discharge status: Home / Self Care | Payer: Self-pay | Source: Ambulatory Visit | Attending: Internal Medicine

## 2022-03-22 DIAGNOSIS — R1310 Dysphagia, unspecified: Secondary | ICD-10-CM

## 2022-03-22 HISTORY — PX: ESOPHAGEAL MANOMETRY: SHX5429

## 2022-03-22 SURGERY — MANOMETRY, ESOPHAGUS

## 2022-03-22 MED ORDER — LIDOCAINE VISCOUS HCL 2 % MT SOLN
OROMUCOSAL | Status: AC
  Filled 2022-03-22: qty 15

## 2022-03-22 SURGICAL SUPPLY — 2 items
FACESHIELD LNG OPTICON STERILE (SAFETY) IMPLANT
GLOVE BIO SURGEON STRL SZ8 (GLOVE) ×2 IMPLANT

## 2022-03-22 NOTE — Progress Notes (Signed)
Esophageal Manometry done per protocol. Patient tolerated well without distress or complication.  

## 2022-03-24 ENCOUNTER — Encounter (HOSPITAL_COMMUNITY): Payer: Self-pay | Admitting: Internal Medicine

## 2022-03-27 ENCOUNTER — Encounter: Payer: Self-pay | Admitting: Nurse Practitioner

## 2022-03-27 ENCOUNTER — Ambulatory Visit: Payer: Medicare Other | Attending: Nurse Practitioner | Admitting: Nurse Practitioner

## 2022-03-27 VITALS — BP 122/76 | HR 61 | Ht 74.0 in | Wt 311.2 lb

## 2022-03-27 DIAGNOSIS — I447 Left bundle-branch block, unspecified: Secondary | ICD-10-CM

## 2022-03-27 DIAGNOSIS — Z7901 Long term (current) use of anticoagulants: Secondary | ICD-10-CM

## 2022-03-27 DIAGNOSIS — I119 Hypertensive heart disease without heart failure: Secondary | ICD-10-CM | POA: Diagnosis not present

## 2022-03-27 DIAGNOSIS — I4819 Other persistent atrial fibrillation: Secondary | ICD-10-CM | POA: Diagnosis not present

## 2022-03-27 DIAGNOSIS — E782 Mixed hyperlipidemia: Secondary | ICD-10-CM

## 2022-03-27 DIAGNOSIS — G4733 Obstructive sleep apnea (adult) (pediatric): Secondary | ICD-10-CM | POA: Diagnosis not present

## 2022-03-27 MED ORDER — RIVAROXABAN 20 MG PO TABS: ORAL_TABLET | ORAL | 3 refills | Status: DC

## 2022-03-27 MED ORDER — METOPROLOL SUCCINATE ER 25 MG PO TB24: ORAL_TABLET | ORAL | 3 refills | Status: DC

## 2022-03-27 NOTE — Patient Instructions (Signed)
Medication Instructions:  Your physician recommends that you continue on your current medications as directed. Please refer to the Current Medication list given to you today.  *If you need a refill on your cardiac medications before your next appointment, please call your pharmacy*   Lab Work: None ordered  If you have labs (blood work) drawn today and your tests are completely normal, you will receive your results only by: [place] (if you have MyChart) OR A paper copy in the mail If you have any lab test that is abnormal or we need to change your treatment, we will call you to review the results.   Testing/Procedures: Your physician has requested that you have an echocardiogram. Echocardiography is a painless test that uses sound waves to create images of your heart. It provides your doctor with information about the size and shape of your heart and how well your heart's chambers and valves are working. This procedure takes approximately one hour. There are no restrictions for this procedure. Please do NOT wear cologne, perfume, aftershave, or lotions (deodorant is allowed). Please arrive 15 minutes prior to your appointment time.    Follow-Up: At [facility], you and your health needs are our priority.  As part of our continuing mission to provide you with exceptional heart care, we have created designated Provider Care Teams.  These Care Teams include your primary Cardiologist (physician) and Advanced Practice Providers (APPs -  Physician Assistants and Nurse Practitioners) who all work together to provide you with the care you need, when you need it.  We recommend signing up for the patient portal called "MyChart".  Sign up information is provided on this After Visit Summary.  MyChart is used to connect with patients for Virtual Visits (Telemedicine).  Patients are able to view lab/test results, encounter notes, upcoming appointments, etc.  Non-urgent messages can be  sent to your provider as well.   To learn more about what you can do with MyChart, go to [email or web address].    Your next appointment:   6 month(s)  The format for your next appointment:   In Person  Provider:   Larae Grooms, MD     Other Instructions   Important Information About Sugar

## 2022-03-29 ENCOUNTER — Encounter: Payer: Self-pay | Admitting: Internal Medicine

## 2022-03-29 DIAGNOSIS — Z7901 Long term (current) use of anticoagulants: Secondary | ICD-10-CM | POA: Insufficient documentation

## 2022-03-29 DIAGNOSIS — I447 Left bundle-branch block, unspecified: Secondary | ICD-10-CM | POA: Insufficient documentation

## 2022-03-29 DIAGNOSIS — I110 Hypertensive heart disease with heart failure: Secondary | ICD-10-CM | POA: Insufficient documentation

## 2022-03-30 ENCOUNTER — Encounter: Payer: Self-pay | Admitting: Internal Medicine

## 2022-03-30 DIAGNOSIS — K220 Achalasia of cardia: Secondary | ICD-10-CM | POA: Insufficient documentation

## 2022-03-30 NOTE — Progress Notes (Signed)
Notified the patient about the diagnosis of type 2 achalasia based upon his recent esophageal manometry. Went over the options for treatment and patient would like to be considered for definitive therapy. Will refer to [facility] for consideration of surgical myotomy or POEM.  Beth, please place referral to [facility] surgery to Dr. Maryjane Hurter or Dr. Erie Noe.

## 2022-04-05 ENCOUNTER — Telehealth: Payer: Self-pay

## 2022-04-05 NOTE — Telephone Encounter (Signed)
Referral to Dr Erie Noe of [facility]. Records faxed. Insurance information and demographics. DG esophagram power shared.

## 2022-04-12 NOTE — Telephone Encounter (Signed)
Patient states that he has not heard from Dr. Erie Noe office as of yet. He says that he has tried to call them to set up an appointment but cannot get through to the office. Best # to reach patient is [phone]

## 2022-04-17 ENCOUNTER — Ambulatory Visit (HOSPITAL_COMMUNITY): Payer: Medicare Other | Attending: Internal Medicine

## 2022-04-17 DIAGNOSIS — I447 Left bundle-branch block, unspecified: Secondary | ICD-10-CM | POA: Insufficient documentation

## 2022-04-17 DIAGNOSIS — I4819 Other persistent atrial fibrillation: Secondary | ICD-10-CM | POA: Insufficient documentation

## 2022-04-17 LAB — ECHOCARDIOGRAM COMPLETE: S' Lateral: 4.1 cm

## 2022-04-17 NOTE — Telephone Encounter (Signed)
Inbound call from patient f/u on [facility] referral Please advise.  Thank you

## 2022-04-17 NOTE — Telephone Encounter (Signed)
Spoke with the patient. He is waiting to hear from the surgeon's office.  Called Dr Erie Noe. They received the power share of the radiology images. The records were not received.  Faxed the records again to the confirmed number.

## 2022-04-18 NOTE — Progress Notes (Unsigned)
Cardiology Office Note:    Date:  04/19/2022   ID:  MARSDEN ZAINO, DOB 11/15/1950, MRN [id number]  PCP:  Loralee Pacas, MD   [facility] Providers Cardiologist:  Larae Grooms, MD     Referring MD: Loralee Pacas, MD   Chief Complaint: follow-up atrial fibrillation  History of Present Illness:    Isaiah Jones is a very pleasant 71 y.o. male with a hx of HTN, obesity, HLD, OSA on CPAP, and persistent atrial fibrillation.  Referred to cardiology by PCP for new onset atrial fibrillation and seen by Richardson Dopp, PA on 07/11/2016. Was noted to have an irregular heartbeat at his pulmonology follow-up.  EKG at PCP office confirmed atrial fibrillation. Had symptoms of fatigue and shortness of breath, also occasional palpitations. He was started on metoprolol and Xarelto and advised to work on weight loss. LV function at that time was 55-60%, mild LVH, G1DD. Unsuccessful cardioversion 07/2016, underwent weight loss surgery 06/2019. He has maintained consistent follow-up.  Last cardiology clinic visit was 07/25/2021 with Dr. Irish Lack. He continued to work on weight loss. Rate control strategy for a fib. No specific cardiac concerns. Recommendation to follow-up in 9 months.  Seen by me on 03/27/22 accompanied by his wife for follow-up. Reports he is dealing with swallowing issues but no cardiac concerns. Has shortness of breath with exertion that he feels is stable and improves when he exercises consistently. Walks or rides his bicycle for exercise but admits this is not consistent. Sleeping well, no concerns with CPAP. Occasional bilateral LE edema that resolves overnight. Feels his HR speed up when drinking coffee, otherwise no palpitations, or concerns for tachycardia. He denied chest pain, lower extremity edema, fatigue, melena, hematuria, hemoptysis, diaphoresis, weakness, presyncope, syncope, orthopnea, and PND.  He had a newly identified LBBB on EKG.  Echocardiogram was ordered  which revealed significant decrease in LVEF to 30 to 35%, global HK of LV, mild LVH, unable to evaluate diastolic function, RV SF moderately reduced, normal RV size. I advised him to return for an office visit to initiate GDMT for acute CHF.  Today, he is here with his wife to discuss recent echo results. Reports an additional recent finding on EGD that showed achalasia and has been referred to [facility]. Reports shortness of breath remains stable. Denies chest pain, lower extremity edema, fatigue, palpitations, melena, hematuria, hemoptysis, diaphoresis, weakness, presyncope, syncope, orthopnea, and PND. Continues to walk and ride stationary bike for exercise without change in activity tolerance. Questions about medical therapy answered to his satisfaction.   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    CHF (congestive heart failure) (HCC)    Colon polyps    Complication of anesthesia    Depression    Dyslipidemia    Dysrhythmia 2017   A fib   GERD (gastroesophageal reflux disease)    Glaucoma    early stage   History of 2019 novel coronavirus disease (COVID-19) 06/2019   History of kidney stones    Hypertension    Hypertriglyceridemia 03/14/2022   Lab Results Component Value Date/Time  TRIG 154.0 (H) 03/13/2022 08:53 AM  TRIG 188.0 (H) 06/14/2021 09:38 AM  TRIG 203.0 (H) 06/11/2020 09:38 AM  TRIG 166.0 (H) 06/11/2019 09:44 AM  TRIG 177 (H) 02/18/2019 08:36 AM     OSA on CPAP    C-Pap  Persistent atrial fibrillation ([place])    Echo 3/18: Mild LVH, EF 55-60, normal wall motion, grade 1 diastolic dysfunction, mild LAE   Pneumonia    childhood   PONV (postoperative nausea and vomiting)    after 1st knee surgery only   Rhinitis, allergic     Past Surgical History:  Procedure Laterality Date   arthroscopic knee surgery Bilateral 2003   CARDIOVERSION  N/A 08/10/2016   Procedure: CARDIOVERSION;  Surgeon: Pixie Casino, MD;  Location: [facility];  Service: Cardiovascular;  Laterality: N/A;   COLONOSCOPY     CYSTOSCOPY WITH RETROGRADE PYELOGRAM, URETEROSCOPY AND STENT PLACEMENT Left 03/04/2021   Procedure: CYSTOSCOPY WITH BILATERAL RETROGRADES,PYELOGRAM, URETEROSCOPY AND STENT PLACEMENT;  Surgeon: Alexis Frock, MD;  Location: [facility];  Service: Urology;  Laterality: Left;  1 HR   ESOPHAGEAL MANOMETRY N/A 03/22/2022   Procedure: ESOPHAGEAL MANOMETRY (EM);  Surgeon: Sharyn Creamer, MD;  Location: [facility];  Service: Gastroenterology;  Laterality: N/A;   HOLMIUM LASER APPLICATION Left 98/12/2117   Procedure: HOLMIUM LASER APPLICATION;  Surgeon: Alexis Frock, MD;  Location: [facility];  Service: Urology;  Laterality: Left;   LAPAROSCOPIC GASTRIC SLEEVE RESECTION N/A 07/15/2019   Procedure: LAPAROSCOPIC GASTRIC SLEEVE RESECTION, Upper Endo, ERAS Pathway;  Surgeon: [name], MD;  Location: [facility];  Service: General;  Laterality: N/A;   REPLACEMENT TOTAL KNEE Bilateral 2008   TONSILLECTOMY     TOTAL HIP ARTHROPLASTY Right 10/21/2019   Procedure: RIGHT TOTAL HIP ARTHROPLASTY ANTERIOR APPROACH;  Surgeon: Melrose Nakayama, MD;  Location: [facility];  Service: Orthopedics;  Laterality: Right;   VASECTOMY      Current Medications: Current Meds  Medication Sig   acetaminophen (TYLENOL) 500 MG tablet Take 500 mg by mouth every 6 (six) hours as needed (for pain.).    allopurinol (ZYLOPRIM) 100 MG tablet Take 1 tablet (100 mg total) by mouth daily. Dont start until steroids haved calmed down the attack, but do not stop if an attack returns.   amoxicillin (AMOXIL) 250 MG capsule Take 500 mg by mouth 3 (three) times daily.   azelastine (ASTELIN) 0.1 % nasal spray Place 1 spray into the nose daily as needed.   Calcium Carb-Cholecalciferol (CALCIUM-VITAMIN D) 600-400 MG-UNIT TABS Take 1 tablet by mouth in the morning, at noon, and at bedtime.   Calcium  Carbonate-Vit D-Min (CALCIUM 600+D PLUS MINERALS) 600-400 MG-UNIT TABS Take 1 each by mouth daily as needed.   diphenhydramine-acetaminophen (TYLENOL PM) 25-500 MG TABS tablet Take 2 tablets by mouth at bedtime.    fexofenadine (ALLEGRA) 180 MG tablet Take 180 mg by mouth daily as needed for allergies or rhinitis.   Ginkgo Biloba 60 MG TABS Take 60 mg by mouth daily.   latanoprost (XALATAN) 0.005 % ophthalmic solution Place 1 drop into both eyes at bedtime.    methocarbamol (ROBAXIN) 750 MG tablet Take 1 tablet (750 mg total) by mouth every 6 (six) hours as needed for muscle spasms.   metoprolol succinate (TOPROL-XL) 25 MG 24 hr tablet TAKE 1 AND 1/2 TABLETS     DAILY WITH OR IMMEDIATELY  FOLLOWING A MEAL   mometasone (NASONEX) 50 MCG/ACT nasal spray Place 1 spray into the nose at bedtime.   Multiple Vitamin (MULTIVITAMIN WITH MINERALS) TABS tablet Take 2 tablets by mouth daily. Bariatric Multivitamin   Multiple Vitamins-Minerals (AIRBORNE PO) Take 1 tablet by mouth daily as needed (immune health).    PENNSAID 2 % SOLN Apply 1 Pump topically 4 (four) times daily as needed (aching  joints).    rivaroxaban (XARELTO) 20 MG TABS tablet TAKE 1 TABLET DAILY WITH   SUPPER   rosuvastatin (CRESTOR) 20 MG tablet TAKE 1 TABLET DAILY   sacubitril-valsartan (ENTRESTO) 49-51 MG Take 1 tablet by mouth 2 (two) times daily.   sertraline (ZOLOFT) 50 MG tablet TAKE 1 TABLET DAILY   tadalafil (CIALIS) 10 MG tablet Take 1 tablet (10 mg total) by mouth daily as needed for erectile dysfunction.   Thiamine HCl (VITAMIN B1 PO) Take by mouth.   [DISCONTINUED] amLODipine (NORVASC) 5 MG tablet TAKE 1 TABLET DAILY     Allergies:   Patient has no known allergies.   Social History   Socioeconomic History   Marital status: Married    Spouse name: Not on file   Number of children: Not on file   Years of education: Not on file   Highest education level: Not on file  Occupational History   Occupation: Retired   Tobacco  Use   Smoking status: Former    Types: Cigarettes    Quit date: 07/04/2001    Years since quitting: 20.8   Smokeless tobacco: Never  Vaping Use   Vaping Use: Never used  Substance and Sexual Activity   Alcohol use: Yes    Comment: rare   Drug use: Never   Sexual activity: Yes    Comment: MARRIED  Other Topics Concern   Not on file  Social History Narrative   Lives in [place], [place]   Married   2 kids   Retired - Prior worked at [employer] Strain: Not on [employer] Insecurity: Not on Pensions consultant Needs: Not on file  Physical Activity: Not on file  Stress: Not on file  Social Connections: Not on file     Family History: The patient's family history includes Alzheimer's disease in his mother; Arthritis in his father and mother; Esophageal cancer in his father; Heart attack in his mother; Heart attack (age of onset: 64) in his father; Hyperlipidemia in his brother, father, and mother; Hypertension in his brother, father, and mother; Other in his brother. There is no history of Atrial fibrillation.  ROS:   Please see the history of present illness.    + chronic DOE All other systems reviewed and are negative.  Labs/Other Studies Reviewed:    The following studies were reviewed today:  Echo 04/17/22  1. Compared to echo report from 2018, LVEF is worse.   2. Difficult acoustic windows, particularly apically. Diffuse  hypokinesis, worse in the anterior and septal walls. COnsider limited echo  with Defiinity to better define LVEF and regional wall motion.   3. Left ventricular ejection fraction, by estimation, is 30 to 35%. The  left ventricle has moderately decreased function. The left ventricle  demonstrates global hypokinesis. There is mild concentric left ventricular  hypertrophy. Left ventricular  diastolic function could not be evaluated.   4. Right ventricular systolic function is moderately reduced. The right   ventricular size is normal.   5. Left atrial size was moderately dilated.   6. Right atrial size was severely dilated.   7. The mitral valve is normal in structure. Mild mitral valve  regurgitation.   8. The aortic valve is tricuspid. Aortic valve regurgitation is not  visualized.   9. The inferior vena cava is normal in size with greater than 50%  respiratory variability, suggesting right atrial pressure of 3 mmHg.  Cardiac monitor 01/28/21  Atrial fibrillation with average HR in the high 50s to low 70s range. Pauses noted during sleeping hours.   Echo 07/11/16  Left ventricle: The cavity size was normal. Wall thickness was    increased in a pattern of mild LVH. Systolic function was normal.    The estimated ejection fraction was in the range of 55% to 60%.    Wall motion was normal; there were no regional wall motion    abnormalities. Doppler parameters are consistent with abnormal    left ventricular relaxation (grade 1 diastolic dysfunction).  - Left atrium: The atrium was mildly dilated. Volume/bsa, S: 37.4    ml/m^2.   Recent Labs: 03/13/2022: ALT 21; BUN 15; Creatinine, Ser 0.92; Hemoglobin 16.7; Platelets 174.0; Potassium 4.5; Sodium 139; TSH 1.36  Recent Lipid Panel    Component Value Date/Time   CHOL 148 03/13/2022 0853   CHOL 133 02/18/2019 0836   TRIG 154.0 (H) 03/13/2022 0853   HDL 48.70 03/13/2022 0853   HDL 40 02/18/2019 0836   CHOLHDL 3 03/13/2022 0853   VLDL 30.8 03/13/2022 0853   LDLCALC 68 03/13/2022 0853   LDLCALC 63 02/18/2019 0836   LDLDIRECT 76.0 06/11/2020 0938     Risk Assessment/Calculations:    CHA2DS2-VASc Score = 3  {This indicates a 3.2% annual risk of stroke. The patient's score is based upon: CHF History: 1 HTN History: 1 Diabetes History: 0 Stroke History: 0 Vascular Disease History: 0 Age Score: 1 Gender Score: 0    Physical Exam:    VS:  BP (!) 144/90   Pulse (!) 56   Ht '6\' 2"'$  (1.88 m)   Wt (!) 317 lb 6.4 oz (144 kg)    SpO2 97%   BMI 40.75 kg/m     Wt Readings from Last 3 Encounters:  04/19/22 (!) 317 lb 6.4 oz (144 kg)  03/27/22 (!) 311 lb 3.2 oz (141.2 kg)  02/17/22 (!) 308 lb 12.8 oz (140.1 kg)     GEN:  Obese male in no acute distress HEENT: Normal NECK: No JVD; No carotid bruits CARDIAC: Irregular RR, no murmurs, rubs, gallops RESPIRATORY:  Clear to auscultation without rales, wheezing or rhonchi  ABDOMEN: Soft, non-tender, protuberant MUSCULOSKELETAL:  No edema; No deformity. 2+ pedal pulses, equal bilaterally SKIN: Warm and dry NEUROLOGIC:  Alert and oriented x 3 PSYCHIATRIC:  Normal affect   EKG:  EKG is ordered today.  The ekg ordered today demonstrates atrial fibrillation with rate well-controlled at 56 bpm, LBBB   Diagnoses:    1. Acute HFrEF (heart failure with reduced ejection fraction) (HCC)   2. Persistent atrial fibrillation ([place])   3. Hypertensive heart disease without heart failure   4. Morbid obesity ([place])   5. LBBB (left bundle branch block)   6. Chronic anticoagulation   7. Essential hypertension   8. OSA on CPAP     Assessment and Plan:     Acute HFrEF: LVEF reduced to 30 to 35% (normal in 2018), global hypokinesis, mild LVH on echo 04/17/22. Reports DOE but has felt that it has been stable. Continues to exercise at same pace. No orthopnea, PND, or edema. Has bendopnea, struggled with getting boots on today. Will d/c amlodipine and start Entresto 49-51 mg twice daily. Reviewed CHF guidelines including daily weights, low sodium diet, and limit fluids to 2 L daily. Will see him back in a few weeks to monitor kidney function and tolerance.   Left bundle branch block:  LBBB on EKG today, seen initially  at last office visit. Marland Kitchen  HR is stable at 56 bpm. 2D echo reveals newly reduced LV function. As noted above, will initiate GDMT with close follow-up. Consider referral to EP if no improvement on medical therapy or if he becomes symptomatic.    Persistent atrial  fibrillation on chronic anticoagulation: Atrial fibrillation at well-controlled ventricular rate on EKG today. No frequent palpitations or concerns for tachycardia. Overall, not very symptomatic. Continue rate control strategy.  No bleeding concerns. Xarelto 20 mg daily is appropriate dose for Scr. Continue Toprol XL, Xarelto.  Hypertension: BP is mildly elevated today. We are discontinuing amlodipine and starting Entresto 49-51 twice daily.  Encouraged him to monitor home BP and report consistently abnormal readings.   OSA: Compliant with CPAP, no concerns.   Morbid obesity: Encouraged weight loss, heart healthy low sodium diet < 2000 mg daily, 150 minutes of moderate intensity exercise each week. Is on Ozempic but has had difficulty with shortages. Encouraged him to notify prescribing provider.   Hyperlipidemia: LDL 68 on 03/13/22. Continue rosuvastatin.      Disposition: 4 weeks with me  Medication Adjustments/Labs and Tests Ordered: Current medicines are reviewed at length with the patient today.  Concerns regarding medicines are outlined above.  Orders Placed This Encounter  Procedures   EKG 12-Lead   Meds ordered this encounter  Medications   sacubitril-valsartan (ENTRESTO) 49-51 MG    Sig: Take 1 tablet by mouth 2 (two) times daily.    Dispense:  60 tablet    Refill:  0    Please Honor Card patient is presenting for Carmie Kanner: [id number]; Juanna Cao: [phone]; XBWIO: [facility]; MBTD: [phone]    Patient Instructions  Medication Instructions:  Your physician has recommended you make the following change in your medication:  STOP Amlodipine START Entresto 49-51 mg twice daily - take it 12 hours apart to the best of your ability  *If you need a refill on your cardiac medications before your next appointment, please call your pharmacy*   Lab Work:  If you have labs (blood work) drawn today and your tests are completely normal, you will receive your results only by: [place] (if you have MyChart) OR A paper copy in the mail If you have any lab test that is abnormal or we need to change your treatment, we will call you to review the results.   Testing/Procedures:    Follow-Up: At [facility], you and your health needs are our priority.  As part of our continuing mission to provide you with exceptional heart care, we have created designated Provider Care Teams.  These Care Teams include your primary Cardiologist (physician) and Advanced Practice Providers (APPs -  Physician Assistants and Nurse Practitioners) who all work together to provide you with the care you need, when you need it.  We recommend signing up for the patient portal called "MyChart".  Sign up information is provided on this After Visit Summary.  MyChart is used to connect with patients for Virtual Visits (Telemedicine).  Patients are able to view lab/test results, encounter notes, upcoming appointments, etc.  Non-urgent messages can be sent to your provider as well.   To learn more about what you can do with MyChart, go to [email or web address].    Your next appointment:   1 month(s)  The format for your next appointment:   In Person  Provider:   [name], NP     Then, Larae Grooms, MD will plan to see you again in 3  month(s).    Other Instructions  Your physician has requested that you regularly monitor and record your blood pressure readings at home. Please use the same machine at the same time of day to check your readings and record them to bring to your follow-up visit.   Please monitor blood pressures and keep a log of your readings.   Let us know if it's too high or too low   Make sure to check 2 hours after your medications.    AVOID these things for 30 minutes before checking your blood pressure: No Drinking caffeine. No Drinking alcohol. No Eating. No Smoking. No Exercising.   Five minutes before checking your blood pressure: Pee. Sit in  a dining chair. Avoid sitting in a soft couch or armchair. Be quiet. Do not talk     Important Information About Sugar         Signed, Emmaline Life, NP  04/19/2022 4:33 PM    [place]

## 2022-04-19 ENCOUNTER — Encounter: Payer: Self-pay | Admitting: Nurse Practitioner

## 2022-04-19 ENCOUNTER — Ambulatory Visit: Payer: Medicare Other | Attending: Nurse Practitioner | Admitting: Nurse Practitioner

## 2022-04-19 VITALS — BP 144/90 | HR 56 | Ht 74.0 in | Wt 317.4 lb

## 2022-04-19 DIAGNOSIS — I5021 Acute systolic (congestive) heart failure: Secondary | ICD-10-CM

## 2022-04-19 DIAGNOSIS — I4819 Other persistent atrial fibrillation: Secondary | ICD-10-CM

## 2022-04-19 DIAGNOSIS — I119 Hypertensive heart disease without heart failure: Secondary | ICD-10-CM | POA: Diagnosis not present

## 2022-04-19 DIAGNOSIS — I10 Essential (primary) hypertension: Secondary | ICD-10-CM

## 2022-04-19 DIAGNOSIS — G4733 Obstructive sleep apnea (adult) (pediatric): Secondary | ICD-10-CM

## 2022-04-19 DIAGNOSIS — Z7901 Long term (current) use of anticoagulants: Secondary | ICD-10-CM

## 2022-04-19 DIAGNOSIS — I447 Left bundle-branch block, unspecified: Secondary | ICD-10-CM

## 2022-04-19 MED ORDER — ENTRESTO 49-51 MG PO TABS: 1.0000 | ORAL_TABLET | Freq: Two times a day (BID) | ORAL | 0 refills | Status: DC

## 2022-04-19 NOTE — Patient Instructions (Addendum)
Medication Instructions:  Your physician has recommended you make the following change in your medication:  STOP Amlodipine START Entresto 49-51 mg twice daily - take it 12 hours apart to the best of your ability  *If you need a refill on your cardiac medications before your next appointment, please call your pharmacy*   Lab Work:  If you have labs (blood work) drawn today and your tests are completely normal, you will receive your results only by: [place] (if you have MyChart) OR A paper copy in the mail If you have any lab test that is abnormal or we need to change your treatment, we will call you to review the results.   Testing/Procedures:    Follow-Up: At [facility], you and your health needs are our priority.  As part of our continuing mission to provide you with exceptional heart care, we have created designated Provider Care Teams.  These Care Teams include your primary Cardiologist (physician) and Advanced Practice Providers (APPs -  Physician Assistants and Nurse Practitioners) who all work together to provide you with the care you need, when you need it.  We recommend signing up for the patient portal called "MyChart".  Sign up information is provided on this After Visit Summary.  MyChart is used to connect with patients for Virtual Visits (Telemedicine).  Patients are able to view lab/test results, encounter notes, upcoming appointments, etc.  Non-urgent messages can be sent to your provider as well.   To learn more about what you can do with MyChart, go to [email or web address].    Your next appointment:   1 month(s)  The format for your next appointment:   In Person  Provider:   [name], NP     Then, Larae Grooms, MD will plan to see you again in 3 month(s).    Other Instructions  Your physician has requested that you regularly monitor and record your blood pressure readings at home. Please use the same machine at the same time of  day to check your readings and record them to bring to your follow-up visit.   Please monitor blood pressures and keep a log of your readings.   Let us know if it's too high or too low   Make sure to check 2 hours after your medications.    AVOID these things for 30 minutes before checking your blood pressure: No Drinking caffeine. No Drinking alcohol. No Eating. No Smoking. No Exercising.   Five minutes before checking your blood pressure: Pee. Sit in a dining chair. Avoid sitting in a soft couch or armchair. Be quiet. Do not talk     Important Information About Sugar

## 2022-04-20 ENCOUNTER — Encounter: Payer: Self-pay | Admitting: Internal Medicine

## 2022-04-20 DIAGNOSIS — I5022 Chronic systolic (congestive) heart failure: Secondary | ICD-10-CM | POA: Insufficient documentation

## 2022-04-26 ENCOUNTER — Telehealth: Payer: Self-pay | Admitting: Nurse Practitioner

## 2022-04-26 NOTE — Telephone Encounter (Signed)
Patient called to talk with [name] about the medication Entresto. Patient is saying that medication is not working

## 2022-04-26 NOTE — Telephone Encounter (Signed)
Spoke with pt who is reporting he started [place] as ordered on Friday (12/22).  Normally his BP is around 118-125/70s.  This AM his BP is 135/91 however pt has not taken his medication today.  Yesterday BP was 138/91 at 5 am - took AM meds at 8:10 am and at 11 am BP was 130/79 - early afternoon it was 120/62.  Advised pt to try to make sure he is taking his medication as close to every 12 hours as possible and continue to monitor his BP.  Advised I will forward this information to [place] but she is not in the office this week.  If BP worsen before her review, to call back to notify the office.  Of note, BP the day Entresto was RXed was 144/90 (12/20)

## 2022-05-17 NOTE — Progress Notes (Signed)
Cardiology Office Note:    Date:  05/19/2022   ID:  Isaiah Jones, DOB 1950/05/11, MRN [id number]  PCP:  Loralee Pacas, MD   [facility] Providers Cardiologist:  Larae Grooms, MD     Referring MD: Loralee Pacas, MD   Chief Complaint: follow-up atrial fibrillation  History of Present Illness:    Isaiah Jones is a very pleasant 72 y.o. male with a hx of HTN, obesity, HLD, OSA on CPAP, and persistent atrial fibrillation.  Referred to cardiology by PCP for new onset atrial fibrillation and seen by Richardson Dopp, PA on 07/11/2016. Was noted to have an irregular heartbeat at his pulmonology follow-up.  EKG at PCP office confirmed atrial fibrillation. Had symptoms of fatigue and shortness of breath, also occasional palpitations. He was started on metoprolol and Xarelto and advised to work on weight loss. LV function at that time was 55-60%, mild LVH, G1DD. Unsuccessful cardioversion 07/2016, underwent weight loss surgery 06/2019. He has maintained consistent follow-up.  Last cardiology clinic visit was 07/25/2021 with Dr. Irish Lack. He continued to work on weight loss. Rate control strategy for a fib. No specific cardiac concerns. Recommendation to follow-up in 9 months.  Seen by me on 03/27/22 accompanied by his wife for follow-up. Reports he is dealing with swallowing issues but no cardiac concerns. Has shortness of breath with exertion that he feels is stable and improves when he exercises consistently. Walks or rides his bicycle for exercise but admits this is not consistent. Sleeping well, no concerns with CPAP. Occasional bilateral LE edema that resolves overnight. Feels his HR speed up when drinking coffee, otherwise no palpitations, or concerns for tachycardia. He denied chest pain, lower extremity edema, fatigue, melena, hematuria, hemoptysis, diaphoresis, weakness, presyncope, syncope, orthopnea, and PND.  He had a newly identified LBBB on EKG.  Echocardiogram was ordered which  revealed significant decrease in LVEF to 30 to 35%, global HK of LV, mild LVH, unable to evaluate diastolic function, RV SF moderately reduced, normal RV size. I advised him to return for an office visit to initiate GDMT for acute CHF.  Seen by me on 04/19/22 to discuss recent echo results from 04/17/22 which revealed LVEF reduced to 30 to 35% (normal in 2018), global hypokinesis, mild LVH. His wife accompanied him. Reports an additional recent finding on EGD that showed achalasia and has been referred to [facility]. C/o DOE that he felt was stable. Continuing to walk and ride stationary bike for exercise without change in activity tolerance. No orthopnea, edema, or PND. Has bendopnea. Questions about medical therapy answered to his satisfaction. Amlodipine was discontinued and he was advised to start Entresto 49-51 mg twice daily. CHF guidelines including daily weights, low-sodium diet, and fluid limitation to 2 L daily were discussed.   Lab work 05/05/22 revealed BNP 275, Scr 1.0  Today, he is here with his wife for follow-up. Reports he started Scnetx 04/21/22 and has tolerated it well.  Has occasional lightheadedness but no presyncope, syncope. Is scheduled for throat surgery on 06/07/22 at [facility], we reviewed his risks. Is riding his recumbent bike 30 minutes with increasing intensity, goal is to increase to 1 hour ride. Monitors HR and BP after riding for 30 minutes and then walking up 15 stairs to sit by his BP monitor. BP/HR when he gets to the chair are 120s over 70s, HR 55-60. While riding, HR usually 102-150 bpm. Says he feels a little winded after riding but makes the walk up the stairs  without any problem. No chest pain, palpitations, orthopnea, edema, or PND.   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    CHF (congestive heart failure) (HCC)    Colon polyps     Complication of anesthesia    Depression    Dyslipidemia    Dysrhythmia 2017   A fib   GERD (gastroesophageal reflux disease)    Glaucoma    early stage   History of 2019 novel coronavirus disease (COVID-19) 06/2019   History of kidney stones    Hypertension    Hypertriglyceridemia 03/14/2022   Lab Results Component Value Date/Time  TRIG 154.0 (H) 03/13/2022 08:53 AM  TRIG 188.0 (H) 06/14/2021 09:38 AM  TRIG 203.0 (H) 06/11/2020 09:38 AM  TRIG 166.0 (H) 06/11/2019 09:44 AM  TRIG 177 (H) 02/18/2019 08:36 AM     OSA on CPAP    C-Pap   Persistent atrial fibrillation ([place])    Echo 3/18: Mild LVH, EF 55-60, normal wall motion, grade 1 diastolic dysfunction, mild LAE   Pneumonia    childhood   PONV (postoperative nausea and vomiting)    after 1st knee surgery only   Rhinitis, allergic     Past Surgical History:  Procedure Laterality Date   arthroscopic knee surgery Bilateral 2003   CARDIOVERSION N/A 08/10/2016   Procedure: CARDIOVERSION;  Surgeon: Pixie Casino, MD;  Location: [place];  Service: Cardiovascular;  Laterality: N/A;   COLONOSCOPY     CYSTOSCOPY WITH RETROGRADE PYELOGRAM, URETEROSCOPY AND STENT PLACEMENT Left 03/04/2021   Procedure: CYSTOSCOPY WITH BILATERAL RETROGRADES,PYELOGRAM, URETEROSCOPY AND STENT PLACEMENT;  Surgeon: Alexis Frock, MD;  Location: [facility];  Service: Urology;  Laterality: Left;  1 HR   ESOPHAGEAL MANOMETRY N/A 03/22/2022   Procedure: ESOPHAGEAL MANOMETRY (EM);  Surgeon: Sharyn Creamer, MD;  Location: [facility];  Service: Gastroenterology;  Laterality: N/A;   HOLMIUM LASER APPLICATION Left 29/12/3714   Procedure: HOLMIUM LASER APPLICATION;  Surgeon: Alexis Frock, MD;  Location: [facility];  Service: Urology;  Laterality: Left;   LAPAROSCOPIC GASTRIC SLEEVE RESECTION N/A 07/15/2019   Procedure: LAPAROSCOPIC GASTRIC SLEEVE RESECTION, Upper Endo, ERAS Pathway;  Surgeon: [name], MD;  Location: [facility];  Service: General;  Laterality:  N/A;   REPLACEMENT TOTAL KNEE Bilateral 2008   TONSILLECTOMY     TOTAL HIP ARTHROPLASTY Right 10/21/2019   Procedure: RIGHT TOTAL HIP ARTHROPLASTY ANTERIOR APPROACH;  Surgeon: Melrose Nakayama, MD;  Location: [facility];  Service: Orthopedics;  Laterality: Right;   VASECTOMY      Current Medications: Current Meds  Medication Sig   acetaminophen (TYLENOL) 500 MG tablet Take 500 mg by mouth every 6 (six) hours as needed (for pain.).   Calcium Carb-Cholecalciferol (CALCIUM-VITAMIN D) 600-400 MG-UNIT TABS Take 1 tablet by mouth in the morning, at noon, and at bedtime.   Calcium Carbonate-Vit D-Min (CALCIUM 600+D PLUS MINERALS) 600-400 MG-UNIT TABS Take 1 each by mouth daily as needed.   diphenhydramine-acetaminophen (TYLENOL PM) 25-500 MG TABS tablet Take 2 tablets by mouth at bedtime.    fexofenadine (ALLEGRA) 180 MG tablet Take 180 mg by mouth daily as needed for allergies or rhinitis.   Ginkgo Biloba 60 MG TABS Take 60 mg by mouth daily.   latanoprost (XALATAN) 0.005 % ophthalmic solution Place 1 drop into both eyes at bedtime.    methocarbamol (ROBAXIN) 750 MG tablet Take 1 tablet (750  mg total) by mouth every 6 (six) hours as needed for muscle spasms.   metoprolol succinate (TOPROL-XL) 25 MG 24 hr tablet TAKE 1 AND 1/2 TABLETS     DAILY WITH OR IMMEDIATELY  FOLLOWING A MEAL   mometasone (NASONEX) 50 MCG/ACT nasal spray Place 1 spray into the nose at bedtime.   Multiple Vitamin (MULTIVITAMIN WITH MINERALS) TABS tablet Take 2 tablets by mouth daily. Bariatric Multivitamin   Multiple Vitamins-Minerals (AIRBORNE PO) Take 1 tablet by mouth daily as needed (immune health).    rivaroxaban (XARELTO) 20 MG TABS tablet TAKE 1 TABLET DAILY WITH   SUPPER   rosuvastatin (CRESTOR) 20 MG tablet TAKE 1 TABLET DAILY   Semaglutide-Weight Management 1.7 MG/0.75ML SOAJ Inject 1.7 mg into the skin once a week for 28 days.   [START ON 06/13/2022] Semaglutide-Weight Management 2.4 MG/0.75ML SOAJ Inject 2.4 mg into the  skin once a week for 28 days.   sertraline (ZOLOFT) 50 MG tablet TAKE 1 TABLET DAILY   Thiamine HCl (VITAMIN B1 PO) Take by mouth.   [DISCONTINUED] sacubitril-valsartan (ENTRESTO) 49-51 MG Take 1 tablet by mouth 2 (two) times daily.   [DISCONTINUED] spironolactone (ALDACTONE) 25 MG tablet Take 0.5 tablets (12.5 mg total) by mouth daily.     Allergies:   Erythromycin   Social History   Socioeconomic History   Marital status: Married    Spouse name: Not on file   Number of children: Not on file   Years of education: Not on file   Highest education level: Not on file  Occupational History   Occupation: Retired   Tobacco Use   Smoking status: Former    Types: Cigarettes    Quit date: 07/04/2001    Years since quitting: 20.8   Smokeless tobacco: Never  Vaping Use   Vaping Use: Never used  Substance and Sexual Activity   Alcohol use: Yes    Comment: rare   Drug use: Never   Sexual activity: Yes    Comment: MARRIED  Other Topics Concern   Not on file  Social History Narrative   Lives in [place], [place]   Married   2 kids   Retired - Prior worked at [employer] Strain: Not on [place]: Not on file  Transportation Needs: Not on file  Physical Activity: Insufficiently Active (05/19/2022)   Exercise Vital Sign    Days of Exercise per Week: 2 days    Minutes of Exercise per Session: 30 min  Stress: No Stress Concern Present (05/19/2022)   [place] of Stress : Not at all  Social Connections: Unknown (05/15/2022)   Social Connection and Isolation Panel [NHANES]    Frequency of Communication with Friends and Family: Not on file    Frequency of Social Gatherings with Friends and Family: Not on file    Attends Religious Services: More than 4 times per year    Active Member of [employer] or Organizations: Not on file    Attends Archivist  Meetings: Not on file    Marital Status: Not on file     Family History: The patient's family history includes Alzheimer's disease in his mother; Arthritis in his father and mother; Esophageal cancer in his father; Heart attack in his mother; Heart attack (age of onset: 16) in his father; Hyperlipidemia in his brother, father, and mother; Hypertension in his brother, father,  and mother; Other in his brother. There is no history of Atrial fibrillation.  ROS:   Please see the history of present illness.  All other systems reviewed and are negative.  Labs/Other Studies Reviewed:    The following studies were reviewed today:  Echo 04/17/22  1. Compared to echo report from 2018, LVEF is worse.   2. Difficult acoustic windows, particularly apically. Diffuse  hypokinesis, worse in the anterior and septal walls. COnsider limited echo  with Defiinity to better define LVEF and regional wall motion.   3. Left ventricular ejection fraction, by estimation, is 30 to 35%. The  left ventricle has moderately decreased function. The left ventricle  demonstrates global hypokinesis. There is mild concentric left ventricular  hypertrophy. Left ventricular  diastolic function could not be evaluated.   4. Right ventricular systolic function is moderately reduced. The right  ventricular size is normal.   5. Left atrial size was moderately dilated.   6. Right atrial size was severely dilated.   7. The mitral valve is normal in structure. Mild mitral valve  regurgitation.   8. The aortic valve is tricuspid. Aortic valve regurgitation is not  visualized.   9. The inferior vena cava is normal in size with greater than 50%  respiratory variability, suggesting right atrial pressure of 3 mmHg.  Cardiac monitor 01/28/21   Atrial fibrillation with average HR in the high 50s to low 70s range. Pauses noted during sleeping hours.   Echo 07/11/16  Left ventricle: The cavity size was normal. Wall thickness was     increased in a pattern of mild LVH. Systolic function was normal.    The estimated ejection fraction was in the range of 55% to 60%.    Wall motion was normal; there were no regional wall motion    abnormalities. Doppler parameters are consistent with abnormal    left ventricular relaxation (grade 1 diastolic dysfunction).  - Left atrium: The atrium was mildly dilated. Volume/bsa, S: 37.4    ml/m^2.   Recent Labs: 03/13/2022: ALT 21; BUN 15; Creatinine, Ser 0.92; Hemoglobin 16.7; Platelets 174.0; Potassium 4.5; Sodium 139; TSH 1.36  Recent Lipid Panel    Component Value Date/Time   CHOL 148 03/13/2022 0853   CHOL 133 02/18/2019 0836   TRIG 154.0 (H) 03/13/2022 0853   HDL 48.70 03/13/2022 0853   HDL 40 02/18/2019 0836   CHOLHDL 3 03/13/2022 0853   VLDL 30.8 03/13/2022 0853   LDLCALC 68 03/13/2022 0853   LDLCALC 63 02/18/2019 0836   LDLDIRECT 76.0 06/11/2020 0938     Risk Assessment/Calculations:    CHA2DS2-VASc Score = 3  {This indicates a 3.2% annual risk of stroke. The patient's score is based upon: CHF History: 1 HTN History: 1 Diabetes History: 0 Stroke History: 0 Vascular Disease History: 0 Age Score: 1 Gender Score: 0    Physical Exam:    VS:  BP 128/76   Pulse (!) 47   Ht '6\' 3"'$  (1.905 m)   Wt (!) 315 lb 3.2 oz (143 kg)   SpO2 97%   BMI 39.40 kg/m     Wt Readings from Last 3 Encounters:  05/19/22 (!) 315 lb 3.2 oz (143 kg)  05/19/22 (!) 318 lb 9.6 oz (144.5 kg)  04/19/22 (!) 317 lb 6.4 oz (144 kg)     GEN:  Obese male in no acute distress HEENT: Normal NECK: No JVD; No carotid bruits CARDIAC: Irregular RR, no murmurs, rubs, gallops RESPIRATORY:  Clear to auscultation without  rales, wheezing or rhonchi  ABDOMEN: Soft, non-tender, protuberant MUSCULOSKELETAL:  No edema; No deformity. 2+ pedal pulses, equal bilaterally SKIN: Warm and dry NEUROLOGIC:  Alert and oriented x 3 PSYCHIATRIC:  Normal affect   EKG:  EKG is not ordered  today   Diagnoses:    1. Chronic HFrEF (heart failure with reduced ejection fraction) (HCC)   2. Persistent atrial fibrillation ([place])   3. LBBB (left bundle branch block)   4. Morbid obesity ([place])   5. OSA on CPAP   6. Mixed hyperlipidemia   7. Chronic anticoagulation   8. Essential hypertension   9. Cardiomyopathy, unspecified type ([place])     Assessment and Plan:     HFrEF/Cardiomyopathy: Echo 04/17/22 revealed newly reduced LVEF 30 to 35% (normal in 2018), global hypokinesis, mild LVH. He was having DOE that he felt was stable, secondary to weight. Has continued to exercise at same pace. No orthopnea, PND, or edema. Reports weight is stable. Continue to limit sodium to < 2000 mg, limit fluids to 2 L daily.  Tolerating Entresto with out adverse side effects. He is feeling well. Lengthy discussion about GDMT for CHF. Would like to try generic medication first. Will add spironolactone 12.5 mg daily. Will get BMP on 06/02/22.    Left bundle branch block:  LBBB known. HR has been stable. Newly reduced LV function on echo 03/2022. Continuing up titration of GDMT with close follow-up. Consider referral to EP if no improvement on medical therapy or if he becomes symptomatic.    Persistent atrial fibrillation on chronic anticoagulation: Clinically appears to be in atrial fibrillation.HR is  well controlled. No frequent palpitations or concerns for tachycardia. Overall, not very symptomatic. Continue rate control strategy with Toprol XL. No bleeding concerns. Continue Xarelto 20 mg daily, which is appropriate dose for Scr, for stroke prevention.   Hypertension: BP is well controlled   OSA: Compliant with CPAP, no concerns.   Morbid obesity: Encouraged weight loss, heart healthy low sodium diet < 2000 mg daily, 150 minutes of moderate intensity exercise each week. Is on Ozempic but has had difficulty with shortages. Encouraged him to notify prescribing provider.   Hyperlipidemia: LDL 68 on  03/13/22. Continue rosuvastatin.      Disposition: 2 months with Dr. Irish Lack  Medication Adjustments/Labs and Tests Ordered: Current medicines are reviewed at length with the patient today.  Concerns regarding medicines are outlined above.  Orders Placed This Encounter  Procedures   Basic Metabolic Panel (BMET)   Meds ordered this encounter  Medications   DISCONTD: sacubitril-valsartan (ENTRESTO) 49-51 MG    Sig: Take 1 tablet by mouth 2 (two) times daily.    Dispense:  60 tablet    Refill:  0    Please Honor Card patient is presenting for Carmie Kanner: [id number]; Juanna Cao: [id number]; VPXTG: OHS; GYIR: [id number]   DISCONTD: spironolactone (ALDACTONE) 25 MG tablet    Sig: Take 0.5 tablets (12.5 mg total) by mouth daily.    Dispense:  15 tablet    Refill:  0   sacubitril-valsartan (ENTRESTO) 49-51 MG    Sig: Take 1 tablet by mouth 2 (two) times daily.    Dispense:  180 tablet    Refill:  3    Please Honor Card patient is presenting for Carmie Kanner: [id number]; Juanna Cao: [id number]; ELFYB: OHS; OFBP: [id number]   spironolactone (ALDACTONE) 25 MG tablet    Sig: Take 0.5 tablets (12.5 mg total) by mouth daily.    Dispense:  45 tablet  Refill:  3    Patient Instructions  Medication Instructions:   START Aldactone one half (0.5) tablet by mouth ( 12.5 mg) daily.  *If you need a refill on your cardiac medications before your next appointment, please call your pharmacy*   Lab Work:  Your physician recommends that you return for lab work on Friday, February 2. You can come in on the day of your appointment anytime between 7:30-4:30.   If you have labs (blood work) drawn today and your tests are completely normal, you will receive your results only by: [place] (if you have MyChart) OR A paper copy in the mail If you have any lab test that is abnormal or we need to change your treatment, we will call you to review the results.   Testing/Procedures:  None  ordered.   Follow-Up: At [facility], you and your health needs are our priority.  As part of our continuing mission to provide you with exceptional heart care, we have created designated Provider Care Teams.  These Care Teams include your primary Cardiologist (physician) and Advanced Practice Providers (APPs -  Physician Assistants and Nurse Practitioners) who all work together to provide you with the care you need, when you need it.  We recommend signing up for the patient portal called "MyChart".  Sign up information is provided on this After Visit Summary.  MyChart is used to connect with patients for Virtual Visits (Telemedicine).  Patients are able to view lab/test results, encounter notes, upcoming appointments, etc.  Non-urgent messages can be sent to your provider as well.   To learn more about what you can do with MyChart, go to [email or web address].    Your next appointment:   3 month(s)  Provider:   Larae Grooms, MD       Signed, Emmaline Life, NP  05/19/2022 5:04 PM    [place]

## 2022-05-19 ENCOUNTER — Ambulatory Visit (INDEPENDENT_AMBULATORY_CARE_PROVIDER_SITE_OTHER): Payer: Medicare Other

## 2022-05-19 ENCOUNTER — Encounter: Payer: Self-pay | Admitting: Nurse Practitioner

## 2022-05-19 ENCOUNTER — Ambulatory Visit: Payer: Medicare Other | Attending: Nurse Practitioner | Admitting: Nurse Practitioner

## 2022-05-19 VITALS — BP 130/78 | HR 65 | Temp 97.5°F | Wt 318.6 lb

## 2022-05-19 VITALS — BP 128/76 | HR 47 | Ht 75.0 in | Wt 315.2 lb

## 2022-05-19 DIAGNOSIS — I429 Cardiomyopathy, unspecified: Secondary | ICD-10-CM

## 2022-05-19 DIAGNOSIS — I10 Essential (primary) hypertension: Secondary | ICD-10-CM

## 2022-05-19 DIAGNOSIS — I447 Left bundle-branch block, unspecified: Secondary | ICD-10-CM | POA: Diagnosis not present

## 2022-05-19 DIAGNOSIS — Z0000 Encounter for general adult medical examination without abnormal findings: Secondary | ICD-10-CM | POA: Diagnosis not present

## 2022-05-19 DIAGNOSIS — I5022 Chronic systolic (congestive) heart failure: Secondary | ICD-10-CM

## 2022-05-19 DIAGNOSIS — E782 Mixed hyperlipidemia: Secondary | ICD-10-CM

## 2022-05-19 DIAGNOSIS — Z7901 Long term (current) use of anticoagulants: Secondary | ICD-10-CM

## 2022-05-19 DIAGNOSIS — I4819 Other persistent atrial fibrillation: Secondary | ICD-10-CM

## 2022-05-19 DIAGNOSIS — G4733 Obstructive sleep apnea (adult) (pediatric): Secondary | ICD-10-CM

## 2022-05-19 MED ORDER — SPIRONOLACTONE 25 MG PO TABS: 12.5000 mg | ORAL_TABLET | Freq: Every day | ORAL | 0 refills | Status: DC

## 2022-05-19 MED ORDER — ENTRESTO 49-51 MG PO TABS: 1.0000 | ORAL_TABLET | Freq: Two times a day (BID) | ORAL | 3 refills | Status: DC

## 2022-05-19 MED ORDER — ENTRESTO 49-51 MG PO TABS: 1.0000 | ORAL_TABLET | Freq: Two times a day (BID) | ORAL | 0 refills | Status: DC

## 2022-05-19 MED ORDER — SPIRONOLACTONE 25 MG PO TABS: 12.5000 mg | ORAL_TABLET | Freq: Every day | ORAL | 3 refills | Status: DC

## 2022-05-19 NOTE — Patient Instructions (Signed)
Medication Instructions:   START Aldactone one half (0.5) tablet by mouth ( 12.5 mg) daily.  *If you need a refill on your cardiac medications before your next appointment, please call your pharmacy*   Lab Work:  Your physician recommends that you return for lab work on Friday, February 2. You can come in on the day of your appointment anytime between 7:30-4:30.   If you have labs (blood work) drawn today and your tests are completely normal, you will receive your results only by: [place] (if you have MyChart) OR A paper copy in the mail If you have any lab test that is abnormal or we need to change your treatment, we will call you to review the results.   Testing/Procedures:  None ordered.   Follow-Up: At [facility], you and your health needs are our priority.  As part of our continuing mission to provide you with exceptional heart care, we have created designated Provider Care Teams.  These Care Teams include your primary Cardiologist (physician) and Advanced Practice Providers (APPs -  Physician Assistants and Nurse Practitioners) who all work together to provide you with the care you need, when you need it.  We recommend signing up for the patient portal called "MyChart".  Sign up information is provided on this After Visit Summary.  MyChart is used to connect with patients for Virtual Visits (Telemedicine).  Patients are able to view lab/test results, encounter notes, upcoming appointments, etc.  Non-urgent messages can be sent to your provider as well.   To learn more about what you can do with MyChart, go to [email or web address].    Your next appointment:   3 month(s)  Provider:   Larae Grooms, MD

## 2022-05-19 NOTE — Patient Instructions (Signed)
Isaiah Jones , Thank you for taking time to come for your Medicare Wellness Visit. I appreciate your ongoing commitment to your health goals. Please review the following plan we discussed and let me know if I can assist you in the future.   These are the goals we discussed:  Goals       Exercise 150 min/wk Moderate Activity      Would like to restart regular exercise       Weight (lb) < 300 lb (136.1 kg) (pt-stated)      Would like to reach goal weight in order to have hip surgery         This is a list of the screening recommended for you and due dates:  Health Maintenance  Topic Date Due   DTaP/Tdap/Td vaccine (1 - Tdap) Never done   Flu Shot  07/31/2022*   Medicare Annual Wellness Visit  05/20/2023   Colon Cancer Screening  05/01/2026   Hepatitis C Screening: USPSTF Recommendation to screen - Ages 18-79 yo.  Completed   HPV Vaccine  Aged Out   Pneumonia Vaccine  Discontinued   COVID-19 Vaccine  Discontinued   Zoster (Shingles) Vaccine  Discontinued  *Topic was postponed. The date shown is not the original due date.    Advanced directives: Advance directive discussed with you today. Even though you declined this today please call our office should you change your mind and we can give you the proper paperwork for you to fill out.  Conditions/risks identified: get some of the weight off   Next appointment: Follow up in one year for your annual wellness visit.   Preventive Care 83 Years and Older, Male  Preventive care refers to lifestyle choices and visits with your health care provider that can promote health and wellness. What does preventive care include? A yearly physical exam. This is also called an annual well check. Dental exams once or twice a year. Routine eye exams. Ask your health care provider how often you should have your eyes checked. Personal lifestyle choices, including: Daily care of your teeth and gums. Regular physical activity. Eating a healthy  diet. Avoiding tobacco and drug use. Limiting alcohol use. Practicing safe sex. Taking low doses of aspirin every day. Taking vitamin and mineral supplements as recommended by your health care provider. What happens during an annual well check? The services and screenings done by your health care provider during your annual well check will depend on your age, overall health, lifestyle risk factors, and family history of disease. Counseling  Your health care provider may ask you questions about your: Alcohol use. Tobacco use. Drug use. Emotional well-being. Home and relationship well-being. Sexual activity. Eating habits. History of falls. Memory and ability to understand (cognition). Work and work Statistician. Screening  You may have the following tests or measurements: Height, weight, and BMI. Blood pressure. Lipid and cholesterol levels. These may be checked every 5 years, or more frequently if you are over 9 years old. Skin check. Lung cancer screening. You may have this screening every year starting at age 10 if you have a 30-pack-year history of smoking and currently smoke or have quit within the past 15 years. Fecal occult blood test (FOBT) of the stool. You may have this test every year starting at age 96. Flexible sigmoidoscopy or colonoscopy. You may have a sigmoidoscopy every 5 years or a colonoscopy every 10 years starting at age 25. Prostate cancer screening. Recommendations will vary depending on your family history  and other risks. Hepatitis C blood test. Hepatitis B blood test. Sexually transmitted disease (STD) testing. Diabetes screening. This is done by checking your blood sugar (glucose) after you have not eaten for a while (fasting). You may have this done every 1-3 years. Abdominal aortic aneurysm (AAA) screening. You may need this if you are a current or former smoker. Osteoporosis. You may be screened starting at age 44 if you are at high risk. Talk with  your health care provider about your test results, treatment options, and if necessary, the need for more tests. Vaccines  Your health care provider may recommend certain vaccines, such as: Influenza vaccine. This is recommended every year. Tetanus, diphtheria, and acellular pertussis (Tdap, Td) vaccine. You may need a Td booster every 10 years. Zoster vaccine. You may need this after age 60. Pneumococcal 13-valent conjugate (PCV13) vaccine. One dose is recommended after age 75. Pneumococcal polysaccharide (PPSV23) vaccine. One dose is recommended after age 32. Talk to your health care provider about which screenings and vaccines you need and how often you need them. This information is not intended to replace advice given to you by your health care provider. Make sure you discuss any questions you have with your health care provider. Document Released: 05/14/2015 Document Revised: 01/05/2016 Document Reviewed: 02/16/2015 Elsevier Interactive Patient Education  2017 [place] Prevention in the Home Falls can cause injuries. They can happen to people of all ages. There are many things you can do to make your home safe and to help prevent falls. What can I do on the outside of my home? Regularly fix the edges of walkways and driveways and fix any cracks. Remove anything that might make you trip as you walk through a door, such as a raised step or threshold. Trim any bushes or trees on the path to your home. Use bright outdoor lighting. Clear any walking paths of anything that might make someone trip, such as rocks or tools. Regularly check to see if handrails are loose or broken. Make sure that both sides of any steps have handrails. Any raised decks and porches should have guardrails on the edges. Have any leaves, snow, or ice cleared regularly. Use sand or salt on walking paths during winter. Clean up any spills in your garage right away. This includes oil or grease spills. What  can I do in the bathroom? Use night lights. Install grab bars by the toilet and in the tub and shower. Do not use towel bars as grab bars. Use non-skid mats or decals in the tub or shower. If you need to sit down in the shower, use a plastic, non-slip stool. Keep the floor dry. Clean up any water that spills on the floor as soon as it happens. Remove soap buildup in the tub or shower regularly. Attach bath mats securely with double-sided non-slip rug tape. Do not have throw rugs and other things on the floor that can make you trip. What can I do in the bedroom? Use night lights. Make sure that you have a light by your bed that is easy to reach. Do not use any sheets or blankets that are too big for your bed. They should not hang down onto the floor. Have a firm chair that has side arms. You can use this for support while you get dressed. Do not have throw rugs and other things on the floor that can make you trip. What can I do in the kitchen? Clean up any spills  right away. Avoid walking on wet floors. Keep items that you use a lot in easy-to-reach places. If you need to reach something above you, use a strong step stool that has a grab bar. Keep electrical cords out of the way. Do not use floor polish or wax that makes floors slippery. If you must use wax, use non-skid floor wax. Do not have throw rugs and other things on the floor that can make you trip. What can I do with my stairs? Do not leave any items on the stairs. Make sure that there are handrails on both sides of the stairs and use them. Fix handrails that are broken or loose. Make sure that handrails are as long as the stairways. Check any carpeting to make sure that it is firmly attached to the stairs. Fix any carpet that is loose or worn. Avoid having throw rugs at the top or bottom of the stairs. If you do have throw rugs, attach them to the floor with carpet tape. Make sure that you have a light switch at the top of the  stairs and the bottom of the stairs. If you do not have them, ask someone to add them for you. What else can I do to help prevent falls? Wear shoes that: Do not have high heels. Have rubber bottoms. Are comfortable and fit you well. Are closed at the toe. Do not wear sandals. If you use a stepladder: Make sure that it is fully opened. Do not climb a closed stepladder. Make sure that both sides of the stepladder are locked into place. Ask someone to hold it for you, if possible. Clearly mark and make sure that you can see: Any grab bars or handrails. First and last steps. Where the edge of each step is. Use tools that help you move around (mobility aids) if they are needed. These include: Canes. Walkers. Scooters. Crutches. Turn on the lights when you go into a dark area. Replace any light bulbs as soon as they burn out. Set up your furniture so you have a clear path. Avoid moving your furniture around. If any of your floors are uneven, fix them. If there are any pets around you, be aware of where they are. Review your medicines with your doctor. Some medicines can make you feel dizzy. This can increase your chance of falling. Ask your doctor what other things that you can do to help prevent falls. This information is not intended to replace advice given to you by your health care provider. Make sure you discuss any questions you have with your health care provider. Document Released: 02/11/2009 Document Revised: 09/23/2015 Document Reviewed: 05/22/2014 Elsevier Interactive Patient Education  2017 [employer].

## 2022-05-19 NOTE — Progress Notes (Signed)
Subjective:   Isaiah Jones is a 72 y.o. male who presents for Medicare Annual/Subsequent preventive examination.  Review of Systems     Cardiac Risk Factors include: advanced age (>6mn, >>69women);dyslipidemia;obesity (BMI >30kg/m2);male gender;hypertension     Objective:    Today's Vitals   05/19/22 0752  BP: 130/78  Pulse: 65  Temp: (!) 97.5 F (36.4 C)  SpO2: 96%  Weight: (!) 318 lb 9.6 oz (144.5 kg)   Body mass index is 40.91 kg/m.     05/19/2022    8:11 AM 02/22/2021    8:02 AM 10/21/2019    1:18 PM 10/14/2019   11:29 AM 08/26/2019    8:46 AM 07/15/2019    4:00 PM 07/07/2019    8:17 AM  Advanced Directives  Does Patient Have a Medical Advance Directive? No No;Yes No No Yes Yes Yes  Type of ASocial research officer, governmentLiving will   Living will;Healthcare Power of AShanikoLiving will HChamberinoLiving will  Does patient want to make changes to medical advance directive?     No - Patient declined No - Patient declined   Copy of [place] Chart?     No - copy requested    Would patient like information on creating a medical advance directive? No - Patient declined  No - Patient declined        Current Medications (verified) Outpatient Encounter Medications as of 05/19/2022  Medication Sig   Calcium Carb-Cholecalciferol (CALCIUM-VITAMIN D) 600-400 MG-UNIT TABS Take 1 tablet by mouth in the morning, at noon, and at bedtime.   Calcium Carbonate-Vit D-Min (CALCIUM 600+D PLUS MINERALS) 600-400 MG-UNIT TABS Take 1 each by mouth daily as needed.   diphenhydramine-acetaminophen (TYLENOL PM) 25-500 MG TABS tablet Take 2 tablets by mouth at bedtime.    Ginkgo Biloba 60 MG TABS Take 60 mg by mouth daily.   latanoprost (XALATAN) 0.005 % ophthalmic solution Place 1 drop into both eyes at bedtime.    methocarbamol (ROBAXIN) 750 MG tablet Take 1 tablet (750 mg total) by mouth every 6 (six)  hours as needed for muscle spasms.   metoprolol succinate (TOPROL-XL) 25 MG 24 hr tablet TAKE 1 AND 1/2 TABLETS     DAILY WITH OR IMMEDIATELY  FOLLOWING A MEAL   mometasone (NASONEX) 50 MCG/ACT nasal spray Place 1 spray into the nose at bedtime.   Multiple Vitamin (MULTIVITAMIN WITH MINERALS) TABS tablet Take 2 tablets by mouth daily. Bariatric Multivitamin   Multiple Vitamins-Minerals (AIRBORNE PO) Take 1 tablet by mouth daily as needed (immune health).    rivaroxaban (XARELTO) 20 MG TABS tablet TAKE 1 TABLET DAILY WITH   SUPPER   rosuvastatin (CRESTOR) 20 MG tablet TAKE 1 TABLET DAILY   sacubitril-valsartan (ENTRESTO) 49-51 MG Take 1 tablet by mouth 2 (two) times daily.   sertraline (ZOLOFT) 50 MG tablet TAKE 1 TABLET DAILY   acetaminophen (TYLENOL) 500 MG tablet Take 500 mg by mouth every 6 (six) hours as needed (for pain.).  (Patient not taking: Reported on 05/19/2022)   allopurinol (ZYLOPRIM) 100 MG tablet Take 1 tablet (100 mg total) by mouth daily. Dont start until steroids haved calmed down the attack, but do not stop if an attack returns. (Patient not taking: Reported on 05/19/2022)   amoxicillin (AMOXIL) 250 MG capsule Take 500 mg by mouth 3 (three) times daily. (Patient not taking: Reported on 05/19/2022)   fexofenadine (ALLEGRA) 180 MG tablet Take 180  mg by mouth daily as needed for allergies or rhinitis. (Patient not taking: Reported on 05/19/2022)   Semaglutide-Weight Management 1.7 MG/0.75ML SOAJ Inject 1.7 mg into the skin once a week for 28 days. (Patient not taking: Reported on 04/19/2022)   [START ON 06/13/2022] Semaglutide-Weight Management 2.4 MG/0.75ML SOAJ Inject 2.4 mg into the skin once a week for 28 days. (Patient not taking: Reported on 04/19/2022)   Thiamine HCl (VITAMIN B1 PO) Take by mouth. (Patient not taking: Reported on 05/19/2022)   [DISCONTINUED] azelastine (ASTELIN) 0.1 % nasal spray Place 1 spray into the nose daily as needed.   [DISCONTINUED] PENNSAID 2 % SOLN Apply 1  Pump topically 4 (four) times daily as needed (aching joints).    [DISCONTINUED] predniSONE (DELTASONE) 20 MG tablet Take 2 pills for 3 days, 1 pill for 4 days (Patient not taking: Reported on 04/19/2022)   [DISCONTINUED] tadalafil (CIALIS) 10 MG tablet Take 1 tablet (10 mg total) by mouth daily as needed for erectile dysfunction.   No facility-administered encounter medications on file as of 05/19/2022.    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    CHF (congestive heart failure) (HCC)    Colon polyps    Complication of anesthesia    Depression    Dyslipidemia    Dysrhythmia 2017   A fib   GERD (gastroesophageal reflux disease)    Glaucoma    early stage   History of 2019 novel coronavirus disease (COVID-19) 06/2019   History of kidney stones    Hypertension    Hypertriglyceridemia 03/14/2022   Lab Results Component Value Date/Time  TRIG 154.0 (H) 03/13/2022 08:53 AM  TRIG 188.0 (H) 06/14/2021 09:38 AM  TRIG 203.0 (H) 06/11/2020 09:38 AM  TRIG 166.0 (H) 06/11/2019 09:44 AM  TRIG 177 (H) 02/18/2019 08:36 AM     OSA on CPAP    C-Pap   Persistent atrial fibrillation (Jack)    Echo 3/18: Mild LVH, EF 55-60, normal wall motion, grade 1 diastolic dysfunction, mild LAE   Pneumonia    childhood   PONV (postoperative nausea and vomiting)    after 1st knee surgery only   Rhinitis, allergic    Past Surgical History:  Procedure Laterality Date   arthroscopic knee surgery Bilateral 2003   CARDIOVERSION N/A 08/10/2016   Procedure: CARDIOVERSION;  Surgeon: Pixie Casino, MD;  Location: [facility];  Service: Cardiovascular;  Laterality: N/A;   COLONOSCOPY     CYSTOSCOPY WITH RETROGRADE PYELOGRAM, URETEROSCOPY AND STENT PLACEMENT Left 03/04/2021   Procedure: CYSTOSCOPY WITH BILATERAL  RETROGRADES,PYELOGRAM, URETEROSCOPY AND STENT PLACEMENT;  Surgeon: Alexis Frock, MD;  Location: [facility];  Service: Urology;  Laterality: Left;  1 HR   ESOPHAGEAL MANOMETRY N/A 03/22/2022   Procedure: ESOPHAGEAL MANOMETRY (EM);  Surgeon: Sharyn Creamer, MD;  Location: [facility];  Service: Gastroenterology;  Laterality: N/A;   HOLMIUM LASER APPLICATION Left 16/0/1093   Procedure: HOLMIUM LASER APPLICATION;  Surgeon: Alexis Frock, MD;  Location: [facility];  Service: Urology;  Laterality: Left;   LAPAROSCOPIC GASTRIC SLEEVE RESECTION N/A 07/15/2019   Procedure: LAPAROSCOPIC GASTRIC SLEEVE RESECTION, Upper Endo, ERAS Pathway;  Surgeon: [name], MD;  Location: [facility];  Service: General;  Laterality: N/A;   REPLACEMENT TOTAL KNEE Bilateral 2008   TONSILLECTOMY     TOTAL HIP ARTHROPLASTY Right 10/21/2019  Procedure: RIGHT TOTAL HIP ARTHROPLASTY ANTERIOR APPROACH;  Surgeon: Melrose Nakayama, MD;  Location: [facility];  Service: Orthopedics;  Laterality: Right;   VASECTOMY     Family History  Problem Relation Age of Onset   Alzheimer's disease Mother    Heart attack Mother    Hyperlipidemia Mother    Hypertension Mother    Arthritis Mother    Esophageal cancer Father    Heart attack Father 45   Arthritis Father    Hyperlipidemia Father    Hypertension Father    Hypertension Brother    Hyperlipidemia Brother    Other Brother        CAR ACCIDENT   Atrial fibrillation Neg Hx    Social History   Socioeconomic History   Marital status: Married    Spouse name: Not on file   Number of children: Not on file   Years of education: Not on file   Highest education level: Not on file  Occupational History   Occupation: Retired   Tobacco Use   Smoking status: Former    Types: Cigarettes    Quit date: 07/04/2001    Years since quitting: 20.8   Smokeless tobacco: Never  Vaping Use   Vaping Use: Never used  Substance and Sexual Activity   Alcohol use: Yes    Comment: rare   Drug use:  Never   Sexual activity: Yes    Comment: MARRIED  Other Topics Concern   Not on file  Social History Narrative   Lives in [place], [place]   Married   2 kids   Retired - Prior worked at [employer]: Not on [place]: Not on file  Transportation Needs: Not on file  Physical Activity: Insufficiently Active (05/19/2022)   Exercise Vital Sign    Days of Exercise per Week: 2 days    Minutes of Exercise per Session: 30 min  Stress: No Stress Concern Present (05/19/2022)   Waller of Stress : Not at all  Social Connections: Unknown (05/15/2022)   Social Connection and Isolation Panel [NHANES]    Frequency of Communication with Friends and Family: Not on file    Frequency of Social Gatherings with Friends and Family: Not on file    Attends Religious Services: More than 4 times per year    Active Member of [employer] or Organizations: Not on file    Attends Archivist Meetings: Not on file    Marital Status: Not on file    Tobacco Counseling Counseling given: Not Answered   Clinical Intake:  Pre-visit preparation completed: Yes  Pain : No/denies pain     BMI - recorded: 40.91 Nutritional Status: BMI > 30  Obese Nutritional Risks: None Diabetes: No  How often do you need to have someone help you when you read instructions, pamphlets, or other written materials from your doctor or pharmacy?: 1 - Never  Diabetic?no  Interpreter Needed?: No  Information entered by :: Gwenith Daily, LPN   Activities of Daily Living    05/15/2022    9:20 AM  In your present state of health, do you have any difficulty performing the following activities:  Hearing? 0  Vision? 0  Difficulty concentrating or making decisions? 0  Walking or climbing stairs? 0  Dressing or bathing? 0  Doing errands, shopping? 0  Preparing Food and eating ? N  Using the Toilet? N  In the past six months, have you accidently leaked urine? N  Do you have problems with loss of bowel control? N  Managing your Medications? N  Managing your Finances? N  Housekeeping or managing your Housekeeping? N    Patient Care Team: Loralee Pacas, MD as PCP - General (Internal Medicine) Jettie Booze, MD as PCP - Cardiology (Cardiology) Jettie Booze, MD as Consulting Physician (Cardiology) Lucas Mallow, MD as Consulting Physician (Urology) Trenton Founds, MD as Referring Physician (Gastroenterology) Sherlynn Carbon, MD as Referring Physician (Pulmonary Disease) Melrose Nakayama, MD as Consulting Physician (Orthopedic Surgery) Saddie Benders as Consulting Physician (Dentistry) Normajean Glasgow, MD as Attending Physician (Physical Medicine and Rehabilitation)  Indicate any recent Medical Services you may have received from other than [facility] providers in the past year (date may be approximate).     Assessment:   This is a routine wellness examination for Isaiah Jones.  Hearing/Vision screen Hearing Screening - Comments:: Pt stated slight hearing loss  Vision Screening - Comments:: Pt follows up with Dr Maudie Mercury for annual eye exams   Dietary issues and exercise activities discussed: Current Exercise Habits: Home exercise routine, Type of exercise: walking, Time (Minutes): 30, Frequency (Times/Week): 2, Weekly Exercise (Minutes/Week): 60   Goals Addressed             This Visit's Progress    Patient Stated       Get some of this weight off        Depression Screen    05/19/2022    8:10 AM 02/17/2022    7:59 AM 06/14/2021    9:06 AM 12/20/2020    9:23 AM 06/11/2020    9:45 AM 08/26/2019    8:52 AM 12/05/2018    1:26 PM  PHQ 2/9 Scores  PHQ - 2 Score 0 0 0 0 0 0 0  PHQ- 9 Score  0  '2 2  6    '$ Fall Risk    05/15/2022    9:20 AM 06/14/2021    9:06 AM 12/20/2020    8:53 AM 08/26/2019    8:52 AM 10/03/2018    7:28 AM  Fall Risk   Falls in the  past year? 0 0 0 0 0  Number falls in past yr: 0 0 0 0   Injury with Fall? 0 0 0 0   Risk for fall due to : Impaired vision  No Fall Risks Orthopedic patient   Follow up Falls prevention discussed   Falls evaluation completed;Education provided;Falls prevention discussed     FALL RISK PREVENTION PERTAINING TO THE HOME:  Any stairs in or around the home? Yes  If so, are there any without handrails? No  Home free of loose throw rugs in walkways, pet beds, electrical cords, etc? Yes  Adequate lighting in your home to reduce risk of falls? Yes   ASSISTIVE DEVICES UTILIZED TO PREVENT FALLS:  Life alert? Yes  Use of a cane, walker or w/c? No  Grab bars in the bathroom? No  Shower chair or bench in shower? Yes  Elevated toilet seat or a handicapped toilet? No   TIMED UP AND GO:  Was the test performed? Yes .  Length of time to ambulate 10 feet: 10 sec.   Gait steady and fast without use of assistive device  Cognitive Function:        05/19/2022    8:14 AM 08/26/2019    8:54 AM  6CIT Screen  What Year? 0 points 0 points  What month? 0 points 0 points  What time? 0 points 0 points  Count back from 20 0 points 0 points  Months in reverse 0 points 0 points  Repeat phrase 2 points 0 points  Total Score 2 points 0 points    Immunizations Immunization History  Administered Date(s) Administered   Influenza-Unspecified 04/02/2018    TDAP status: Due, Education has been provided regarding the importance of this vaccine. Advised may receive this vaccine at local pharmacy or Health Dept. Aware to provide a copy of the vaccination record if obtained from local pharmacy or Health Dept. Verbalized acceptance and understanding.  Flu Vaccine status: Declined, Education has been provided regarding the importance of this vaccine but patient still declined. Advised may receive this vaccine at local pharmacy or Health Dept. Aware to provide a copy of the vaccination record if obtained from  local pharmacy or Health Dept. Verbalized acceptance and understanding.  Pneumococcal vaccine status: Declined,  Education has been provided regarding the importance of this vaccine but patient still declined. Advised may receive this vaccine at local pharmacy or Health Dept. Aware to provide a copy of the vaccination record if obtained from local pharmacy or Health Dept. Verbalized acceptance and understanding.   Covid-19 vaccine status: Declined, Education has been provided regarding the importance of this vaccine but patient still declined. Advised may receive this vaccine at local pharmacy or Health Dept.or vaccine clinic. Aware to provide a copy of the vaccination record if obtained from local pharmacy or Health Dept. Verbalized acceptance and understanding.  Qualifies for Shingles Vaccine? Yes   Zostavax completed No   Shingrix Completed?: No.    Education has been provided regarding the importance of this vaccine. Patient has been advised to call insurance company to determine out of pocket expense if they have not yet received this vaccine. Advised may also receive vaccine at local pharmacy or Health Dept. Verbalized acceptance and understanding.  Screening Tests Health Maintenance  Topic Date Due   DTaP/Tdap/Td (1 - Tdap) Never done   INFLUENZA VACCINE  07/31/2022 (Originally 11/29/2021)   Medicare Annual Wellness (AWV)  05/20/2023   COLONOSCOPY (Pts 45-12yr Insurance coverage will need to be confirmed)  05/01/2026   Hepatitis C Screening  Completed   HPV VACCINES  Aged Out   Pneumonia Vaccine 72 Years old  Discontinued   COVID-19 Vaccine  Discontinued   Zoster Vaccines- Shingrix  Discontinued    Health Maintenance  Health Maintenance Due  Topic Date Due   DTaP/Tdap/Td (1 - Tdap) Never done    Colorectal cancer screening: Type of screening: Colonoscopy. Completed 05/01/16. Repeat every 10 years  Additional Screening:  Hepatitis C Screening:  Completed 06/11/19  Vision  Screening: Recommended annual ophthalmology exams for early detection of glaucoma and other disorders of the eye. Is the patient up to date with their annual eye exam?  Yes  Who is the provider or what is the name of the office in which the patient attends annual eye exams? Dr KMaudie MercuryIf pt is not established with a provider, would they like to be referred to a provider to establish care? No .   Dental Screening: Recommended annual dental exams for proper oral hygiene  Community Resource Referral / Chronic Care Management: CRR required this visit?  No   CCM required this visit?  No      Plan:     I have personally reviewed and noted the following in the patient's chart:  Medical and social history Use of alcohol, tobacco or illicit drugs  Current medications and supplements including opioid prescriptions. Patient is not currently taking opioid prescriptions. Functional ability and status Nutritional status Physical activity Advanced directives List of other physicians Hospitalizations, surgeries, and ER visits in previous 12 months Vitals Screenings to include cognitive, depression, and falls Referrals and appointments  In addition, I have reviewed and discussed with patient certain preventive protocols, quality metrics, and best practice recommendations. A written personalized care plan for preventive services as well as general preventive health recommendations were provided to patient.     Willette Brace, LPN   11/16/5972   Nurse Notes: none

## 2022-05-26 ENCOUNTER — Encounter: Payer: Self-pay | Admitting: Internal Medicine

## 2022-05-26 ENCOUNTER — Ambulatory Visit: Payer: Medicare Other | Admitting: Internal Medicine

## 2022-05-26 VITALS — BP 124/69 | HR 60 | Temp 97.7°F | Ht 75.0 in | Wt 316.0 lb

## 2022-05-26 DIAGNOSIS — M159 Polyosteoarthritis, unspecified: Secondary | ICD-10-CM | POA: Diagnosis not present

## 2022-05-26 DIAGNOSIS — Z9181 History of falling: Secondary | ICD-10-CM | POA: Insufficient documentation

## 2022-05-26 DIAGNOSIS — I5022 Chronic systolic (congestive) heart failure: Secondary | ICD-10-CM

## 2022-05-26 DIAGNOSIS — R319 Hematuria, unspecified: Secondary | ICD-10-CM | POA: Insufficient documentation

## 2022-05-26 DIAGNOSIS — G4700 Insomnia, unspecified: Secondary | ICD-10-CM

## 2022-05-26 DIAGNOSIS — Z8739 Personal history of other diseases of the musculoskeletal system and connective tissue: Secondary | ICD-10-CM | POA: Insufficient documentation

## 2022-05-26 DIAGNOSIS — Z7901 Long term (current) use of anticoagulants: Secondary | ICD-10-CM

## 2022-05-26 DIAGNOSIS — K219 Gastro-esophageal reflux disease without esophagitis: Secondary | ICD-10-CM

## 2022-05-26 DIAGNOSIS — I4819 Other persistent atrial fibrillation: Secondary | ICD-10-CM

## 2022-05-26 DIAGNOSIS — E785 Hyperlipidemia, unspecified: Secondary | ICD-10-CM | POA: Diagnosis not present

## 2022-05-26 DIAGNOSIS — M109 Gout, unspecified: Secondary | ICD-10-CM

## 2022-05-26 DIAGNOSIS — R638 Other symptoms and signs concerning food and fluid intake: Secondary | ICD-10-CM | POA: Insufficient documentation

## 2022-05-26 DIAGNOSIS — G4730 Sleep apnea, unspecified: Secondary | ICD-10-CM

## 2022-05-26 DIAGNOSIS — K220 Achalasia of cardia: Secondary | ICD-10-CM

## 2022-05-26 DIAGNOSIS — J309 Allergic rhinitis, unspecified: Secondary | ICD-10-CM | POA: Insufficient documentation

## 2022-05-26 DIAGNOSIS — I10 Essential (primary) hypertension: Secondary | ICD-10-CM | POA: Insufficient documentation

## 2022-05-26 DIAGNOSIS — I429 Cardiomyopathy, unspecified: Secondary | ICD-10-CM

## 2022-05-26 MED ORDER — OZEMPIC (0.25 OR 0.5 MG/DOSE) 2 MG/3ML ~~LOC~~ SOPN: 0.2500 mg | PEN_INJECTOR | SUBCUTANEOUS | 3 refills | Status: DC

## 2022-05-26 MED ORDER — DICLOFENAC SODIUM 1 % EX GEL: 4.0000 g | Freq: Four times a day (QID) | CUTANEOUS | 3 refills | Status: DC | PRN

## 2022-05-26 NOTE — Progress Notes (Unsigned)
[facility]: [phone]   Routine Medical Office Visit  Patient:  Isaiah Jones      Age: 72 y.o.       Sex:  male  Date:   05/27/2022  PCP:    Isaiah Jones, [place] Provider: Loralee Pacas, MD   Problem Focused Charting:   Medical Decision Making per Assessment/Plan   Isaiah Jones was seen today for 3 month follow-up.  Osteoarthritis of multiple joints, unspecified osteoarthritis type Overview: He has mixed osteoarthritis and gouty arthritis they osteoarthritis is mainly in the knees  Assessment & Plan:  I offered injections and physical therapy but he would just use bike for now Did encourage Voltaren Avoid NSAIDs due to Direct acting Oral AntiCoagulant (DOAC) gastrointestinal bleeding risks  Orders: -     Diclofenac Sodium; Apply 4 g topically 4 (four) times daily as needed.  Dispense: 100 g; Refill: 3  Chronic HFrEF (heart failure with reduced ejection fraction) ([place]) Overview:  Per cardiologist 05/2022 follow up : HFrEF/Cardiomyopathy: Echo 04/17/22 revealed newly reduced LVEF 30 to 35% (normal in 2018), global hypokinesis, mild LVH. He was having DOE that he felt was stable, secondary to weight. Has continued to exercise at same pace. No orthopnea, PND, or edema. Reports weight is stable. Continue to limit sodium to < 2000 mg, limit fluids to 2 L daily.  Tolerating Entresto with out adverse side effects. He is feeling well. Lengthy discussion about GDMT for CHF. Would like to try generic medication first. Will add spironolactone 12.5 mg daily. Will get BMP on 06/02/22.      Orders: -     Ozempic (0.25 or 0.5 MG/DOSE); Inject 0.25 mg into the skin once a week.  Dispense: 3 mL; Refill: 3  Anticoagulant long-term use Overview: For atrial fibrillation Also history of hypertensive heart disease with heart failure  xarelto   Dyslipidemia Overview: Lipid Panel     Component Value Date/Time   CHOL 148 03/13/2022 0853   CHOL 133  02/18/2019 0836   TRIG 154.0 (H) 03/13/2022 0853   HDL 48.70 03/13/2022 0853   HDL 40 02/18/2019 0836   CHOLHDL 3 03/13/2022 0853   VLDL 30.8 03/13/2022 0853   LDLCALC 68 03/13/2022 0853   LDLCALC 63 02/18/2019 0836   LDLDIRECT 76.0 06/11/2020 0938   LABVLDL 30 02/18/2019 0836     Cardiomyopathy, unspecified type ([place]) -     Ozempic (0.25 or 0.5 MG/DOSE); Inject 0.25 mg into the skin once a week.  Dispense: 3 mL; Refill: 3  Gout, unspecified cause, unspecified chronicity, unspecified site Overview: Frequent attacks of gout for many years  Trying to avoid pills, so not taking prescribed allopurinol Avoiding beer now.   Morbid obesity ([place]) Assessment & Plan: I have had an extensive 10 minute conversation today with the patient about healthy eating habits, exercise, calorie and carb goals for sustainable and successful weight loss. I gave the patient caloric and protein daily intake values as well as described the importance of increasing fiber and water intake. I discussed weight loss medications that could be used in the treatment of this patient. Handouts on low carb eating were given to the patient.    Sending in [place] for coronary artery disease, heart failure with reduced ejection fraction, cardiomyopathy, and morbid obesity He is riding recumbent, encouraged continuing with that He is holding off on allopurinol due to diet- although I did encourage continue with he'd like to limit medications   Orders: -  Ozempic (0.25 or 0.5 MG/DOSE); Inject 0.25 mg into the skin once a week.  Dispense: 3 mL; Refill: 3  Insomnia with sleep apnea Overview: Generally well-controlled  Encouraged continuing with CPAP and yearly sleep specialist follow up   Assessment & Plan: I still think your oxygen CPAP machine is not keeping your oxygen level up at night and him oxygen might be dropping when you are exercising and I think this is causing some issues with your heart.  I want you to  get an oxygen sensor for your fingertip that can continuously monitor and record your oxygen so that you can see how it does overnight as well as when you are exercising these are available on [place] for around 40 or 50 bucks.   Laryngopharyngeal reflux (LPR) Overview: Associated with achalasia planned for poem.   Persistent atrial fibrillation [facility]) Overview: 05/2022 cardiology evaluation: Persistent atrial fibrillation on chronic anticoagulation: Clinically appears to be in atrial fibrillation.HR is  well controlled. No frequent palpitations or concerns for tachycardia. Overall, not very symptomatic. Continue rate control strategy with Toprol XL. No bleeding concerns. Continue Xarelto 20 mg daily, which is appropriate dose for Scr, for stroke prevention.   Assessment & Plan: Reviewed and updated cardiologist plan to overview Well managed by cardiology and patient satisfied.   Achalasia of esophagus Overview: Per gastrointestinal specialist evaluation with manometry: Isaiah Barker, MD Notified the patient about the diagnosis of type 2 achalasia based upon his recent esophageal manometry. Went over the options for treatment and patient would like to be considered for definitive therapy. Will refer to [facility] for consideration of surgical myotomy or POEM - at [facility] he was felt to be appropriate- and it was planned for 06/2022.  Assessment & Plan: Discussed risks and benefits poem procedure that is planned. Encouraged continuing with the planned myotomy        Subjective - Clinical Presentation:   Isaiah Jones is a 72 y.o. male  Patient Active Problem List   Diagnosis Date Noted   Insomnia with sleep apnea 05/26/2022   History of arthritis 05/26/2022   Blood in urine 05/26/2022   Allergic rhinitis 05/26/2022   Morbid obesity ([place]) 05/26/2022   Hypertension 05/26/2022   At risk for falls 05/26/2022   Chronic HFrEF (heart failure with reduced ejection fraction) ([place]) 04/20/2022    Achalasia of esophagus 03/30/2022   LBBB (left bundle branch block) 03/29/2022   Hypertensive heart disease with heart failure ([place]) 03/29/2022   Anticoagulant long-term use 03/29/2022   Hypertriglyceridemia 03/14/2022   Dysphagia 02/17/2022   Hyperlipidemia 02/17/2022   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   Laryngopharyngeal reflux (LPR) 10/11/2021   Chronic cough 02/12/2020   Former smoker 12/10/2019   Primary localized osteoarthritis of right hip 10/21/2019   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   Vitamin D deficiency 06/16/2019   Personal history of gout 05/23/2019   Erectile dysfunction 12/05/2018   Essential hypertension 12/05/2018   Gout 12/05/2018   Osteoarthritis 12/05/2018   Seasonal allergies 12/05/2018   Depression, major, 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 12/05/2018   BMI 45.0-49.9, adult ([place]) 03/09/2018   Foot pain, right 03/09/2018   Persistent atrial fibrillation (HCC)    OSA on CPAP    Dyslipidemia    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    CHF (congestive heart failure) (HCC)    Colon polyps  Complication of anesthesia    Depression    Dyslipidemia    Dysrhythmia 2017   A fib   GERD (gastroesophageal reflux disease)    Glaucoma    early stage   History of 2019 novel coronavirus disease (COVID-19) 06/2019   History of kidney stones    Hypertension    Hypertriglyceridemia 03/14/2022   Lab Results Component Value Date/Time  TRIG 154.0 (H) 03/13/2022 08:53 AM  TRIG 188.0 (H) 06/14/2021 09:38 AM  TRIG 203.0 (H) 06/11/2020 09:38 AM  TRIG 166.0 (H) 06/11/2019 09:44 AM  TRIG 177 (H) 02/18/2019 08:36 AM     OSA on CPAP    C-Pap   Persistent atrial fibrillation ([place])    Echo 3/18: Mild LVH, EF 55-60, normal wall motion, grade 1 diastolic dysfunction, mild LAE   Pneumonia    childhood   PONV (postoperative nausea and  vomiting)    after 1st knee surgery only   Rhinitis, allergic     Outpatient Medications Prior to Visit  Medication Sig   acetaminophen (TYLENOL) 500 MG tablet Take 500 mg by mouth every 6 (six) hours as needed (for pain.).   allopurinol (ZYLOPRIM) 100 MG tablet Take 1 tablet (100 mg total) by mouth daily. Dont start until steroids haved calmed down the attack, but do not stop if an attack returns.   amoxicillin (AMOXIL) 250 MG capsule Take 500 mg by mouth 3 (three) times daily.   Calcium Carb-Cholecalciferol (CALCIUM-VITAMIN D) 600-400 MG-UNIT TABS Take 1 tablet by mouth in the morning, at noon, and at bedtime.   Calcium Carbonate-Vit D-Min (CALCIUM 600+D PLUS MINERALS) 600-400 MG-UNIT TABS Take 1 each by mouth daily as needed.   diphenhydramine-acetaminophen (TYLENOL PM) 25-500 MG TABS tablet Take 2 tablets by mouth at bedtime.    fexofenadine (ALLEGRA) 180 MG tablet Take 180 mg by mouth daily as needed for allergies or rhinitis.   Ginkgo Biloba 60 MG TABS Take 60 mg by mouth daily.   latanoprost (XALATAN) 0.005 % ophthalmic solution Place 1 drop into both eyes at bedtime.    methocarbamol (ROBAXIN) 750 MG tablet Take 1 tablet (750 mg total) by mouth every 6 (six) hours as needed for muscle spasms.   metoprolol succinate (TOPROL-XL) 25 MG 24 hr tablet TAKE 1 AND 1/2 TABLETS     DAILY WITH OR IMMEDIATELY  FOLLOWING A MEAL   mometasone (NASONEX) 50 MCG/ACT nasal spray Place 1 spray into the nose at bedtime.   Multiple Vitamin (MULTIVITAMIN WITH MINERALS) TABS tablet Take 2 tablets by mouth daily. Bariatric Multivitamin   Multiple Vitamins-Minerals (AIRBORNE PO) Take 1 tablet by mouth daily as needed (immune health).    rivaroxaban (XARELTO) 20 MG TABS tablet TAKE 1 TABLET DAILY WITH   SUPPER   rosuvastatin (CRESTOR) 20 MG tablet TAKE 1 TABLET DAILY   sacubitril-valsartan (ENTRESTO) 49-51 MG Take 1 tablet by mouth 2 (two) times daily.   sertraline (ZOLOFT) 50 MG tablet TAKE 1 TABLET DAILY    spironolactone (ALDACTONE) 25 MG tablet Take 0.5 tablets (12.5 mg total) by mouth daily.   Thiamine HCl (VITAMIN B1 PO) Take by mouth.   [DISCONTINUED] Semaglutide-Weight Management 1.7 MG/0.75ML SOAJ Inject 1.7 mg into the skin once a week for 28 days.   [DISCONTINUED] Semaglutide-Weight Management 2.4 MG/0.75ML SOAJ Inject 2.4 mg into the skin once a week for 28 days.   No facility-administered medications prior to visit.    Chief Complaint  Patient presents with   3 month follow-up    HPI  Objective:  Physical Exam  BP 124/69 (BP Location: Left Arm, Patient Position: Sitting)   Pulse 60   Temp 97.7 F (36.5 C) (Temporal)   Ht '6\' 3"'$  (1.905 m)   Wt (!) 316 lb (143.3 kg)   SpO2 96%   BMI 39.50 kg/m  Obese  by BMI criteria but truncal adiposity (waist circumference or caliper) should be used instead. Wt Readings from Last 10 Encounters:  05/26/22 (!) 316 lb (143.3 kg)  05/19/22 (!) 315 lb 3.2 oz (143 kg)  05/19/22 (!) 318 lb 9.6 oz (144.5 kg)  04/19/22 (!) 317 lb 6.4 oz (144 kg)  03/27/22 (!) 311 lb 3.2 oz (141.2 kg)  02/17/22 (!) 308 lb 12.8 oz (140.1 kg)  01/05/22 300 lb (136.1 kg)  11/24/21 (!) 309 lb (140.2 kg)  07/25/21 (!) 305 lb (138.3 kg)  06/14/21 (!) 306 lb 12.8 oz (139.2 kg)   Vital signs reviewed.  Nursing notes reviewed. Weight trend reviewed. General Appearance:  Well developed, well nourished male in no acute distress.   Normal work of breathing at rest Musculoskeletal: All extremities are intact.  Neurological:  Awake, alert,  No obvious focal neurological deficits or cognitive impairments Psychiatric:  Appropriate mood, pleasant demeanor Problem-specific findings:  truncal adiposity    Results Reviewed: No results found for any visits on 05/26/22.  Recent Results (from the past 2160 hour(s))  PSA     Status: None   Collection Time: 03/13/22  8:53 AM  Result Value Ref Range   PSA 0.13 0.10 - 4.00 ng/mL    Comment: Test performed using  Access Hybritech PSA Assay, a parmagnetic partical, chemiluminecent immunoassay.  VITAMIN D 25 Hydroxy (Vit-D Deficiency, Fractures)     Status: None   Collection Time: 03/13/22  8:53 AM  Result Value Ref Range   VITD 37.40 30.00 - 100.00 ng/mL  TSH     Status: None   Collection Time: 03/13/22  8:53 AM  Result Value Ref Range   TSH 1.36 0.35 - 5.50 uIU/mL  Lipid panel     Status: Abnormal   Collection Time: 03/13/22  8:53 AM  Result Value Ref Range   Cholesterol 148 0 - 200 mg/dL    Comment: ATP III Classification       Desirable:  < 200 mg/dL               Borderline High:  200 - 239 mg/dL          High:  > = 240 mg/dL   Triglycerides 154.0 (H) 0.0 - 149.0 mg/dL    Comment: Normal:  <150 mg/dLBorderline High:  150 - 199 mg/dL   HDL 48.70 >39.00 mg/dL   VLDL 30.8 0.0 - 40.0 mg/dL   LDL Cholesterol 68 0 - 99 mg/dL   Total CHOL/HDL Ratio 3     Comment:                Men          Women1/2 Average Risk     3.4          3.3Average Risk          5.0          4.42X Average Risk          9.6          7.13X Average Risk          15.0          11.0  NonHDL 98.80     Comment: NOTE:  Non-HDL goal should be 30 mg/dL higher than patient's LDL goal (i.e. LDL goal of < 70 mg/dL, would have non-HDL goal of < 100 mg/dL)  Comprehensive metabolic panel     Status: None   Collection Time: 03/13/22  8:53 AM  Result Value Ref Range   Sodium 139 135 - 145 mEq/L   Potassium 4.5 3.5 - 5.1 mEq/L   Chloride 101 96 - 112 mEq/L   CO2 32 19 - 32 mEq/L   Glucose, Bld 93 70 - 99 mg/dL   BUN 15 6 - 23 mg/dL   Creatinine, Ser 0.92 0.40 - 1.50 mg/dL   Total Bilirubin 0.7 0.2 - 1.2 mg/dL   Alkaline Phosphatase 58 39 - 117 U/L   AST 26 0 - 37 U/L   ALT 21 0 - 53 U/L   Total Protein 6.8 6.0 - 8.3 g/dL   Albumin 4.4 3.5 - 5.2 g/dL   GFR 83.10 >60.00 mL/min    Comment: Calculated using the CKD-EPI Creatinine Equation (2021)   Calcium 10.0 8.4 - 10.5 mg/dL  CBC     Status: None   Collection  Time: 03/13/22  8:53 AM  Result Value Ref Range   WBC 5.3 4.0 - 10.5 K/uL   RBC 5.13 4.22 - 5.81 Mil/uL   Platelets 174.0 150.0 - 400.0 K/uL   Hemoglobin 16.7 13.0 - 17.0 g/dL   HCT 49.6 39.0 - 52.0 %   MCV 96.7 78.0 - 100.0 fl   MCHC 33.7 30.0 - 36.0 g/dL   RDW 13.9 11.5 - 15.5 %  Methylmalonic acid, serum     Status: None   Collection Time: 03/13/22  8:54 AM  Result Value Ref Range   Methylmalonic Acid, Quant 168 87 - 318 nmol/L    Comment: . This test was developed and its analytical performance characteristics have been determined by [facility], [place]. It has not been cleared or approved by the U.S. Food and Drug Administration. This assay has been validated pursuant to the CLIA regulations and is used for clinical purposes. Marland Kitchen   ECHOCARDIOGRAM COMPLETE     Status: None   Collection Time: 04/17/22  8:17 AM  Result Value Ref Range   S' Lateral 4.10 cm     Follow up 1 week after upcoming poem procedure     Signed: Loralee Pacas, MD 05/27/2022 9:52 AM

## 2022-05-26 NOTE — Assessment & Plan Note (Signed)
I have had an extensive 10 minute conversation today with the patient about healthy eating habits, exercise, calorie and carb goals for sustainable and successful weight loss. I gave the patient caloric and protein daily intake values as well as described the importance of increasing fiber and water intake. I discussed weight loss medications that could be used in the treatment of this patient. Handouts on low carb eating were given to the patient.    Sending in [place] for coronary artery disease, heart failure with reduced ejection fraction, cardiomyopathy, and morbid obesity He is riding recumbent, encouraged continuing with that He is holding off on allopurinol due to diet- although I did encourage continue with he'd like to limit medications

## 2022-05-26 NOTE — Patient Instructions (Addendum)
I still think your oxygen CPAP machine is not keeping your oxygen level up at night and him oxygen might be dropping when you are exercising and I think this is causing some issues with your heart.  I want you to get an oxygen sensor for your fingertip that can continuously monitor and record your oxygen so that you can see how it does overnight as well as when you are exercising these are available on [place] for around 40 or 50 bucks. I am okay with you not taking the allopurinol but you will need to stay away from the beer and watch her diet so that it is not good to have a lot of purines.  Try to get this Ozempic because it protects your heart make sure they know that at the pharmacy and then I am willing to do a prior Auth and I like you to get started on it as soon as she can but not the day before your surgery.  I do encourage you to lose weight because the more weight you lose prior to the surgery to more positive your outcomes are expected to be.  At the Voltaren for your hands I think you will find it really helpful for your arthritis pain. I am looking forward to seeing you back maybe a week after your upcoming poem procedure which I think is going to be safe effective and helpful at least for a little while

## 2022-05-27 MED ORDER — OZEMPIC (0.25 OR 0.5 MG/DOSE) 2 MG/3ML ~~LOC~~ SOPN: 0.2500 mg | PEN_INJECTOR | SUBCUTANEOUS | 3 refills | Status: DC

## 2022-05-27 NOTE — Assessment & Plan Note (Signed)
Reviewed and updated cardiologist plan to overview Well managed by cardiology and patient satisfied.

## 2022-05-27 NOTE — Assessment & Plan Note (Signed)
I offered injections and physical therapy but he would just use bike for now Did encourage Voltaren Avoid NSAIDs due to Direct acting Oral AntiCoagulant (DOAC) gastrointestinal bleeding risks

## 2022-05-27 NOTE — Assessment & Plan Note (Signed)
I still think your oxygen CPAP machine is not keeping your oxygen level up at night and him oxygen might be dropping when you are exercising and I think this is causing some issues with your heart.  I want you to get an oxygen sensor for your fingertip that can continuously monitor and record your oxygen so that you can see how it does overnight as well as when you are exercising these are available on [place] for around 40 or 50 bucks.

## 2022-05-27 NOTE — Assessment & Plan Note (Signed)
Discussed risks and benefits poem procedure that is planned. Encouraged continuing with the planned myotomy

## 2022-05-29 ENCOUNTER — Other Ambulatory Visit: Payer: Self-pay | Admitting: Interventional Cardiology

## 2022-05-29 DIAGNOSIS — I4819 Other persistent atrial fibrillation: Secondary | ICD-10-CM

## 2022-05-30 ENCOUNTER — Telehealth: Payer: Self-pay | Admitting: Internal Medicine

## 2022-05-30 NOTE — Telephone Encounter (Signed)
Prescription refill request for Xarelto received.  Indication:afib Last office visit:1/24 Weight:143.3  kg Age:72 Scr:1/24  1.0 CrCl:135.34  ml/min  Prescription refilled

## 2022-05-30 NOTE — Telephone Encounter (Signed)
Patient states he is unable to get RX for Ozempic that was sent [facility] because  they are out of stock.  States insurance requires PA for [employer]. Requests RX for Ozempic be sent to:  [facility] in [place], [place] # [phone].

## 2022-06-01 ENCOUNTER — Other Ambulatory Visit: Payer: Self-pay

## 2022-06-01 DIAGNOSIS — I429 Cardiomyopathy, unspecified: Secondary | ICD-10-CM

## 2022-06-01 DIAGNOSIS — I5022 Chronic systolic (congestive) heart failure: Secondary | ICD-10-CM

## 2022-06-01 MED ORDER — OZEMPIC (0.25 OR 0.5 MG/DOSE) 2 MG/3ML ~~LOC~~ SOPN: 0.2500 mg | PEN_INJECTOR | SUBCUTANEOUS | 3 refills | Status: DC

## 2022-06-01 NOTE — Telephone Encounter (Signed)
Spoke with patient and informed him that I sent the Ozempic to the pharmacy requested and would complete the PA myself. Completed the PA today.

## 2022-06-02 ENCOUNTER — Other Ambulatory Visit: Payer: Self-pay | Admitting: *Deleted

## 2022-06-02 ENCOUNTER — Ambulatory Visit: Payer: Medicare Other | Attending: Nurse Practitioner

## 2022-06-02 DIAGNOSIS — I4819 Other persistent atrial fibrillation: Secondary | ICD-10-CM

## 2022-06-02 DIAGNOSIS — I5022 Chronic systolic (congestive) heart failure: Secondary | ICD-10-CM

## 2022-06-02 DIAGNOSIS — I447 Left bundle-branch block, unspecified: Secondary | ICD-10-CM

## 2022-06-03 LAB — BASIC METABOLIC PANEL
BUN/Creatinine Ratio: 18 (ref 10–24)
BUN: 17 mg/dL (ref 8–27)
CO2: 23 mmol/L (ref 20–29)
Calcium: 9.8 mg/dL (ref 8.6–10.2)
Chloride: 102 mmol/L (ref 96–106)
Creatinine, Ser: 0.92 mg/dL (ref 0.76–1.27)
Glucose: 90 mg/dL (ref 70–99)
Potassium: 4.4 mmol/L (ref 3.5–5.2)
Sodium: 141 mmol/L (ref 134–144)
eGFR: 88 mL/min/{1.73_m2} (ref >59)

## 2022-06-03 LAB — SPECIMEN STATUS REPORT

## 2022-06-09 ENCOUNTER — Telehealth: Payer: Self-pay | Admitting: Interventional Cardiology

## 2022-06-09 NOTE — Telephone Encounter (Signed)
I spoke with patient. He reports he was discharged today from [facility] after having surgery.  Reports during hospitalization he was in afib most of the time.  He is not aware of afib when he is in it.  He reports he was told notes would be sent to our office for review and patient wanted to make sure office knew to look out for them.  I told patient I was able to view some of the notes in his chart.  Toprol was held due to low heart rate.  Patient aware to continue to hold this and to monitor BP and heart rate at home.  I told him message would be sent to [facility] and we would call him back with any recommendations and to get BP/heart rate readings.

## 2022-06-09 NOTE — Telephone Encounter (Signed)
Patient states he just left [facility] and they are sending a report that includes medication updates. He is also requesting a call back from [name], NP, to discuss if at all possible.

## 2022-06-12 ENCOUNTER — Telehealth: Payer: Self-pay

## 2022-06-12 NOTE — Transitions of Care (Post Inpatient/ED Visit) (Signed)
   06/12/2022  Name: Isaiah Jones MRN: [id number] DOB: [birth year]  Today's TOC FU Call Status: Today's TOC FU Call Status:: Successful TOC FU Call Competed TOC FU Call Complete Date: 06/12/22  Transition Care Management Follow-up Telephone Call Date of Discharge: 06/09/22 Discharge Facility: Other Facility Name of Other Discharge Facility: [facility] Type of Discharge: Inpatient Admission Primary Inpatient Discharge Diagnosis:: achalasia of esophagus How have you been since you were released from the hospital?: Same Any questions or concerns?: Yes Patient Questions/Concerns:: I am concerned that I am staying in atrial fib but I am not having any symptoms Patient Questions/Concerns Addressed: Other: (pt has already notified cardiology)  Items Reviewed: Did you receive and understand the discharge instructions provided?: Yes Medications obtained and verified?: Yes (Medications Reviewed) Any new allergies since your discharge?: No Dietary orders reviewed?: Yes Type of Diet Ordered:: clear liquid advancing to soft Do you have support at home?: Yes People in Home: spouse Name of Support/Comfort Primary Source: [place] and Equipment/Supplies: [place] Ordered?: NA Any new equipment or medical supplies ordered?: NA  Functional Questionnaire: Do you need assistance with bathing/showering or dressing?: No Do you need assistance with meal preparation?: No Do you need assistance with eating?: No Do you have difficulty maintaining continence: No Do you need assistance with getting out of bed/getting out of a chair/moving?: No Do you have difficulty managing or taking your medications?: No  Folllow up appointments reviewed: PCP Follow-up appointment confirmed?: Yes Date of PCP follow-up appointment?: 06/16/22 Follow-up Provider: Dr. Randol Kern [facility] Follow-up appointment confirmed?: Yes Date of Specialist follow-up appointment?:  07/04/22 Follow-Up Specialty Provider:: [facility] Do you need transportation to your follow-up appointment?: No Do you understand care options if your condition(s) worsen?: Yes-patient verbalized understanding   Interventions Today    Flowsheet Row Most Recent Value  Chronic Disease Discussed/Reviewed   Chronic disease discussed/reviewed during today's visit Atrial Fibrillation (AFib)  General Interventions   General Interventions Discussed/Reviewed Doctor Visits  Doctor Visits Discussed/Reviewed Specialist  [Encouraged to discuss with cardiology about going to Atrial Fib clinic]        Peter Garter RN, BSN,CCM, [place] Management Coordinator [facility] [phone]

## 2022-06-12 NOTE — Transitions of Care (Post Inpatient/ED Visit) (Signed)
   06/12/2022  Name: CHEVY SWEIGERT MRN: [id number] DOB: 01/02/1951  Today's TOC FU Call Status: Today's TOC FU Call Status:: Successful TOC FU Call Competed TOC FU Call Complete Date: 06/12/22  Transition Care Management Follow-up Telephone Call Date of Discharge: 06/09/22 Discharge Facility: Other Facility Name of Other Discharge Facility: [facility] Type of Discharge: Inpatient Admission Primary Inpatient Discharge Diagnosis:: achalasia of cardia How have you been since you were released from the hospital?: Better Any questions or concerns?: No  Items Reviewed: Did you receive and understand the discharge instructions provided?: No Medications obtained and verified?: Yes (Medications Reviewed) Any new allergies since your discharge?: No Dietary orders reviewed?: No Do you have support at home?: Yes  Home Care and Equipment/Supplies: [place] Ordered?: NA Any new equipment or medical supplies ordered?: NA  Functional Questionnaire: Do you need assistance with bathing/showering or dressing?: No Do you need assistance with meal preparation?: No Do you need assistance with eating?: No Do you have difficulty maintaining continence: No Do you need assistance with getting out of bed/getting out of a chair/moving?: No Do you have difficulty managing or taking your medications?: No  Folllow up appointments reviewed: PCP Follow-up appointment confirmed?: Yes Date of PCP follow-up appointment?: 06/16/22 Follow-up Provider: Dr Randol Kern [facility] Follow-up appointment confirmed?: Yes Date of Specialist follow-up appointment?: 07/04/22 Follow-Up Specialty Provider:: Dr Maryjane Hurter Do you need transportation to your follow-up appointment?: No Do you understand care options if your condition(s) worsen?: Yes-patient verbalized understanding    [place] LPN [place] [phone]

## 2022-06-13 ENCOUNTER — Encounter: Payer: Self-pay | Admitting: Nurse Practitioner

## 2022-06-13 ENCOUNTER — Ambulatory Visit: Payer: Medicare Other | Attending: Nurse Practitioner | Admitting: Nurse Practitioner

## 2022-06-13 VITALS — BP 112/68 | HR 62 | Ht 75.0 in | Wt 313.0 lb

## 2022-06-13 DIAGNOSIS — Z7901 Long term (current) use of anticoagulants: Secondary | ICD-10-CM

## 2022-06-13 DIAGNOSIS — I447 Left bundle-branch block, unspecified: Secondary | ICD-10-CM

## 2022-06-13 DIAGNOSIS — R001 Bradycardia, unspecified: Secondary | ICD-10-CM

## 2022-06-13 DIAGNOSIS — G4733 Obstructive sleep apnea (adult) (pediatric): Secondary | ICD-10-CM | POA: Diagnosis not present

## 2022-06-13 DIAGNOSIS — I4811 Longstanding persistent atrial fibrillation: Secondary | ICD-10-CM

## 2022-06-13 DIAGNOSIS — I429 Cardiomyopathy, unspecified: Secondary | ICD-10-CM | POA: Diagnosis not present

## 2022-06-13 DIAGNOSIS — I10 Essential (primary) hypertension: Secondary | ICD-10-CM

## 2022-06-13 DIAGNOSIS — K220 Achalasia of cardia: Secondary | ICD-10-CM

## 2022-06-13 DIAGNOSIS — I5022 Chronic systolic (congestive) heart failure: Secondary | ICD-10-CM | POA: Diagnosis not present

## 2022-06-13 MED ORDER — METOPROLOL SUCCINATE ER 25 MG PO TB24: 37.5000 mg | ORAL_TABLET | Freq: Every day | ORAL | 6 refills | Status: DC

## 2022-06-13 NOTE — Patient Instructions (Signed)
Medication Instructions:  RESUME Metoprolol 37.37m Take 1 once a day  *If you need a refill on your cardiac medications before your next appointment, please call your pharmacy*   Lab Work: None ordered   Testing/Procedures: None ordered   Follow-Up: [facility] you and your health needs are our priority.  As part of our continuing mission to provide you with exceptional heart care, we have created designated Provider Care Teams.  These Care Teams include your primary Cardiologist (physician) and Advanced Practice Providers (APPs -  Physician Assistants and Nurse Practitioners) who all work together to provide you with the care you need, when you need it.  We recommend signing up for the patient portal called "MyChart".  Sign up information is provided on this After Visit Summary.  MyChart is used to connect with patients for Virtual Visits (Telemedicine).  Patients are able to view lab/test results, encounter notes, upcoming appointments, etc.  Non-urgent messages can be sent to your provider as well.   To learn more about what you can do with MyChart, go to [email or web address]    Your next appointment:   FOLLOW UP AS SCHEDULED   Provider:   JLarae Grooms MD     Other Instructions

## 2022-06-13 NOTE — Telephone Encounter (Signed)
Called patient back about his message. Patient stated that [facility] told him to get in touch with his cardiologist ASAP. Made patient an appointment this afternoon to see [facility]. Asked patient about his HR and BP's.  Saturday AM 106/60   44                PM  130/58 46 Sunday   AM 121/68 47                PM 108/79  58 Monday  AM  119/67  48                PM 134/64  45  Tuesday AM 130/78   49   Patient is only taking aldactone 12.5 mg daily and Entresto 49-51 mg BID. Patient continues to hold his metoprolol, that his PCP discontinued it. Patient stated he wears a CPAP at night but has not seen pulmonology, who follows his sleep, in over 3 years. Patient was wanting to see someone in [place] if possible. Informed patient that he could discuss referral with Sharyn Lull when he sees her this afternoon.

## 2022-06-13 NOTE — Progress Notes (Signed)
Cardiology Office Note:    Date:  06/13/2022   ID:  Isaiah Jones, DOB 1950-08-20, MRN [id number]  PCP:  Loralee Pacas, MD   [facility] Providers Cardiologist:  Larae Grooms, MD     Referring MD: Loralee Pacas, MD   Chief Complaint: follow-up atrial fibrillation  History of Present Illness:    Isaiah Jones is a very pleasant 72 y.o. male with a hx of HTN, obesity, HLD, OSA on CPAP, and persistent atrial fibrillation.  Referred to cardiology by PCP for new onset atrial fibrillation and seen by Richardson Dopp, PA on 07/11/2016. Was noted to have an irregular heartbeat at his pulmonology follow-up.  EKG at PCP office confirmed atrial fibrillation. Had symptoms of fatigue and shortness of breath, also occasional palpitations. He was started on metoprolol and Xarelto and advised to work on weight loss. LV function at that time was 55-60%, mild LVH, G1DD. Unsuccessful cardioversion 07/2016, underwent weight loss surgery 06/2019. He has maintained consistent follow-up.  Last cardiology clinic visit was 07/25/2021 with Dr. Irish Lack. He continued to work on weight loss. Rate control strategy for a fib. No specific cardiac concerns. Recommendation to follow-up in 9 months.  Seen by me on 03/27/22 accompanied by his wife for follow-up. Reports he is dealing with swallowing issues but no cardiac concerns. Has shortness of breath with exertion that he feels is stable and improves when he exercises consistently. Walks or rides his bicycle for exercise but admits this is not consistent. Sleeping well, no concerns with CPAP. Occasional bilateral LE edema that resolves overnight. Feels his HR speed up when drinking coffee, otherwise no palpitations, or concerns for tachycardia. He denied chest pain, lower extremity edema, fatigue, melena, hematuria, hemoptysis, diaphoresis, weakness, presyncope, syncope, orthopnea, and PND.  He had a newly identified LBBB on EKG.  Echocardiogram was ordered which  revealed significant decrease in LVEF to 30 to 35%, global HK of LV, mild LVH, unable to evaluate diastolic function, RV SF moderately reduced, normal RV size. I advised him to return for an office visit to initiate GDMT for acute CHF.  Seen by me on 04/19/22 to discuss recent echo results from 04/17/22 which revealed LVEF reduced to 30 to 35% (normal in 2018), global hypokinesis, mild LVH. His wife accompanied him. Reports an additional recent finding on EGD that showed achalasia and has been referred to [facility]. C/o DOE that he felt was stable. Continuing to walk and ride stationary bike for exercise without change in activity tolerance. No orthopnea, edema, or PND. Has bendopnea. Questions about medical therapy answered to his satisfaction. Amlodipine was discontinued and he was advised to start Entresto 49-51 mg twice daily. CHF guidelines including daily weights, low-sodium diet, and fluid limitation to 2 L daily were discussed.   Lab work 05/05/22 revealed BNP 275, Scr 1.0.  Seen by me in cardiology clinic on 05/19/22 with his wife for follow-up. Reported started [facility] 04/21/22 and no s/e. Has occasional lightheadedness but no presyncope, syncope. Is scheduled for throat surgery on 06/07/22 at [facility], we reviewed his risks. Is riding his recumbent bike 30 minutes with increasing intensity, goal is to increase to 1 hour ride. Monitors HR and BP after riding for 30 minutes and then walking up 15 stairs to sit by his BP monitor. BP/HR when he gets to the chair are 120s over 70s, HR 55-60. While riding, HR usually 102-150 bpm. Says he feels a little winded after riding but makes the walk up the stairs  without any problem. No chest pain, palpitations, orthopnea, edema, or PND.   Today, he is here for follow-up following esophageal procedure at [facility]. Concerns about low heart rate, metoprolol was stopped.  He reports since procedure, he is feeling well and has less pain than he anticipated. Is on a liquid diet.  Cannot swallow pills, has to crush them.  Feels fatigued, but think this is 2/2 nutrition. He denies chest pain, shortness of breath, lower extremity edema, palpitations, melena, hematuria, hemoptysis, diaphoresis, weakness, presyncope, syncope, orthopnea, and PND. Has not resumed exercise but would like to do so. Cannot feel any change in HR since stopping metoprolol. Is concerned about lack of follow-up with his sleep medicine doctor, may request a referral to a new provider.   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    CHF (congestive heart failure) (HCC)    Colon polyps    Complication of anesthesia    Depression    Dyslipidemia    Dysrhythmia 2017   A fib   GERD (gastroesophageal reflux disease)    Glaucoma    early stage   History of 2019 novel coronavirus disease (COVID-19) 06/2019   History of kidney stones    Hypertension    Hypertriglyceridemia 03/14/2022   Lab Results Component Value Date/Time  TRIG 154.0 (H) 03/13/2022 08:53 AM  TRIG 188.0 (H) 06/14/2021 09:38 AM  TRIG 203.0 (H) 06/11/2020 09:38 AM  TRIG 166.0 (H) 06/11/2019 09:44 AM  TRIG 177 (H) 02/18/2019 08:36 AM     OSA on CPAP    C-Pap   Persistent atrial fibrillation ([place])    Echo 3/18: Mild LVH, EF 55-60, normal wall motion, grade 1 diastolic dysfunction, mild LAE   Pneumonia    childhood   PONV (postoperative nausea and vomiting)    after 1st knee surgery only   Rhinitis, allergic     Past Surgical History:  Procedure Laterality Date   arthroscopic knee surgery Bilateral 2003   CARDIOVERSION N/A 08/10/2016   Procedure: CARDIOVERSION;  Surgeon: Pixie Casino, MD;  Location: [place];  Service: Cardiovascular;  Laterality: N/A;   COLONOSCOPY     CYSTOSCOPY WITH RETROGRADE PYELOGRAM, URETEROSCOPY AND STENT PLACEMENT Left 03/04/2021   Procedure: CYSTOSCOPY WITH  BILATERAL RETROGRADES,PYELOGRAM, URETEROSCOPY AND STENT PLACEMENT;  Surgeon: Alexis Frock, MD;  Location: [facility];  Service: Urology;  Laterality: Left;  1 HR   ESOPHAGEAL MANOMETRY N/A 03/22/2022   Procedure: ESOPHAGEAL MANOMETRY (EM);  Surgeon: Sharyn Creamer, MD;  Location: [facility];  Service: Gastroenterology;  Laterality: N/A;   HOLMIUM LASER APPLICATION Left [id number]   Procedure: HOLMIUM LASER APPLICATION;  Surgeon: Alexis Frock, MD;  Location: [facility];  Service: Urology;  Laterality: Left;   LAPAROSCOPIC GASTRIC SLEEVE RESECTION N/A 07/15/2019   Procedure: LAPAROSCOPIC GASTRIC SLEEVE RESECTION, Upper Endo, ERAS Pathway;  Surgeon: [name], MD;  Location: [facility];  Service: General;  Laterality: N/A;   REPLACEMENT TOTAL KNEE Bilateral 2008   TONSILLECTOMY     TOTAL HIP ARTHROPLASTY Right 10/21/2019   Procedure: RIGHT TOTAL HIP ARTHROPLASTY ANTERIOR APPROACH;  Surgeon: Melrose Nakayama, MD;  Location: [facility];  Service: Orthopedics;  Laterality: Right;   VASECTOMY      Current Medications: Current Meds  Medication Sig   acetaminophen (TYLENOL) 500 MG tablet Take 500 mg by mouth every 6 (six) hours as needed (for pain.).  allopurinol (ZYLOPRIM) 100 MG tablet Take 1 tablet (100 mg total) by mouth daily. Dont start until steroids haved calmed down the attack, but do not stop if an attack returns.   amoxicillin (AMOXIL) 250 MG capsule Take 500 mg by mouth 3 (three) times daily.   Calcium Carb-Cholecalciferol (CALCIUM-VITAMIN D) 600-400 MG-UNIT TABS Take 1 tablet by mouth in the morning, at noon, and at bedtime.   Calcium Carbonate-Vit D-Min (CALCIUM 600+D PLUS MINERALS) 600-400 MG-UNIT TABS Take 1 each by mouth daily as needed.   diclofenac Sodium (VOLTAREN) 1 % GEL Apply 4 g topically 4 (four) times daily as needed.   diphenhydramine-acetaminophen (TYLENOL PM) 25-500 MG TABS tablet Take 2 tablets by mouth at bedtime.    famotidine (PEPCID) 20 MG tablet Take by mouth.    fexofenadine (ALLEGRA) 180 MG tablet Take 180 mg by mouth daily as needed for allergies or rhinitis.   latanoprost (XALATAN) 0.005 % ophthalmic solution Place 1 drop into both eyes at bedtime.    methocarbamol (ROBAXIN) 750 MG tablet Take 1 tablet (750 mg total) by mouth every 6 (six) hours as needed for muscle spasms.   metoprolol succinate (TOPROL XL) 25 MG 24 hr tablet Take 1.5 tablets (37.5 mg total) by mouth daily.   mometasone (NASONEX) 50 MCG/ACT nasal spray Place 1 spray into the nose at bedtime.   Multiple Vitamin (MULTIVITAMIN WITH MINERALS) TABS tablet Take 2 tablets by mouth daily. Bariatric Multivitamin   Multiple Vitamins-Minerals (AIRBORNE PO) Take 1 tablet by mouth daily as needed (immune health).    pantoprazole (PROTONIX) 40 MG tablet Take by mouth.   rosuvastatin (CRESTOR) 20 MG tablet TAKE 1 TABLET DAILY   sacubitril-valsartan (ENTRESTO) 49-51 MG Take 1 tablet by mouth 2 (two) times daily.   sertraline (ZOLOFT) 50 MG tablet TAKE 1 TABLET DAILY   spironolactone (ALDACTONE) 25 MG tablet Take 0.5 tablets (12.5 mg total) by mouth daily.   sucralfate (CARAFATE) 1 g tablet Take by mouth.   Thiamine HCl (VITAMIN B1 PO) Take by mouth.   XARELTO 20 MG TABS tablet TAKE 1 TABLET DAILY WITH   SUPPER     Allergies:   Erythromycin   Social History   Socioeconomic History   Marital status: Married    Spouse name: Not on file   Number of children: Not on file   Years of education: Not on file   Highest education level: Not on file  Occupational History   Occupation: Retired   Tobacco Use   Smoking status: Former    Types: Cigarettes    Quit date: 07/04/2001    Years since quitting: 20.9   Smokeless tobacco: Never  Vaping Use   Vaping Use: Never used  Substance and Sexual Activity   Alcohol use: Yes    Comment: rare   Drug use: Never   Sexual activity: Yes    Comment: MARRIED  Other Topics Concern   Not on file  Social History Narrative   Lives in [place], [place]   Married    2 kids   Retired - Prior worked at [employer] Strain: Not on [place]: Not on file  Transportation Needs: Not on file  Physical Activity: Insufficiently Active (05/19/2022)   Exercise Vital Sign    Days of Exercise per Week: 2 days    Minutes of Exercise per Session: 30 min  Stress: No Stress Concern Present (05/19/2022)   [place] -  Occupational Stress Questionnaire    Feeling of Stress : Not at all  Social Connections: Unknown (05/15/2022)   Social Connection and Isolation Panel [NHANES]    Frequency of Communication with Friends and Family: Not on file    Frequency of Social Gatherings with Friends and Family: Not on file    Attends Religious Services: More than 4 times per year    Active Member of [employer] or Organizations: Not on file    Attends Archivist Meetings: Not on file    Marital Status: Not on file     Family History: The patient's family history includes Alzheimer's disease in his mother; Arthritis in his father and mother; Esophageal cancer in his father; Heart attack in his mother; Heart attack (age of onset: 40) in his father; Hyperlipidemia in his brother, father, and mother; Hypertension in his brother, father, and mother; Other in his brother. There is no history of Atrial fibrillation.  ROS:   Please see the history of present illness.  All other systems reviewed and are negative.  Labs/Other Studies Reviewed:    The following studies were reviewed today:  Echo 04/17/22  1. Compared to echo report from 2018, LVEF is worse.   2. Difficult acoustic windows, particularly apically. Diffuse  hypokinesis, worse in the anterior and septal walls. COnsider limited echo  with Defiinity to better define LVEF and regional wall motion.   3. Left ventricular ejection fraction, by estimation, is 30 to 35%. The  left ventricle has moderately decreased function. The  left ventricle  demonstrates global hypokinesis. There is mild concentric left ventricular  hypertrophy. Left ventricular  diastolic function could not be evaluated.   4. Right ventricular systolic function is moderately reduced. The right  ventricular size is normal.   5. Left atrial size was moderately dilated.   6. Right atrial size was severely dilated.   7. The mitral valve is normal in structure. Mild mitral valve  regurgitation.   8. The aortic valve is tricuspid. Aortic valve regurgitation is not  visualized.   9. The inferior vena cava is normal in size with greater than 50%  respiratory variability, suggesting right atrial pressure of 3 mmHg.  Cardiac monitor 01/28/21   Atrial fibrillation with average HR in the high 50s to low 70s range. Pauses noted during sleeping hours.   Echo 07/11/16  Left ventricle: The cavity size was normal. Wall thickness was    increased in a pattern of mild LVH. Systolic function was normal.    The estimated ejection fraction was in the range of 55% to 60%.    Wall motion was normal; there were no regional wall motion    abnormalities. Doppler parameters are consistent with abnormal    left ventricular relaxation (grade 1 diastolic dysfunction).  - Left atrium: The atrium was mildly dilated. Volume/bsa, S: 37.4    ml/m^2.   Recent Labs: 03/13/2022: ALT 21; Hemoglobin 16.7; Platelets 174.0; TSH 1.36 06/02/2022: BUN 17; Creatinine, Ser 0.92; Potassium 4.4; Sodium 141  Recent Lipid Panel    Component Value Date/Time   CHOL 148 03/13/2022 0853   CHOL 133 02/18/2019 0836   TRIG 154.0 (H) 03/13/2022 0853   HDL 48.70 03/13/2022 0853   HDL 40 02/18/2019 0836   CHOLHDL 3 03/13/2022 0853   VLDL 30.8 03/13/2022 0853   LDLCALC 68 03/13/2022 0853   LDLCALC 63 02/18/2019 0836   LDLDIRECT 76.0 06/11/2020 0938     Risk Assessment/Calculations:    CHA2DS2-VASc Score = 3  {  This indicates a 3.2% annual risk of stroke. The patient's score is based  upon: CHF History: 1 HTN History: 1 Diabetes History: 0 Stroke History: 0 Vascular Disease History: 0 Age Score: 1 Gender Score: 0    Physical Exam:    VS:  BP 112/68   Pulse 62   Ht 6' 3"$  (1.905 m)   Wt (!) 313 lb (142 kg)   SpO2 99%   BMI 39.12 kg/m     Wt Readings from Last 3 Encounters:  06/13/22 (!) 313 lb (142 kg)  05/26/22 (!) 316 lb (143.3 kg)  05/19/22 (!) 315 lb 3.2 oz (143 kg)     GEN:  Obese male in no acute distress HEENT: Normal NECK: No JVD; No carotid bruits CARDIAC: Irregular RR, no murmurs, rubs, gallops RESPIRATORY:  Clear to auscultation without rales, wheezing or rhonchi  ABDOMEN: Soft, non-tender, protuberant MUSCULOSKELETAL:  No edema; No deformity. 2+ pedal pulses, equal bilaterally SKIN: Warm and dry NEUROLOGIC:  Alert and oriented x 3 PSYCHIATRIC:  Normal affect   EKG:  EKG is not ordered today   Diagnoses:    1. Chronic HFrEF (heart failure with reduced ejection fraction) ([place])   2. OSA on CPAP   3. Cardiomyopathy, unspecified type ([place])   4. LBBB (left bundle branch block)   5. Chronic anticoagulation   6. Longstanding persistent atrial fibrillation ([place])   7. Bradycardia   8. Achalasia of esophagus   9. Essential hypertension   10. Morbid obesity ([place])      Assessment and Plan:     Bradycardia/Longstanding atrial fibrillation: Report from [facility] states patient was in a fib with slow ventricular response during hospitalization. He was previously on metoprolol succinate 37.5 mg daily. It is not clear if this was initially reduced, then stopped or just stopped.  He is asymptomatic in the setting of A-fib. No palpitations or feeling his HR speed up.  Clinically appears to be in atrial fibrillation today. Will resume Toprol XL 12.5 mg daily for rate control and HFrEF. Advised him to continue to monitor HR with smart watch and notify us with concerns. Continue Xarelto.   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  On  liquid diet.  Encouraged him to continue to limit sodium.  Resume physical activity as tolerated.  HFrEF/Cardiomyopathy: Echo 04/17/22 revealed newly reduced LVEF 30 to 35% (normal in 2018), global hypokinesis, mild LVH. Presented with DOE. Has continued to exercise at same pace. No orthopnea, PND, or edema. Reports weight is stable. Continue to limit sodium to < 2000 mg, limit fluids to 2 L daily.  No side effects on medications, stable renal function. Continue spironolactone, Entresto, resuming low-dose metoprolol today.   Left bundle branch block:  Stable HR. LBBB known. LV function newly reduced 30-35% on echo 03/2022. Continuing up titration of GDMT with close follow-up. Consider referral to EP if no improvement on medical therapy or if he becomes symptomatic.    Hypertension: BP is well controlled   OSA: Compliant with CPAP. Has concerns about his equipment coming off during the night.  Has appointment with sleep medicine next month, may seek to change providers in the future.  Morbid obesity: Weight loss 2/2 liquid diet s/p esophageal procedure. Encouraged heart healthy low sodium diet < 2000 mg daily, 150 minutes of moderate intensity exercise each week. Continue Ozempic.   Hyperlipidemia: LDL 68 on 03/13/22. Continue rosuvastatin.      Disposition: Keep your March appointment with Dr. Irish Lack  Medication Adjustments/Labs  and Tests Ordered: Current medicines are reviewed at length with the patient today.  Concerns regarding medicines are outlined above.  No orders of the defined types were placed in this encounter.  Meds ordered this encounter  Medications   metoprolol succinate (TOPROL XL) 25 MG 24 hr tablet    Sig: Take 1.5 tablets (37.5 mg total) by mouth daily.    Dispense:  45 tablet    Refill:  6    Patient Instructions  Medication Instructions:  RESUME Metoprolol 37.10m Take 1 once a day  *If you need a refill on your cardiac medications before your next appointment,  please call your pharmacy*   Lab Work: None ordered   Testing/Procedures: None ordered   Follow-Up: [facility] and [facility] our [facility].  As part of our continuing mission to provide you with exceptional heart care, we have created designated Provider Care Teams.  These Care Teams include your primary Cardiologist (physician) and Advanced Practice Providers (APPs -  Physician Assistants and Nurse Practitioners) who all work together to provide you with the care you need, when you need it.  We recommend signing up for the patient portal called "MyChart".  Sign up information is provided on this After Visit Summary.  MyChart is used to connect with patients for Virtual Visits (Telemedicine).  Patients are able to view lab/test results, encounter notes, upcoming appointments, etc.  Non-urgent messages can be sent to your provider as well.   To learn more about what you can do with MyChart, go to [email or web address]    Your next appointment:   FOLLOW UP AS SCHEDULED   Provider:   JLarae Grooms MD     Other Instructions     Signed, SEmmaline Life NP  06/13/2022 3:12 PM    [place]

## 2022-06-13 NOTE — Telephone Encounter (Signed)
Please find out if patient has any BP and HR readings for evaluation. I cannot discern what medications were made at [facility].

## 2022-06-14 ENCOUNTER — Ambulatory Visit: Payer: Medicare Other | Admitting: Internal Medicine

## 2022-06-16 ENCOUNTER — Encounter: Payer: Self-pay | Admitting: Internal Medicine

## 2022-06-16 ENCOUNTER — Encounter: Payer: Medicare Other | Admitting: Family Medicine

## 2022-06-16 ENCOUNTER — Ambulatory Visit: Payer: Medicare Other | Admitting: Internal Medicine

## 2022-06-16 VITALS — BP 118/80 | HR 45 | Resp 16 | Ht 75.0 in | Wt 312.2 lb

## 2022-06-16 DIAGNOSIS — I429 Cardiomyopathy, unspecified: Secondary | ICD-10-CM | POA: Diagnosis not present

## 2022-06-16 DIAGNOSIS — E559 Vitamin D deficiency, unspecified: Secondary | ICD-10-CM

## 2022-06-16 DIAGNOSIS — G4733 Obstructive sleep apnea (adult) (pediatric): Secondary | ICD-10-CM

## 2022-06-16 DIAGNOSIS — I5022 Chronic systolic (congestive) heart failure: Secondary | ICD-10-CM

## 2022-06-16 DIAGNOSIS — I4819 Other persistent atrial fibrillation: Secondary | ICD-10-CM

## 2022-06-16 DIAGNOSIS — I447 Left bundle-branch block, unspecified: Secondary | ICD-10-CM | POA: Diagnosis not present

## 2022-06-16 DIAGNOSIS — E7849 Other hyperlipidemia: Secondary | ICD-10-CM

## 2022-06-16 MED ORDER — REPATHA 140 MG/ML ~~LOC~~ SOSY: 140.0000 mg | PREFILLED_SYRINGE | SUBCUTANEOUS | 11 refills | Status: DC

## 2022-06-16 MED ORDER — OZEMPIC (0.25 OR 0.5 MG/DOSE) 2 MG/3ML ~~LOC~~ SOPN: 0.2500 mg | PEN_INJECTOR | SUBCUTANEOUS | 3 refills | Status: DC

## 2022-06-16 NOTE — Progress Notes (Signed)
[facility]: [id number]   Routine Medical Office Visit  Patient:  Isaiah Jones      Age: 72 y.o.       Sex:  male  Date:   06/16/2022  PCP:    Isaiah Jones, [place] Provider: Loralee Pacas, MD   Problem Focused Charting:   Medical Decision Making per Assessment/Plan   [name] was seen today for transitions of care.  LBBB (left bundle branch block) Overview: Per 05/2022 cardiology evaluation:    LBBB known. HR has been stable. Newly reduced LV function on echo 03/2022. Continuing up titration of GDMT with close follow-up. Consider referral to EP if no improvement on medical therapy or if he becomes symptomatic.       Orders: -     Ozempic (0.25 or 0.5 MG/DOSE); Inject 0.25 mg into the skin once a week.  Dispense: 3 mL; Refill: 3  Morbid obesity ([place]) Overview: History lap sleeve Difficulty getting medication to support BMI Readings from Last 10 Encounters:  06/16/22 39.02 kg/m  06/13/22 39.12 kg/m  05/26/22 39.50 kg/m  05/19/22 39.40 kg/m  05/19/22 40.91 kg/m  04/19/22 40.75 kg/m  03/27/22 39.96 kg/m  02/17/22 39.65 kg/m  01/05/22 38.52 kg/m  11/24/21 39.67 kg/m     Assessment & Plan: Encouraged continuing with diet and exercise Encouraged continuing with efforts to get Ozempic. For heart disease mainly    Cardiomyopathy, unspecified type ([place]) -     Ozempic (0.25 or 0.5 MG/DOSE); Inject 0.25 mg into the skin once a week.  Dispense: 3 mL; Refill: 3 -     Repatha; Inject 140 mg into the skin every 14 (fourteen) days.  Dispense: 2.1 mL; Refill: 11  Chronic HFrEF (heart failure with reduced ejection fraction) ([place]) Overview:  Per cardiologist 05/2022 follow up : HFrEF/Cardiomyopathy: Echo 04/17/22 revealed newly reduced LVEF 30 to 35% (normal in 2018), global hypokinesis, mild LVH. He was having DOE that he felt was stable, secondary to weight. Has continued to exercise at same pace. No orthopnea, PND, or  edema. Reports weight is stable. Continue to limit sodium to < 2000 mg, limit fluids to 2 L daily.  Tolerating Entresto with out adverse side effects. He is feeling well. Lengthy discussion about GDMT for CHF. Would like to try generic medication first. Will add spironolactone 12.5 mg daily. Will get BMP on 06/02/22.      Orders: -     Ozempic (0.25 or 0.5 MG/DOSE); Inject 0.25 mg into the skin once a week.  Dispense: 3 mL; Refill: 3  Other hyperlipidemia Overview: Needs aggressive Low Density Lipoprotein (LDL cholesterol) goal due to heart failure with reduced ejection fraction, lbbb, obesity, former smoker, persistent atrial fibrillation Lipid Panel     Component Value Date/Time   CHOL 148 03/13/2022 0853   CHOL 133 02/18/2019 0836   TRIG 154.0 (H) 03/13/2022 0853   HDL 48.70 03/13/2022 0853   HDL 40 02/18/2019 0836   CHOLHDL 3 03/13/2022 0853   VLDL 30.8 03/13/2022 0853   LDLCALC 68 03/13/2022 0853   LDLCALC 63 02/18/2019 0836   LDLDIRECT 76.0 06/11/2020 0938   LABVLDL 30 02/18/2019 0836     Assessment & Plan: Reviewed cholesterol- recommend Low Density Lipoprotein (LDL cholesterol) of 55 which he has not been able to achieve with statin Will try to get repatha   OSA on CPAP Overview: He is consistent with his CPAP machine long-term  Assessment & Plan: Evaluated his home oxygen  reads Machine mainly working Encouraged continuing with self monitoring assessments   Persistent atrial fibrillation [facility]) Overview: 05/2022 cardiology evaluation: Persistent atrial fibrillation on chronic anticoagulation: Clinically appears to be in atrial fibrillation.HR is  well controlled. No frequent palpitations or concerns for tachycardia. Overall, not very symptomatic. Continue rate control strategy with Toprol XL. No bleeding concerns. Continue Xarelto 20 mg daily, which is appropriate dose for Scr, for stroke prevention.   Assessment & Plan: Rate control strategy working well per  cardiology- but I'm concerned its making weight loss difficult  Encouraged discussing with cardiology possibly doing an ablation to come off beta blocker Encouraged continuing with obstructive sleep apnea optimization Encouraged continuing with weight loss efforts Encouraged continuing with same medications for now.   Vitamin D deficiency Overview: Is been taking vitamin D since February 2023 shows vitamin D on the low range I encouraged him to stay on it for the rest of his life Lab Results  Component Value Date/Time   VD25OH 37.40 03/13/2022 08:53 AM   VD25OH 27.27 (L) 06/14/2021 09:38 AM   VD25OH 28.31 (L) 06/11/2020 09:38 AM   VD25OH 14.89 (L) 06/11/2019 09:44 AM      Assessment & Plan: Resolving but will likely need to stay on supplement long term Will get another check in late 2024.    Main goals are weight loss, reduce myocardial infarction risk with Ozempic and repatha addons today Follow up in 1 to 6 months      Subjective - Clinical Presentation:   Isaiah Jones is a 72 y.o. male  Patient Active Problem List   Diagnosis Date Noted   Insomnia with sleep apnea 05/26/2022   History of arthritis 05/26/2022   Blood in urine 05/26/2022   Allergic rhinitis 05/26/2022   Morbid obesity ([place]) 05/26/2022   Hypertension 05/26/2022   At risk for falls 05/26/2022   Chronic HFrEF (heart failure with reduced ejection fraction) ([place]) 04/20/2022   Achalasia of esophagus 03/30/2022   LBBB (left bundle branch block) 03/29/2022   Hypertensive heart disease with heart failure ([place]) 03/29/2022   Anticoagulant long-term use 03/29/2022   Hypertriglyceridemia 03/14/2022   Dysphagia 02/17/2022   Hyperlipidemia 02/17/2022   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   Laryngopharyngeal reflux (LPR) 10/11/2021   Chronic cough 02/12/2020   Former smoker 12/10/2019   Primary localized osteoarthritis of right hip 10/21/2019   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   Vitamin D deficiency  06/16/2019   Erectile dysfunction 12/05/2018   Essential hypertension 12/05/2018   Gout 12/05/2018   Osteoarthritis 12/05/2018   Seasonal allergies 12/05/2018   Depression, major, 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 12/05/2018   Foot pain, right 03/09/2018   Persistent atrial fibrillation ([facility])    OSA on CPAP    Dyslipidemia    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    BMI 45.0-49.9, adult ([place]) 03/09/2018   CHF (congestive heart failure) (HCC)    Colon polyps    Complication of anesthesia    Depression    Dyslipidemia    Dysrhythmia 2017   A fib   GERD (gastroesophageal reflux disease)    Glaucoma    early stage   History of 2019 novel coronavirus disease (COVID-19) 06/2019   History of kidney stones    Hypertension    Hypertriglyceridemia 03/14/2022   Lab Results Component Value Date/Time  TRIG 154.0 (H) 03/13/2022 08:53  AM  TRIG 188.0 (H) 06/14/2021 09:38 AM  TRIG 203.0 (H) 06/11/2020 09:38 AM  TRIG 166.0 (H) 06/11/2019 09:44 AM  TRIG 177 (H) 02/18/2019 08:36 AM     OSA on CPAP    C-Pap   Persistent atrial fibrillation ([place])    Echo 3/18: Mild LVH, EF 55-60, normal wall motion, grade 1 diastolic dysfunction, mild LAE   Personal history of gout 05/23/2019   Pneumonia    childhood   PONV (postoperative nausea and vomiting)    after 1st knee surgery only   Rhinitis, allergic     Outpatient Medications Prior to Visit  Medication Sig   acetaminophen (TYLENOL) 500 MG tablet Take 500 mg by mouth every 6 (six) hours as needed (for pain.).   allopurinol (ZYLOPRIM) 100 MG tablet Take 1 tablet (100 mg total) by mouth daily. Dont start until steroids haved calmed down the attack, but do not stop if an attack returns.   amoxicillin (AMOXIL) 250 MG capsule Take 500 mg by mouth 3 (three) times daily.   Calcium Carb-Cholecalciferol  (CALCIUM-VITAMIN D) 600-400 MG-UNIT TABS Take 1 tablet by mouth in the morning, at noon, and at bedtime.   Calcium Carbonate-Vit D-Min (CALCIUM 600+D PLUS MINERALS) 600-400 MG-UNIT TABS Take 1 each by mouth daily as needed.   diclofenac Sodium (VOLTAREN) 1 % GEL Apply 4 g topically 4 (four) times daily as needed.   diphenhydramine-acetaminophen (TYLENOL PM) 25-500 MG TABS tablet Take 2 tablets by mouth at bedtime.    famotidine (PEPCID) 20 MG tablet Take by mouth.   fexofenadine (ALLEGRA) 180 MG tablet Take 180 mg by mouth daily as needed for allergies or rhinitis.   Ginkgo Biloba 60 MG TABS Take 60 mg by mouth daily.   latanoprost (XALATAN) 0.005 % ophthalmic solution Place 1 drop into both eyes at bedtime.    methocarbamol (ROBAXIN) 750 MG tablet Take 1 tablet (750 mg total) by mouth every 6 (six) hours as needed for muscle spasms.   metoprolol succinate (TOPROL XL) 25 MG 24 hr tablet Take 1.5 tablets (37.5 mg total) by mouth daily.   mometasone (NASONEX) 50 MCG/ACT nasal spray Place 1 spray into the nose at bedtime.   Multiple Vitamin (MULTIVITAMIN WITH MINERALS) TABS tablet Take 2 tablets by mouth daily. Bariatric Multivitamin   Multiple Vitamins-Minerals (AIRBORNE PO) Take 1 tablet by mouth daily as needed (immune health).    pantoprazole (PROTONIX) 40 MG tablet Take by mouth.   rosuvastatin (CRESTOR) 20 MG tablet TAKE 1 TABLET DAILY   sacubitril-valsartan (ENTRESTO) 49-51 MG Take 1 tablet by mouth 2 (two) times daily.   sertraline (ZOLOFT) 50 MG tablet TAKE 1 TABLET DAILY   spironolactone (ALDACTONE) 25 MG tablet Take 0.5 tablets (12.5 mg total) by mouth daily.   sucralfate (CARAFATE) 1 g tablet Take by mouth.   Thiamine HCl (VITAMIN B1 PO) Take by mouth.   XARELTO 20 MG TABS tablet TAKE 1 TABLET DAILY WITH   SUPPER   [DISCONTINUED] Semaglutide,0.25 or 0.5MG/DOS, (OZEMPIC, 0.25 OR 0.5 MG/DOSE,) 2 MG/3ML SOPN Inject 0.25 mg into the skin once a week.   No facility-administered medications  prior to visit.    Chief Complaint  Patient presents with   Transitions Of Care    HPI  Home reads pulse 42-81 Blood pressure 104 to 138 Wt Readings from Last 3 Encounters:  06/16/22 (!) 312 lb 3.2 oz (141.6 kg)  06/13/22 (!) 313 lb (142 kg)  05/26/22 (!) 316 lb (143.3 kg)  Home oxygen mostly  controlled with CPAP. Lab Results  Component Value Date   HGBA1C 5.7 06/14/2021  Insurance refused weight loss medication Ozempic  due to nondiabetic. But I wanted for heart dz        Objective:  Physical Exam  BP 118/80   Pulse (!) 45   Resp 16   Ht 6' 3"$  (1.905 m)   Wt (!) 312 lb 3.2 oz (141.6 kg)   SpO2 96%   BMI 39.02 kg/m  Obese  by BMI criteria but truncal adiposity (waist circumference or caliper) should be used instead.  He does have truncal adiposity. Wt Readings from Last 10 Encounters:  06/16/22 (!) 312 lb 3.2 oz (141.6 kg)  06/13/22 (!) 313 lb (142 kg)  05/26/22 (!) 316 lb (143.3 kg)  05/19/22 (!) 315 lb 3.2 oz (143 kg)  05/19/22 (!) 318 lb 9.6 oz (144.5 kg)  04/19/22 (!) 317 lb 6.4 oz (144 kg)  03/27/22 (!) 311 lb 3.2 oz (141.2 kg)  02/17/22 (!) 308 lb 12.8 oz (140.1 kg)  01/05/22 300 lb (136.1 kg)  11/24/21 (!) 309 lb (140.2 kg)   Vital signs reviewed.  Nursing notes reviewed. Weight trend reviewed. General Appearance:  Well developed, well nourished male in no acute distress.   Normal work of breathing at rest Musculoskeletal: All extremities are intact.  Neurological:  Awake, alert,  No obvious focal neurological deficits or cognitive impairments Psychiatric:  Appropriate mood, pleasant demeanor Problem-specific findings:  heart rate runs low on home reads.   Results Reviewed: No results found for any visits on 06/16/22.  Recent Results (from the past 2160 hour(s))  ECHOCARDIOGRAM COMPLETE     Status: None   Collection Time: 04/17/22  8:17 AM  Result Value Ref Range   S' Lateral 4.10 cm  Basic Metabolic Panel (BMET)     Status: None   Collection Time:  06/02/22 12:00 AM  Result Value Ref Range   Glucose 90 70 - 99 mg/dL   BUN 17 8 - 27 mg/dL   Creatinine, Ser 0.92 0.76 - 1.27 mg/dL   eGFR 88 >59 mL/min/1.73   BUN/Creatinine Ratio 18 10 - 24   Sodium 141 134 - 144 mmol/L   Potassium 4.4 3.5 - 5.2 mmol/L   Chloride 102 96 - 106 mmol/L   CO2 23 20 - 29 mmol/L   Calcium 9.8 8.6 - 10.2 mg/dL  Specimen status report     Status: None   Collection Time: 06/02/22 12:00 AM  Result Value Ref Range   specimen status report Comment     Comment: Please note Please note The date and/or time of collection was not indicated on the requisition as required by state and federal law.  The date of receipt of the specimen was used as the collection date if not supplied.           Signed: Loralee Pacas, MD 06/16/2022 10:31 AM

## 2022-06-16 NOTE — Assessment & Plan Note (Signed)
Evaluated his home oxygen reads Machine mainly working Encouraged continuing with self monitoring assessments

## 2022-06-16 NOTE — Assessment & Plan Note (Signed)
Reviewed cholesterol- recommend Low Density Lipoprotein (LDL cholesterol) of 55 which he has not been able to achieve with statin Will try to get repatha

## 2022-06-16 NOTE — Assessment & Plan Note (Signed)
Encouraged continuing with diet and exercise Encouraged continuing with efforts to get Ozempic. For heart disease mainly

## 2022-06-16 NOTE — Assessment & Plan Note (Signed)
Rate control strategy working well per cardiology- but I'm concerned its making weight loss difficult  Encouraged discussing with cardiology possibly doing an ablation to come off beta blocker Encouraged continuing with obstructive sleep apnea optimization Encouraged continuing with weight loss efforts Encouraged continuing with same medications for now.

## 2022-06-16 NOTE — Patient Instructions (Signed)
Rerequested Ozempic to protect heart- diabetes mellitus should not be required  Reviewed home heart rate too low- talk to cardiology- I think you need ablation because you are staying in atrial fibrillation too low Reviewed home sleep data- keep it up, and try buying a resmed p10 and see if you get better sleep. Keep trying to lose weight with diet and exercise.

## 2022-06-16 NOTE — Assessment & Plan Note (Signed)
Resolving but will likely need to stay on supplement long term Will get another check in late 2024.

## 2022-06-26 NOTE — Telephone Encounter (Signed)
Pharmacy Patient Advocate Encounter  Received notification from [place] that the request for prior authorization for Ozempic has been denied due to .    Please be advised we currently do not have a Pharmacist to review denials, therefore you will need to process appeals accordingly as needed. Thanks for your support at this time.  E-appeal available: KEY [id number]  You may call [phone] or fax [phone], to appeal.

## 2022-06-27 ENCOUNTER — Telehealth: Payer: Self-pay | Admitting: Internal Medicine

## 2022-06-27 NOTE — Telephone Encounter (Signed)
Pt would like a call back about the PA for  Semaglutide,0.25 or 0.'5MG'$ /DOS, (OZEMPIC, 0.25 OR 0.5 MG/DOSE,) 2 MG/3ML SOPN

## 2022-06-27 NOTE — Telephone Encounter (Signed)
Left vm for patient concerning this.

## 2022-06-28 ENCOUNTER — Telehealth: Payer: Self-pay | Admitting: Internal Medicine

## 2022-06-28 NOTE — Telephone Encounter (Signed)
Pharmacist, community at [facility] [place] [facility] [place] Night Provider Berniece Pap- MD Contact Type Call Who Is Calling Patient / Member / Family / Caregiver Caller Name Deone Mather Caller Phone Number [phone] Patient Name Isaiah Jones Patient DOB [birth year] Call Type Message Only Information Provided Reason for Call Request for General Office Information Initial Comment Caller is returning call from office. He states his is having issues with getting Ozempic. He received call from pharmacy and has a different medication [facility]

## 2022-07-05 NOTE — Telephone Encounter (Signed)
Spoke with patient concerning this.

## 2022-07-05 NOTE — Telephone Encounter (Signed)
Patient informed me that he has [facility] and has started it.

## 2022-07-24 NOTE — Progress Notes (Unsigned)
Cardiology Office Note   Date:  07/25/2022   ID:  [name], [name] 20-Feb-1951, MRN [id number]  PCP:  Loralee Pacas, MD    No chief complaint on file.  AFib  Wt Readings from Last 3 Encounters:  07/25/22 (!) 301 lb (136.5 kg)  06/16/22 (!) 312 lb 3.2 oz (141.6 kg)  06/13/22 (!) 313 lb (142 kg)       History of Present Illness: Isaiah Jones is a 72 y.o. male   with history of hyperlipidemia, hypertension and obesity. He also is treated for sleep apnea with CPAP. He was seen in our office back in March 2018 due to new onset atrial fibrillation. His pulmonologist found the irregular heartbeat. Atrial fibrillation was diagnosed. He has had some fatigue and intermittent palpitations over the past few months prior to diagnosis.   At his initial visit in our office in March 2018, he was started on Toprol-XL 50 mg daily for rate control, Xarelto 20 mg daily for stroke prevention. Cardioversion was considered as well. He was advised to lose weight.   Echocardiogram from March 2018 revealed: - Left ventricle: The cavity size was normal. Wall thickness was   increased in a pattern of mild LVH. Systolic function was normal.   The estimated ejection fraction was in the range of 55% to 60%.   Wall motion was normal; there were no regional wall motion   abnormalities. Doppler parameters are consistent with abnormal   left ventricular relaxation (grade 1 diastolic dysfunction). - Left atrium: The atrium was mildly dilated. Volume/bsa, S: 37.4   ml/m^2.   He had his cardioversion but it was not successful.  He has been maintained on rate control meds. He feels colder than he did before after starting anticoagulation.   He has had weight fluctuations.  He has some hip problems but was told he is too big for ortho surgery.  He was 375 lbs at that time.    At the last visit, he was considering weight loss surgery.  It occurred in 3/21.  Then he had COVID.  Lost 70 lbs so far after  surgery.   Hip replacement in 6/21.  Dr. Rhona Raider.   Monitor showed in 12/2020: "Atrial fibrillation with average HR in the high 50s to low 70s range.  Pauses noted during sleeping hours. Frequent PVCs. "    In 3/23: "Rare, palpitations lasting just a few minutes.   Occasional leg edema which resolves by elevating legs."   EF in December 2023 showed decreased LVEF.  No angina at that time.  He was started on heart failure medication. Had admission to [facility] in [facility]/2022.  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  It was noted that A-fib persisted on the monitor.  Has some balance issues.    Denies : Chest pain. Dizziness. Leg edema. Nitroglycerin use. Orthopnea. Palpitations. Paroxysmal nocturnal dyspnea. Shortness of breath. Syncope.    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    BMI 45.0-49.9, adult ([place]) 03/09/2018   CHF (congestive heart failure) (HCC)    Colon polyps    Complication of anesthesia    Depression    Dyslipidemia    Dysrhythmia 2017   A fib   GERD (gastroesophageal reflux disease)    Glaucoma    early stage   History of 2019 novel  coronavirus disease (COVID-19) 06/2019   History of kidney stones    Hypertension    Hypertriglyceridemia 03/14/2022   Lab Results Component Value Date/Time  TRIG 154.0 (H) 03/13/2022 08:53 AM  TRIG 188.0 (H) 06/14/2021 09:38 AM  TRIG 203.0 (H) 06/11/2020 09:38 AM  TRIG 166.0 (H) 06/11/2019 09:44 AM  TRIG 177 (H) 02/18/2019 08:36 AM     OSA on CPAP    C-Pap   Persistent atrial fibrillation ([place])    Echo 3/18: Mild LVH, EF 55-60, normal wall motion, grade 1 diastolic dysfunction, mild LAE   Personal history of gout 05/23/2019   Pneumonia    childhood   PONV (postoperative nausea and vomiting)    after 1st knee surgery only   Rhinitis, allergic     Past Surgical  History:  Procedure Laterality Date   arthroscopic knee surgery Bilateral 2003   CARDIOVERSION N/A 08/10/2016   Procedure: CARDIOVERSION;  Surgeon: Pixie Casino, MD;  Location: [place];  Service: Cardiovascular;  Laterality: N/A;   COLONOSCOPY     CYSTOSCOPY WITH RETROGRADE PYELOGRAM, URETEROSCOPY AND STENT PLACEMENT Left 03/04/2021   Procedure: CYSTOSCOPY WITH BILATERAL RETROGRADES,PYELOGRAM, URETEROSCOPY AND STENT PLACEMENT;  Surgeon: Alexis Frock, MD;  Location: [facility];  Service: Urology;  Laterality: Left;  1 HR   ESOPHAGEAL MANOMETRY N/A 03/22/2022   Procedure: ESOPHAGEAL MANOMETRY (EM);  Surgeon: Sharyn Creamer, MD;  Location: [facility];  Service: Gastroenterology;  Laterality: N/A;   HOLMIUM LASER APPLICATION Left [id number]   Procedure: HOLMIUM LASER APPLICATION;  Surgeon: Alexis Frock, MD;  Location: [facility];  Service: Urology;  Laterality: Left;   LAPAROSCOPIC GASTRIC SLEEVE RESECTION N/A 07/15/2019   Procedure: LAPAROSCOPIC GASTRIC SLEEVE RESECTION, Upper Endo, ERAS Pathway;  Surgeon: [name], MD;  Location: [facility];  Service: General;  Laterality: N/A;   REPLACEMENT TOTAL KNEE Bilateral 2008   TONSILLECTOMY     TOTAL HIP ARTHROPLASTY Right 10/21/2019   Procedure: RIGHT TOTAL HIP ARTHROPLASTY ANTERIOR APPROACH;  Surgeon: Melrose Nakayama, MD;  Location: [facility];  Service: Orthopedics;  Laterality: Right;   VASECTOMY       Current Outpatient Medications  Medication Sig Dispense Refill   acetaminophen (TYLENOL) 500 MG tablet Take 500 mg by mouth every 6 (six) hours as needed (for pain.).     allopurinol (ZYLOPRIM) 100 MG tablet Take 1 tablet (100 mg total) by mouth daily. Dont start until steroids haved calmed down the attack, but do not stop if an attack returns. 30 tablet 6   amLODipine (NORVASC) 5 MG tablet Take 1 tablet by mouth daily.     amoxicillin (AMOXIL) 250 MG capsule Take 500 mg by mouth 3 (three) times daily. Prior to Dental     Azelastine HCl 137  MCG/SPRAY SOLN 2 sprays.     Calcium Carb-Cholecalciferol (CALCIUM-VITAMIN D) 600-400 MG-UNIT TABS Take 1 tablet by mouth in the morning, at noon, and at bedtime.     diclofenac Sodium (VOLTAREN) 1 % GEL Apply 4 g topically 4 (four) times daily as needed. 100 g 3   diphenhydramine-acetaminophen (TYLENOL PM) 25-500 MG TABS tablet Take 2 tablets by mouth at bedtime.      fexofenadine (ALLEGRA) 180 MG tablet Take 180 mg by mouth daily as needed for allergies or rhinitis.     latanoprost (XALATAN) 0.005 % ophthalmic solution Place 1 drop into both eyes at bedtime.      methocarbamol (ROBAXIN) 750 MG tablet Take 1 tablet (750 mg total) by mouth every 6 (six) hours as needed  for muscle spasms. 40 tablet 1   metoprolol succinate (TOPROL XL) 25 MG 24 hr tablet Take 1.5 tablets (37.5 mg total) by mouth daily. 45 tablet 6   mometasone (NASONEX) 50 MCG/ACT nasal spray Place 1 spray into the nose at bedtime.     Multiple Vitamin (MULTIVITAMIN WITH MINERALS) TABS tablet Take 2 tablets by mouth daily. Bariatric Multivitamin     Multiple Vitamins-Minerals (AIRBORNE PO) Take 1 tablet by mouth daily as needed (immune health).      rosuvastatin (CRESTOR) 20 MG tablet TAKE 1 TABLET DAILY 90 tablet 3   sacubitril-valsartan (ENTRESTO) 49-51 MG Take 1 tablet by mouth 2 (two) times daily. 180 tablet 3   Semaglutide,0.25 or 0.5MG /DOS, (OZEMPIC, 0.25 OR 0.5 MG/DOSE,) 2 MG/3ML SOPN Inject 0.25 mg into the skin once a week. 3 mL 3   sertraline (ZOLOFT) 50 MG tablet TAKE 1 TABLET DAILY 90 tablet 1   spironolactone (ALDACTONE) 25 MG tablet Take 0.5 tablets (12.5 mg total) by mouth daily. 45 tablet 3   WEGOVY 0.5 MG/0.5ML SOAJ Inject 0.5 mg into the skin once a week.     XARELTO 20 MG TABS tablet TAKE 1 TABLET DAILY WITH   SUPPER 90 tablet 1   Calcium Carbonate-Vit D-Min (CALCIUM 600+D PLUS MINERALS) 600-400 MG-UNIT TABS Take 1 each by mouth daily as needed. (Patient not taking: Reported on 07/25/2022)     No current  facility-administered medications for this visit.    Allergies:   Erythromycin    Social History:  The patient  reports that he quit smoking about 21 years ago. His smoking use included cigarettes. He has never used smokeless tobacco. He reports current alcohol use. He reports that he does not use drugs.   Family History:  The patient's family history includes Alzheimer's disease in his mother; Arthritis in his father and mother; Esophageal cancer in his father; Heart attack in his mother; Heart attack (age of onset: 16) in his father; Hyperlipidemia in his brother, father, and mother; Hypertension in his brother, father, and mother; Other in his brother.    ROS:  Please see the history of present illness.   Otherwise, review of systems are positive for increased AFib; intentional weight loss.   All other systems are reviewed and negative.    PHYSICAL EXAM: VS:  BP 134/68   Pulse 74   Ht 6\' 3"  (1.905 m)   Wt (!) 301 lb (136.5 kg)   SpO2 96%   BMI 37.62 kg/m  , BMI Body mass index is 37.62 kg/m. GEN: Well nourished, well developed, in no acute distress HEENT: normal Neck: no JVD, carotid bruits, or masses Cardiac: iregularly irregular; no murmurs, rubs, or gallops,no edema  Respiratory:  clear to auscultation bilaterally, normal work of breathing GI: soft, nontender, nondistended, + BS MS: no deformity or atrophy Skin: warm and dry, no rash Neuro:  Strength and sensation are intact Psych: euthymic mood, full affect   EKG:   The ekg ordered today demonstrates    Recent Labs: 03/13/2022: ALT 21; Hemoglobin 16.7; Platelets 174.0; TSH 1.36 06/02/2022: BUN 17; Creatinine, Ser 0.92; Potassium 4.4; Sodium 141   Lipid Panel    Component Value Date/Time   CHOL 148 03/13/2022 0853   CHOL 133 02/18/2019 0836   TRIG 154.0 (H) 03/13/2022 0853   HDL 48.70 03/13/2022 0853   HDL 40 02/18/2019 0836   CHOLHDL 3 03/13/2022 0853   VLDL 30.8 03/13/2022 0853   LDLCALC 68 03/13/2022 0853    LDLCALC  63 02/18/2019 0836   LDLDIRECT 76.0 06/11/2020 0938     Other studies Reviewed: Additional studies/ records that were reviewed today with results demonstrating: November 2023 total cholesterol 148 HDL 48 LDL 68 triglycerides 154 A1c 5.7.   ASSESSMENT AND PLAN:  Atrial fibrillation: Rate controlled. Xarelto for stroke prevention.  Continue low dose metoprolol.  Had some brief pauses which cause metoprolol to be stopped.  Low-dose metoprolol was re-added.  No syncope.  No significant lightheadedness. HFrEF: No angina.  EF 30-35%.  No problems during his EGD procedure from a cardiac standpoint other than the concern of more persistent atrial fibrillation.  Entresto metoprolol, spironolactone should all help with getting the heart stronger.  Repeat echocardiogram with perflutren.  If EF stays low, may need to consider ischemic workup. Hypertensive heart disease: The current medical regimen is effective;  continue present plan and medications. Anticoagulated: No bleeding issues Morbid obesity: Whole food, plant-based diet.  He has started on Wegovy and is lost over 10 pounds since February. Hyperlipidemia:The current medical regimen is effective;  continue present plan and medications. Obstructive sleep apnea: Using CPAP.  Checks O2 at nights and some readings in the 86-87% range.   Current medicines are reviewed at length with the patient today.  The patient concerns regarding his medicines were addressed.  The following changes have been made:  No change  Labs/ tests ordered today include:  No orders of the defined types were placed in this encounter.   Recommend 150 minutes/week of aerobic exercise Low fat, low carb, high fiber diet recommended  Disposition:   FU after echo   Signed, Larae Grooms, MD  07/25/2022 9:21 AM    [facility] [address], [place], [place]  [address] Phone: [phone]; Fax: [phone]

## 2022-07-25 ENCOUNTER — Ambulatory Visit: Payer: Medicare Other | Admitting: Interventional Cardiology

## 2022-07-25 ENCOUNTER — Encounter: Payer: Self-pay | Admitting: Interventional Cardiology

## 2022-07-25 ENCOUNTER — Ambulatory Visit: Payer: Medicare Other | Attending: Interventional Cardiology | Admitting: Interventional Cardiology

## 2022-07-25 VITALS — BP 134/68 | HR 74 | Ht 75.0 in | Wt 301.0 lb

## 2022-07-25 DIAGNOSIS — I10 Essential (primary) hypertension: Secondary | ICD-10-CM

## 2022-07-25 DIAGNOSIS — R001 Bradycardia, unspecified: Secondary | ICD-10-CM

## 2022-07-25 DIAGNOSIS — I5022 Chronic systolic (congestive) heart failure: Secondary | ICD-10-CM

## 2022-07-25 DIAGNOSIS — I447 Left bundle-branch block, unspecified: Secondary | ICD-10-CM | POA: Diagnosis not present

## 2022-07-25 DIAGNOSIS — I4811 Longstanding persistent atrial fibrillation: Secondary | ICD-10-CM

## 2022-07-25 DIAGNOSIS — G4733 Obstructive sleep apnea (adult) (pediatric): Secondary | ICD-10-CM | POA: Diagnosis not present

## 2022-07-25 DIAGNOSIS — Z7901 Long term (current) use of anticoagulants: Secondary | ICD-10-CM

## 2022-07-25 NOTE — Patient Instructions (Addendum)
Medication Instructions:  Your physician recommends that you continue on your current medications as directed. Please refer to the Current Medication list given to you today.  *If you need a refill on your cardiac medications before your next appointment, please call your pharmacy*   Lab Work: none If you have labs (blood work) drawn today and your tests are completely normal, you will receive your results only by: [place] (if you have MyChart) OR A paper copy in the mail If you have any lab test that is abnormal or we need to change your treatment, we will call you to review the results.   Testing/Procedures: Your physician has requested that you have an echocardiogram. Echocardiography is a painless test that uses sound waves to create images of your heart. It provides your doctor with information about the size and shape of your heart and how well your heart's chambers and valves are working. This procedure takes approximately one hour. There are no restrictions for this procedure. Please do NOT wear cologne, perfume, aftershave, or lotions (deodorant is allowed). Please arrive 15 minutes prior to your appointment time.    Follow-Up: At [facility], you and your health needs are our priority.  As part of our continuing mission to provide you with exceptional heart care, we have created designated Provider Care Teams.  These Care Teams include your primary Cardiologist (physician) and Advanced Practice Providers (APPs -  Physician Assistants and Nurse Practitioners) who all work together to provide you with the care you need, when you need it.  We recommend signing up for the patient portal called "MyChart".  Sign up information is provided on this After Visit Summary.  MyChart is used to connect with patients for Virtual Visits (Telemedicine).  Patients are able to view lab/test results, encounter notes, upcoming appointments, etc.  Non-urgent messages can be sent to your  provider as well.   To learn more about what you can do with MyChart, go to [email or web address].    Your next appointment:   October 02, 2022  Provider:   Larae Grooms, MD     Other Instructions

## 2022-08-07 ENCOUNTER — Telehealth: Payer: Self-pay | Admitting: Internal Medicine

## 2022-08-07 NOTE — Telephone Encounter (Signed)
Pt would like a call back. He has questions on and. Please advise.RX that was approved

## 2022-08-10 ENCOUNTER — Other Ambulatory Visit: Payer: Self-pay

## 2022-08-10 DIAGNOSIS — E7849 Other hyperlipidemia: Secondary | ICD-10-CM

## 2022-08-10 DIAGNOSIS — I429 Cardiomyopathy, unspecified: Secondary | ICD-10-CM

## 2022-08-10 MED ORDER — WEGOVY 1 MG/0.5ML ~~LOC~~ SOAJ: 1.0000 mg | SUBCUTANEOUS | 1 refills | Status: DC

## 2022-08-10 MED ORDER — REPATHA 140 MG/ML ~~LOC~~ SOSY: 140.0000 mg | PREFILLED_SYRINGE | SUBCUTANEOUS | 11 refills | Status: DC

## 2022-08-10 NOTE — Telephone Encounter (Signed)
Spoke with patient.

## 2022-08-23 ENCOUNTER — Ambulatory Visit (HOSPITAL_COMMUNITY): Payer: Medicare Other | Attending: Cardiology

## 2022-08-23 ENCOUNTER — Telehealth: Payer: Self-pay | Admitting: Interventional Cardiology

## 2022-08-23 DIAGNOSIS — G4733 Obstructive sleep apnea (adult) (pediatric): Secondary | ICD-10-CM

## 2022-08-23 DIAGNOSIS — R001 Bradycardia, unspecified: Secondary | ICD-10-CM | POA: Diagnosis present

## 2022-08-23 DIAGNOSIS — Z7901 Long term (current) use of anticoagulants: Secondary | ICD-10-CM | POA: Diagnosis present

## 2022-08-23 DIAGNOSIS — I447 Left bundle-branch block, unspecified: Secondary | ICD-10-CM

## 2022-08-23 DIAGNOSIS — I5022 Chronic systolic (congestive) heart failure: Secondary | ICD-10-CM

## 2022-08-23 DIAGNOSIS — I10 Essential (primary) hypertension: Secondary | ICD-10-CM | POA: Diagnosis present

## 2022-08-23 DIAGNOSIS — I4811 Longstanding persistent atrial fibrillation: Secondary | ICD-10-CM

## 2022-08-23 LAB — ECHOCARDIOGRAM COMPLETE
Area-P 1/2: 3.87 cm2
S' Lateral: 4.9 cm

## 2022-08-23 NOTE — Telephone Encounter (Signed)
Pt states he would like to speak to "Will" again from his echo this morning. He states he told him a good doctor for Carpal tunnel and he forgot the doctor's name.

## 2022-09-01 ENCOUNTER — Telehealth: Payer: Self-pay | Admitting: Internal Medicine

## 2022-09-01 NOTE — Telephone Encounter (Signed)
Prescription Request  09/01/2022  LOV: 06/16/2022  What is the name of the medication or equipment? allopurinol (ZYLOPRIM) 100 MG tablet ,  Semaglutide-Weight Management (WEGOVY) 1 MG/0.5ML SOAJ   Evolocumab (REPATHA) 140 MG/ML SOSY   Have you contacted your pharmacy to request a refill? Yes   Which pharmacy would you like this sent to? [facility]   Address: [address], [place], [place] [address] Phone: [phone]  Patient notified that their request is being sent to the clinical staff for review and that they should receive a response within 2 business days.   Please advise at Mobile [phone] (mobile)

## 2022-09-04 ENCOUNTER — Other Ambulatory Visit: Payer: Self-pay

## 2022-09-07 ENCOUNTER — Other Ambulatory Visit: Payer: Self-pay

## 2022-09-07 DIAGNOSIS — M109 Gout, unspecified: Secondary | ICD-10-CM

## 2022-09-07 DIAGNOSIS — I429 Cardiomyopathy, unspecified: Secondary | ICD-10-CM

## 2022-09-07 DIAGNOSIS — E7849 Other hyperlipidemia: Secondary | ICD-10-CM

## 2022-09-07 MED ORDER — WEGOVY 1 MG/0.5ML ~~LOC~~ SOAJ: 1.0000 mg | SUBCUTANEOUS | 1 refills | Status: DC

## 2022-09-07 MED ORDER — ALLOPURINOL 100 MG PO TABS
100.0000 mg | ORAL_TABLET | Freq: Every day | ORAL | 6 refills | Status: DC
Start: 2022-09-07 — End: 2022-09-15

## 2022-09-07 MED ORDER — REPATHA 140 MG/ML ~~LOC~~ SOSY
140.0000 mg | PREFILLED_SYRINGE | SUBCUTANEOUS | 11 refills | Status: DC
Start: 2022-09-07 — End: 2022-09-15

## 2022-09-07 NOTE — Telephone Encounter (Signed)
Rx sent 

## 2022-09-11 NOTE — Telephone Encounter (Signed)
Patient states all of his RX's should be 90 day supplies. Switched Pharmacy to [facility] (except for any RX that is not a maintenance medication-which will remain [facility], [facility] in [place]).  Requests the following RX's be resent for  the medications listed below sent to the above Pharmacy with 90 day supply:    allopurinol (ZYLOPRIM) 100 MG tablet   Semaglutide-Weight Management (WEGOVY) 1 MG/0.5ML SOAJ   Evolocumab (REPATHA) 140 MG/ML SOSY   Requests to be called with any questions

## 2022-09-13 ENCOUNTER — Telehealth: Payer: Self-pay | Admitting: Interventional Cardiology

## 2022-09-13 NOTE — Telephone Encounter (Signed)
Patient states [facility] only sent Patient a 28 supply of Wegovy due to they have problems keeping Wegovy in stock.  Patient requests RX for [facility] be sent to:   [facility] [address] [place] - [id number] [place] [address] Phone: [phone]  Fax: [phone]      Also Patient states [facility] needs a RX for 90 day supply (states it must state 90 Day Supply) of  allopurinol (ZYLOPRIM) 100 MG tablet be sent to them    Also, [facility] needs a RX for 84 Day Supply of Repatha be sent to them.  Patient requests to be called with any questions.

## 2022-09-13 NOTE — Telephone Encounter (Signed)
Returned pt's call. Informed pt that I would forward his request to Dr. Eldridge Dace and Dennie Bible his RN, who are off of the office, for further instructions.

## 2022-09-13 NOTE — Telephone Encounter (Signed)
Pt stated he spoke with MD on 08/23/2022 about getting a R/L heart cath after going over his results but pt wanted to wait until after his wife had her surgery. Well, she's now completed her surgery and pt would like a callback regarding the next step to get him set up for the procedure. Please advise

## 2022-09-14 NOTE — Telephone Encounter (Signed)
Ok to set up L/R [facility] for LV dysfunction.

## 2022-09-14 NOTE — Telephone Encounter (Signed)
Patient needs updated H and P, labs and EKG.  I spoke with patient and made appointment for him to see Dr Eldridge Dace on May 20 at 9:00

## 2022-09-15 ENCOUNTER — Other Ambulatory Visit: Payer: Self-pay

## 2022-09-15 DIAGNOSIS — M109 Gout, unspecified: Secondary | ICD-10-CM

## 2022-09-15 DIAGNOSIS — E7849 Other hyperlipidemia: Secondary | ICD-10-CM

## 2022-09-15 DIAGNOSIS — I429 Cardiomyopathy, unspecified: Secondary | ICD-10-CM

## 2022-09-15 MED ORDER — WEGOVY 0.5 MG/0.5ML ~~LOC~~ SOAJ: 0.5000 mg | SUBCUTANEOUS | 0 refills | Status: DC

## 2022-09-15 MED ORDER — ALLOPURINOL 100 MG PO TABS
100.0000 mg | ORAL_TABLET | Freq: Every day | ORAL | 3 refills | Status: DC
Start: 2022-09-15 — End: 2022-10-11

## 2022-09-15 MED ORDER — REPATHA 140 MG/ML ~~LOC~~ SOSY
140.0000 mg | PREFILLED_SYRINGE | SUBCUTANEOUS | 11 refills | Status: DC
Start: 2022-09-15 — End: 2023-08-31

## 2022-09-15 NOTE — Telephone Encounter (Signed)
RX Sent. 

## 2022-09-17 NOTE — H&P (View-Only) (Signed)
  Cardiology Office Note   Date:  09/18/2022   ID:  Isaiah Jones, DOB 05/20/1950, MRN [id number]  PCP:  Morrison, Ryan G, MD    No chief complaint on file.  HFrEF, AFib  Wt Readings from Last 3 Encounters:  09/18/22 294 lb (133.4 kg)  07/25/22 (!) 301 lb (136.5 kg)  06/16/22 (!) 312 lb 3.2 oz (141.6 kg)       History of Present Illness: Isaiah Jones is a 72 y.o. male    with history of hyperlipidemia, hypertension and obesity. He also is treated for sleep apnea with CPAP. He was seen in our office back in March 2018 due to new onset atrial fibrillation. His pulmonologist found the irregular heartbeat. Atrial fibrillation was diagnosed. He has had some fatigue and intermittent palpitations over the past few months prior to diagnosis.   At his initial visit in our office in March 2018, he was started on Toprol-XL 50 mg daily for rate control, Xarelto 20 mg daily for stroke prevention. Cardioversion was considered as well. He was advised to lose weight.   Echocardiogram from March 2018 revealed: - Left ventricle: The cavity size was normal. Wall thickness was   increased in a pattern of mild LVH. Systolic function was normal.   The estimated ejection fraction was in the range of 55% to 60%.   Wall motion was normal; there were no regional wall motion   abnormalities. Doppler parameters are consistent with abnormal   left ventricular relaxation (grade 1 diastolic dysfunction). - Left atrium: The atrium was mildly dilated. Volume/bsa, S: 37.4   ml/m^2.   He had his cardioversion but it was not successful.  He has been maintained on rate control meds. He feels colder than he did before after starting anticoagulation.   He has had weight fluctuations.  He has some hip problems but was told he is too big for ortho surgery.  He was 375 lbs at that time.    At the last visit, he was considering weight loss surgery.  It occurred in 3/21.  Then he had COVID.  Lost 70 lbs so far  after surgery.   Hip replacement in 6/21.  Dr. Dalldorf.   Monitor showed in 12/2020: "Atrial fibrillation with average HR in the high 50s to low 70s range.  Pauses noted during sleeping hours. Frequent PVCs. "    In 3/23: "Rare, palpitations lasting just a few minutes.   Occasional leg edema which resolves by elevating legs."     EF in December 2023 showed decreased LVEF.  No angina at that time.  He was started on heart failure medication. Had admission to [facility] in 06/2022.  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  It was noted that A-fib persisted on the monitor.  Metoprolol has been stopped due to bradycardia but later restarted.  LVEF remained low Despite medical therapy.  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    BMI 45.0-49.9, adult (HCC) 03/09/2018   CHF (congestive heart failure) (HCC)    Colon polyps    Complication of anesthesia    Depression    Dyslipidemia    Dysrhythmia 2017   A fib   GERD (gastroesophageal reflux disease)    Glaucoma    early stage   History of 2019 novel coronavirus disease (COVID-19) 06/2019   History   of kidney stones    Hypertension    Hypertriglyceridemia 03/14/2022   Lab Results Component Value Date/Time  TRIG 154.0 (H) 03/13/2022 08:53 AM  TRIG 188.0 (H) 06/14/2021 09:38 AM  TRIG 203.0 (H) 06/11/2020 09:38 AM  TRIG 166.0 (H) 06/11/2019 09:44 AM  TRIG 177 (H) 02/18/2019 08:36 AM     OSA on CPAP    C-Pap   Persistent atrial fibrillation (HCC)    Echo 3/18: Mild LVH, EF 55-60, normal wall motion, grade 1 diastolic dysfunction, mild LAE   Personal history of gout 05/23/2019   Pneumonia    childhood   PONV (postoperative nausea and vomiting)    after 1st knee surgery only   Rhinitis, allergic     Past Surgical History:  Procedure Laterality Date   arthroscopic knee  surgery Bilateral 2003   CARDIOVERSION N/A 08/10/2016   Procedure: CARDIOVERSION;  Surgeon: Kenneth C Hilty, MD;  Location: [facility];  Service: Cardiovascular;  Laterality: N/A;   COLONOSCOPY     CYSTOSCOPY WITH RETROGRADE PYELOGRAM, URETEROSCOPY AND STENT PLACEMENT Left 03/04/2021   Procedure: CYSTOSCOPY WITH BILATERAL RETROGRADES,PYELOGRAM, URETEROSCOPY AND STENT PLACEMENT;  Surgeon: Manny, Theodore, MD;  Location: [facility];  Service: Urology;  Laterality: Left;  1 HR   ESOPHAGEAL MANOMETRY N/A 03/22/2022   Procedure: ESOPHAGEAL MANOMETRY (EM);  Surgeon: Dorsey, Ying C, MD;  Location: [facility];  Service: Gastroenterology;  Laterality: N/A;   HOLMIUM LASER APPLICATION Left 03/04/2021   Procedure: HOLMIUM LASER APPLICATION;  Surgeon: Manny, Theodore, MD;  Location: [facility];  Service: Urology;  Laterality: Left;   LAPAROSCOPIC GASTRIC SLEEVE RESECTION N/A 07/15/2019   Procedure: LAPAROSCOPIC GASTRIC SLEEVE RESECTION, Upper Endo, ERAS Pathway;  Surgeon: Martin, Matthew, MD;  Location: [facility];  Service: General;  Laterality: N/A;   REPLACEMENT TOTAL KNEE Bilateral 2008   TONSILLECTOMY     TOTAL HIP ARTHROPLASTY Right 10/21/2019   Procedure: RIGHT TOTAL HIP ARTHROPLASTY ANTERIOR APPROACH;  Surgeon: Dalldorf, Peter, MD;  Location: [facility];  Service: Orthopedics;  Laterality: Right;   VASECTOMY       Current Outpatient Medications  Medication Sig Dispense Refill   acetaminophen (TYLENOL) 500 MG tablet Take 500 mg by mouth every 6 (six) hours as needed (for pain.).     allopurinol (ZYLOPRIM) 100 MG tablet Take 1 tablet (100 mg total) by mouth daily. Dont start until steroids haved calmed down the attack, but do not stop if an attack returns. 90 tablet 3   amoxicillin (AMOXIL) 250 MG capsule Take 500 mg by mouth 3 (three) times daily. Prior to Dental     Azelastine HCl 137 MCG/SPRAY SOLN 2 sprays.     Calcium Carb-Cholecalciferol (CALCIUM-VITAMIN D) 600-400 MG-UNIT TABS Take 1 tablet by mouth in the  morning, at noon, and at bedtime.     Calcium Carbonate-Vit D-Min (CALCIUM 600+D PLUS MINERALS) 600-400 MG-UNIT TABS Take 1 each by mouth daily as needed.     diclofenac Sodium (VOLTAREN) 1 % GEL Apply 4 g topically 4 (four) times daily as needed. 100 g 3   diphenhydramine-acetaminophen (TYLENOL PM) 25-500 MG TABS tablet Take 2 tablets by mouth at bedtime.      Evolocumab (REPATHA) 140 MG/ML SOSY Inject 140 mg into the skin every 14 (fourteen) days. 2.1 mL 11   fexofenadine (ALLEGRA) 180 MG tablet Take 180 mg by mouth daily as needed for allergies or rhinitis.     Ginkgo 60 MG TABS Take 60 mg by mouth daily.     latanoprost (XALATAN) 0.005 %   ophthalmic solution Place 1 drop into both eyes at bedtime.      methocarbamol (ROBAXIN) 750 MG tablet Take 1 tablet (750 mg total) by mouth every 6 (six) hours as needed for muscle spasms. 40 tablet 1   metoprolol succinate (TOPROL XL) 25 MG 24 hr tablet Take 1.5 tablets (37.5 mg total) by mouth daily. (Patient taking differently: Take 12.5 mg by mouth daily.) 45 tablet 6   mometasone (NASONEX) 50 MCG/ACT nasal spray Place 1 spray into the nose at bedtime.     Multiple Vitamin (MULTIVITAMIN WITH MINERALS) TABS tablet Take 2 tablets by mouth daily. Bariatric Multivitamin     Multiple Vitamins-Minerals (AIRBORNE PO) Take 1 tablet by mouth daily as needed (immune health).      rosuvastatin (CRESTOR) 20 MG tablet TAKE 1 TABLET DAILY 90 tablet 3   sacubitril-valsartan (ENTRESTO) 49-51 MG Take 1 tablet by mouth 2 (two) times daily. 180 tablet 3   Semaglutide-Weight Management (WEGOVY) 1 MG/0.5ML SOAJ Inject 1 mg into the skin once a week. 2 mL 1   sertraline (ZOLOFT) 50 MG tablet TAKE 1 TABLET DAILY 90 tablet 1   spironolactone (ALDACTONE) 25 MG tablet Take 0.5 tablets (12.5 mg total) by mouth daily. 45 tablet 3   thiamine (VITAMIN B-1) 100 MG tablet Take 100 mg by mouth daily.     WEGOVY 0.5 MG/0.5ML SOAJ Inject 0.5 mg into the skin once a week. 2 mL 0   XARELTO  20 MG TABS tablet TAKE 1 TABLET DAILY WITH   SUPPER 90 tablet 1   No current facility-administered medications for this visit.    Allergies:   Erythromycin    Social History:  The patient  reports that he quit smoking about 21 years ago. His smoking use included cigarettes. He has never used smokeless tobacco. He reports current alcohol use. He reports that he does not use drugs.   Family History:  The patient's family history includes Alzheimer's disease in his mother; Arthritis in his father and mother; Esophageal cancer in his father; Heart attack in his mother; Heart attack (age of onset: 81) in his father; Hyperlipidemia in his brother, father, and mother; Hypertension in his brother, father, and mother; Other in his brother.    ROS:  Please see the history of present illness.   Otherwise, review of systems are positive for intentional weight loss.   All other systems are reviewed and negative.    PHYSICAL EXAM: VS:  BP 116/84   Pulse 73   Ht 6' 3" (1.905 m)   Wt 294 lb (133.4 kg)   SpO2 96%   BMI 36.75 kg/m  , BMI Body mass index is 36.75 kg/m. GEN: Well nourished, well developed, in no acute distress HEENT: normal Neck: no JVD, carotid bruits, or masses Cardiac: Irregularly irregular; no murmurs, rubs, or gallops,no edema  Respiratory:  clear to auscultation bilaterally, normal work of breathing GI: soft, nontender, nondistended, + BS MS: no deformity or atrophy Skin: warm and dry, no rash Neuro:  Strength and sensation are intact Psych: euthymic mood, full affect   EKG:   The ekg ordered today demonstrates atrial fibrillation, left bundle branch block, rate controlled   Recent Labs: 03/13/2022: ALT 21; Hemoglobin 16.7; Platelets 174.0; TSH 1.36 06/02/2022: BUN 17; Creatinine, Ser 0.92; Potassium 4.4; Sodium 141   Lipid Panel    Component Value Date/Time   CHOL 148 03/13/2022 0853   CHOL 133 02/18/2019 0836   TRIG 154.0 (H) 03/13/2022 0853   HDL   48.70  03/13/2022 0853   HDL 40 02/18/2019 0836   CHOLHDL 3 03/13/2022 0853   VLDL 30.8 03/13/2022 0853   LDLCALC 68 03/13/2022 0853   LDLCALC 63 02/18/2019 0836   LDLDIRECT 76.0 06/11/2020 0938     Other studies Reviewed: Additional studies/ records that were reviewed today with results demonstrating: LDL 68 in November 2023, creatinine 0.92 in February 2024.   ASSESSMENT AND PLAN:  HFrEF: Unclear etiology.  Needs left and right heart cath to further evaluate.  EF was normal at the initial time of his atrial fibrillation diagnosis.  He is agreeable to cardiac catheterization.  All questions about cardiac cath were answered. AFib: Rate controlled.  Normal rate.  Continue current medications. Hypertensive heart disease: Avoid excessive salt.  Avoid processed foods.  Blood pressure control. Anticoagulated: Will need to hold anticoagulation for cardiac cath. Morbid obesity: Whole food, plant-based diet. Wegovy. Hyperlipidemia: Continue Repatha.  LDL 68 in November 2023. Obstructive sleep apnea: Using CPAP.  The patient understands that risks include but are not limited to stroke (1 in 1000), death (1 in 1000), kidney failure [usually temporary] (1 in 500), bleeding (1 in 200), allergic reaction [possibly serious] (1 in 200), and agrees to proceed.    Current medicines are reviewed at length with the patient today.  The patient concerns regarding his medicines were addressed.  The following changes have been made:  No change  Labs/ tests ordered today include:   Orders Placed This Encounter  Procedures   Basic Metabolic Panel (BMET)   CBC   EKG 12-Lead    Recommend 150 minutes/week of aerobic exercise Low fat, low carb, high fiber diet recommended  Disposition:   FU post cath   Signed, Ilisha Blust, MD  09/18/2022 10:01 AM    [facility] [address], [place], [place]  [address] Phone: [phone]; Fax: [phone]   

## 2022-09-17 NOTE — Progress Notes (Unsigned)
Cardiology Office Note   Date:  09/18/2022   ID:  Isaiah Jones, Isaiah Jones 1950/09/03, MRN [id number]  PCP:  Lula Olszewski, MD    No chief complaint on file.  HFrEF, AFib  Wt Readings from Last 3 Encounters:  09/18/22 294 lb (133.4 kg)  07/25/22 (!) 301 lb (136.5 kg)  06/16/22 (!) 312 lb 3.2 oz (141.6 kg)       History of Present Illness: Isaiah Jones is a 72 y.o. male    with history of hyperlipidemia, hypertension and obesity. He also is treated for sleep apnea with CPAP. He was seen in our office back in March 2018 due to new onset atrial fibrillation. His pulmonologist found the irregular heartbeat. Atrial fibrillation was diagnosed. He has had some fatigue and intermittent palpitations over the past few months prior to diagnosis.   At his initial visit in our office in March 2018, he was started on Toprol-XL 50 mg daily for rate control, Xarelto 20 mg daily for stroke prevention. Cardioversion was considered as well. He was advised to lose weight.   Echocardiogram from March 2018 revealed: - Left ventricle: The cavity size was normal. Wall thickness was   increased in a pattern of mild LVH. Systolic function was normal.   The estimated ejection fraction was in the range of 55% to 60%.   Wall motion was normal; there were no regional wall motion   abnormalities. Doppler parameters are consistent with abnormal   left ventricular relaxation (grade 1 diastolic dysfunction). - Left atrium: The atrium was mildly dilated. Volume/bsa, S: 37.4   ml/m^2.   He had his cardioversion but it was not successful.  He has been maintained on rate control meds. He feels colder than he did before after starting anticoagulation.   He has had weight fluctuations.  He has some hip problems but was told he is too big for ortho surgery.  He was 375 lbs at that time.    At the last visit, he was considering weight loss surgery.  It occurred in 3/21.  Then he had COVID.  Lost 70 lbs so far  after surgery.   Hip replacement in 6/21.  Dr. Jerl Santos.   Monitor showed in 12/2020: "Atrial fibrillation with average HR in the high 50s to low 70s range.  Pauses noted during sleeping hours. Frequent PVCs. "    In 3/23: "Rare, palpitations lasting just a few minutes.   Occasional leg edema which resolves by elevating legs."     EF in December 2023 showed decreased LVEF.  No angina at that time.  He was started on heart failure medication. Had admission to [facility] in 06/2022.  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  It was noted that A-fib persisted on the monitor.  Metoprolol has been stopped due to bradycardia but later restarted.  LVEF remained low Despite medical therapy.  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    BMI 45.0-49.9, adult (HCC) 03/09/2018   CHF (congestive heart failure) (HCC)    Colon polyps    Complication of anesthesia    Depression    Dyslipidemia    Dysrhythmia 2017   A fib   GERD (gastroesophageal reflux disease)    Glaucoma    early stage   History of 2019 novel coronavirus disease (COVID-19) 06/2019   History  of kidney stones    Hypertension    Hypertriglyceridemia 03/14/2022   Lab Results Component Value Date/Time  TRIG 154.0 (H) 03/13/2022 08:53 AM  TRIG 188.0 (H) 06/14/2021 09:38 AM  TRIG 203.0 (H) 06/11/2020 09:38 AM  TRIG 166.0 (H) 06/11/2019 09:44 AM  TRIG 177 (H) 02/18/2019 08:36 AM     OSA on CPAP    C-Pap   Persistent atrial fibrillation (HCC)    Echo 3/18: Mild LVH, EF 55-60, normal wall motion, grade 1 diastolic dysfunction, mild LAE   Personal history of gout 05/23/2019   Pneumonia    childhood   PONV (postoperative nausea and vomiting)    after 1st knee surgery only   Rhinitis, allergic     Past Surgical History:  Procedure Laterality Date   arthroscopic knee  surgery Bilateral 2003   CARDIOVERSION N/A 08/10/2016   Procedure: CARDIOVERSION;  Surgeon: Chrystie Nose, MD;  Location: [facility] END[facility]COPY;  Service: Cardiovascular;  Laterality: N/A;   COLONOSCOPY     CYSTOSCOPY WITH RETROGRADE PYELOGRAM, URETEROSCOPY AND STENT PLACEMENT Left 03/04/2021   Procedure: CYSTOSCOPY WITH BILATERAL RETROGRADES,PYELOGRAM, URETEROSCOPY AND STENT PLACEMENT;  Surgeon: Sebastian Ache, MD;  Location: [facility];  Service: Urology;  Laterality: Left;  1 HR   ESOPHAGEAL MANOMETRY N/A 03/22/2022   Procedure: ESOPHAGEAL MANOMETRY (EM);  Surgeon: Imogene Burn, MD;  Location: [facility];  Service: Gastroenterology;  Laterality: N/A;   HOLMIUM LASER APPLICATION Left 03/04/2021   Procedure: HOLMIUM LASER APPLICATION;  Surgeon: Sebastian Ache, MD;  Location: [facility];  Service: Urology;  Laterality: Left;   LAPAROSCOPIC GASTRIC SLEEVE RESECTION N/A 07/15/2019   Procedure: LAPAROSCOPIC GASTRIC SLEEVE RESECTION, Upper Endo, ERAS Pathway;  Surgeon: Luretha Murphy, MD;  Location: [facility];  Service: General;  Laterality: N/A;   REPLACEMENT TOTAL KNEE Bilateral 2008   TONSILLECTOMY     TOTAL HIP ARTHROPLASTY Right 10/21/2019   Procedure: RIGHT TOTAL HIP ARTHROPLASTY ANTERIOR APPROACH;  Surgeon: Marcene Corning, MD;  Location: [facility];  Service: Orthopedics;  Laterality: Right;   VASECTOMY       Current Outpatient Medications  Medication Sig Dispense Refill   acetaminophen (TYLENOL) 500 MG tablet Take 500 mg by mouth every 6 (six) hours as needed (for pain.).     allopurinol (ZYLOPRIM) 100 MG tablet Take 1 tablet (100 mg total) by mouth daily. Dont start until steroids haved calmed down the attack, but do not stop if an attack returns. 90 tablet 3   amoxicillin (AMOXIL) 250 MG capsule Take 500 mg by mouth 3 (three) times daily. Prior to Dental     Azelastine HCl 137 MCG/SPRAY SOLN 2 sprays.     Calcium Carb-Cholecalciferol (CALCIUM-VITAMIN D) 600-400 MG-UNIT TABS Take 1 tablet by mouth in the  morning, at noon, and at bedtime.     Calcium Carbonate-Vit D-Min (CALCIUM 600+D PLUS MINERALS) 600-400 MG-UNIT TABS Take 1 each by mouth daily as needed.     diclofenac Sodium (VOLTAREN) 1 % GEL Apply 4 g topically 4 (four) times daily as needed. 100 g 3   diphenhydramine-acetaminophen (TYLENOL PM) 25-500 MG TABS tablet Take 2 tablets by mouth at bedtime.      Evolocumab (REPATHA) 140 MG/ML SOSY Inject 140 mg into the skin every 14 (fourteen) days. 2.1 mL 11   fexofenadine (ALLEGRA) 180 MG tablet Take 180 mg by mouth daily as needed for allergies or rhinitis.     Ginkgo 60 MG TABS Take 60 mg by mouth daily.     latanoprost (XALATAN) 0.005 %  ophthalmic solution Place 1 drop into both eyes at bedtime.      methocarbamol (ROBAXIN) 750 MG tablet Take 1 tablet (750 mg total) by mouth every 6 (six) hours as needed for muscle spasms. 40 tablet 1   metoprolol succinate (TOPROL XL) 25 MG 24 hr tablet Take 1.5 tablets (37.5 mg total) by mouth daily. (Patient taking differently: Take 12.5 mg by mouth daily.) 45 tablet 6   mometasone (NASONEX) 50 MCG/ACT nasal spray Place 1 spray into the nose at bedtime.     Multiple Vitamin (MULTIVITAMIN WITH MINERALS) TABS tablet Take 2 tablets by mouth daily. Bariatric Multivitamin     Multiple Vitamins-Minerals (AIRBORNE PO) Take 1 tablet by mouth daily as needed (immune health).      rosuvastatin (CRESTOR) 20 MG tablet TAKE 1 TABLET DAILY 90 tablet 3   sacubitril-valsartan (ENTRESTO) 49-51 MG Take 1 tablet by mouth 2 (two) times daily. 180 tablet 3   Semaglutide-Weight Management (WEGOVY) 1 MG/0.5ML SOAJ Inject 1 mg into the skin once a week. 2 mL 1   sertraline (ZOLOFT) 50 MG tablet TAKE 1 TABLET DAILY 90 tablet 1   spironolactone (ALDACTONE) 25 MG tablet Take 0.5 tablets (12.5 mg total) by mouth daily. 45 tablet 3   thiamine (VITAMIN B-1) 100 MG tablet Take 100 mg by mouth daily.     WEGOVY 0.5 MG/0.5ML SOAJ Inject 0.5 mg into the skin once a week. 2 mL 0   XARELTO  20 MG TABS tablet TAKE 1 TABLET DAILY WITH   SUPPER 90 tablet 1   No current facility-administered medications for this visit.    Allergies:   Erythromycin    Social History:  The patient  reports that he quit smoking about 21 years ago. His smoking use included cigarettes. He has never used smokeless tobacco. He reports current alcohol use. He reports that he does not use drugs.   Family History:  The patient's family history includes Alzheimer's disease in his mother; Arthritis in his father and mother; Esophageal cancer in his father; Heart attack in his mother; Heart attack (age of onset: 65) in his father; Hyperlipidemia in his brother, father, and mother; Hypertension in his brother, father, and mother; Other in his brother.    ROS:  Please see the history of present illness.   Otherwise, review of systems are positive for intentional weight loss.   All other systems are reviewed and negative.    PHYSICAL EXAM: VS:  BP 116/84   Pulse 73   Ht 6\' 3"  (1.905 m)   Wt 294 lb (133.4 kg)   SpO2 96%   BMI 36.75 kg/m  , BMI Body mass index is 36.75 kg/m. GEN: Well nourished, well developed, in no acute distress HEENT: normal Neck: no JVD, carotid bruits, or masses Cardiac: Irregularly irregular; no murmurs, rubs, or gallops,no edema  Respiratory:  clear to auscultation bilaterally, normal work of breathing GI: soft, nontender, nondistended, + BS MS: no deformity or atrophy Skin: warm and dry, no rash Neuro:  Strength and sensation are intact Psych: euthymic mood, full affect   EKG:   The ekg ordered today demonstrates atrial fibrillation, left bundle branch block, rate controlled   Recent Labs: 03/13/2022: ALT 21; Hemoglobin 16.7; Platelets 174.0; TSH 1.36 06/02/2022: BUN 17; Creatinine, Ser 0.92; Potassium 4.4; Sodium 141   Lipid Panel    Component Value Date/Time   CHOL 148 03/13/2022 0853   CHOL 133 02/18/2019 0836   TRIG 154.0 (H) 03/13/2022 0853   HDL  48.70  03/13/2022 0853   HDL 40 02/18/2019 0836   CHOLHDL 3 03/13/2022 0853   VLDL 30.8 03/13/2022 0853   LDLCALC 68 03/13/2022 0853   LDLCALC 63 02/18/2019 0836   LDLDIRECT 76.0 06/11/2020 0938     Other studies Reviewed: Additional studies/ records that were reviewed today with results demonstrating: LDL 68 in November 2023, creatinine 0.92 in February 2024.   ASSESSMENT AND PLAN:  HFrEF: Unclear etiology.  Needs left and right heart cath to further evaluate.  EF was normal at the initial time of his atrial fibrillation diagnosis.  He is agreeable to cardiac catheterization.  All questions about cardiac cath were answered. AFib: Rate controlled.  Normal rate.  Continue current medications. Hypertensive heart disease: Avoid excessive salt.  Avoid processed foods.  Blood pressure control. Anticoagulated: Will need to hold anticoagulation for cardiac cath. Morbid obesity: Whole food, plant-based diet. Wegovy. Hyperlipidemia: Continue Repatha.  LDL 68 in November 2023. Obstructive sleep apnea: Using CPAP.  The patient understands that risks include but are not limited to stroke (1 in 1000), death (1 in 1000), kidney failure [usually temporary] (1 in 500), bleeding (1 in 200), allergic reaction [possibly serious] (1 in 200), and agrees to proceed.    Current medicines are reviewed at length with the patient today.  The patient concerns regarding his medicines were addressed.  The following changes have been made:  No change  Labs/ tests ordered today include:   Orders Placed This Encounter  Procedures   Basic Metabolic Panel (BMET)   CBC   EKG 12-Lead    Recommend 150 minutes/week of aerobic exercise Low fat, low carb, high fiber diet recommended  Disposition:   FU post cath   Signed, Lance Muss, MD  09/18/2022 10:01 AM    [facility] [address] [place], [place], [place]  [address] Phone: [phone]; Fax: [phone]

## 2022-09-18 ENCOUNTER — Encounter: Payer: Self-pay | Admitting: Interventional Cardiology

## 2022-09-18 ENCOUNTER — Ambulatory Visit: Payer: Medicare Other | Attending: Interventional Cardiology | Admitting: Interventional Cardiology

## 2022-09-18 VITALS — BP 116/84 | HR 73 | Ht 75.0 in | Wt 294.0 lb

## 2022-09-18 DIAGNOSIS — Z7901 Long term (current) use of anticoagulants: Secondary | ICD-10-CM

## 2022-09-18 DIAGNOSIS — G4733 Obstructive sleep apnea (adult) (pediatric): Secondary | ICD-10-CM | POA: Diagnosis not present

## 2022-09-18 DIAGNOSIS — I5022 Chronic systolic (congestive) heart failure: Secondary | ICD-10-CM | POA: Diagnosis not present

## 2022-09-18 DIAGNOSIS — I447 Left bundle-branch block, unspecified: Secondary | ICD-10-CM

## 2022-09-18 DIAGNOSIS — I10 Essential (primary) hypertension: Secondary | ICD-10-CM | POA: Diagnosis not present

## 2022-09-18 DIAGNOSIS — I4811 Longstanding persistent atrial fibrillation: Secondary | ICD-10-CM

## 2022-09-18 NOTE — Patient Instructions (Addendum)
Medication Instructions:  Your physician recommends that you continue on your current medications as directed. Please refer to the Current Medication list given to you today.  *If you need a refill on your cardiac medications before your next appointment, please call your pharmacy*   Lab Work: Lab work to be done today--BMP and CBC If you have labs (blood work) drawn today and your tests are completely normal, you will receive your results only by: MyChart Message (if you have MyChart) OR A paper copy in the mail If you have any lab test that is abnormal or we need to change your treatment, we will call you to review the results.   Testing/Procedures: Your physician has requested that you have a cardiac catheterization. Cardiac catheterization is used to diagnose and/or treat various heart conditions. Doctors may recommend this procedure for a number of different reasons. The most common reason is to evaluate chest pain. Chest pain can be a symptom of coronary artery disease (CAD), and cardiac catheterization can show whether plaque is narrowing or blocking your heart's arteries. This procedure is also used to evaluate the valves, as well as measure the blood flow and oxygen levels in different parts of your heart. For further information please visit [email or web address] Please follow instruction sheet, as given. Scheduled for May 24   Follow-Up: At [facility], you and your health needs are our priority.  As part of our continuing mission to provide you with exceptional heart care, we have created designated Provider Care Teams.  These Care Teams include your primary Cardiologist (physician) and Advanced Practice Providers (APPs -  Physician Assistants and Nurse Practitioners) who all work together to provide you with the care you need, when you need it.  We recommend signing up for the patient portal called "MyChart".  Sign up information is provided on this After Visit Summary.   MyChart is used to connect with patients for Virtual Visits (Telemedicine).  Patients are able to view lab/test results, encounter notes, upcoming appointments, etc.  Non-urgent messages can be sent to your provider as well.   To learn more about what you can do with MyChart, go to [email or web address].    Your next appointment:   June 3  Provider:   Lance Muss, MD     Other Instructions  [facility] A DEPT OF [place]. [facility] AT [facility] [address] [place], [place] [address] [id number] [facility] [place] [address] Dept: [phone] Loc: [phone]  THIMOTHY REICKS  09/18/2022  You are scheduled for a Cardiac Catheterization on Friday, May 24 with Dr. Lance Muss.  1. Please arrive at the [facility] (Main Entrance A) at [facility]: [address] [place], [place] [address] at 5:30 AM (This time is 2 hour(s) before your procedure to ensure your preparation). Free valet parking service is available. You will check in at ADMITTING. The support person will be asked to wait in the waiting room.  It is OK to have someone drop you off and come back when you are ready to be discharged.    Special note: Every effort is made to have your procedure done on time. Please understand that emergencies sometimes delay scheduled procedures.  2. Diet: Do not eat solid foods after midnight.  The patient may have clear liquids until 5am upon the day of the procedure.  3. Labs: done in office on May 20  4. Medication instructions in preparation for your procedure:   Contrast Allergy:  No  Stop taking Xarelto (Rivaroxaban) on Tuesday, May 21.  Your last dose prior to procedure will be May 21  Do not take any diabetes medication the morning of the procedure  On the morning of your procedure, take Aspirin 81 mg and any morning medicines NOT listed above.  You may use sips of water.  5. Plan to go home the same day, you will only stay  overnight if medically necessary. 6. Bring a current list of your medications and current insurance cards. 7. You MUST have a responsible person to drive you home. 8. Someone MUST be with you the first 24 hours after you arrive home or your discharge will be delayed. 9. Please wear clothes that are easy to get on and off and wear slip-on shoes.  Thank you for allowing [place] to care for you!   -- [facility]

## 2022-09-19 LAB — BASIC METABOLIC PANEL
BUN/Creatinine Ratio: 13 (ref 10–24)
BUN: 13 mg/dL (ref 8–27)
CO2: 23 mmol/L (ref 20–29)
Calcium: 9.6 mg/dL (ref 8.6–10.2)
Chloride: 103 mmol/L (ref 96–106)
Creatinine, Ser: 0.99 mg/dL (ref 0.76–1.27)
Glucose: 80 mg/dL (ref 70–99)
Potassium: 4.3 mmol/L (ref 3.5–5.2)
Sodium: 141 mmol/L (ref 134–144)
eGFR: 80 mL/min/{1.73_m2} (ref >59)

## 2022-09-19 LAB — CBC
Hematocrit: 48.6 % (ref 37.5–51.0)
Hemoglobin: 16.2 g/dL (ref 13.0–17.7)
MCH: 32.9 pg (ref 26.6–33.0)
MCHC: 33.3 g/dL (ref 31.5–35.7)
MCV: 99 fL — ABNORMAL HIGH (ref 79–97)
Platelets: 179 10*3/uL (ref 150–450)
RBC: 4.93 x10E6/uL (ref 4.14–5.80)
RDW: 12.9 % (ref 11.6–15.4)
WBC: 6 10*3/uL (ref 3.4–10.8)

## 2022-09-20 ENCOUNTER — Telehealth: Payer: Self-pay | Admitting: *Deleted

## 2022-09-20 NOTE — Telephone Encounter (Addendum)
Cardiac Catheterization scheduled at [facility] for: Friday Sep 22, 2022 7:30 AM Arrival time [facility] Main Entrance A at: 5:30 AM  Nothing to eat after midnight prior to procedure, clear liquids until 5 AM day of procedure.  Medication instructions: -Hold:  Xarelto-none 09/20/22 until post procedure  Spironolactone-AM of procedure Wegovy-weekly on Thursdays-will hold 09/21/22 until post procedure -Other usual morning medications can be taken with sips of water including aspirin 81 mg.  Confirmed patient has responsible adult to drive home post procedure and be with patient first 24 hours after arriving home.  Plan to go home the same day, you will only stay overnight if medically necessary.  Reviewed procedure instructions with patient.

## 2022-09-21 ENCOUNTER — Other Ambulatory Visit: Payer: Self-pay | Admitting: Internal Medicine

## 2022-09-21 DIAGNOSIS — I5022 Chronic systolic (congestive) heart failure: Secondary | ICD-10-CM

## 2022-09-21 NOTE — Telephone Encounter (Signed)
Spoke with patient, he is aware we have received authorization for cardiac cath tomorrow. Auth# [id number] Exp - 11/05/22

## 2022-09-21 NOTE — Telephone Encounter (Signed)
Patient is requesting to speak with Thurston Hole, RN again in regards to procedure and coverage. Requesting return call.

## 2022-09-22 ENCOUNTER — Ambulatory Visit (HOSPITAL_COMMUNITY)
Admission: RE | Admit: 2022-09-22 | Discharge: 2022-09-22 | Disposition: A | Discharge status: Home / Self Care | Payer: Medicare Other | Attending: Interventional Cardiology | Admitting: Interventional Cardiology

## 2022-09-22 ENCOUNTER — Encounter (HOSPITAL_COMMUNITY)
Admission: RE | Disposition: A | Discharge status: Home / Self Care | Payer: Self-pay | Source: Home / Self Care | Attending: Interventional Cardiology

## 2022-09-22 DIAGNOSIS — G4733 Obstructive sleep apnea (adult) (pediatric): Secondary | ICD-10-CM | POA: Insufficient documentation

## 2022-09-22 DIAGNOSIS — I5020 Unspecified systolic (congestive) heart failure: Secondary | ICD-10-CM | POA: Insufficient documentation

## 2022-09-22 DIAGNOSIS — I4819 Other persistent atrial fibrillation: Secondary | ICD-10-CM

## 2022-09-22 DIAGNOSIS — Z6836 Body mass index (BMI) 36.0-36.9, adult: Secondary | ICD-10-CM | POA: Insufficient documentation

## 2022-09-22 DIAGNOSIS — Z87891 Personal history of nicotine dependence: Secondary | ICD-10-CM | POA: Diagnosis not present

## 2022-09-22 DIAGNOSIS — I2510 Atherosclerotic heart disease of native coronary artery without angina pectoris: Secondary | ICD-10-CM

## 2022-09-22 DIAGNOSIS — Z8616 Personal history of COVID-19: Secondary | ICD-10-CM | POA: Insufficient documentation

## 2022-09-22 DIAGNOSIS — I447 Left bundle-branch block, unspecified: Secondary | ICD-10-CM | POA: Insufficient documentation

## 2022-09-22 DIAGNOSIS — I110 Hypertensive heart disease with heart failure: Secondary | ICD-10-CM | POA: Insufficient documentation

## 2022-09-22 DIAGNOSIS — I5021 Acute systolic (congestive) heart failure: Secondary | ICD-10-CM | POA: Diagnosis not present

## 2022-09-22 DIAGNOSIS — Z7901 Long term (current) use of anticoagulants: Secondary | ICD-10-CM | POA: Diagnosis not present

## 2022-09-22 DIAGNOSIS — I4891 Unspecified atrial fibrillation: Secondary | ICD-10-CM | POA: Diagnosis not present

## 2022-09-22 HISTORY — PX: RIGHT/LEFT HEART CATH AND CORONARY ANGIOGRAPHY: CATH118266

## 2022-09-22 HISTORY — PX: CORONARY PRESSURE/FFR STUDY: CATH118243

## 2022-09-22 LAB — POCT I-STAT EG7
Acid-Base Excess: 1 mmol/L (ref 0.0–2.0)
Acid-Base Excess: 0 mmol/L (ref 0.0–2.0)
Bicarbonate: 26.4 mmol/L (ref 20.0–28.0)
Bicarbonate: 25.9 mmol/L (ref 20.0–28.0)
Calcium, Ion: 1.34 mmol/L (ref 1.15–1.40)
Calcium, Ion: 1.35 mmol/L (ref 1.15–1.40)
HCT: 45 % (ref 39.0–52.0)
HCT: 45 % (ref 39.0–52.0)
Hemoglobin: 15.3 g/dL (ref 13.0–17.0)
Hemoglobin: 15.3 g/dL (ref 13.0–17.0)
O2 Saturation: 71 %
O2 Saturation: 72 %
Potassium: 3.7 mmol/L (ref 3.5–5.1)
Potassium: 3.7 mmol/L (ref 3.5–5.1)
Sodium: 140 mmol/L (ref 135–145)
Sodium: 140 mmol/L (ref 135–145)
TCO2: 28 mmol/L (ref 22–32)
TCO2: 27 mmol/L (ref 22–32)
pCO2, Ven: 45.8 mmHg (ref 44–60)
pCO2, Ven: 44.9 mmHg (ref 44–60)
pH, Ven: 7.37 (ref 7.25–7.43)
pH, Ven: 7.369 (ref 7.25–7.43)
pO2, Ven: 39 mmHg (ref 32–45)
pO2, Ven: 40 mmHg (ref 32–45)

## 2022-09-22 LAB — POCT I-STAT 7, (LYTES, BLD GAS, ICA,H+H)
Acid-Base Excess: 0 mmol/L (ref 0.0–2.0)
Bicarbonate: 25.3 mmol/L (ref 20.0–28.0)
Calcium, Ion: 1.31 mmol/L (ref 1.15–1.40)
HCT: 46 % (ref 39.0–52.0)
Hemoglobin: 15.6 g/dL (ref 13.0–17.0)
O2 Saturation: 91 %
Potassium: 3.7 mmol/L (ref 3.5–5.1)
Sodium: 140 mmol/L (ref 135–145)
TCO2: 27 mmol/L (ref 22–32)
pCO2 arterial: 43.7 mmHg (ref 32–48)
pH, Arterial: 7.37 (ref 7.35–7.45)
pO2, Arterial: 63 mmHg — ABNORMAL LOW (ref 83–108)

## 2022-09-22 LAB — POCT ACTIVATED CLOTTING TIME: Activated Clotting Time: 239 seconds

## 2022-09-22 SURGERY — RIGHT/LEFT HEART CATH AND CORONARY ANGIOGRAPHY
Anesthesia: LOCAL

## 2022-09-22 MED ORDER — SODIUM CHLORIDE 0.9 % IV SOLN: INTRAVENOUS | Status: DC

## 2022-09-22 MED ORDER — HEPARIN (PORCINE) IN NACL 1000-0.9 UT/500ML-% IV SOLN
INTRAVENOUS | Status: DC | PRN
  Administered 2022-09-22 (×2): 500 mL

## 2022-09-22 MED ORDER — VERAPAMIL HCL 2.5 MG/ML IV SOLN
INTRAVENOUS | Status: DC | PRN
  Administered 2022-09-22: 10 mL via INTRA_ARTERIAL

## 2022-09-22 MED ORDER — MIDAZOLAM HCL 2 MG/2ML IJ SOLN
INTRAMUSCULAR | Status: AC
  Filled 2022-09-22: qty 2

## 2022-09-22 MED ORDER — ASPIRIN 81 MG PO CHEW: 81.0000 mg | CHEWABLE_TABLET | ORAL | Status: AC

## 2022-09-22 MED ORDER — VERAPAMIL HCL 2.5 MG/ML IV SOLN
INTRAVENOUS | Status: AC
  Filled 2022-09-22: qty 2

## 2022-09-22 MED ORDER — MIDAZOLAM HCL 2 MG/2ML IJ SOLN
INTRAMUSCULAR | Status: DC | PRN
  Administered 2022-09-22: 2 mg via INTRAVENOUS
  Administered 2022-09-22: 1 mg via INTRAVENOUS

## 2022-09-22 MED ORDER — HEPARIN SODIUM (PORCINE) 1000 UNIT/ML IJ SOLN
INTRAMUSCULAR | Status: AC
  Filled 2022-09-22: qty 10

## 2022-09-22 MED ORDER — SODIUM CHLORIDE 0.9% FLUSH: 3.0000 mL | INTRAVENOUS | Status: DC | PRN

## 2022-09-22 MED ORDER — RIVAROXABAN 20 MG PO TABS
20.0000 mg | ORAL_TABLET | Freq: Every day | ORAL | 1 refills | Status: DC
Start: 2022-09-23 — End: 2022-11-13

## 2022-09-22 MED ORDER — LIDOCAINE HCL (PF) 1 % IJ SOLN
INTRAMUSCULAR | Status: AC
  Filled 2022-09-22: qty 30

## 2022-09-22 MED ORDER — SODIUM CHLORIDE 0.9% FLUSH: 3.0000 mL | Freq: Two times a day (BID) | INTRAVENOUS | Status: DC

## 2022-09-22 MED ORDER — LABETALOL HCL 5 MG/ML IV SOLN: 10.0000 mg | INTRAVENOUS | Status: DC | PRN

## 2022-09-22 MED ORDER — HEPARIN SODIUM (PORCINE) 1000 UNIT/ML IJ SOLN
INTRAMUSCULAR | Status: DC | PRN
  Administered 2022-09-22: 6000 [IU] via INTRAVENOUS
  Administered 2022-09-22: 5000 [IU] via INTRAVENOUS

## 2022-09-22 MED ORDER — SODIUM CHLORIDE 0.9 % IV SOLN: 250.0000 mL | INTRAVENOUS | Status: DC | PRN

## 2022-09-22 MED ORDER — LIDOCAINE HCL (PF) 1 % IJ SOLN
INTRAMUSCULAR | Status: DC | PRN
  Administered 2022-09-22 (×2): 2 mL

## 2022-09-22 MED ORDER — ONDANSETRON HCL 4 MG/2ML IJ SOLN: 4.0000 mg | Freq: Four times a day (QID) | INTRAMUSCULAR | Status: DC | PRN

## 2022-09-22 MED ORDER — FENTANYL CITRATE (PF) 100 MCG/2ML IJ SOLN
INTRAMUSCULAR | Status: AC
  Filled 2022-09-22: qty 2

## 2022-09-22 MED ORDER — HYDRALAZINE HCL 20 MG/ML IJ SOLN: 10.0000 mg | INTRAMUSCULAR | Status: DC | PRN

## 2022-09-22 MED ORDER — ACETAMINOPHEN 325 MG PO TABS: 650.0000 mg | ORAL_TABLET | ORAL | Status: DC | PRN

## 2022-09-22 MED ORDER — IOHEXOL 350 MG/ML SOLN
INTRAVENOUS | Status: DC | PRN
  Administered 2022-09-22: 80 mL

## 2022-09-22 MED ORDER — FENTANYL CITRATE (PF) 100 MCG/2ML IJ SOLN
INTRAMUSCULAR | Status: DC | PRN
  Administered 2022-09-22 (×2): 25 ug via INTRAVENOUS

## 2022-09-22 SURGICAL SUPPLY — 15 items
BAND CMPR LRG ZPHR (HEMOSTASIS) ×1
BAND ZEPHYR COMPRESS 30 LONG (HEMOSTASIS) IMPLANT
CATH 5FR JL3.5 JR4 ANG PIG MP (CATHETERS) IMPLANT
CATH BALLN WEDGE 5F 110CM (CATHETERS) IMPLANT
CATH LAUNCHER 6FR JR4 (CATHETERS) IMPLANT
GLIDESHEATH SLEND SS 6F .021 (SHEATH) IMPLANT
GUIDEWIRE INQWIRE 1.5J.035X260 (WIRE) IMPLANT
GUIDEWIRE PRESSURE X 175 (WIRE) IMPLANT
INQWIRE 1.5J .035X260CM (WIRE) ×1
KIT ESSENTIALS PG (KITS) IMPLANT
KIT HEART LEFT (KITS) ×1 IMPLANT
PACK CARDIAC CATHETERIZATION (CUSTOM PROCEDURE TRAY) ×1 IMPLANT
SHEATH GLIDE SLENDER 4/5FR (SHEATH) IMPLANT
TRANSDUCER W/STOPCOCK (MISCELLANEOUS) ×1 IMPLANT
TUBING CIL FLEX 10 FLL-RA (TUBING) ×1 IMPLANT

## 2022-09-22 NOTE — Interval H&P Note (Signed)
Cath Lab Visit (complete for each Cath Lab visit)  Clinical Evaluation Leading to the Procedure:   ACS: Yes.    Non-ACS:    Anginal Classification: CCS III  Anti-ischemic medical therapy: Minimal Therapy (1 class of medications)  Non-Invasive Test Results: High-risk stress test findings: cardiac mortality >3%/year  Prior CABG: No previous CABG      History and Physical Interval Note:  09/22/2022 8:18 AM  Isaiah Jones  has presented today for surgery, with the diagnosis of afib.  The various methods of treatment have been discussed with the patient and family. After consideration of risks, benefits and other options for treatment, the patient has consented to  Procedure(s): RIGHT/LEFT HEART CATH AND CORONARY ANGIOGRAPHY (N/A) as a surgical intervention.  The patient's history has been reviewed, patient examined, no change in status, stable for surgery.  I have reviewed the patient's chart and labs.  Questions were answered to the patient's satisfaction.     Lance Muss

## 2022-09-26 ENCOUNTER — Encounter (HOSPITAL_COMMUNITY): Payer: Self-pay | Admitting: Interventional Cardiology

## 2022-09-26 ENCOUNTER — Telehealth: Payer: Self-pay | Admitting: Interventional Cardiology

## 2022-09-26 NOTE — Telephone Encounter (Signed)
Pt c/o medication issue:  1. Name of Medication: aspirin 81mg   2. How are you currently taking this medication (dosage and times per day)?   3. Are you having a reaction (difficulty breathing--STAT)?   4. What is your medication issue? Patient called wanting to know if he is to start taking aspirin daily or was this only for the morning of his procedure.   He also wants to know if he can start riding his bike today, he states he had a heart cath done on Friday morning.

## 2022-09-27 ENCOUNTER — Telehealth: Payer: Self-pay | Admitting: Internal Medicine

## 2022-09-27 NOTE — Telephone Encounter (Signed)
Pt states he is having a gout flair up and would like an RX. Please advise.

## 2022-09-27 NOTE — Telephone Encounter (Signed)
Pt called again and needs a call asap °

## 2022-09-27 NOTE — Telephone Encounter (Signed)
Patient notified

## 2022-09-27 NOTE — Telephone Encounter (Signed)
Since he is on Xarelto, he does not need aspirin daily.    OK to start riding his bike.

## 2022-09-28 NOTE — Telephone Encounter (Signed)
FYI ,  I called pt with message below, pt states he went to an UC in [place] yesterday and was given prednisone. Pt states he is doing better and will keep [place] updated. No appointment needed at this time.

## 2022-10-02 ENCOUNTER — Ambulatory Visit: Payer: Medicare Other | Admitting: Internal Medicine

## 2022-10-02 ENCOUNTER — Encounter: Payer: Self-pay | Admitting: Internal Medicine

## 2022-10-02 ENCOUNTER — Encounter: Payer: Self-pay | Admitting: Interventional Cardiology

## 2022-10-02 ENCOUNTER — Ambulatory Visit: Payer: Medicare Other | Attending: Interventional Cardiology | Admitting: Interventional Cardiology

## 2022-10-02 VITALS — BP 120/72 | HR 54 | Ht 75.0 in | Wt 289.0 lb

## 2022-10-02 VITALS — BP 124/82 | HR 63 | Temp 97.6°F | Ht 75.0 in | Wt 293.2 lb

## 2022-10-02 DIAGNOSIS — I447 Left bundle-branch block, unspecified: Secondary | ICD-10-CM

## 2022-10-02 DIAGNOSIS — M109 Gout, unspecified: Secondary | ICD-10-CM | POA: Diagnosis not present

## 2022-10-02 DIAGNOSIS — I5022 Chronic systolic (congestive) heart failure: Secondary | ICD-10-CM

## 2022-10-02 DIAGNOSIS — I4819 Other persistent atrial fibrillation: Secondary | ICD-10-CM | POA: Diagnosis not present

## 2022-10-02 DIAGNOSIS — I10 Essential (primary) hypertension: Secondary | ICD-10-CM

## 2022-10-02 DIAGNOSIS — Z7901 Long term (current) use of anticoagulants: Secondary | ICD-10-CM

## 2022-10-02 DIAGNOSIS — G4733 Obstructive sleep apnea (adult) (pediatric): Secondary | ICD-10-CM | POA: Diagnosis not present

## 2022-10-02 MED ORDER — COLCHICINE 0.6 MG PO TABS
0.6000 mg | ORAL_TABLET | Freq: Every day | ORAL | 3 refills | Status: DC
Start: 2022-10-02 — End: 2024-03-04

## 2022-10-02 MED ORDER — PREDNISONE 20 MG PO TABS
ORAL_TABLET | ORAL | 0 refills | Status: DC
Start: 2022-10-02 — End: 2022-10-11

## 2022-10-02 MED ORDER — KETOROLAC TROMETHAMINE 60 MG/2ML IM SOLN
60.0000 mg | Freq: Once | INTRAMUSCULAR | Status: AC
Start: 2022-10-02 — End: 2022-10-02
  Administered 2022-10-02: 60 mg via INTRAMUSCULAR

## 2022-10-02 MED ORDER — METOPROLOL SUCCINATE ER 25 MG PO TB24: 25.0000 mg | ORAL_TABLET | Freq: Every day | ORAL | 3 refills | Status: DC

## 2022-10-02 MED ORDER — HYDROCODONE-ACETAMINOPHEN 5-325 MG PO TABS
1.0000 | ORAL_TABLET | Freq: Four times a day (QID) | ORAL | 0 refills | Status: DC | PRN
Start: 2022-10-02 — End: 2022-10-11

## 2022-10-02 NOTE — Patient Instructions (Addendum)
Medication Instructions:  Your physician has recommended you make the following change in your medication: Increase Metoprolol Succinate to 25 mg by mouth daily  *If you need a refill on your cardiac medications before your next appointment, please call your pharmacy*   Lab Work: none If you have labs (blood work) drawn today and your tests are completely normal, you will receive your results only by: MyChart Message (if you have MyChart) OR A paper copy in the mail If you have any lab test that is abnormal or we need to change your treatment, we will call you to review the results.   Testing/Procedures: none   Follow-Up: At [facility], you and your health needs are our priority.  As part of our continuing mission to provide you with exceptional heart care, we have created designated Provider Care Teams.  These Care Teams include your primary Cardiologist (physician) and Advanced Practice Providers (APPs -  Physician Assistants and Nurse Practitioners) who all work together to provide you with the care you need, when you need it.  We recommend signing up for the patient portal called "MyChart".  Sign up information is provided on this After Visit Summary.  MyChart is used to connect with patients for Virtual Visits (Telemedicine).  Patients are able to view lab/test results, encounter notes, upcoming appointments, etc.  Non-urgent messages can be sent to your provider as well.   To learn more about what you can do with MyChart, go to [email or web address].    Your next appointment:   December 3 at 8:40  Provider:   Lance Muss, MD     Other Instructions

## 2022-10-02 NOTE — Assessment & Plan Note (Addendum)
Call back for rheumatology, podiatry, additional medications that require prior authorization, if this plan doesn't work.  Gout Flare: Severe gout flare of the left foot, failed prednisone. No signs of infection or open wounds. Patient has been on Allopurinol 100mg  long term. Discussed the risks of NSAIDs with colchicine and the need for stomach protection and hydration. -Continue prednisone, extend course. -Start colchicine, avoid NSAIDs. -Consider referral to podiatry for direct steroid injection if pain persists. -Consider referral to rheumatology and potential use of new gout medication if pain continues long term. -Provide gout diet handout, advise on low purine diet and avoidance of alcohol. -Check urine color, ensure hydration. -Follow-up in 1 week due to potential risks of treatment. Will need to increase allopurinol, check kidneys possibly  Pain Management: Discussed the use of narcotics for pain control if all else fails. Patient has previously used marijuana for pain control. -Provide Vicodin for use in desperate situations only. -Advise on risks of addiction.

## 2022-10-02 NOTE — Progress Notes (Signed)
[facility]: [phone]   Routine Medical Office Visit  Patient:  Isaiah Jones      Age: 72 y.o.       Sex:  male  Date:   10/02/2022 PCP:    Lula Olszewski, MD   Today's Healthcare Provider: Lula Olszewski, MD   Assessment and Plan:   Brenden was seen today for gout follow-up.  Gout, unspecified cause, unspecified chronicity, unspecified site Overview: Frequent attacks of gout for many years  Trying to avoid pills, so not taking prescribed allopurinol Avoiding beer now.  Assessment & Plan: Call back for rheumatology, podiatry, additional medications that require prior authorization, if this plan doesn't work.  Gout Flare: Severe gout flare of the left foot, failed prednisone. No signs of infection or open wounds. Patient has been on Allopurinol 100mg  long term. Discussed the risks of NSAIDs with colchicine and the need for stomach protection and hydration. -Continue prednisone, extend course. -Start colchicine, avoid NSAIDs. -Consider referral to podiatry for direct steroid injection if pain persists. -Consider referral to rheumatology and potential use of new gout medication if pain continues long term. -Provide gout diet handout, advise on low purine diet and avoidance of alcohol. -Check urine color, ensure hydration. -Follow-up in 1 week due to potential risks of treatment. Will need to increase allopurinol, check kidneys possibly  Pain Management: Discussed the use of narcotics for pain control if all else fails. Patient has previously used marijuana for pain control. -Provide Vicodin for use in desperate situations only. -Advise on risks of addiction.    Orders: -     Colchicine; Take 1 tablet (0.6 mg total) by mouth daily.  Dispense: 90 tablet; Refill: 3 -     predniSONE; Take 2 pills for 3 days, 1 pill for 4 days  Dispense: 10 tablet; Refill: 0 -     HYDROcodone-Acetaminophen; Take 1-2 tablets by mouth every 6 (six) hours as needed for  moderate pain or severe pain.  Dispense: 20 tablet; Refill: 0 -     Ketorolac Tromethamine    Treatment plan discussed and reviewed in detail. Explained medication safety and potential side effects.  Answered all patient questions and confirmed understanding and comfort with the plan. Encouraged patient to contact our office if they have any questions or concerns. Agreed on patient returning to office if symptoms worsen, persist, or new symptoms develop. Discussed precautions in case of needing to visit the Emergency Department.         Clinical Presentation:    72 y.o. male here today for Gout follow-up  Based on [employer] AI conversational text extraction:  Discussed the use of AI scribe software for clinical note transcription with the patient, who gave verbal consent to proceed.  History of Present Illness   The patient, with a history of gout, presents with a severe flare of the left foot. Despite a course of prednisone, there has been no significant improvement. The patient has been compliant with long-term allopurinol 100 mg therapy. He has never tried colchicine before. The patient denies any recent cuts or open wounds on the affected foot. He is not diabetic.  The patient reports a recent dietary intake of beer and shrimp, which he believes may have triggered the current gout flare. He has been drinking plenty of water and taking bariatric vitamins twice daily. The patient also reports a history of heart failure but is currently not experiencing any symptoms related to this condition.  The patient has previously experienced  gout flares, but none as severe as the current episode. He expresses concern about the potential for the gout crystals to affect his kidneys. The patient has been using ice packs to manage the pain, but this has not provided significant relief. He has not yet received a Toradol shot at the time of the consultation.  The patient has a history of avoiding narcotics but  is open to having them on hand for severe pain. He has previously used Tylenol PM for sleep. The patient is also considering a referral to a podiatrist for potential steroid injections directly into the foot if the pain persists.        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   Primary localized osteoarthritis of right hip 10/21/2019   Former smoker 12/10/2019   Chronic cough 02/12/2020   Dysphagia 02/17/2022   Hyperlipidemia 02/17/2022   Hypertriglyceridemia 03/14/2022   LBBB (left bundle branch block) 03/29/2022   Hypertensive heart disease with heart failure (HCC) 03/29/2022   Anticoagulant long-term use 03/29/2022   Achalasia of esophagus 03/30/2022   Chronic HFrEF (heart failure with reduced ejection fraction) (HCC) 04/20/2022   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   Laryngopharyngeal reflux (LPR) 10/11/2021   Insomnia with sleep apnea 05/26/2022   History of arthritis 05/26/2022   Foot pain, right 03/09/2018   Blood in urine 05/26/2022   Allergic rhinitis 05/26/2022   Morbid obesity (HCC) 05/26/2022   Hypertension 05/26/2022   At risk for falls 05/26/2022   Resolved Ambulatory Problems    Diagnosis Date Noted   Benign hypertensive heart disease without congestive heart failure    Obesity 10/03/2018   BMI 45.0-49.9, adult (HCC) 03/09/2018   Prostate cancer (HCC) 12/08/2015   Personal history of gout 05/23/2019   Bone bruise 03/09/2018   Past Medical History:  Diagnosis Date   Anxiety    Arthritis, degenerative    CHF (congestive heart failure) (HCC)    Colon polyps     Complication of anesthesia    Depression    Dysrhythmia 2017   GERD (gastroesophageal reflux disease)    Glaucoma    History of 2019 novel coronavirus disease (COVID-19) 06/2019   History of kidney stones    Pneumonia    PONV (postoperative nausea and vomiting)    Rhinitis, allergic     Outpatient Medications Prior to Visit  Medication Sig   acetaminophen (TYLENOL) 500 MG tablet Take 500-1,000 mg by mouth every 6 (six) hours as needed (for pain.).   allopurinol (ZYLOPRIM) 100 MG tablet Take 1 tablet (100 mg total) by mouth daily. Dont start until steroids haved calmed down the attack, but do not stop if an attack returns.   amoxicillin (AMOXIL) 250 MG capsule Take 1,000 mg by mouth See admin instructions. Take 1000 mg 1 hour prior to dental work   Azelastine HCl 137 MCG/SPRAY SOLN Place 2 sprays into both nostrils daily as needed (allergies).   Calcium Carb-Cholecalciferol (CALCIUM-VITAMIN D) 600-400 MG-UNIT TABS Take 1 tablet by mouth 3 (three) times daily.   diclofenac Sodium (VOLTAREN) 1 % GEL Apply 4 g topically 4 (four) times daily as  needed.   diphenhydramine-acetaminophen (TYLENOL PM) 25-500 MG TABS tablet Take 2 tablets by mouth at bedtime.    Evolocumab (REPATHA) 140 MG/ML SOSY Inject 140 mg into the skin every 14 (fourteen) days.   fexofenadine (ALLEGRA) 180 MG tablet Take 180 mg by mouth daily as needed for allergies or rhinitis.   Ginkgo 60 MG TABS Take 60 mg by mouth daily.   latanoprost (XALATAN) 0.005 % ophthalmic solution Place 1 drop into both eyes at bedtime.    methocarbamol (ROBAXIN) 750 MG tablet Take 1 tablet (750 mg total) by mouth every 6 (six) hours as needed for muscle spasms.   metoprolol succinate (TOPROL XL) 25 MG 24 hr tablet Take 1 tablet (25 mg total) by mouth daily.   mometasone (NASONEX) 50 MCG/ACT nasal spray Place 1 spray into the nose at bedtime.   Multiple Vitamin (MULTIVITAMIN WITH MINERALS) TABS tablet Take 1 tablet by mouth 2 (two) times daily.  Bariatric Multivitamin   Multiple Vitamins-Minerals (AIRBORNE PO) Take 1 tablet by mouth daily as needed (immune health).    predniSONE (DELTASONE) 10 MG tablet Take by mouth.   rivaroxaban (XARELTO) 20 MG TABS tablet Take 1 tablet (20 mg total) by mouth daily with supper.   rosuvastatin (CRESTOR) 20 MG tablet TAKE 1 TABLET DAILY   sacubitril-valsartan (ENTRESTO) 49-51 MG Take 1 tablet by mouth 2 (two) times daily.   Semaglutide-Weight Management (WEGOVY) 1 MG/0.5ML SOAJ Inject 1 mg into the skin once a week.   sertraline (ZOLOFT) 50 MG tablet TAKE 1 TABLET DAILY   spironolactone (ALDACTONE) 25 MG tablet Take 0.5 tablets (12.5 mg total) by mouth daily.   thiamine (VITAMIN B-1) 100 MG tablet Take 100 mg by mouth daily.   No facility-administered medications prior to visit.         Clinical Data Analysis:   Physical Exam  BP 124/82 (BP Location: Left Arm, Patient Position: Sitting)   Pulse 63   Temp 97.6 F (36.4 C) (Temporal)   Ht 6\' 3"  (1.905 m)   Wt 293 lb 3.2 oz (133 kg)   SpO2 98%   BMI 36.65 kg/m  Wt Readings from Last 10 Encounters:  10/02/22 293 lb 3.2 oz (133 kg)  10/02/22 289 lb (131.1 kg)  09/22/22 289 lb (131.1 kg)  09/18/22 294 lb (133.4 kg)  07/25/22 (!) 301 lb (136.5 kg)  06/16/22 (!) 312 lb 3.2 oz (141.6 kg)  06/13/22 (!) 313 lb (142 kg)  05/26/22 (!) 316 lb (143.3 kg)  05/19/22 (!) 315 lb 3.2 oz (143 kg)  05/19/22 (!) 318 lb 9.6 oz (144.5 kg)   Vital signs reviewed.  Nursing notes reviewed. Weight trend reviewed. Abnormalities and Problem-Specific  Physical Exam   EXTREMITIES: Left foot exhibits redness and tenderness, no open wounds or cuts observed.     General Appearance:  some pain but no acute distress appreciable.   Well-groomed, healthy-appearing male.  Well proportioned with no abnormal fat distribution.  Good muscle tone. Skin: Clear and well-hydrated. Pulmonary:  Normal work of breathing at rest, no respiratory distress apparent. SpO2: 98 %   Musculoskeletal: All extremities are intact.  Neurological:  Awake, alert, oriented, and engaged.  No obvious focal neurological deficits or cognitive impairments.  Sensorium seems unclouded.   Speech is clear and coherent with logical content. Psychiatric:  Appropriate mood, pleasant and cooperative demeanor, thoughtful and engaged during the exam  Results Reviewed:    No results found for any visits on 10/02/22.  Recent Results (from the past  2160 hour(s))  ECHOCARDIOGRAM COMPLETE     Status: None   Collection Time: 08/23/22  7:53 AM  Result Value Ref Range   Area-P 1/2 3.87 cm2   S' Lateral 4.90 cm   Est EF 25 - 30%   Basic Metabolic Panel (BMET)     Status: None   Collection Time: 09/18/22  9:53 AM  Result Value Ref Range   Glucose 80 70 - 99 mg/dL   BUN 13 8 - 27 mg/dL   Creatinine, Ser 1.61 0.76 - 1.27 mg/dL   eGFR 80 >09 UE/AVW/0.98   BUN/Creatinine Ratio 13 10 - 24   Sodium 141 134 - 144 mmol/L   Potassium 4.3 3.5 - 5.2 mmol/L   Chloride 103 96 - 106 mmol/L   CO2 23 20 - 29 mmol/L   Calcium 9.6 8.6 - 10.2 mg/dL  CBC     Status: Abnormal   Collection Time: 09/18/22  9:53 AM  Result Value Ref Range   WBC 6.0 3.4 - 10.8 x10E3/uL   RBC 4.93 4.14 - 5.80 x10E6/uL   Hemoglobin 16.2 13.0 - 17.7 g/dL   Hematocrit 11.9 14.7 - 51.0 %   MCV 99 (H) 79 - 97 fL   MCH 32.9 26.6 - 33.0 pg   MCHC 33.3 31.5 - 35.7 g/dL   RDW 82.9 56.2 - 13.0 %   Platelets 179 150 - 450 x10E3/uL  I-STAT 7, (LYTES, BLD GAS, ICA, H+H)     Status: Abnormal   Collection Time: 09/22/22  8:29 AM  Result Value Ref Range   pH, Arterial 7.370 7.35 - 7.45   pCO2 arterial 43.7 32 - 48 mmHg   pO2, Arterial 63 (L) 83 - 108 mmHg   Bicarbonate 25.3 20.0 - 28.0 mmol/L   TCO2 27 22 - 32 mmol/L   O2 Saturation 91 %   Acid-Base Excess 0.0 0.0 - 2.0 mmol/L   Sodium 140 135 - 145 mmol/L   Potassium 3.7 3.5 - 5.1 mmol/L   Calcium, Ion 1.31 1.15 - 1.40 mmol/L   HCT 46.0 39.0 - 52.0 %   Hemoglobin 15.6 13.0 -  17.0 g/dL   Sample type ARTERIAL   POCT I-Stat EG7     Status: None   Collection Time: 09/22/22  8:34 AM  Result Value Ref Range   pH, Ven 7.370 7.25 - 7.43   pCO2, Ven 45.8 44 - 60 mmHg   pO2, Ven 39 32 - 45 mmHg   Bicarbonate 26.4 20.0 - 28.0 mmol/L   TCO2 28 22 - 32 mmol/L   O2 Saturation 71 %   Acid-Base Excess 1.0 0.0 - 2.0 mmol/L   Sodium 140 135 - 145 mmol/L   Potassium 3.7 3.5 - 5.1 mmol/L   Calcium, Ion 1.34 1.15 - 1.40 mmol/L   HCT 45.0 39.0 - 52.0 %   Hemoglobin 15.3 13.0 - 17.0 g/dL   Sample type VENOUS    Comment NOTIFIED PHYSICIAN   POCT I-Stat EG7     Status: None   Collection Time: 09/22/22  8:35 AM  Result Value Ref Range   pH, Ven 7.369 7.25 - 7.43   pCO2, Ven 44.9 44 - 60 mmHg   pO2, Ven 40 32 - 45 mmHg   Bicarbonate 25.9 20.0 - 28.0 mmol/L   TCO2 27 22 - 32 mmol/L   O2 Saturation 72 %   Acid-Base Excess 0.0 0.0 - 2.0 mmol/L   Sodium 140 135 - 145 mmol/L   Potassium 3.7 3.5 -  5.1 mmol/L   Calcium, Ion 1.35 1.15 - 1.40 mmol/L   HCT 45.0 39.0 - 52.0 %   Hemoglobin 15.3 13.0 - 17.0 g/dL   Sample type VENOUS   POCT Activated clotting time     Status: None   Collection Time: 09/22/22  8:58 AM  Result Value Ref Range   Activated Clotting Time 239 seconds    Comment: Reference range 74-137 seconds for patients not on anticoagulant therapy.    No image results found.   CARDIAC CATHETERIZATION  Result Date: 09/22/2022   Prox RCA lesion is 50% stenosed.  RFR 0.99.  Not significant.   Ost Cx to Prox Cx lesion is 25% stenosed.   1st Diag lesion is 50% stenosed.   Mid LAD lesion is 25% stenosed.   There is moderate to severe left ventricular systolic dysfunction.   LV end diastolic pressure is normal.   The left ventricular ejection fraction is 30-35% by visual estimate.   There is no aortic valve stenosis.   Aortic saturation 91%, PA saturation 72%, mean RA pressure 4 mmHg, PA pressure 21/12, mean PA pressure 17 mmHg, mean pulmonary capillary wedge pressure 8 mmHg,  cardiac output 8.15 L/min, cardiac index 3.18. 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  Normal right heart pressures.  Continue aggressive medical therapy for LV dysfunction, atrial fibrillation and coronary artery disease.  If he does develop significant heart failure symptoms, would have to consider biventricular pacing given his left bundle branch block.   ECHOCARDIOGRAM COMPLETE  Result Date: 08/23/2022    ECHOCARDIOGRAM REPORT   Patient Name:   LOGUN CHINO Date of Exam: 08/23/2022 Medical Rec #:  [id number]       Height:       75.0 in Accession #:    [id number]      Weight:       301.0 lb Date of Birth:  09-26-1950        BSA:          2.611 m Patient Age:    71 years        BP:           134/68 mmHg Patient Gender: M               HR:           52 bpm. Exam Location:  [address] Procedure: 2D Echo, Cardiac Doppler and Color Doppler Indications:    I48.91 Atrial fibrillation  History:        Patient has prior history of Echocardiogram examinations, most                 recent 04/17/2022. Arrythmias:Atrial Fibrillation and LBBB; Risk                 Factors:Morbid obesity, Hypertension, Dyslipidemia and Former                 Smoker.  Sonographer:    Samule Ohm RDCS Referring Phys: [id number] JAYADEEP S VARANASI IMPRESSIONS  1. Left ventricular ejection fraction, by estimation, is 25 to 30%. The left ventricle has severely decreased function. The left ventricle demonstrates global hypokinesis. The left ventricular internal cavity size was mildly dilated. There is mild concentric left ventricular hypertrophy. Left ventricular diastolic function could not be evaluated.  2. Right ventricular systolic function is normal. The right ventricular size is mildly enlarged.  3. Left atrial size was severely dilated.  4. Right atrial size was severely dilated.  5. The  mitral valve is normal in structure. Mild mitral valve regurgitation. No evidence of mitral stenosis.  6. The aortic valve is  tricuspid. Aortic valve regurgitation is not visualized. No aortic stenosis is present.  7. Aortic dilatation noted. There is mild dilatation of the aortic root, measuring 41 mm.  8. The inferior vena cava is normal in size with greater than 50% respiratory variability, suggesting right atrial pressure of 3 mmHg. FINDINGS  Left Ventricle: Left ventricular ejection fraction, by estimation, is 25 to 30%. The left ventricle has severely decreased function. The left ventricle demonstrates global hypokinesis. The left ventricular internal cavity size was mildly dilated. There is mild concentric left ventricular hypertrophy. Abnormal (paradoxical) septal motion, consistent with left bundle branch block. Left ventricular diastolic function could not be evaluated due to atrial fibrillation. Left ventricular diastolic function could not be evaluated. Right Ventricle: The right ventricular size is mildly enlarged. Right ventricular systolic function is normal. Left Atrium: Left atrial size was severely dilated. Right Atrium: Right atrial size was severely dilated. Pericardium: There is no evidence of pericardial effusion. Mitral Valve: The mitral valve is normal in structure. Mild mitral valve regurgitation. No evidence of mitral valve stenosis. Tricuspid Valve: The tricuspid valve is normal in structure. Tricuspid valve regurgitation is trivial. No evidence of tricuspid stenosis. Aortic Valve: The aortic valve is tricuspid. Aortic valve regurgitation is not visualized. No aortic stenosis is present. Pulmonic Valve: The pulmonic valve was normal in structure. Pulmonic valve regurgitation is not visualized. No evidence of pulmonic stenosis. Aorta: Aortic dilatation noted. There is mild dilatation of the aortic root, measuring 41 mm. Venous: The inferior vena cava is normal in size with greater than 50% respiratory variability, suggesting right atrial pressure of 3 mmHg. IAS/Shunts: No atrial level shunt detected by color flow  Doppler.  LEFT VENTRICLE PLAX 2D LVIDd:         5.80 cm   Diastology LVIDs:         4.90 cm   LV e' medial:    8.40 cm/s LV PW:         1.40 cm   LV E/e' medial:  10.3 LV IVS:        1.30 cm   LV e' lateral:   11.00 cm/s LVOT diam:     2.10 cm   LV E/e' lateral: 7.9 LV SV:         48 LV SV Index:   18 LVOT Area:     3.46 cm  RIGHT VENTRICLE             IVC RV S prime:     11.83 cm/s  IVC diam: 1.10 cm TAPSE (M-mode): 1.8 cm LEFT ATRIUM              Index        RIGHT ATRIUM           Index LA diam:        6.00 cm  2.30 cm/m   RA Pressure: 3.00 mmHg LA Vol (A2C):   150.0 ml 57.45 ml/m  RA Area:     30.40 cm LA Vol (A4C):   139.0 ml 53.24 ml/m  RA Volume:   112.00 ml 42.90 ml/m LA Biplane Vol: 144.0 ml 55.16 ml/m  AORTIC VALVE LVOT Vmax:   76.30 cm/s LVOT Vmean:  51.060 cm/s LVOT VTI:    0.138 m  AORTA Ao Root diam: 4.10 cm Ao Asc diam:  3.80 cm MITRAL VALVE  TRICUSPID VALVE MV Area (PHT): 3.87 cm    Estimated RAP:  3.00 mmHg MV Decel Time: 196 msec MV E velocity: 86.88 cm/s  SHUNTS                            Systemic VTI:  0.14 m                            Systemic Diam: 2.10 cm Olga Millers MD Electronically signed by Olga Millers MD Signature Date/Time: 08/23/2022/8:53:15 AM    Final        Signed: Lula Olszewski, MD 10/02/2022 8:14 PM

## 2022-10-02 NOTE — Patient Instructions (Addendum)
It was a pleasure seeing you today! Your health and satisfaction are our top priorities.   Glenetta Hew, MD s observed.      During your visit, we discussed your severe gout flare in your left foot, which has not improved despite taking prednisone. We also talked about your pain management and general health maintenance.  YOUR PLAN:  -GOUT FLARE: Gout is a type of arthritis that causes painful inflammation in the joints, often in the big toe. We will continue your prednisone treatment and start you on colchicine, a medication that can reduce gout pain. We will also consider referring you to a podiatrist for a direct steroid injection if your pain persists. If your pain continues long term, we may refer you to a rheumatologist and consider using a new gout medication. We will provide you with a gout diet handout and advise you to follow a low purine diet and avoid alcohol. Please check your urine color to ensure you are staying hydrated. We will follow up in 1 week due to potential risks of treatment.  -PAIN MANAGEMENT: We discussed the use of narcotics for pain control if all else fails. We will provide Vicodin for use in desperate situations only and advise you on the risks of addiction.  INSTRUCTIONS:  Please continue taking your prednisone and start taking colchicine. Follow the gout diet provided and avoid alcohol. Check your urine color to ensure you are staying hydrated. Use Vicodin only in desperate situations. We will follow up in 1 week to check on your progress.    Next Steps:  [x]  Early Intervention: Schedule sooner appointment, call our on-call services, or go to emergency room if there is Increase in pain or discomfort New or worsening symptoms Sudden or severe changes in your health [x]  Flexible Follow-Up: We recommend a Return in about 1 week (around 10/09/2022) for close follow up acute illness. for optimal routine care. This allows for progress monitoring and treatment  adjustments. [x]  Preventive Care: Schedule your annual preventive care visit! It's typically covered by insurance and helps identify potential health issues early. [x]  Lab & X-ray Appointments: Incomplete tests scheduled today, or call to schedule. X-rays: [facility] (M-F, 8:30am-noon or 1pm-5pm). [x]  Medical Information Release: Sign a release form at front desk to obtain relevant medical information we don't have.  Making the Most of Our Focused (20 minute) Appointments:  [x]   Clearly state your top concerns at the beginning of the visit to focus our discussion [x]   If you anticipate you will need more time, please inform the front desk during scheduling - we can book multiple appointments in the same week. [x]   If you have transportation problems- use our convenient video appointments or ask about transportation support. [x]   We can get down to business faster if you use MyChart to update information before the visit and submit non-urgent questions before your visit. Thank you for taking the time to provide details through MyChart.  Let our nurse know and she can import this information into your encounter documents.  Arrival and Wait Times: [x]   Arriving on time ensures that everyone receives prompt attention. [x]   Early morning (8a) and afternoon (1p) appointments tend to have shortest wait times. [x]   Unfortunately, we cannot delay appointments for late arrivals or hold slots during phone calls.  Getting Answers and Following Up  [x]   Simple Questions & Concerns: For quick questions or basic follow-up after your visit, reach [place] at [phone] or MyChart  messaging. [x]   Complex Concerns: If your concern is more complex, scheduling an appointment might be best. Discuss this with the staff to find the most suitable option. [x]   Lab & Imaging Results: We'll contact you directly if results are abnormal or you don't use MyChart. Most normal results will be on MyChart within  2-3 business days, with a review message from Dr. Jon Billings. Haven't heard back in 2 weeks? Need results sooner? Contact us at [phone]. [x]   Referrals: Our referral coordinator will manage specialist referrals. The specialist's office should contact you within 2 weeks to schedule an appointment. Call us if you haven't heard from them after 2 weeks.  Staying Connected  [x]   MyChart: Activate your MyChart for the fastest way to access results and message [place]. See the last page of this paperwork for instructions on how to activate.  Bring to Your Next Appointment  [x]   Medications: Please bring all your medication bottles to your next appointment to ensure we have an accurate record of your prescriptions. [x]   Health Diaries: If you're monitoring any health conditions at home, keeping a diary of your readings can be very helpful for discussions at your next appointment.  Billing  [x]   X-ray & Lab Orders: These are billed by separate companies. Contact the invoicing company directly for questions or concerns. [x]   Visit Charges: Discuss any billing inquiries with our administrative services team.  Your Satisfaction Matters  [x]   Share Your Experience: We strive for your satisfaction! If you have any complaints, or preferably compliments, please let Dr. Jon Billings know directly or contact our Practice Administrators, Edwena Felty or [employer], by asking at the front desk.   Reviewing Your Records  [x]   Review this early draft of your clinical encounter notes below and the final encounter summary tomorrow on MyChart after its been completed.   Gout, unspecified cause, unspecified chronicity, unspecified site Assessment & Plan: Call back for rheumatology, podiatry, additional medications that require prior authorization, if this plan doesn't work.  Orders: -     Colchicine; Take 1 tablet (0.6 mg total) by mouth daily.  Dispense: 90 tablet; Refill: 3 -     predniSONE; Take 2 pills for  3 days, 1 pill for 4 days  Dispense: 10 tablet; Refill: 0 -     HYDROcodone-Acetaminophen; Take 1-2 tablets by mouth every 6 (six) hours as needed for moderate pain or severe pain.  Dispense: 20 tablet; Refill: 0

## 2022-10-02 NOTE — Progress Notes (Signed)
Cardiology Office Note   Date:  10/02/2022   ID:  Isaiah Jones, DOB 12-May-1950, MRN [id number]  PCP:  Lula Olszewski, MD    No chief complaint on file.  HFrEF  Wt Readings from Last 3 Encounters:  10/02/22 289 lb (131.1 kg)  09/22/22 289 lb (131.1 kg)  09/18/22 294 lb (133.4 kg)       History of Present Illness: Isaiah Jones is a 72 y.o. male    with history of hyperlipidemia, hypertension and obesity. He also is treated for sleep apnea with CPAP. He was seen in our office back in March 2018 due to new onset atrial fibrillation. His pulmonologist found the irregular heartbeat. Atrial fibrillation was diagnosed. He has had some fatigue and intermittent palpitations over the past few months prior to diagnosis.   At his initial visit in our office in March 2018, he was started on Toprol-XL 50 mg daily for rate control, Xarelto 20 mg daily for stroke prevention. Cardioversion was considered as well. He was advised to lose weight.   Echocardiogram from March 2018 revealed: - Left ventricle: The cavity size was normal. Wall thickness was   increased in a pattern of mild LVH. Systolic function was normal.   The estimated ejection fraction was in the range of 55% to 60%.   Wall motion was normal; there were no regional wall motion   abnormalities. Doppler parameters are consistent with abnormal   left ventricular relaxation (grade 1 diastolic dysfunction). - Left atrium: The atrium was mildly dilated. Volume/bsa, S: 37.4   ml/m^2.   He had his cardioversion but it was not successful.  He has been maintained on rate control meds. He feels colder than he did before after starting anticoagulation.   He has had weight fluctuations.  He has some hip problems but was told he is too big for ortho surgery.  He was 375 lbs at that time.    At the last visit, he was considering weight loss surgery.  It occurred in 3/21.  Then he had COVID.  Lost 70 lbs so far after surgery.   Hip  replacement in 6/21.  Dr. Jerl Santos.   Monitor showed in 12/2020: "Atrial fibrillation with average HR in the high 50s to low 70s range.  Pauses noted during sleeping hours. Frequent PVCs. "    In 3/23: "Rare, palpitations lasting just a few minutes.   Occasional leg edema which resolves by elevating legs."     EF in December 2023 showed decreased LVEF.  No angina at that time.  He was started on heart failure medication. Had admission to [facility]/2022.  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  It was noted that A-fib persisted on the monitor.   Metoprolol has been stopped due to bradycardia but later restarted.   LVEF remained low Despite medical therapy.  Cath in 5/24 showed: "Prox RCA lesion is 50% stenosed.  RFR 0.99.  Not significant.   Ost Cx to Prox Cx lesion is 25% stenosed.   1st Diag lesion is 50% stenosed.   Mid LAD lesion is 25% stenosed.   There is moderate to severe left ventricular systolic dysfunction.   LV end diastolic pressure is normal.   The left ventricular ejection fraction is 30-35% by visual estimate.   There is no aortic valve stenosis.   Aortic saturation 91%, PA saturation 72%, mean RA pressure 4 mmHg, PA pressure 21/12, mean PA pressure 17 mmHg, mean  pulmonary capillary wedge pressure 8 mmHg, cardiac output 8.15 L/min, cardiac index 3.18.   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  Normal right heart pressures.  Continue aggressive medical therapy for LV dysfunction, atrial fibrillation and coronary artery disease.  If he does develop significant heart failure symptoms, would have to consider biventricular pacing given his left bundle branch block."  His father had what sounds like a nonischemic cardiomyopathy.   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    BMI 45.0-49.9, adult  (HCC) 03/09/2018   CHF (congestive heart failure) (HCC)    Colon polyps    Complication of anesthesia    Depression    Dyslipidemia    Dysrhythmia 2017   A fib   GERD (gastroesophageal reflux disease)    Glaucoma    early stage   History of 2019 novel coronavirus disease (COVID-19) 06/2019   History of kidney stones    Hypertension    Hypertriglyceridemia 03/14/2022   Lab Results Component Value Date/Time  TRIG 154.0 (H) 03/13/2022 08:53 AM  TRIG 188.0 (H) 06/14/2021 09:38 AM  TRIG 203.0 (H) 06/11/2020 09:38 AM  TRIG 166.0 (H) 06/11/2019 09:44 AM  TRIG 177 (H) 02/18/2019 08:36 AM     OSA on CPAP    C-Pap   Persistent atrial fibrillation (HCC)    Echo 3/18: Mild LVH, EF 55-60, normal wall motion, grade 1 diastolic dysfunction, mild LAE   Personal history of gout 05/23/2019   Pneumonia    childhood   PONV (postoperative nausea and vomiting)    after 1st knee surgery only   Rhinitis, allergic     Past Surgical History:  Procedure Laterality Date   arthroscopic knee surgery Bilateral 2003   CARDIOVERSION N/A 08/10/2016   Procedure: CARDIOVERSION;  Surgeon: Chrystie Nose, MD;  Location: [facility] ENDOSCOPY;  Service: Cardiovascular;  Laterality: N/A;   COLONOSCOPY     CORONARY PRESSURE/FFR STUDY N/A 09/22/2022   Procedure: CORONARY PRESSURE/FFR STUDY;  Surgeon: Corky Crafts, MD;  Location: [facility] INVASIVE CV LAB;  Service: Cardiovascular;  Laterality: N/A;   CYSTOSCOPY WITH RETROGRADE PYELOGRAM, URETEROSCOPY AND STENT PLACEMENT Left 03/04/2021   Procedure: CYSTOSCOPY WITH BILATERAL RETROGRADES,PYELOGRAM, URETEROSCOPY AND STENT PLACEMENT;  Surgeon: Sebastian Ache, MD;  Location: [facility] ORS;  Service: Urology;  Laterality: Left;  1 HR   ESOPHAGEAL MANOMETRY N/A 03/22/2022   Procedure: ESOPHAGEAL MANOMETRY (EM);  Surgeon: Imogene Burn, MD;  Location: [facility] ENDOSCOPY;  Service: Gastroenterology;  Laterality: N/A;   HOLMIUM LASER APPLICATION Left 03/04/2021   Procedure: HOLMIUM LASER  APPLICATION;  Surgeon: Sebastian Ache, MD;  Location: [facility] ORS;  Service: Urology;  Laterality: Left;   LAPAROSCOPIC GASTRIC SLEEVE RESECTION N/A 07/15/2019   Procedure: LAPAROSCOPIC GASTRIC SLEEVE RESECTION, Upper Endo, ERAS Pathway;  Surgeon: Luretha Murphy, MD;  Location: [facility] ORS;  Service: General;  Laterality: N/A;   REPLACEMENT TOTAL KNEE Bilateral 2008   RIGHT/LEFT HEART CATH AND CORONARY ANGIOGRAPHY N/A 09/22/2022   Procedure: RIGHT/LEFT HEART CATH AND CORONARY ANGIOGRAPHY;  Surgeon: Corky Crafts, MD;  Location: [facility] INVASIVE CV LAB;  Service: Cardiovascular;  Laterality: N/A;   TONSILLECTOMY     TOTAL HIP ARTHROPLASTY Right 10/21/2019   Procedure: RIGHT TOTAL HIP ARTHROPLASTY ANTERIOR APPROACH;  Surgeon: Marcene Corning, MD;  Location: [facility] ORS;  Service: Orthopedics;  Laterality: Right;   VASECTOMY       Current Outpatient  Medications  Medication Sig Dispense Refill   acetaminophen (TYLENOL) 500 MG tablet Take 500-1,000 mg by mouth every 6 (six) hours as needed (for pain.).     allopurinol (ZYLOPRIM) 100 MG tablet Take 1 tablet (100 mg total) by mouth daily. Dont start until steroids haved calmed down the attack, but do not stop if an attack returns. 90 tablet 3   amoxicillin (AMOXIL) 250 MG capsule Take 1,000 mg by mouth See admin instructions. Take 1000 mg 1 hour prior to dental work     Azelastine HCl 137 MCG/SPRAY SOLN Place 2 sprays into both nostrils daily as needed (allergies).     Calcium Carb-Cholecalciferol (CALCIUM-VITAMIN D) 600-400 MG-UNIT TABS Take 1 tablet by mouth 3 (three) times daily.     diclofenac Sodium (VOLTAREN) 1 % GEL Apply 4 g topically 4 (four) times daily as needed. 100 g 3   diphenhydramine-acetaminophen (TYLENOL PM) 25-500 MG TABS tablet Take 2 tablets by mouth at bedtime.      Evolocumab (REPATHA) 140 MG/ML SOSY Inject 140 mg into the skin every 14 (fourteen) days. 2.1 mL 11   fexofenadine (ALLEGRA) 180 MG tablet Take 180 mg by mouth daily as needed for  allergies or rhinitis.     Ginkgo 60 MG TABS Take 60 mg by mouth daily.     latanoprost (XALATAN) 0.005 % ophthalmic solution Place 1 drop into both eyes at bedtime.      methocarbamol (ROBAXIN) 750 MG tablet Take 1 tablet (750 mg total) by mouth every 6 (six) hours as needed for muscle spasms. 40 tablet 1   metoprolol succinate (TOPROL XL) 25 MG 24 hr tablet Take 1.5 tablets (37.5 mg total) by mouth daily. (Patient taking differently: Take 12.5 mg by mouth daily.) 45 tablet 6   mometasone (NASONEX) 50 MCG/ACT nasal spray Place 1 spray into the nose at bedtime.     Multiple Vitamin (MULTIVITAMIN WITH MINERALS) TABS tablet Take 1 tablet by mouth 2 (two) times daily. Bariatric Multivitamin     Multiple Vitamins-Minerals (AIRBORNE PO) Take 1 tablet by mouth daily as needed (immune health).      predniSONE (DELTASONE) 10 MG tablet Take by mouth.     rivaroxaban (XARELTO) 20 MG TABS tablet Take 1 tablet (20 mg total) by mouth daily with supper. 90 tablet 1   rosuvastatin (CRESTOR) 20 MG tablet TAKE 1 TABLET DAILY 90 tablet 3   sacubitril-valsartan (ENTRESTO) 49-51 MG Take 1 tablet by mouth 2 (two) times daily. 180 tablet 3   Semaglutide-Weight Management (WEGOVY) 1 MG/0.5ML SOAJ Inject 1 mg into the skin once a week. 2 mL 1   sertraline (ZOLOFT) 50 MG tablet TAKE 1 TABLET DAILY 90 tablet 1   spironolactone (ALDACTONE) 25 MG tablet Take 0.5 tablets (12.5 mg total) by mouth daily. 45 tablet 3   thiamine (VITAMIN B-1) 100 MG tablet Take 100 mg by mouth daily.     No current facility-administered medications for this visit.    Allergies:   Erythromycin    Social History:  The patient  reports that he quit smoking about 21 years ago. His smoking use included cigarettes. He has never used smokeless tobacco. He reports current alcohol use. He reports that he does not use drugs.   Family History:  The patient's family history includes Alzheimer's disease in his mother; Arthritis in his father and mother;  Esophageal cancer in his father; Heart attack in his mother; Heart attack (age of onset: 35) in his father; Hyperlipidemia in his brother,  father, and mother; Hypertension in his brother, father, and mother; Other in his brother. Father with nonischemic cardiomyopathy   ROS:  Please see the history of present illness.   Otherwise, review of systems are positive for right wrist is bruised.   All other systems are reviewed and negative.    PHYSICAL EXAM: VS:  BP 120/72   Pulse (!) 54   Ht 6\' 3"  (1.905 m)   Wt 289 lb (131.1 kg)   SpO2 96%   BMI 36.12 kg/m  , BMI Body mass index is 36.12 kg/m. GEN: Well nourished, well developed, in no acute distress HEENT: normal Neck: no JVD, carotid bruits, or masses Cardiac: irregularly irregular; no murmurs, rubs, or gallops,no edema  Respiratory:  clear to auscultation bilaterally, normal work of breathing GI: soft, nontender, nondistended, + BS MS: no deformity or atrophy; varicose veins;  small  Skin: warm and dry, no rash Neuro:  Strength and sensation are intact Psych: euthymic mood, full affect   Recent Labs: 03/13/2022: ALT 21; TSH 1.36 09/18/2022: BUN 13; Creatinine, Ser 0.99; Platelets 179 09/22/2022: Hemoglobin 15.3; Potassium 3.7; Sodium 140   Lipid Panel    Component Value Date/Time   CHOL 148 03/13/2022 0853   CHOL 133 02/18/2019 0836   TRIG 154.0 (H) 03/13/2022 0853   HDL 48.70 03/13/2022 0853   HDL 40 02/18/2019 0836   CHOLHDL 3 03/13/2022 0853   VLDL 30.8 03/13/2022 0853   LDLCALC 68 03/13/2022 0853   LDLCALC 63 02/18/2019 0836   LDLDIRECT 76.0 06/11/2020 0938     Other studies Reviewed: Additional studies/ records that were reviewed today with results demonstrating: Catheter results reviewed, RFR testing discussed with the patient.   ASSESSMENT AND PLAN:  HFrEF: Increase metoprolol from 12.5 mg to 25 mg daily.  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  His father also had what sounds to be like a  nonischemic cardiomyopathy.  He has a nephew whose grandfather also had a nonischemic cardiomyopathy.  He is on beta-blocker, spironolactone and Entresto.  We discussed Marcelline Deist but given his essentially normal blood sugar, will hold off.  He will continue semaglutide. AFib: Rate controlled.  HR at the apex is 80bpm.  Pulse rate when checked at the wrist is likely an underestimate.  I think he will tolerate the increase metoprolol and hopefully this will help his LVEF improved. Hypertensive heart disease: The current medical regimen is effective;  continue present plan and medications. Anticoagulated: No bleeding.   Morbid obesity: Has been on Wegovy. Hyperlipidemia: The current medical regimen is effective;  continue present plan and medications. LDL 68 OSA: Still working on getting treatment for OSA.  Looking to get an "APAC."  I am unfamiliar with this but he describes some type of auto titration of the oxygen that is bled in to the system.  He had night time hypoxemia on a sleep study several years ago.  I offered an appointment with Dr. Mayford Knife.  He will think about it and if he does want to have different sleep apnea management, he will let us know.   Current medicines are reviewed at length with the patient today.  The patient concerns regarding his medicines were addressed.  The following changes have been made:  No change  Labs/ tests ordered today include:  No orders of the defined types were placed in this encounter.   Recommend 150 minutes/week of aerobic exercise Low fat, low carb, high fiber diet recommended  Disposition:   FU  in 6 months   Signed, Lance Muss, MD  10/02/2022 8:31 AM    [facility] [address] [place], [place], [place]  [address] Phone: [phone]; Fax: [phone]

## 2022-10-11 ENCOUNTER — Encounter: Payer: Self-pay | Admitting: Internal Medicine

## 2022-10-11 ENCOUNTER — Ambulatory Visit: Payer: Medicare Other | Admitting: Internal Medicine

## 2022-10-11 VITALS — BP 144/90 | HR 73 | Temp 97.2°F | Ht 75.0 in | Wt 286.8 lb

## 2022-10-11 DIAGNOSIS — M1000 Idiopathic gout, unspecified site: Secondary | ICD-10-CM

## 2022-10-11 DIAGNOSIS — I10 Essential (primary) hypertension: Secondary | ICD-10-CM | POA: Diagnosis not present

## 2022-10-11 DIAGNOSIS — I2510 Atherosclerotic heart disease of native coronary artery without angina pectoris: Secondary | ICD-10-CM | POA: Insufficient documentation

## 2022-10-11 MED ORDER — ALLOPURINOL 300 MG PO TABS
300.0000 mg | ORAL_TABLET | Freq: Every day | ORAL | 3 refills | Status: DC
Start: 2022-10-11 — End: 2023-09-24

## 2022-10-11 MED ORDER — ROSUVASTATIN CALCIUM 40 MG PO TABS
40.0000 mg | ORAL_TABLET | Freq: Every day | ORAL | 3 refills | Status: DC
Start: 2022-10-11 — End: 2023-09-24

## 2022-10-11 NOTE — Assessment & Plan Note (Signed)
Slightly uncontrolled in clinic, but usually good at home. Discussed modifying and we did shared decision making to agree continue(s) with:   Current hypertension medications:       Sig   metoprolol succinate (TOPROL XL) 25 MG 24 hr tablet (Taking) Take 1 tablet (25 mg total) by mouth daily.   sacubitril-valsartan (ENTRESTO) 49-51 MG (Taking) Take 1 tablet by mouth 2 (two) times daily.   spironolactone (ALDACTONE) 25 MG tablet (Taking) Take 0.5 tablets (12.5 mg total) by mouth daily.

## 2022-10-11 NOTE — Progress Notes (Addendum)
[facility]: [phone]   Routine Medical Office Visit  Patient:  Isaiah Jones      Age: 72 y.o.       Sex:  male  Date:   10/11/2022 PCP:    Lula Olszewski, MD   Today's Healthcare Provider: Lula Olszewski, MD   Assessment and Plan:   AI-Extracted Assessment and Plan    Gout: Their acute flare is resolving with improved pain and swelling while on colchicine and prednisone. They have received advice on dietary modifications to prevent future flares. We will continue allopurinol 100 mg daily, increasing to 300 mg daily for gout prevention, and maintain colchicine as needed for acute flares. Dietary modifications, including limiting purine-rich foods, are considered.  Hypertension: Their blood pressure readings are variable, possibly elevated due to recent prednisone use. We will continue the current antihypertensive regimen, including metoprolol, Entresto, and spironolactone, and monitor blood pressure regularly at home.  Hyperlipidemia: They are on rosuvastatin 20 mg and Repatha, with discussions on the benefits of omega-3 fatty acids and avocados for cholesterol management. We will increase fluvastatin to 40 mg daily, continue Repatha as prescribed, and encourage dietary modifications including increased intake of omega-3 fatty acids and avocados.  Coronary Artery Disease: They have a history of low ejection fraction and multiple coronary blockages, with recent heart catheterization showing no significant worsening. We will continue current cardiac medications and blood thinners, consider future placement of a defibrillator if ejection fraction remains low, and encourage regular exercise as tolerated.  Weight Management: They have lost significant weight with the use of Wegovy. We encourage the continuation of Wegovy and regular exercise for weight management.  General Health Maintenance: We will order labs for uric acid and cholesterol levels in August  and schedule a follow-up appointment after lab results in August or October.        Charting-Extracted Assessment and Plan Thielen was seen today for one week follow-up.  Acute idiopathic gout, unspecified site Overview: Frequent attacks of gout for many years  Trying to avoid pills, so not taking prescribed allopurinol Tries to follow diet Avoiding beer now.  Assessment & Plan: Acute gout podagra flare resolving on today's exam- 97.2% of pain is gone now per patient report  Advised patient to increase allopurinol 100-300 to hopefully prevent further recurrences (failure of diet control) Ok to stop colchicine, prednisone finished Discussed diet recommendations for this in context of coronary artery disease dietary recommendations.   Orders: -     Allopurinol; Take 1 tablet (300 mg total) by mouth daily.  Dispense: 90 tablet; Refill: 3  Essential hypertension Overview: Interim history from October 11, 2022:    Home readings: high at home this am but has been good at home mostly Patient reports taking current medications consistently and not experiencing any significant associated side effects or symptoms. Current hypertension medications:       Sig   metoprolol succinate (TOPROL XL) 25 MG 24 hr tablet (Taking) Take 1 tablet (25 mg total) by mouth daily.   sacubitril-valsartan (ENTRESTO) 49-51 MG (Taking) Take 1 tablet by mouth 2 (two) times daily.   spironolactone (ALDACTONE) 25 MG tablet (Taking) Take 0.5 tablets (12.5 mg total) by mouth daily.      Lab Results  Component Value Date   NA 140 09/22/2022   K 3.7 09/22/2022   CREATININE 0.99 09/18/2022   GFR 83.10 03/13/2022     Assessment & Plan: Slightly uncontrolled in clinic, but usually good at home. Discussed  modifying and we did shared decision making to agree continue(s) with:   Current hypertension medications:       Sig   metoprolol succinate (TOPROL XL) 25 MG 24 hr tablet (Taking) Take 1 tablet (25 mg total)  by mouth daily.   sacubitril-valsartan (ENTRESTO) 49-51 MG (Taking) Take 1 tablet by mouth 2 (two) times daily.   spironolactone (ALDACTONE) 25 MG tablet (Taking) Take 0.5 tablets (12.5 mg total) by mouth daily.         Coronary artery disease involving native coronary artery of native heart without angina pectoris Overview: Following with Cardiologist Lance Muss, MD 08/2022 left heart catheterization:   Prox RCA lesion is 50% stenosed.  RFR 0.99.  Not significant.   Ost Cx to Prox Cx lesion is 25% stenosed.   1st Diag lesion is 50% stenosed.   Mid LAD lesion is 25% stenosed.   There is moderate to severe left ventricular systolic dysfunction.   LV end diastolic pressure is normal.   The left ventricular ejection fraction is 30-35% by visual estimate.   There is no aortic valve stenosis.   Aortic saturation 91%, PA saturation 72%, mean RA pressure 4 mmHg, PA pressure 21/12, mean PA pressure 17 mmHg, mean pulmonary capillary wedge pressure 8 mmHg, cardiac output 8.15 L/min, cardiac index 3.18.   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  Normal right heart pressures.  Continue aggressive medical therapy for LV dysfunction, atrial fibrillation and coronary artery disease.  If he does develop significant heart failure symptoms, would have to consider biventricular pacing given his left bundle branch block.  Assessment & Plan: Independently reviewed left heart catheterization findings with patient and reinforced importance of aggressive coronary artery disease medication(s) management. Encouraged patient to follow up closely with Cardiologist Lance Muss, MD Encouraged patient to to try to increase rosuvastatin if tolerated Encouraged patient to continue(s) with Repatha Gave counseling for daily avocado. No signs of CHF flare for now so plan is to not go forward with biventricular pacing per Dr. Eldridge Dace, explained to patient.  Orders: -     Rosuvastatin  Calcium; Take 1 tablet (40 mg total) by mouth daily.  Dispense: 90 tablet; Refill: 3           Clinical Presentation:    72 y.o. male here today for One week follow-up  AI-Extracted: Discussed the use of AI scribe software for clinical note transcription with the patient, who gave verbal consent to proceed.  History of Present Illness   The patient, previously diagnosed with gout, presented for a follow-up visit. They reported a significant improvement in their toe pain, estimating a 97.2% reduction in discomfort. Despite the persistent redness and swelling, the patient was able to resume cycling activities. They were adhering to the prescribed medication regimen but sought clarification on the optimal timing for medication intake.  The patient expressed a desire to reintroduce omega-3 supplements into their regimen, which was encouraged by the physician during the previous visit. They also discussed dietary changes, including the potential benefits and drawbacks of consuming certain types of fish due to their gout condition.  The patient reported a history of kidney stones, which led to a discussion about the potential benefits of allopurinol in preventing uric acid stones. They were already on allopurinol, and the decision was made to increase the dosage.  The patient also reported a significant weight loss of 31-32 pounds since starting a new medication, Wegovy. They expressed concerns about their blood pressure, which was slightly elevated  at the time of the visit. However, they regularly monitored their blood pressure at home and reported that it had been generally well-controlled.  The patient also discussed their recent heart catheterization, revealing multiple blockages of 25-50%.  In summary, the patient presented with improved gout symptoms, ongoing management of cardiovascular risk factors, and a significant weight loss. They were advised on medication adjustments, dietary changes,  and the importance of continued monitoring of their blood pressure and cholesterol levels.        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   Primary localized osteoarthritis of right hip 10/21/2019   Former smoker 12/10/2019   Chronic cough 02/12/2020   Dysphagia 02/17/2022   Hyperlipidemia 02/17/2022   Hypertriglyceridemia 03/14/2022   LBBB (left bundle branch block) 03/29/2022   Hypertensive heart disease with heart failure (HCC) 03/29/2022   Anticoagulant long-term use 03/29/2022   Achalasia of esophagus 03/30/2022   Chronic HFrEF (heart failure with reduced ejection fraction) (HCC) 04/20/2022   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   Laryngopharyngeal reflux (LPR) 10/11/2021   Insomnia with sleep apnea 05/26/2022   History of arthritis 05/26/2022   Foot pain, right 03/09/2018   Blood in urine 05/26/2022   Allergic rhinitis 05/26/2022   Morbid obesity (HCC) 05/26/2022   Hypertension 05/26/2022   At risk for falls 05/26/2022   CAD (coronary artery disease) 10/11/2022   Resolved Ambulatory Problems    Diagnosis Date Noted   Benign hypertensive heart disease without congestive heart failure    Obesity 10/03/2018   BMI 45.0-49.9, adult (HCC) 03/09/2018   Prostate cancer (HCC) 12/08/2015   Personal history of gout 05/23/2019   Bone bruise 03/09/2018   Past Medical History:  Diagnosis Date   Anxiety    Arthritis, degenerative    CHF (congestive heart failure) (HCC)    Colon polyps    Complication of anesthesia    Depression    Dysrhythmia 2017   GERD (gastroesophageal reflux disease)     Glaucoma    History of 2019 novel coronavirus disease (COVID-19) 06/2019   History of kidney stones    Pneumonia    PONV (postoperative nausea and vomiting)    Rhinitis, allergic     Outpatient Medications Prior to Visit  Medication Sig   acetaminophen (TYLENOL) 500 MG tablet Take 500-1,000 mg by mouth every 6 (six) hours as needed (for pain.).   amoxicillin (AMOXIL) 250 MG capsule Take 1,000 mg by mouth See admin instructions. Take 1000 mg 1 hour prior to dental work   Azelastine HCl 137 MCG/SPRAY SOLN Place 2 sprays into both nostrils daily as needed (allergies).   Calcium Carb-Cholecalciferol (CALCIUM-VITAMIN D) 600-400 MG-UNIT TABS Take 1 tablet by mouth 3 (three) times daily.   colchicine 0.6 MG tablet Take 1 tablet (0.6 mg total) by mouth daily.   diclofenac Sodium (VOLTAREN) 1 % GEL Apply 4 g topically 4 (four) times daily as needed.   diphenhydramine-acetaminophen (TYLENOL PM) 25-500 MG TABS tablet Take 2 tablets by mouth at bedtime.    Evolocumab (REPATHA) 140 MG/ML SOSY Inject 140 mg into the skin every 14 (fourteen) days.   fexofenadine (  ALLEGRA) 180 MG tablet Take 180 mg by mouth daily as needed for allergies or rhinitis.   Ginkgo 60 MG TABS Take 60 mg by mouth daily.   latanoprost (XALATAN) 0.005 % ophthalmic solution Place 1 drop into both eyes at bedtime.    methocarbamol (ROBAXIN) 750 MG tablet Take 1 tablet (750 mg total) by mouth every 6 (six) hours as needed for muscle spasms.   metoprolol succinate (TOPROL XL) 25 MG 24 hr tablet Take 1 tablet (25 mg total) by mouth daily.   mometasone (NASONEX) 50 MCG/ACT nasal spray Place 1 spray into the nose at bedtime.   Multiple Vitamin (MULTIVITAMIN WITH MINERALS) TABS tablet Take 1 tablet by mouth 2 (two) times daily. Bariatric Multivitamin   Multiple Vitamins-Minerals (AIRBORNE PO) Take 1 tablet by mouth daily as needed (immune health).    Omega-3 Fatty Acids (FISH OIL PO) Take by mouth.   rivaroxaban (XARELTO) 20 MG TABS  tablet Take 1 tablet (20 mg total) by mouth daily with supper.   rosuvastatin (CRESTOR) 20 MG tablet TAKE 1 TABLET DAILY   sacubitril-valsartan (ENTRESTO) 49-51 MG Take 1 tablet by mouth 2 (two) times daily.   Semaglutide-Weight Management (WEGOVY) 1 MG/0.5ML SOAJ Inject 1 mg into the skin once a week.   sertraline (ZOLOFT) 50 MG tablet TAKE 1 TABLET DAILY   spironolactone (ALDACTONE) 25 MG tablet Take 0.5 tablets (12.5 mg total) by mouth daily.   thiamine (VITAMIN B-1) 100 MG tablet Take 100 mg by mouth daily.   [DISCONTINUED] allopurinol (ZYLOPRIM) 100 MG tablet Take 1 tablet (100 mg total) by mouth daily. Dont start until steroids haved calmed down the attack, but do not stop if an attack returns.   [DISCONTINUED] HYDROcodone-acetaminophen (NORCO/VICODIN) 5-325 MG tablet Take 1-2 tablets by mouth every 6 (six) hours as needed for moderate pain or severe pain.   [DISCONTINUED] predniSONE (DELTASONE) 20 MG tablet Take 2 pills for 3 days, 1 pill for 4 days   [DISCONTINUED] predniSONE (DELTASONE) 10 MG tablet Take by mouth.   No facility-administered medications prior to visit.         Clinical Data Analysis:   Physical Exam  BP (!) 144/90 (BP Location: Left Arm, Patient Position: Sitting)   Pulse 73   Temp (!) 97.2 F (36.2 C) (Temporal)   Ht 6\' 3"  (1.905 m)   Wt 286 lb 12.8 oz (130.1 kg)   SpO2 96%   BMI 35.85 kg/m  Wt Readings from Last 10 Encounters:  10/11/22 286 lb 12.8 oz (130.1 kg)  10/02/22 293 lb 3.2 oz (133 kg)  10/02/22 289 lb (131.1 kg)  09/22/22 289 lb (131.1 kg)  09/18/22 294 lb (133.4 kg)  07/25/22 (!) 301 lb (136.5 kg)  06/16/22 (!) 312 lb 3.2 oz (141.6 kg)  06/13/22 (!) 313 lb (142 kg)  05/26/22 (!) 316 lb (143.3 kg)  05/19/22 (!) 315 lb 3.2 oz (143 kg)   Vital signs reviewed.  Nursing notes reviewed. Weight trend reviewed. Abnormalities and Problem-Specific physical exam findings:  fading redness around the previously bright red area of podagra. Weight loss  impressive.  General Appearance:  No acute distress appreciable.   Well-groomed, healthy-appearing male.  Well proportioned with no abnormal fat distribution.  Good muscle tone. Skin: Clear and well-hydrated. Pulmonary:  Normal work of breathing at rest, no respiratory distress apparent. SpO2: 96 %  Musculoskeletal: All extremities are intact.  Neurological:  Awake, alert, oriented, and engaged.  No obvious focal neurological deficits or cognitive impairments.  Sensorium  seems unclouded.   Speech is clear and coherent with logical content. Psychiatric:  Appropriate mood, pleasant and cooperative demeanor, thoughtful and engaged during the exam  Results Reviewed:    No results found for any visits on 10/11/22.  Recent Results (from the past 2160 hour(s))  ECHOCARDIOGRAM COMPLETE     Status: None   Collection Time: 08/23/22  7:53 AM  Result Value Ref Range   Area-P 1/2 3.87 cm2   S' Lateral 4.90 cm   Est EF 25 - 30%   Basic Metabolic Panel (BMET)     Status: None   Collection Time: 09/18/22  9:53 AM  Result Value Ref Range   Glucose 80 70 - 99 mg/dL   BUN 13 8 - 27 mg/dL   Creatinine, Ser 1.61 0.76 - 1.27 mg/dL   eGFR 80 >09 UE/AVW/0.98   BUN/Creatinine Ratio 13 10 - 24   Sodium 141 134 - 144 mmol/L   Potassium 4.3 3.5 - 5.2 mmol/L   Chloride 103 96 - 106 mmol/L   CO2 23 20 - 29 mmol/L   Calcium 9.6 8.6 - 10.2 mg/dL  CBC     Status: Abnormal   Collection Time: 09/18/22  9:53 AM  Result Value Ref Range   WBC 6.0 3.4 - 10.8 x10E3/uL   RBC 4.93 4.14 - 5.80 x10E6/uL   Hemoglobin 16.2 13.0 - 17.7 g/dL   Hematocrit 11.9 14.7 - 51.0 %   MCV 99 (H) 79 - 97 fL   MCH 32.9 26.6 - 33.0 pg   MCHC 33.3 31.5 - 35.7 g/dL   RDW 82.9 56.2 - 13.0 %   Platelets 179 150 - 450 x10E3/uL  I-STAT 7, (LYTES, BLD GAS, ICA, H+H)     Status: Abnormal   Collection Time: 09/22/22  8:29 AM  Result Value Ref Range   pH, Arterial 7.370 7.35 - 7.45   pCO2 arterial 43.7 32 - 48 mmHg   pO2, Arterial 63 (L)  83 - 108 mmHg   Bicarbonate 25.3 20.0 - 28.0 mmol/L   TCO2 27 22 - 32 mmol/L   O2 Saturation 91 %   Acid-Base Excess 0.0 0.0 - 2.0 mmol/L   Sodium 140 135 - 145 mmol/L   Potassium 3.7 3.5 - 5.1 mmol/L   Calcium, Ion 1.31 1.15 - 1.40 mmol/L   HCT 46.0 39.0 - 52.0 %   Hemoglobin 15.6 13.0 - 17.0 g/dL   Sample type ARTERIAL   POCT I-Stat EG7     Status: None   Collection Time: 09/22/22  8:34 AM  Result Value Ref Range   pH, Ven 7.370 7.25 - 7.43   pCO2, Ven 45.8 44 - 60 mmHg   pO2, Ven 39 32 - 45 mmHg   Bicarbonate 26.4 20.0 - 28.0 mmol/L   TCO2 28 22 - 32 mmol/L   O2 Saturation 71 %   Acid-Base Excess 1.0 0.0 - 2.0 mmol/L   Sodium 140 135 - 145 mmol/L   Potassium 3.7 3.5 - 5.1 mmol/L   Calcium, Ion 1.34 1.15 - 1.40 mmol/L   HCT 45.0 39.0 - 52.0 %   Hemoglobin 15.3 13.0 - 17.0 g/dL   Sample type VENOUS    Comment NOTIFIED PHYSICIAN   POCT I-Stat EG7     Status: None   Collection Time: 09/22/22  8:35 AM  Result Value Ref Range   pH, Ven 7.369 7.25 - 7.43   pCO2, Ven 44.9 44 - 60 mmHg   pO2, Ven 40 32 -  45 mmHg   Bicarbonate 25.9 20.0 - 28.0 mmol/L   TCO2 27 22 - 32 mmol/L   O2 Saturation 72 %   Acid-Base Excess 0.0 0.0 - 2.0 mmol/L   Sodium 140 135 - 145 mmol/L   Potassium 3.7 3.5 - 5.1 mmol/L   Calcium, Ion 1.35 1.15 - 1.40 mmol/L   HCT 45.0 39.0 - 52.0 %   Hemoglobin 15.3 13.0 - 17.0 g/dL   Sample type VENOUS   POCT Activated clotting time     Status: None   Collection Time: 09/22/22  8:58 AM  Result Value Ref Range   Activated Clotting Time 239 seconds    Comment: Reference range 74-137 seconds for patients not on anticoagulant therapy.    No image results found.   CARDIAC CATHETERIZATION  Result Date: 09/22/2022   Prox RCA lesion is 50% stenosed.  RFR 0.99.  Not significant.   Ost Cx to Prox Cx lesion is 25% stenosed.   1st Diag lesion is 50% stenosed.   Mid LAD lesion is 25% stenosed.   There is moderate to severe left ventricular systolic dysfunction.   LV end  diastolic pressure is normal.   The left ventricular ejection fraction is 30-35% by visual estimate.   There is no aortic valve stenosis.   Aortic saturation 91%, PA saturation 72%, mean RA pressure 4 mmHg, PA pressure 21/12, mean PA pressure 17 mmHg, mean pulmonary capillary wedge pressure 8 mmHg, cardiac output 8.15 L/min, cardiac index 3.18. 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  Normal right heart pressures.  Continue aggressive medical therapy for LV dysfunction, atrial fibrillation and coronary artery disease.  If he does develop significant heart failure symptoms, would have to consider biventricular pacing given his left bundle branch block.   ECHOCARDIOGRAM COMPLETE  Result Date: 08/23/2022    ECHOCARDIOGRAM REPORT   Patient Name:   SAFARI WALSINGHAM Date of Exam: 08/23/2022 Medical Rec #:  [id number]       Height:       75.0 in Accession #:    [id number]      Weight:       301.0 lb Date of Birth:  1950-08-26        BSA:          2.611 m Patient Age:    71 years        BP:           134/68 mmHg Patient Gender: M               HR:           52 bpm. Exam Location:  [address] Procedure: 2D Echo, Cardiac Doppler and Color Doppler Indications:    I48.91 Atrial fibrillation  History:        Patient has prior history of Echocardiogram examinations, most                 recent 04/17/2022. Arrythmias:Atrial Fibrillation and LBBB; Risk                 Factors:Morbid obesity, Hypertension, Dyslipidemia and Former                 Smoker.  Sonographer:    Samule Ohm RDCS Referring Phys: [id number] JAYADEEP S VARANASI IMPRESSIONS  1. Left ventricular ejection fraction, by estimation, is 25 to 30%. The left ventricle has severely decreased function. The left ventricle demonstrates global hypokinesis. The left ventricular internal cavity size was mildly dilated. There is  mild concentric left ventricular hypertrophy. Left ventricular diastolic function could not be evaluated.  2. Right ventricular  systolic function is normal. The right ventricular size is mildly enlarged.  3. Left atrial size was severely dilated.  4. Right atrial size was severely dilated.  5. The mitral valve is normal in structure. Mild mitral valve regurgitation. No evidence of mitral stenosis.  6. The aortic valve is tricuspid. Aortic valve regurgitation is not visualized. No aortic stenosis is present.  7. Aortic dilatation noted. There is mild dilatation of the aortic root, measuring 41 mm.  8. The inferior vena cava is normal in size with greater than 50% respiratory variability, suggesting right atrial pressure of 3 mmHg. FINDINGS  Left Ventricle: Left ventricular ejection fraction, by estimation, is 25 to 30%. The left ventricle has severely decreased function. The left ventricle demonstrates global hypokinesis. The left ventricular internal cavity size was mildly dilated. There is mild concentric left ventricular hypertrophy. Abnormal (paradoxical) septal motion, consistent with left bundle branch block. Left ventricular diastolic function could not be evaluated due to atrial fibrillation. Left ventricular diastolic function could not be evaluated. Right Ventricle: The right ventricular size is mildly enlarged. Right ventricular systolic function is normal. Left Atrium: Left atrial size was severely dilated. Right Atrium: Right atrial size was severely dilated. Pericardium: There is no evidence of pericardial effusion. Mitral Valve: The mitral valve is normal in structure. Mild mitral valve regurgitation. No evidence of mitral valve stenosis. Tricuspid Valve: The tricuspid valve is normal in structure. Tricuspid valve regurgitation is trivial. No evidence of tricuspid stenosis. Aortic Valve: The aortic valve is tricuspid. Aortic valve regurgitation is not visualized. No aortic stenosis is present. Pulmonic Valve: The pulmonic valve was normal in structure. Pulmonic valve regurgitation is not visualized. No evidence of pulmonic  stenosis. Aorta: Aortic dilatation noted. There is mild dilatation of the aortic root, measuring 41 mm. Venous: The inferior vena cava is normal in size with greater than 50% respiratory variability, suggesting right atrial pressure of 3 mmHg. IAS/Shunts: No atrial level shunt detected by color flow Doppler.  LEFT VENTRICLE PLAX 2D LVIDd:         5.80 cm   Diastology LVIDs:         4.90 cm   LV e' medial:    8.40 cm/s LV PW:         1.40 cm   LV E/e' medial:  10.3 LV IVS:        1.30 cm   LV e' lateral:   11.00 cm/s LVOT diam:     2.10 cm   LV E/e' lateral: 7.9 LV SV:         48 LV SV Index:   18 LVOT Area:     3.46 cm  RIGHT VENTRICLE             IVC RV S prime:     11.83 cm/s  IVC diam: 1.10 cm TAPSE (M-mode): 1.8 cm LEFT ATRIUM              Index        RIGHT ATRIUM           Index LA diam:        6.00 cm  2.30 cm/m   RA Pressure: 3.00 mmHg LA Vol (A2C):   150.0 ml 57.45 ml/m  RA Area:     30.40 cm LA Vol (A4C):   139.0 ml 53.24 ml/m  RA Volume:   112.00 ml 42.90 ml/m  LA Biplane Vol: 144.0 ml 55.16 ml/m  AORTIC VALVE LVOT Vmax:   76.30 cm/s LVOT Vmean:  51.060 cm/s LVOT VTI:    0.138 m  AORTA Ao Root diam: 4.10 cm Ao Asc diam:  3.80 cm MITRAL VALVE               TRICUSPID VALVE MV Area (PHT): 3.87 cm    Estimated RAP:  3.00 mmHg MV Decel Time: 196 msec MV E velocity: 86.88 cm/s  SHUNTS                            Systemic VTI:  0.14 m                            Systemic Diam: 2.10 cm Olga Millers MD Electronically signed by Olga Millers MD Signature Date/Time: 08/23/2022/8:53:15 AM    Final        This encounter employed real-time, collaborative documentation. The patient actively reviewed and updated their medical record on a shared screen, ensuring transparency and facilitating joint problem-solving for the problem list, overview, and plan. This approach promotes accurate, informed care. The treatment plan was discussed and reviewed in detail, including medication safety, potential side effects, and  all patient questions. We confirmed understanding and comfort with the plan. Follow-up instructions were established, including contacting the office for any concerns, returning if symptoms worsen, persist, or new symptoms develop, and precautions for potential emergency department visits. ----------------------------------------------------- Lula Olszewski, MD  10/11/2022 7:46 PM  [facility]:  [phone]

## 2022-10-11 NOTE — Assessment & Plan Note (Addendum)
Independently reviewed left heart catheterization findings with patient and reinforced importance of aggressive coronary artery disease medication(s) management. Encouraged patient to follow up closely with Cardiologist Lance Muss, MD Encouraged patient to to try to increase rosuvastatin if tolerated Encouraged patient to continue(s) with Repatha Gave counseling for daily avocado. No signs of CHF flare for now so plan is to not go forward with biventricular pacing per Dr. Eldridge Dace, explained to patient.

## 2022-10-11 NOTE — Patient Instructions (Addendum)
Some fish, including salmon, sole, tuna, catfish, red snapper, tilapia, flounder, and whitefish are lower in purine than other types of fish, and can be included in your diet in moderation (two to three times per week) if you are not consuming other purine-rich food  VISIT SUMMARY:  During your recent visit, we discussed your ongoing management of gout, hypertension, hyperlipidemia, coronary artery disease, and weight management. You reported significant improvement in your gout symptoms and a notable weight loss. We also discussed your dietary changes and medication adjustments.  YOUR PLAN:  -GOUT: Your gout symptoms are improving. We will increase your allopurinol dosage to 300mg  daily to prevent future gout attacks. Continue taking colchicine as needed for acute flares and limit foods rich in purines.  -HYPERTENSION: Your blood pressure readings have been variable, possibly due to recent prednisone use. Continue taking your current blood pressure medications and monitor your blood pressure regularly at home.  -HYPERLIPIDEMIA: You are on medication for high cholesterol. We will increase your fluvastatin dosage to 40mg  daily. Continue taking Repatha as prescribed and increase your intake of omega-3 fatty acids and avocados and fatty fish  -CORONARY ARTERY DISEASE: You have a history of heart disease with multiple blockages. We will continue your current heart medications and blood thinners. Regular exercise is encouraged as tolerated.  Increasing the medication(s) and dietary changes should help the partial blockages stabilize and/or dissolve.  -WEIGHT MANAGEMENT: You have lost significant weight with the use of Wegovy. Continue taking Wegovy and exercising regularly for weight management.  INSTRUCTIONS:  We will order labs for uric acid and cholesterol levels in August. Please schedule a follow-up appointment after you receive your lab results in August or October.

## 2022-10-11 NOTE — Assessment & Plan Note (Addendum)
Acute gout podagra flare resolving on today's exam- 97.2% of pain is gone now per patient report  Advised patient to increase allopurinol 100-300 to hopefully prevent further recurrences (failure of diet control) Ok to stop colchicine, prednisone finished Discussed diet recommendations for this in context of coronary artery disease dietary recommendations.

## 2022-10-19 ENCOUNTER — Telehealth: Payer: Self-pay | Admitting: Internal Medicine

## 2022-10-19 NOTE — Telephone Encounter (Signed)
Patient requests that [facility] dosage be increased from 1 mg to 1.7 mg and sent to [facility] at [address] [place], [place] [address] when next refill is due. States he wants to get ahead of it since med is so hard to obtain.

## 2022-10-19 NOTE — Telephone Encounter (Signed)
Please see message below and advise.

## 2022-10-20 ENCOUNTER — Other Ambulatory Visit: Payer: Self-pay | Admitting: Internal Medicine

## 2022-10-20 MED ORDER — WEGOVY 1.7 MG/0.75ML ~~LOC~~ SOAJ
1.7000 mg | SUBCUTANEOUS | 5 refills | Status: DC
Start: 2022-10-20 — End: 2023-03-14

## 2022-10-30 ENCOUNTER — Encounter: Payer: Self-pay | Admitting: Internal Medicine

## 2022-10-30 ENCOUNTER — Ambulatory Visit: Payer: Medicare Other | Admitting: Internal Medicine

## 2022-10-30 VITALS — BP 100/78 | HR 63 | Temp 97.8°F | Ht 75.0 in | Wt 288.6 lb

## 2022-10-30 DIAGNOSIS — M1A2721 Drug-induced chronic gout, left ankle and foot, with tophus (tophi): Secondary | ICD-10-CM

## 2022-10-30 LAB — BASIC METABOLIC PANEL
BUN: 13 mg/dL (ref 6–23)
CO2: 29 mEq/L (ref 19–32)
Calcium: 9.7 mg/dL (ref 8.4–10.5)
Chloride: 104 mEq/L (ref 96–112)
Creatinine, Ser: 0.88 mg/dL (ref 0.40–1.50)
GFR: 85.52 mL/min (ref >60.00)
Glucose, Bld: 100 mg/dL — ABNORMAL HIGH (ref 70–99)
Potassium: 5.1 mEq/L (ref 3.5–5.1)
Sodium: 139 mEq/L (ref 135–145)

## 2022-10-30 LAB — URIC ACID: Uric Acid, Serum: 4.1 mg/dL (ref 4.0–7.8)

## 2022-10-30 MED ORDER — METHYLPREDNISOLONE ACETATE 80 MG/ML IJ SUSP
80.0000 mg | Freq: Once | INTRAMUSCULAR | Status: AC
Start: 2022-10-30 — End: 2022-10-30
  Administered 2022-10-30: 80 mg via INTRAMUSCULAR

## 2022-10-30 MED ORDER — PREDNISONE 20 MG PO TABS
ORAL_TABLET | ORAL | 0 refills | Status: DC
Start: 2022-10-30 — End: 2022-12-22

## 2022-10-30 MED ORDER — KETOROLAC TROMETHAMINE 60 MG/2ML IM SOLN
60.0000 mg | Freq: Once | INTRAMUSCULAR | Status: AC
Start: 2022-10-30 — End: 2022-10-30
  Administered 2022-10-30: 60 mg via INTRAMUSCULAR

## 2022-10-30 NOTE — Patient Instructions (Addendum)
It was a pleasure seeing you today! Your health and satisfaction are our top priorities.  Isaiah Hew, MD  Your Providers PCP: Lula Olszewski, MD,  [phone]) Referring Provider: Lula Olszewski, MD,  [phone]) Care Team Provider: Corky Crafts, MD,  [phone]) Care Team Provider: Crista Elliot, MD,  [phone]) Care Team Provider: Diamantina Monks, MD,  [phone]) Care Team Provider: Ronnette Juniper, MD,  [phone]) Care Team Provider: Marcene Corning, MD,  [phone]) Care Team Provider: Santo Held  [phone]) Care Team Provider: Claria Dice, MD,  [phone]) Care Team Provider: Corky Crafts, MD,  [phone])  VISIT SUMMARY:  During your visit, we discussed your severe gout flare-up and chronic pain. Despite taking colchicine and allopurinol, your symptoms have not improved. We have decided to administer a high dose steroid injection and start you on oral Prednisone. We also discussed your concerns about using prescribed opioids for pain management.  YOUR PLAN:  -GOUT FLARE: Gout is a type of arthritis that causes painful inflammation in the joints. We will administer a steroid injection and start you on Prednisone, a medication that can help reduce inflammation. We will also continue your current medications, colchicine and allopurinol. We will check your uric acid levels and kidney function today, and may adjust your medication based on the results. We advise you to avoid foods high in purines, which can trigger gout flares.  -CHRONIC PAIN MANAGEMENT: We understand your concerns about using opioids due to the risk of addiction. However, they can be helpful for managing severe pain, especially to aid sleep. We have discussed the importance of using these medications responsibly.  INSTRUCTIONS:  We have scheduled a follow-up appointment in one week to assess your response to the treatment. In the meantime, we advise you to cease  activities such as bicycle riding and using a clutch that may exacerbate your gout flares. We also encourage you to record your dietary intake to identify potential triggers for gout flares.   NEXT STEPS: [x]  Early Intervention: Schedule sooner appointment, call our on-call services, or go to emergency room if there is any significant Increase in pain or discomfort New or worsening symptoms Sudden or severe changes in your health [x]  Flexible Follow-Up: We recommend a Return in about 1 week (around 11/06/2022) for close follow up acute illness. for optimal routine care. This allows for progress monitoring and treatment adjustments. [x]  Preventive Care: Schedule your annual preventive care visit! It's typically covered by insurance and helps identify potential health issues early. [x]  Lab & X-ray Appointments: Incomplete tests scheduled today, or call to schedule. X-rays: [facility] (M-F, 8:30am-noon or 1pm-5pm). [x]  Medical Information Release: Sign a release form at front desk to obtain relevant medical information we don't have.  MAKING THE MOST OF OUR FOCUSED 20 MINUTE APPOINTMENTS: [x]   Clearly state your top concerns at the beginning of the visit to focus our discussion [x]   If you anticipate you will need more time, please inform the front desk during scheduling - we can book multiple appointments in the same week. [x]   If you have transportation problems- use our convenient video appointments or ask about transportation support. [x]   We can get down to business faster if you use MyChart to update information before the visit and submit non-urgent questions before your visit. Thank you for taking the time to provide details through MyChart.  Let our nurse know and she can import this information into your encounter documents.  Arrival and Wait  Times: [x]   Arriving on time ensures that everyone receives prompt attention. [x]   Early morning (8a) and afternoon (1p)  appointments tend to have shortest wait times. [x]   Unfortunately, we cannot delay appointments for late arrivals or hold slots during phone calls.  Getting Answers and Following Up [x]   Simple Questions & Concerns: For quick questions or basic follow-up after your visit, reach [place] at [phone] or MyChart messaging. [x]   Complex Concerns: If your concern is more complex, scheduling an appointment might be best. Discuss this with the staff to find the most suitable option. [x]   Lab & Imaging Results: We'll contact you directly if results are abnormal or you don't use MyChart. Most normal results will be on MyChart within 2-3 business days, with a review message from Dr. Jon Billings. Haven't heard back in 2 weeks? Need results sooner? Contact us at [phone]. [x]   Referrals: Our referral coordinator will manage specialist referrals. The specialist's office should contact you within 2 weeks to schedule an appointment. Call us if you haven't heard from them after 2 weeks.  Staying Connected [x]   MyChart: Activate your MyChart for the fastest way to access results and message [place]. See the last page of this paperwork for instructions on how to activate.  Bring to Your Next Appointment [x]   Medications: Please bring all your medication bottles to your next appointment to ensure we have an accurate record of your prescriptions. [x]   Health Diaries: If you're monitoring any health conditions at home, keeping a diary of your readings can be very helpful for discussions at your next appointment.  Billing [x]   X-ray & Lab Orders: These are billed by separate companies. Contact the invoicing company directly for questions or concerns. [x]   Visit Charges: Discuss any billing inquiries with our administrative services team.  Your Satisfaction Matters [x]   Share Your Experience: We strive for your satisfaction! If you have any complaints, or preferably compliments, please let Dr. Jon Billings know directly or  contact our Practice Administrators, Edwena Felty or [employer], by asking at the front desk.   Reviewing Your Records [x]   Review this early draft of your clinical encounter notes below and the final encounter summary tomorrow on MyChart after its been completed.  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

## 2022-10-30 NOTE — Assessment & Plan Note (Addendum)
  Gout Flare: Severe pain and inflammation are likely due to dietary factors and mechanical stress from using a clutch. We will administer a high dose steroid injection today and start oral Prednisone, with caution regarding potential heartburn and stomach irritation. Colchicine and Allopurinol will be continued, and the use of crutches is considered to offload weight and reduce mechanical stress on the foot. Uric acid levels and kidney function will be checked today, and depending on the lab results, we may increase the Allopurinol dose. Strict dietary modifications are advised to avoid foods high in purines. A follow-up in one week is scheduled to assess the response to treatment.  Chronic Pain Management: He has an existing prescription for opioids but is hesitant to use them due to fear of addiction. We advise using opioids as needed for severe pain, particularly to aid sleep, and have counseled him on the risks of opioid addiction and the importance of using the medication responsibly.  General Health Maintenance: He is advised to cease activities such as bicycle riding and using a clutch that may exacerbate gout flares, at least until he is three months gout flare-free. We encourage him to record dietary intake to identify potential triggers for gout flares.     Will do close follow up for severe current flare, 1 week(s).

## 2022-10-30 NOTE — Progress Notes (Signed)
[facility]: [phone]   Routine Medical Office Visit  Patient:  Isaiah Jones      Age: 72 y.o.       Sex:  male  Date:   10/30/2022 Patient Care Team: Lula Olszewski, MD as PCP - General (Internal Medicine) Corky Crafts, MD as PCP - Cardiology (Cardiology) Corky Crafts, MD as Consulting Physician (Cardiology) Crista Elliot, MD as Consulting Physician (Urology) Diamantina Monks, MD as Referring Physician (Gastroenterology) Ronnette Juniper, MD as Referring Physician (Pulmonary Disease) Marcene Corning, MD as Consulting Physician (Orthopedic Surgery) Santo Held as Consulting Physician (Dentistry) Claria Dice, MD as Attending Physician (Physical Medicine and Rehabilitation) Today's Healthcare Provider: Lula Olszewski, MD   Assessment and Plan:    Owyn was seen today for gout flare up.  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 Tries to follow diet Avoiding beer now.  Assessment & Plan:    Gout Flare: Severe pain and inflammation are likely due to dietary factors and mechanical stress from using a clutch. We will administer a high dose steroid injection today and start oral Prednisone, with caution regarding potential heartburn and stomach irritation. Colchicine and Allopurinol will be continued, and the use of crutches is considered to offload weight and reduce mechanical stress on the foot. Uric acid levels and kidney function will be checked today, and depending on the lab results, we may increase the Allopurinol dose. Strict dietary modifications are advised to avoid foods high in purines. A follow-up in one week is scheduled to assess the response to treatment.  Chronic Pain Management: He has an existing prescription for opioids but is hesitant to use them due to fear of addiction. We advise using  opioids as needed for severe pain, particularly to aid sleep, and have counseled him on the risks of opioid addiction and the importance of using the medication responsibly.  General Health Maintenance: He is advised to cease activities such as bicycle riding and using a clutch that may exacerbate gout flares, at least until he is three months gout flare-free. We encourage him to record dietary intake to identify potential triggers for gout flares.     Will do close follow up for severe current flare, 1 week(s).  Orders: -     methylPREDNISolone Acetate -     Basic metabolic panel -     Uric acid -     Ketorolac Tromethamine -     predniSONE; Take 2 pills for 3 days, 1 pill for 4 days  Dispense: 10 tablet; Refill: 0     Recommended follow up: Return in about 1 week (around 11/06/2022) for close follow up acute illness.  Future Appointments  Date Time Provider Department Center  11/09/2022  8:00 AM Lula Olszewski, MD [facility]  12/22/2022  8:40 AM Lula Olszewski, MD [facility]  04/03/2023  8:40 AM Corky Crafts, MD [facility]           Clinical Presentation:    72 y.o. male who has Persistent atrial fibrillation (HCC); OSA on CPAP; Dyslipidemia; Erectile dysfunction; Essential hypertension; Gout; Osteoarthritis; Seasonal allergies; Depression, major, in remission (HCC) with anxiety; Vitamin D deficiency; S/P laparoscopic sleeve gastrectomy; Primary localized osteoarthritis of right hip; Former smoker; Chronic cough; Dysphagia; Hyperlipidemia; Hypertriglyceridemia; LBBB (left bundle branch block); Hypertensive heart disease with heart failure (HCC); Anticoagulant long-term use;  Achalasia of esophagus; Chronic HFrEF (heart failure with reduced ejection fraction) (HCC); Presbycusis; Laryngopharyngeal reflux (LPR); Insomnia with sleep apnea; History of arthritis; Foot pain, right; Blood in urine; Allergic rhinitis; Morbid obesity (HCC); Hypertension; At risk for falls;  and CAD (coronary artery disease) on their problem list. His reasons/main concerns/chief complaints for today's office visit are Gout flare up (Has taken colchicine for the last four days only, has been taking as needed.)   AI-Extracted: Discussed the use of AI scribe software for clinical note transcription with the patient, who gave verbal consent to proceed.  History of Present Illness   The patient, with a known history of gout, presents with a severe flare-up. He describes the pain as excruciating and reports difficulty walking, managing only to hobble around. The patient attributes the flare to a combination of dietary factors and physical strain from using a clutch pedal. He denies any significant meat or beer consumption, both known triggers for gout.  Despite taking colchicine and allopurinol, the patient's symptoms have not improved. He reports that the affected area has remained red and inflamed, suggesting persistent gouty arthritis. The patient also mentions having gout in his fingers, indicating a severe and widespread manifestation of the disease.  The patient has been managing his pain with over-the-counter medications but has refrained from taking prescribed opioids due to concerns about addiction. He reports disrupted sleep due to the pain, often waking up in shifts.  The patient has a history of engaging in physical activities such as riding a bicycle and operating a tractor, both of which he believes may have contributed to his current flare. He expresses concern about the potential need to discontinue these activities to manage his gout.  The patient is also on a regimen of other medications, the details of which are not specified in the conversation. He questions whether these could be contributing to his gout flare.        Reviewed chart data: Past Medical History:  Diagnosis Date   Anxiety    Arthritis, degenerative    hip,shoulders, hands, back   Benign hypertensive  heart disease without congestive heart failure    BMI 45.0-49.9, adult (HCC) 03/09/2018   CHF (congestive heart failure) (HCC)    Colon polyps    Complication of anesthesia    Depression    Dyslipidemia    Dysrhythmia 2017   A fib   GERD (gastroesophageal reflux disease)    Glaucoma    early stage   History of 2019 novel coronavirus disease (COVID-19) 06/2019   History of kidney stones    Hypertension    Hypertriglyceridemia 03/14/2022   Lab Results Component Value Date/Time  TRIG 154.0 (H) 03/13/2022 08:53 AM  TRIG 188.0 (H) 06/14/2021 09:38 AM  TRIG 203.0 (H) 06/11/2020 09:38 AM  TRIG 166.0 (H) 06/11/2019 09:44 AM  TRIG 177 (H) 02/18/2019 08:36 AM     OSA on CPAP    C-Pap   Persistent atrial fibrillation (HCC)    Echo 3/18: Mild LVH, EF 55-60, normal wall motion, grade 1 diastolic dysfunction, mild LAE   Personal history of gout 05/23/2019   Pneumonia    childhood   PONV (postoperative nausea and vomiting)    after 1st knee surgery only   Rhinitis, allergic     Outpatient Medications Prior to Visit  Medication Sig   acetaminophen (TYLENOL) 500 MG tablet Take 500-1,000 mg by mouth every 6 (six) hours as needed (for pain.).   allopurinol (ZYLOPRIM) 300 MG tablet Take 1  tablet (300 mg total) by mouth daily.   amoxicillin (AMOXIL) 250 MG capsule Take 1,000 mg by mouth See admin instructions. Take 1000 mg 1 hour prior to dental work   Azelastine HCl 137 MCG/SPRAY SOLN Place 2 sprays into both nostrils daily as needed (allergies).   Calcium Carb-Cholecalciferol (CALCIUM-VITAMIN D) 600-400 MG-UNIT TABS Take 1 tablet by mouth 3 (three) times daily.   colchicine 0.6 MG tablet Take 1 tablet (0.6 mg total) by mouth daily.   diclofenac Sodium (VOLTAREN) 1 % GEL Apply 4 g topically 4 (four) times daily as needed.   diphenhydramine-acetaminophen (TYLENOL PM) 25-500 MG TABS tablet Take 2 tablets by mouth at bedtime.    Evolocumab (REPATHA) 140 MG/ML SOSY Inject 140 mg into the skin  every 14 (fourteen) days.   fexofenadine (ALLEGRA) 180 MG tablet Take 180 mg by mouth daily as needed for allergies or rhinitis.   Ginkgo 60 MG TABS Take 60 mg by mouth daily.   latanoprost (XALATAN) 0.005 % ophthalmic solution Place 1 drop into both eyes at bedtime.    methocarbamol (ROBAXIN) 750 MG tablet Take 1 tablet (750 mg total) by mouth every 6 (six) hours as needed for muscle spasms.   metoprolol succinate (TOPROL XL) 25 MG 24 hr tablet Take 1 tablet (25 mg total) by mouth daily.   mometasone (NASONEX) 50 MCG/ACT nasal spray Place 1 spray into the nose at bedtime.   Multiple Vitamin (MULTIVITAMIN WITH MINERALS) TABS tablet Take 1 tablet by mouth 2 (two) times daily. Bariatric Multivitamin   Multiple Vitamins-Minerals (AIRBORNE PO) Take 1 tablet by mouth daily as needed (immune health).    Omega-3 Fatty Acids (FISH OIL PO) Take by mouth.   rivaroxaban (XARELTO) 20 MG TABS tablet Take 1 tablet (20 mg total) by mouth daily with supper.   rosuvastatin (CRESTOR) 40 MG tablet Take 1 tablet (40 mg total) by mouth daily.   sacubitril-valsartan (ENTRESTO) 49-51 MG Take 1 tablet by mouth 2 (two) times daily.   Semaglutide-Weight Management (WEGOVY) 1.7 MG/0.75ML SOAJ Inject 1.7 mg into the skin once a week.   sertraline (ZOLOFT) 50 MG tablet TAKE 1 TABLET DAILY   spironolactone (ALDACTONE) 25 MG tablet Take 0.5 tablets (12.5 mg total) by mouth daily.   thiamine (VITAMIN B-1) 100 MG tablet Take 100 mg by mouth daily.   [DISCONTINUED] rosuvastatin (CRESTOR) 20 MG tablet TAKE 1 TABLET DAILY   No facility-administered medications prior to visit.         Clinical Data Analysis:   Physical Exam  BP 100/78 (BP Location: Left Arm, Patient Position: Sitting)   Pulse 63   Temp 97.8 F (36.6 C) (Temporal)   Ht 6\' 3"  (1.905 m)   Wt 288 lb 9.6 oz (130.9 kg)   SpO2 95%   BMI 36.07 kg/m  Wt Readings from Last 10 Encounters:  10/30/22 288 lb 9.6 oz (130.9 kg)  10/11/22 286 lb 12.8 oz (130.1 kg)   10/02/22 293 lb 3.2 oz (133 kg)  10/02/22 289 lb (131.1 kg)  09/22/22 289 lb (131.1 kg)  09/18/22 294 lb (133.4 kg)  07/25/22 (!) 301 lb (136.5 kg)  06/16/22 (!) 312 lb 3.2 oz (141.6 kg)  06/13/22 (!) 313 lb (142 kg)  05/26/22 (!) 316 lb (143.3 kg)   Vital signs reviewed.  Nursing notes reviewed. Weight trend reviewed. Abnormalities and Problem-Specific physical exam findings:  large area around left metatarsophalangeal joint great toe on left, tender erythematous and swollen.  General Appearance:  No acute distress  appreciable.   Well-groomed, healthy-appearing male.  Well proportioned with no abnormal fat distribution.  Good muscle tone. Skin: Clear and well-hydrated. Pulmonary:  Normal work of breathing at rest, no respiratory distress apparent. SpO2: 95 %  Musculoskeletal: All extremities are intact.  Neurological:  Awake, alert, oriented, and engaged.  No obvious focal neurological deficits or cognitive impairments.  Sensorium seems unclouded.   Speech is clear and coherent with logical content. Psychiatric:  Appropriate mood, pleasant and cooperative demeanor, thoughtful and engaged during the exam  Results Reviewed:    Results for orders placed or performed in visit on 10/30/22  Basic Metabolic Panel (BMET)  Result Value Ref Range   Sodium 139 135 - 145 mEq/L   Potassium 5.1 3.5 - 5.1 mEq/L   Chloride 104 96 - 112 mEq/L   CO2 29 19 - 32 mEq/L   Glucose, Bld 100 (H) 70 - 99 mg/dL   BUN 13 6 - 23 mg/dL   Creatinine, Ser 1.61 0.40 - 1.50 mg/dL   GFR 09.60 >45.40 mL/min   Calcium 9.7 8.4 - 10.5 mg/dL  Uric acid  Result Value Ref Range   Uric Acid, Serum 4.1 4.0 - 7.8 mg/dL    Office Visit on 98/03/9146  Component Date Value   Sodium 10/30/2022 139    Potassium 10/30/2022 5.1    Chloride 10/30/2022 104    CO2 10/30/2022 29    Glucose, Bld 10/30/2022 100 (H)    BUN 10/30/2022 13    Creatinine, Ser 10/30/2022 0.88    GFR 10/30/2022 85.52    Calcium 10/30/2022 9.7     Uric Acid, Serum 10/30/2022 4.1   Admission on 09/22/2022, Discharged on 09/22/2022  Component Date Value   pH, Ven 09/22/2022 7.370    pCO2, Ven 09/22/2022 45.8    pO2, Ven 09/22/2022 39    Bicarbonate 09/22/2022 26.4    TCO2 09/22/2022 28    O2 Saturation 09/22/2022 71    Acid-Base Excess 09/22/2022 1.0    Sodium 09/22/2022 140    Potassium 09/22/2022 3.7    Calcium, Ion 09/22/2022 1.34    HCT 09/22/2022 45.0    Hemoglobin 09/22/2022 15.3    Sample type 09/22/2022 VENOUS    Comment 09/22/2022 NOTIFIED PHYSICIAN    Activated Clotting Time 09/22/2022 239    pH, Arterial 09/22/2022 7.370    pCO2 arterial 09/22/2022 43.7    pO2, Arterial 09/22/2022 63 (L)    Bicarbonate 09/22/2022 25.3    TCO2 09/22/2022 27    O2 Saturation 09/22/2022 91    Acid-Base Excess 09/22/2022 0.0    Sodium 09/22/2022 140    Potassium 09/22/2022 3.7    Calcium, Ion 09/22/2022 1.31    HCT 09/22/2022 46.0    Hemoglobin 09/22/2022 15.6    Sample type 09/22/2022 ARTERIAL    pH, Ven 09/22/2022 7.369    pCO2, Ven 09/22/2022 44.9    pO2, Ven 09/22/2022 40    Bicarbonate 09/22/2022 25.9    TCO2 09/22/2022 27    O2 Saturation 09/22/2022 72    Acid-Base Excess 09/22/2022 0.0    Sodium 09/22/2022 140    Potassium 09/22/2022 3.7    Calcium, Ion 09/22/2022 1.35    HCT 09/22/2022 45.0    Hemoglobin 09/22/2022 15.3    Sample type 09/22/2022 VENOUS   Office Visit on 09/18/2022  Component Date Value   Glucose 09/18/2022 80    BUN 09/18/2022 13    Creatinine, Ser 09/18/2022 0.99    eGFR 09/18/2022 80  BUN/Creatinine Ratio 09/18/2022 13    Sodium 09/18/2022 141    Potassium 09/18/2022 4.3    Chloride 09/18/2022 103    CO2 09/18/2022 23    Calcium 09/18/2022 9.6    WBC 09/18/2022 6.0    RBC 09/18/2022 4.93    Hemoglobin 09/18/2022 16.2    Hematocrit 09/18/2022 48.6    MCV 09/18/2022 99 (H)    MCH 09/18/2022 32.9    MCHC 09/18/2022 33.3    RDW 09/18/2022 12.9    Platelets 09/18/2022 179   Ancillary  Procedure on 08/23/2022  Component Date Value   Area-P 1/2 08/23/2022 3.87    S' Lateral 08/23/2022 4.90    Est EF 08/23/2022 25 - 30%   Orders Only on 06/02/2022  Component Date Value   Glucose 06/02/2022 90    BUN 06/02/2022 17    Creatinine, Ser 06/02/2022 0.92    eGFR 06/02/2022 88    BUN/Creatinine Ratio 06/02/2022 18    Sodium 06/02/2022 141    Potassium 06/02/2022 4.4    Chloride 06/02/2022 102    CO2 06/02/2022 23    Calcium 06/02/2022 9.8    specimen status report 06/02/2022 Comment   Ancillary Procedure on 04/17/2022  Component Date Value   S' Lateral 04/17/2022 4.10   Lab on 03/13/2022  Component Date Value   PSA 03/13/2022 0.13    VITD 03/13/2022 37.40    TSH 03/13/2022 1.36    Methylmalonic Acid, Quant 03/13/2022 168    Cholesterol 03/13/2022 148    Triglycerides 03/13/2022 154.0 (H)    HDL 03/13/2022 48.70    VLDL 03/13/2022 30.8    LDL Cholesterol 03/13/2022 68    Total CHOL/HDL Ratio 03/13/2022 3    NonHDL 03/13/2022 98.80    Sodium 03/13/2022 139    Potassium 03/13/2022 4.5    Chloride 03/13/2022 101    CO2 03/13/2022 32    Glucose, Bld 03/13/2022 93    BUN 03/13/2022 15    Creatinine, Ser 03/13/2022 0.92    Total Bilirubin 03/13/2022 0.7    Alkaline Phosphatase 03/13/2022 58    AST 03/13/2022 26    ALT 03/13/2022 21    Total Protein 03/13/2022 6.8    Albumin 03/13/2022 4.4    GFR 03/13/2022 83.10    Calcium 03/13/2022 10.0    WBC 03/13/2022 5.3    RBC 03/13/2022 5.13    Platelets 03/13/2022 174.0    Hemoglobin 03/13/2022 16.7    HCT 03/13/2022 49.6    MCV 03/13/2022 96.7    MCHC 03/13/2022 33.7    RDW 03/13/2022 13.9    No image results found.   CARDIAC CATHETERIZATION  Result Date: 09/22/2022   Prox RCA lesion is 50% stenosed.  RFR 0.99.  Not significant.   Ost Cx to Prox Cx lesion is 25% stenosed.   1st Diag lesion is 50% stenosed.   Mid LAD lesion is 25% stenosed.   There is moderate to severe left ventricular systolic dysfunction.    LV end diastolic pressure is normal.   The left ventricular ejection fraction is 30-35% by visual estimate.   There is no aortic valve stenosis.   Aortic saturation 91%, PA saturation 72%, mean RA pressure 4 mmHg, PA pressure 21/12, mean PA pressure 17 mmHg, mean pulmonary capillary wedge pressure 8 mmHg, cardiac output 8.15 L/min, cardiac index 3.18. 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  Normal right heart pressures.  Continue aggressive medical therapy for LV dysfunction, atrial fibrillation and coronary artery disease.  If he  does develop significant heart failure symptoms, would have to consider biventricular pacing given his left bundle branch block.   ECHOCARDIOGRAM COMPLETE  Result Date: 08/23/2022    ECHOCARDIOGRAM REPORT   Patient Name:   NIM GELWICKS Date of Exam: 08/23/2022 Medical Rec #:  [id number]       Height:       75.0 in Accession #:    [id number]      Weight:       301.0 lb Date of Birth:  06-23-1950        BSA:          2.611 m Patient Age:    71 years        BP:           134/68 mmHg Patient Gender: M               HR:           52 bpm. Exam Location:  [address] Procedure: 2D Echo, Cardiac Doppler and Color Doppler Indications:    I48.91 Atrial fibrillation  History:        Patient has prior history of Echocardiogram examinations, most                 recent 04/17/2022. Arrythmias:Atrial Fibrillation and LBBB; Risk                 Factors:Morbid obesity, Hypertension, Dyslipidemia and Former                 Smoker.  Sonographer:    Samule Ohm RDCS Referring Phys: [id number] JAYADEEP S VARANASI IMPRESSIONS  1. Left ventricular ejection fraction, by estimation, is 25 to 30%. The left ventricle has severely decreased function. The left ventricle demonstrates global hypokinesis. The left ventricular internal cavity size was mildly dilated. There is mild concentric left ventricular hypertrophy. Left ventricular diastolic function could not be evaluated.  2. Right  ventricular systolic function is normal. The right ventricular size is mildly enlarged.  3. Left atrial size was severely dilated.  4. Right atrial size was severely dilated.  5. The mitral valve is normal in structure. Mild mitral valve regurgitation. No evidence of mitral stenosis.  6. The aortic valve is tricuspid. Aortic valve regurgitation is not visualized. No aortic stenosis is present.  7. Aortic dilatation noted. There is mild dilatation of the aortic root, measuring 41 mm.  8. The inferior vena cava is normal in size with greater than 50% respiratory variability, suggesting right atrial pressure of 3 mmHg. FINDINGS  Left Ventricle: Left ventricular ejection fraction, by estimation, is 25 to 30%. The left ventricle has severely decreased function. The left ventricle demonstrates global hypokinesis. The left ventricular internal cavity size was mildly dilated. There is mild concentric left ventricular hypertrophy. Abnormal (paradoxical) septal motion, consistent with left bundle branch block. Left ventricular diastolic function could not be evaluated due to atrial fibrillation. Left ventricular diastolic function could not be evaluated. Right Ventricle: The right ventricular size is mildly enlarged. Right ventricular systolic function is normal. Left Atrium: Left atrial size was severely dilated. Right Atrium: Right atrial size was severely dilated. Pericardium: There is no evidence of pericardial effusion. Mitral Valve: The mitral valve is normal in structure. Mild mitral valve regurgitation. No evidence of mitral valve stenosis. Tricuspid Valve: The tricuspid valve is normal in structure. Tricuspid valve regurgitation is trivial. No evidence of tricuspid stenosis. Aortic Valve: The aortic valve is tricuspid. Aortic valve regurgitation is not visualized. No aortic  stenosis is present. Pulmonic Valve: The pulmonic valve was normal in structure. Pulmonic valve regurgitation is not visualized. No evidence of  pulmonic stenosis. Aorta: Aortic dilatation noted. There is mild dilatation of the aortic root, measuring 41 mm. Venous: The inferior vena cava is normal in size with greater than 50% respiratory variability, suggesting right atrial pressure of 3 mmHg. IAS/Shunts: No atrial level shunt detected by color flow Doppler.  LEFT VENTRICLE PLAX 2D LVIDd:         5.80 cm   Diastology LVIDs:         4.90 cm   LV e' medial:    8.40 cm/s LV PW:         1.40 cm   LV E/e' medial:  10.3 LV IVS:        1.30 cm   LV e' lateral:   11.00 cm/s LVOT diam:     2.10 cm   LV E/e' lateral: 7.9 LV SV:         48 LV SV Index:   18 LVOT Area:     3.46 cm  RIGHT VENTRICLE             IVC RV S prime:     11.83 cm/s  IVC diam: 1.10 cm TAPSE (M-mode): 1.8 cm LEFT ATRIUM              Index        RIGHT ATRIUM           Index LA diam:        6.00 cm  2.30 cm/m   RA Pressure: 3.00 mmHg LA Vol (A2C):   150.0 ml 57.45 ml/m  RA Area:     30.40 cm LA Vol (A4C):   139.0 ml 53.24 ml/m  RA Volume:   112.00 ml 42.90 ml/m LA Biplane Vol: 144.0 ml 55.16 ml/m  AORTIC VALVE LVOT Vmax:   76.30 cm/s LVOT Vmean:  51.060 cm/s LVOT VTI:    0.138 m  AORTA Ao Root diam: 4.10 cm Ao Asc diam:  3.80 cm MITRAL VALVE               TRICUSPID VALVE MV Area (PHT): 3.87 cm    Estimated RAP:  3.00 mmHg MV Decel Time: 196 msec MV E velocity: 86.88 cm/s  SHUNTS                            Systemic VTI:  0.14 m                            Systemic Diam: 2.10 cm Olga Millers MD Electronically signed by Olga Millers MD Signature Date/Time: 08/23/2022/8:53:15 AM    Final        This encounter employed real-time, collaborative documentation. The patient actively reviewed and updated their medical record on a shared screen, ensuring transparency and facilitating joint problem-solving for the problem list, overview, and plan. This approach promotes accurate, informed care. The treatment plan was discussed and reviewed in detail, including medication safety, potential side  effects, and all patient questions. We confirmed understanding and comfort with the plan. Follow-up instructions were established, including contacting the office for any concerns, returning if symptoms worsen, persist, or new symptoms develop, and precautions for potential emergency department visits. ----------------------------------------------------- Lula Olszewski, MD  10/30/2022 7:47 PM  [facility]:  [phone]

## 2022-11-06 ENCOUNTER — Telehealth: Payer: Self-pay | Admitting: Internal Medicine

## 2022-11-06 NOTE — Telephone Encounter (Signed)
Patient requests to be advised if he should start taking colchicine 0.6 MG tablet  tomorrow (11/07/22).   States finished Prednisone today 11/06/22.

## 2022-11-07 NOTE — Telephone Encounter (Signed)
Informed patient of this information ?

## 2022-11-09 ENCOUNTER — Telehealth (INDEPENDENT_AMBULATORY_CARE_PROVIDER_SITE_OTHER): Payer: Medicare Other | Admitting: Internal Medicine

## 2022-11-09 ENCOUNTER — Encounter: Payer: Self-pay | Admitting: Internal Medicine

## 2022-11-09 DIAGNOSIS — M109 Gout, unspecified: Secondary | ICD-10-CM | POA: Diagnosis not present

## 2022-11-09 DIAGNOSIS — E782 Mixed hyperlipidemia: Secondary | ICD-10-CM

## 2022-11-09 NOTE — Patient Instructions (Signed)
VISIT SUMMARY:  During your recent visit, we discussed your gout flare-up and your desire to resume cycling. We also touched on your history of atrial fibrillation. Your gout symptoms have improved significantly, and you are considering continuing your medication to prevent future flare-ups as you return to cycling. We also discussed the importance of hydration and dietary modifications to manage your gout.  YOUR PLAN:  -GOUT: Gout is a type of arthritis that causes painful inflammation in the joints, often in the big toe. To manage this, we will continue your current medication, Colchicine, for another two weeks, especially during your initial biking sessions. We will also maintain your Allopurinol dosage at 300mg  daily. It's important to avoid putting pressure on the affected foot, stay well-hydrated, especially during physical activity, and consider dietary modifications, including reducing chicken intake and exploring low-purine fish options.  -HYPERLIPIDEMIA: Hyperlipidemia is a condition where there are high levels of fats (lipids) in the blood. This can increase the risk of heart disease. We haven't checked your cholesterol levels recently, so we plan to do this during your next visit.  INSTRUCTIONS:  You are scheduled for a follow-up visit on December 22, 2022, for a physical and gout follow-up. During this visit, we plan to check your uric acid levels and cholesterol. Please continue to stay well-hydrated, especially during physical activity, and consider dietary modifications to manage your gout.

## 2022-11-09 NOTE — Progress Notes (Signed)
[facility]: [phone]   Virtual Medical Office Visit - Video Telemedicine   Patient:  Isaiah Jones (Mar 09, 1951)  MRN:   [id number]      Date:   11/09/2022  Patient Care Team: Lula Olszewski, MD as PCP - General (Internal Medicine) Corky Crafts, MD as PCP - Cardiology (Cardiology) Corky Crafts, MD as Consulting Physician (Cardiology) Crista Elliot, MD as Consulting Physician (Urology) Diamantina Monks, MD as Referring Physician (Gastroenterology) Ronnette Juniper, MD as Referring Physician (Pulmonary Disease) Marcene Corning, MD as Consulting Physician (Orthopedic Surgery) Santo Held as Consulting Physician (Dentistry) Claria Dice, MD as Attending Physician (Physical Medicine and Rehabilitation) Today's Healthcare Provider: Lula Olszewski, MD   Assessment & Plan    Isaiah Jones was seen today for one week follow-up and gout.  Gout of left foot, unspecified cause, unspecified chronicity Overview: Frequent attacks of gout for many years  Taking allopurinol 300 daily Taking regular colchicine. Tries to follow diet Avoiding beer now.  Assessment & Plan: Last flare 95% better Old bike riding but limit pressurizing tender area. Taking colchicine 600 daily, encouraged patient to continue to take, discussed risks (gastrointestinal,cardiovascular, renal). Taking daily 300 mg allopurinol since October 11 2022.  Gout: He has shown improvement in his recent gout flare with Colchicine and Allopurinol. We discussed the importance of consistent medication use, hydration, and dietary modifications to prevent future flares. He expressed a desire to resume biking, which may risk re-flaring. We will continue Colchicine for another 2 weeks, especially during initial biking sessions, and maintain Allopurinol at 300mg  daily. He should avoid pressure on the affected foot, stay well-hydrated, especially during physical activity, and consider dietary modifications,  including reducing chicken intake and exploring low-purine fish options.   Reviewed data on artificial sweeteners and clarified I think these are likely safer than high fructose corn syrup. Due to patient reporting eating lots of chicken, I provided this info: The safest meats for patients with gout are those with lower purine content, such as cod, haddock, perch, pike, and sole.  High purine foods can increase serum urate levels and trigger gout attacks. These include red meat, poultry, oily fish, and organ meats like liver, kidney, and heart.  Certain fish like cod, haddock, perch, pike, and sole have lower purine content and may be safer options for patients with gout.  Consumption of less than 1 meat serving per day is associated with lower serum urate concentrations compared to those consuming greater than 2 meat servings per day.  Dietary modifications, such as reducing the consumption of high-purine foods and increasing the intake of low-fat dairy products, can be beneficial for managing gout and hyperuricemia.  It's also important to consider other dietary factors that can influence gout, such as alcohol and high-fructose soft drink consumption, which should be reduced.     Follow-up: He is scheduled for December 22, 2022, for a physical and gout follow-up. We plan to check uric acid levels and cholesterol.   Treatment plan discussed and reviewed in detail. Explained medication safety and potential side effects.  Answered all patient questions and confirmed understanding and comfort with the plan.    Subjective:   Chief Complaint / Reason for Visit:  One week follow-up and Gout (Severe flare now greatly improved)  History of Present Illness   The patient, with a history of gout and atrial fibrillation, presents with a recent flare of gout. 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 The patient has been adhering to a  gout-friendly diet, consuming mostly chicken, salmon, and salads, and has been avoiding red meat. He has also been maintaining hydration, consuming over 100 ounces of fluid daily, primarily water and sweet tea.  The patient's gout flare was severe, with significant redness and swelling in the foot, particularly around the big toe base. He reports that the symptoms have improved significantly, estimating a 95% improvement, with only a slight residual tenderness and swelling.  The patient is eager to resume physical activity, specifically cycling, which he had been avoiding due to the gout flare. He expresses concern about the potential for a gout flare triggered by the pressure exerted on the foot during cycling. The patient's cycling shoes have a cleat on the bottom, which places pressure on the ball of the foot.  The patient has been on allopurinol for gout management, with the dose recently increased from 100mg  to 300mg . He has also been taking colchicine for the recent gout flare. The patient is considering continuing colchicine for an additional two weeks as he resumes cycling to prevent a potential gout flare.  The patient also has a history of atrial fibrillation, which he believes may have been triggered by excessive consumption of Gatorade. He has been advised by his cardiologist to engage in physical activity to improve his heart health, which is a motivating factor for him to resume cycling.      Reviewed:  Tobacco  Allergies  Meds  Problems  Med Hx  Surg Hx  Fam Hx           Objective:  Physical Exam   EXTREMITIES: Redness at the base of the big toe, tenderness, slight swelling. (Per patient description)    General Appearance:  Well developed, well nourished, no acute distress, by limited video assessment Pulmonary:  No respiratory distress apparent.    Neurological:  Awake, alert. No obvious focal neurological deficits or cognitive impairments.  Sensorium seems  unclouded. Psychiatric:  Appropriate mood, pleasant demeanor  calm, articulate, good mood Problem-specific findings:  difficult to see foot well on video, looks normal despite his description of the extremities  Results Reviewed:     Latest Reference Range & Units 10/30/22 12:22  BASIC METABOLIC PANEL  Rpt !  Sodium 135 - 145 mEq/L 139  Potassium 3.5 - 5.1 mEq/L 5.1  Chloride 96 - 112 mEq/L 104  CO2 19 - 32 mEq/L 29  Glucose 70 - 99 mg/dL 161 (H)  BUN 6 - 23 mg/dL 13  Creatinine 0.96 - 0.45 mg/dL 4.09  Calcium 8.4 - 81.1 mg/dL 9.7  Uric Acid, Serum 4.0 - 7.8 mg/dL 4.1  GFR >91.47 mL/min 85.52  !: Data is abnormal (H): Data is abnormally high Rpt: View report in Results Review for more information   Latest Reference Range & Units 06/11/19 09:44 06/11/20 09:38 06/14/21 09:38 10/30/22 12:22  Uric Acid, Serum 4.0 - 7.8 mg/dL 9.4 (H) 7.3 6.8 4.1  (H): Data is abnormally high    ------------------------------------------------------ Attestation:  Today's Healthcare Provider Lula Olszewski, MD was located at office at [employer] at home. The patient was located at home. Today's Telemedicine visit was conducted via Video for 20m 47s after consent for telemedicine was obtained:  Video connection was never lost All video encounter participant identities and locations confirmed visually and verbally.  Attestation:  I have personally spent  31 minutes involved in face-to-face and non-face-to-face activities for this patient on the day  of the visit. Professional time spent includes the following activities:  Preparing to see the patient by reviewing medical records prior to and during the encounter; Obtaining, documenting, and reviewing an updated medical history; Performing a medically appropriate examination;  Evaluating, synthesizing, and documenting the available clinical information in the EMR;  Coordinating/Communicating with other health care professionals; Independently  interpreting results (not separately reported), Communicating, counseling, educating about results to the patient/family/caregiver (not separately reported); Collaboratively developing and communicating an individualized treatment plan with the patient; Placing medically necessary orders (for medications/tests/procedures/referrals);   This time was independent of any separately billable procedure(s).  The extended duration of this patient visit was medically necessary due to several factors:  The patient's health condition is multifaceted, requiring a comprehensive evaluation of patient and their past records to ensure accurate diagnosis and treatment planning; Effective patient education and communication, particularly for patients with complex care needs, often require additional time to ensure the patient (or caregivers) fully understand the care plan;  Coordination of care with other healthcare professionals and services depends on thorough documentation, extending both documentation time and visit durations.  All these factors are integral to providing high-quality patient care and ensuring optimal health outcomes.    Signed: Lula Olszewski, MD 11/09/2022 8:53 AM

## 2022-11-09 NOTE — Assessment & Plan Note (Addendum)
Last flare 95% better Old bike riding but limit pressurizing tender area. Taking colchicine 600 daily, encouraged patient to continue to take, discussed risks (gastrointestinal,cardiovascular, renal). Taking daily 300 mg allopurinol since October 11 2022.  Gout: He has shown improvement in his recent gout flare with Colchicine and Allopurinol. We discussed the importance of consistent medication use, hydration, and dietary modifications to prevent future flares. He expressed a desire to resume biking, which may risk re-flaring. We will continue Colchicine for another 2 weeks, especially during initial biking sessions, and maintain Allopurinol at 300mg  daily. He should avoid pressure on the affected foot, stay well-hydrated, especially during physical activity, and consider dietary modifications, including reducing chicken intake and exploring low-purine fish options.   Reviewed data on artificial sweeteners and clarified I think these are likely safer than high fructose corn syrup. Due to patient reporting eating lots of chicken, I provided this info: The safest meats for patients with gout are those with lower purine content, such as cod, haddock, perch, pike, and sole.  High purine foods can increase serum urate levels and trigger gout attacks. These include red meat, poultry, oily fish, and organ meats like liver, kidney, and heart.  Certain fish like cod, haddock, perch, pike, and sole have lower purine content and may be safer options for patients with gout.  Consumption of less than 1 meat serving per day is associated with lower serum urate concentrations compared to those consuming greater than 2 meat servings per day.  Dietary modifications, such as reducing the consumption of high-purine foods and increasing the intake of low-fat dairy products, can be beneficial for managing gout and hyperuricemia.  It's also important to consider other dietary factors that can influence gout, such as alcohol  and high-fructose soft drink consumption, which should be reduced.

## 2022-11-09 NOTE — Assessment & Plan Note (Signed)
Hyperlipidemia: He has not had a recent cholesterol check. We plan to check cholesterol levels during the next visit on December 22, 2022.

## 2022-11-09 NOTE — Addendum Note (Signed)
Addended by: Lula Olszewski on: 11/09/2022 09:04 AM   Modules accepted: Level of Service

## 2022-11-13 ENCOUNTER — Other Ambulatory Visit: Payer: Self-pay

## 2022-11-13 DIAGNOSIS — I4819 Other persistent atrial fibrillation: Secondary | ICD-10-CM

## 2022-11-13 MED ORDER — RIVAROXABAN 20 MG PO TABS
20.0000 mg | ORAL_TABLET | Freq: Every day | ORAL | 1 refills | Status: DC
Start: 2022-11-13 — End: 2023-05-16

## 2022-11-13 NOTE — Telephone Encounter (Signed)
Prescription refill request for Xarelto received.  Indication:afib Last office visit:6/24 Weight:130.9  kg Age:72 Scr:0.88  7/24 CrCl:138.42  ml/min  Prescription refilled

## 2022-11-20 ENCOUNTER — Telehealth: Payer: Self-pay | Admitting: Internal Medicine

## 2022-11-20 NOTE — Telephone Encounter (Signed)
Prescription Request  11/20/2022  LOV: 10/30/2022  What is the name of the medication or equipment? sertraline (ZOLOFT) 50 MG tablet   Have you contacted your pharmacy to request a refill? Yes   Which pharmacy would you like this sent to?    [facility] - [place], [place] - [facility] [address] [place] [address] Phone: [phone] Fax: [phone]    Patient notified that their request is being sent to the clinical staff for review and that they should receive a response within 2 business days.   Please advise at Mobile [phone] (mobile)

## 2022-11-22 ENCOUNTER — Other Ambulatory Visit: Payer: Self-pay

## 2022-11-22 DIAGNOSIS — F325 Major depressive disorder, single episode, in full remission: Secondary | ICD-10-CM

## 2022-11-22 MED ORDER — SERTRALINE HCL 50 MG PO TABS
50.0000 mg | ORAL_TABLET | Freq: Every day | ORAL | 1 refills | Status: DC
Start: 2022-11-22 — End: 2023-04-27

## 2022-11-22 NOTE — Telephone Encounter (Signed)
I have sent this refill to the requested pharmacy. 

## 2022-11-29 ENCOUNTER — Encounter (INDEPENDENT_AMBULATORY_CARE_PROVIDER_SITE_OTHER): Payer: Self-pay

## 2022-12-06 ENCOUNTER — Emergency Department (HOSPITAL_COMMUNITY): Payer: Medicare Other

## 2022-12-06 ENCOUNTER — Encounter (HOSPITAL_COMMUNITY): Payer: Self-pay | Admitting: Emergency Medicine

## 2022-12-06 ENCOUNTER — Emergency Department (HOSPITAL_COMMUNITY)
Admission: EM | Admit: 2022-12-06 | Discharge: 2022-12-06 | Disposition: A | Discharge status: Home / Self Care | Payer: Medicare Other | Attending: Student | Admitting: Student

## 2022-12-06 ENCOUNTER — Other Ambulatory Visit: Payer: Self-pay

## 2022-12-06 DIAGNOSIS — Z794 Long term (current) use of insulin: Secondary | ICD-10-CM | POA: Insufficient documentation

## 2022-12-06 DIAGNOSIS — Z7901 Long term (current) use of anticoagulants: Secondary | ICD-10-CM | POA: Insufficient documentation

## 2022-12-06 DIAGNOSIS — I4891 Unspecified atrial fibrillation: Secondary | ICD-10-CM | POA: Diagnosis not present

## 2022-12-06 DIAGNOSIS — R109 Unspecified abdominal pain: Secondary | ICD-10-CM | POA: Diagnosis present

## 2022-12-06 DIAGNOSIS — I10 Essential (primary) hypertension: Secondary | ICD-10-CM | POA: Diagnosis not present

## 2022-12-06 DIAGNOSIS — N201 Calculus of ureter: Secondary | ICD-10-CM | POA: Insufficient documentation

## 2022-12-06 DIAGNOSIS — Z79899 Other long term (current) drug therapy: Secondary | ICD-10-CM | POA: Diagnosis not present

## 2022-12-06 DIAGNOSIS — R079 Chest pain, unspecified: Secondary | ICD-10-CM

## 2022-12-06 LAB — BASIC METABOLIC PANEL
Anion gap: 13 (ref 5–15)
BUN: 13 mg/dL (ref 8–23)
CO2: 23 mmol/L (ref 22–32)
Calcium: 9.3 mg/dL (ref 8.9–10.3)
Chloride: 101 mmol/L (ref 98–111)
Creatinine, Ser: 1.23 mg/dL (ref 0.61–1.24)
GFR, Estimated: >60 mL/min (ref >60)
Glucose, Bld: 131 mg/dL — ABNORMAL HIGH (ref 70–99)
Potassium: 4.3 mmol/L (ref 3.5–5.1)
Sodium: 137 mmol/L (ref 135–145)

## 2022-12-06 LAB — URINALYSIS, ROUTINE W REFLEX MICROSCOPIC
Glucose, UA: NEGATIVE mg/dL
Ketones, ur: 40 mg/dL — AB
Leukocytes,Ua: NEGATIVE
Nitrite: NEGATIVE
Protein, ur: 100 mg/dL — AB
Specific Gravity, Urine: 1.025 (ref 1.005–1.030)
pH: 7 (ref 5.0–8.0)

## 2022-12-06 LAB — URINALYSIS, MICROSCOPIC (REFLEX): RBC / HPF: >50 RBC/hpf (ref 0–5)

## 2022-12-06 LAB — CBC
HCT: 49.7 % (ref 39.0–52.0)
Hemoglobin: 16.8 g/dL (ref 13.0–17.0)
MCH: 33.4 pg (ref 26.0–34.0)
MCHC: 33.8 g/dL (ref 30.0–36.0)
MCV: 98.8 fL (ref 80.0–100.0)
Platelets: 212 10*3/uL (ref 150–400)
RBC: 5.03 MIL/uL (ref 4.22–5.81)
RDW: 15 % (ref 11.5–15.5)
WBC: 9 10*3/uL (ref 4.0–10.5)
nRBC: 0 % (ref 0.0–0.2)

## 2022-12-06 LAB — TROPONIN I (HIGH SENSITIVITY)
Troponin I (High Sensitivity): 7 ng/L (ref <18)
Troponin I (High Sensitivity): 12 ng/L (ref <18)
Troponin I (High Sensitivity): 13 ng/L (ref <18)

## 2022-12-06 MED ORDER — OXYCODONE-ACETAMINOPHEN 5-325 MG PREPACK: ORAL_TABLET | ORAL | 0 refills | Status: DC

## 2022-12-06 MED ORDER — OXYCODONE-ACETAMINOPHEN 5-325 MG PO TABS: 1.0000 | ORAL_TABLET | Freq: Four times a day (QID) | ORAL | 0 refills | Status: DC | PRN

## 2022-12-06 MED ORDER — TAMSULOSIN HCL 0.4 MG PO CAPS: 0.4000 mg | ORAL_CAPSULE | Freq: Every day | ORAL | 0 refills | Status: DC

## 2022-12-06 MED ORDER — ONDANSETRON 4 MG PO TBDP: 4.0000 mg | ORAL_TABLET | Freq: Three times a day (TID) | ORAL | 0 refills | Status: DC | PRN

## 2022-12-06 MED ORDER — OXYCODONE-ACETAMINOPHEN 5-325 MG PREPACK: 6.0000 | ORAL_TABLET | Freq: Four times a day (QID) | ORAL | 0 refills | Status: DC | PRN

## 2022-12-06 MED ORDER — OXYCODONE-ACETAMINOPHEN 5-325 MG PO TABS: 1.0000 | ORAL_TABLET | ORAL | 0 refills | Status: DC | PRN

## 2022-12-06 MED ORDER — ONDANSETRON HCL 4 MG/2ML IJ SOLN
4.0000 mg | Freq: Once | INTRAMUSCULAR | Status: AC
  Administered 2022-12-06: 4 mg via INTRAVENOUS
  Filled 2022-12-06: qty 2

## 2022-12-06 MED ORDER — ONDANSETRON 4 MG PO TBDP: 4.0000 mg | ORAL_TABLET | Freq: Once | ORAL | Status: DC

## 2022-12-06 MED ORDER — SODIUM CHLORIDE 0.9 % IV BOLUS
500.0000 mL | Freq: Once | INTRAVENOUS | Status: AC
  Administered 2022-12-06: 500 mL via INTRAVENOUS

## 2022-12-06 MED ORDER — MORPHINE SULFATE (PF) 4 MG/ML IV SOLN
4.0000 mg | Freq: Once | INTRAVENOUS | Status: AC
  Administered 2022-12-06: 4 mg via INTRAVENOUS
  Filled 2022-12-06: qty 1

## 2022-12-06 MED ORDER — HYDROMORPHONE HCL 1 MG/ML IJ SOLN
1.0000 mg | Freq: Once | INTRAMUSCULAR | Status: AC
  Administered 2022-12-06: 1 mg via INTRAVENOUS
  Filled 2022-12-06: qty 1

## 2022-12-06 NOTE — ED Notes (Signed)
Patient transported to CT 

## 2022-12-06 NOTE — Discharge Instructions (Addendum)
You have a kidney stone is 3 mm in your right ureter.  Being given a prescription for pain medicine, follow-up closely with the urologist.  Come back to the ER if you have any new or worsening symptoms.  Since you are having some chest pain in the ER I recommend you follow-up with your heart doctor as well.  Please call tomorrow to schedule appointment.

## 2022-12-06 NOTE — ED Provider Notes (Signed)
Isaiah Jones   CSN: [id number] Arrival date & time: 12/06/22  1301     History  Chief Complaint  Patient presents with   Flank Pain    Isaiah Jones is a 72 y.o. male.  He has PMH of A-fib, on Xarelto, hypertension, arthritis, kidney stone, gout.  Presents to the ER complaining of right lower back pain.  Started having hematuria couple weeks ago intermittently, had some pain about 5 days ago, stopped about 2 days ago but started again this morning patient having some nausea and vomiting with this as well.  Feels like prior kidney stones.  He had a 5 to 10-minute episode of chest pain that started on the right side spread to the left anterior chest in the waiting room now resolved.  Was not diaphoretic.    Flank Pain       Home Medications Prior to Admission medications   Medication Sig Start Date End Date Taking? Authorizing Provider  oxyCODONE-acetaminophen (PERCOCET/ROXICET) 5-325 MG tablet Take 1 tablet by mouth every 6 (six) hours as needed for severe pain. 12/06/22  Yes ,  A, PA-C  acetaminophen (TYLENOL) 500 MG tablet Take 500-1,000 mg by mouth every 6 (six) hours as needed (for pain.).    [provider]  allopurinol (ZYLOPRIM) 300 MG tablet Take 1 tablet (300 mg total) by mouth daily. 10/11/22   Lula Olszewski, MD  amoxicillin (AMOXIL) 250 MG capsule Take 1,000 mg by mouth See admin instructions. Take 1000 mg 1 hour prior to dental work    [provider]  Azelastine HCl 137 MCG/SPRAY SOLN Place 2 sprays into both nostrils daily as needed (allergies). 06/14/21   [provider]  Calcium Carb-Cholecalciferol (CALCIUM-VITAMIN D) 600-400 MG-UNIT TABS Take 1 tablet by mouth 3 (three) times daily.    [provider]  colchicine 0.6 MG tablet Take 1 tablet (0.6 mg total) by mouth daily. 10/02/22   Lula Olszewski, MD  diclofenac Sodium (VOLTAREN) 1 % GEL Apply 4 g topically 4  (four) times daily as needed. 05/26/22   Lula Olszewski, MD  diphenhydramine-acetaminophen (TYLENOL PM) 25-500 MG TABS tablet Take 2 tablets by mouth at bedtime.     [provider]  Evolocumab (REPATHA) 140 MG/ML SOSY Inject 140 mg into the skin every 14 (fourteen) days. 09/15/22   Lula Olszewski, MD  fexofenadine (ALLEGRA) 180 MG tablet Take 180 mg by mouth daily as needed for allergies or rhinitis.    [provider]  Ginkgo 60 MG TABS Take 60 mg by mouth daily.    [provider]  latanoprost (XALATAN) 0.005 % ophthalmic solution Place 1 drop into both eyes at bedtime.  01/31/17   [provider]  methocarbamol (ROBAXIN) 750 MG tablet Take 1 tablet (750 mg total) by mouth every 6 (six) hours as needed for muscle spasms. 10/22/19   Elodia Florence, PA-C  metoprolol succinate (TOPROL XL) 25 MG 24 hr tablet Take 1 tablet (25 mg total) by mouth daily. 10/02/22   Corky Crafts, MD  mometasone (NASONEX) 50 MCG/ACT nasal spray Place 1 spray into the nose at bedtime.    [provider]  Multiple Vitamin (MULTIVITAMIN WITH MINERALS) TABS tablet Take 1 tablet by mouth 2 (two) times daily. Bariatric Multivitamin    [provider]  Multiple Vitamins-Minerals (AIRBORNE PO) Take 1 tablet by mouth daily as needed (immune health).     [provider]  Omega-3 Fatty Acids (FISH OIL PO) Take by mouth.    [provider]  predniSONE (DELTASONE) 20 MG tablet Take 2 pills for 3 days, 1 pill for 4 days 10/30/22   Lula Olszewski, MD  rivaroxaban (XARELTO) 20 MG TABS tablet Take 1 tablet (20 mg total) by mouth daily with supper. 11/13/22   Corky Crafts, MD  rosuvastatin (CRESTOR) 40 MG tablet Take 1 tablet (40 mg total) by mouth daily. 10/11/22   Lula Olszewski, MD  sacubitril-valsartan (ENTRESTO) 49-51 MG Take 1 tablet by mouth 2 (two) times daily. 05/19/22   Swinyer, Zachary George, NP  Semaglutide-Weight Management (WEGOVY) 1.7 MG/0.75ML  SOAJ Inject 1.7 mg into the skin once a week. 10/20/22   Lula Olszewski, MD  sertraline (ZOLOFT) 50 MG tablet Take 1 tablet (50 mg total) by mouth daily. 11/22/22   Lula Olszewski, MD  spironolactone (ALDACTONE) 25 MG tablet Take 0.5 tablets (12.5 mg total) by mouth daily. 05/19/22 05/20/23  Swinyer, Zachary George, NP  thiamine (VITAMIN B-1) 100 MG tablet Take 100 mg by mouth daily. 09/04/22   [provider]      Allergies    Erythromycin    Review of Systems   Review of Systems  Genitourinary:  Positive for flank pain.    Physical Exam Updated Vital Signs BP 121/72   Pulse 76   Temp 98 F (36.7 C) (Oral)   Resp 19   Ht 6\' 2"  (1.88 m)   Wt 125.2 kg   SpO2 95%   BMI 35.44 kg/m  Physical Exam Vitals and nursing Jones reviewed.  Constitutional:      General: He is not in acute distress.    Appearance: He is well-developed.  HENT:     Head: Normocephalic and atraumatic.  Eyes:     Conjunctiva/sclera: Conjunctivae normal.  Cardiovascular:     Rate and Rhythm: Normal rate and regular rhythm.     Heart sounds: No murmur heard. Pulmonary:     Effort: Pulmonary effort is normal. No respiratory distress.     Breath sounds: Normal breath sounds.  Abdominal:     Palpations: Abdomen is soft.     Tenderness: There is no abdominal tenderness.  Musculoskeletal:        General: No swelling.     Cervical back: Neck supple.  Skin:    General: Skin is warm and dry.     Capillary Refill: Capillary refill takes less than 2 seconds.  Neurological:     Mental Status: He is alert.  Psychiatric:        Mood and Affect: Mood normal.     ED Results / Procedures / Treatments   Labs (all labs ordered are listed, but only abnormal results are displayed) Labs Reviewed  URINALYSIS, ROUTINE W REFLEX MICROSCOPIC - Abnormal; Notable for the following components:      Result Value   Color, Urine AMBER (*)    APPearance TURBID (*)    Hgb urine dipstick LARGE (*)    Bilirubin Urine  MODERATE (*)    Ketones, ur 40 (*)    Protein, ur 100 (*)    All other components within normal limits  BASIC METABOLIC PANEL - Abnormal; Notable for the following components:   Glucose, Bld 131 (*)    All other components within normal limits  URINALYSIS, MICROSCOPIC (REFLEX) - Abnormal; Notable for the following components:   Bacteria, UA MANY (*)    All other components within normal limits  URINE CULTURE  CBC  TROPONIN I (HIGH SENSITIVITY)  TROPONIN I (HIGH SENSITIVITY)  TROPONIN I (HIGH SENSITIVITY)    EKG EKG Interpretation Date/Time:  Wednesday December 06 2022 15:15:22 EDT Ventricular Rate:  51 PR Interval:    QRS Duration:  144 QT Interval:  472 QTC Calculation: 435 R Axis:   169  Text Interpretation: Atrial fibrillation with slow ventricular response Right axis deviation Left bundle branch block Abnormal ECG When compared with ECG of 06-Dec-2022 13:51, T wave inversion no longer evident in Inferior leads Confirmed by Vanetta Mulders [phone]) on 12/06/2022 6:23:18 PM  Radiology CT ABDOMEN PELVIS WO CONTRAST  Result Date: 12/06/2022 CLINICAL DATA:  Right flank pain, vomiting, hematuria EXAM: CT ABDOMEN AND PELVIS WITHOUT CONTRAST TECHNIQUE: Multidetector CT imaging of the abdomen and pelvis was performed following the standard protocol without IV contrast. RADIATION DOSE REDUCTION: This exam was performed according to the departmental dose-optimization program which includes automated exposure control, adjustment of the mA and/or kV according to patient size and/or use of iterative reconstruction technique. COMPARISON:  01/21/2021 FINDINGS: Lower chest: Coronary artery disease and aortic atherosclerosis. No acute abnormality. Hepatobiliary: No focal hepatic abnormality. Gallbladder unremarkable. Pancreas: No focal abnormality or ductal dilatation. Spleen: No focal abnormality.  Normal size. Adrenals/Urinary Tract: Normal adrenal glands. Numerous bilateral renal cysts are stable  since prior study and likely reflect benign cysts. No follow-up imaging recommended. Mild right hydronephrosis and perinephric stranding due to 3 mm mid right ureteral stone. Urinary bladder unremarkable. Stomach/Bowel: Colonic diverticulosis. No active diverticulitis. Normal appendix. Prior gastric sleeve. No bowel obstruction. Vascular/Lymphatic: No evidence of aneurysm or adenopathy. Aortic atherosclerosis. Reproductive: No visible focal abnormality. Other: No free fluid or free air. Musculoskeletal: Prior right hip replacement. No acute bony abnormality. IMPRESSION: 3 mm mid right ureteral stone with mild right hydronephrosis. Colonic diverticulosis.  No active diverticulitis. Aortic atherosclerosis. Electronically Signed   By: Charlett Nose M.D.   On: 12/06/2022 17:21    Procedures Procedures    Medications Ordered in ED Medications  morphine (PF) 4 MG/ML injection 4 mg (4 mg Intravenous Given 12/06/22 1712)  ondansetron (ZOFRAN) injection 4 mg (4 mg Intravenous Given 12/06/22 1713)  sodium chloride 0.9 % bolus 500 mL (500 mLs Intravenous New Bag/Given 12/06/22 1745)    ED Course/ Medical Decision Making/ A&P                                 Medical Decision Making This patient presents to the ED for concern of right flank pain and hematuria, this involves an extensive number of treatment options, and is a complaint that carries with it a high risk of complications and morbidity.  The differential diagnosis includes kidney stone, UTI, bladder mass, sepsis, other   Co morbidities that complicate the patient evaluation :   History of kidney stones, A-fib on blood thinners   Additional history obtained:  Additional history obtained from MR External records from outside source obtained and reviewed including notes   Lab Tests:  I Ordered, and personally interpreted labs.  The pertinent results include: CBC is normal, CMP is normal, UA shows large blood, 40 ketones no nitrite, no leukocytes  but does have many bacteria and greater than 50 red blood cells per high-power field.  No white blood cells.  Urine sent for culture.  Patient not having dysuria feel this is likely all due to his hematuria from his kidney stone   Imaging  Studies ordered:  I ordered imaging studies including CT abdomen pelvis I independently visualized and interpreted imaging which showed right mid ureteral stone with mild hydronephrosis I agree with the radiologist interpretation   Cardiac Monitoring: / EKG:  The patient was maintained on a cardiac monitor.  I personally viewed and interpreted the cardiac monitored which showed an underlying rhythm of: Atrial fibrillation with controlled ventricular rate     Problem List / ED Course / Critical interventions / Medication management  Flank pain-patient has 3 mm stone that is mildly obstructing.  Will need to follow-up with urology.  Pain is resolved after 1 dose of morphine.  Unfortunately patient had developed some chest pain in the waiting room, triage nurse did add tropes but for stroke was run on blood before he had chest pain so third troponin ordered to truly assess.  He is pain-free now only had 10 minutes of pain EKG shows no ischemic changes.  Signed out to PA [facility] pending his last troponin.  This is negative I think he can go home to follow-up with his cardiologist as an outpatient and follow-up with urology for his kidney stone.  The chest pain he has I ordered medication including morphine for pain  Reevaluation of the patient after these medicines showed that the patient resolved I have reviewed the patients home medicines and have made adjustments as needed       Amount and/or Complexity of Data Reviewed Labs: ordered. Radiology: ordered.  Risk Prescription drug management.           Final Clinical Impression(s) / ED Diagnoses Final diagnoses:  Ureterolithiasis    Rx / DC Orders ED Discharge Orders           Ordered    oxyCODONE-acetaminophen (PERCOCET/ROXICET) 5-325 MG tablet  Every 6 hours PRN        12/06/22 1909              Ma Rings, PA-C 12/06/22 1911    Vanetta Mulders, MD 12/08/22 [phone]

## 2022-12-06 NOTE — ED Provider Notes (Signed)
Care assumed by me at 7 PM pending second troponin.  Patient presented to the emergency department with flank pain and has a 3 mm mid right ureteral stone.  Patient had an episode of chest pain while waiting to be seen.  Patient pending second troponin first troponin was negative.  Second troponin is negative.  Had reoccurrence of pain he is given Dilaudid and Zofran IV.  Patient is counseled on results patient is given a prescription for Percocet Zofran and Maxide.  Patient is advised to follow-up with [facility] for recheck.  Patient is counseled on kidney stone.   Isaiah Jones 12/06/22 2029    Vanetta Mulders, MD 12/08/22 [phone]

## 2022-12-06 NOTE — ED Notes (Signed)
Pt returned from CT °

## 2022-12-06 NOTE — ED Triage Notes (Signed)
Pt presents with right flank, pain with vomiting and hematuria.

## 2022-12-07 MED FILL — Oxycodone w/ Acetaminophen Tab 5-325 MG: ORAL | Qty: 6 | Status: AC

## 2022-12-22 ENCOUNTER — Ambulatory Visit: Payer: Medicare Other | Admitting: Internal Medicine

## 2022-12-22 ENCOUNTER — Other Ambulatory Visit: Payer: Self-pay | Admitting: *Deleted

## 2022-12-22 ENCOUNTER — Ambulatory Visit: Payer: Medicare Other

## 2022-12-22 ENCOUNTER — Telehealth: Payer: Self-pay | Admitting: *Deleted

## 2022-12-22 ENCOUNTER — Encounter: Payer: Self-pay | Admitting: Internal Medicine

## 2022-12-22 ENCOUNTER — Telehealth: Payer: Self-pay | Admitting: Interventional Cardiology

## 2022-12-22 VITALS — BP 98/65 | HR 56 | Temp 97.8°F | Ht 74.0 in | Wt 278.0 lb

## 2022-12-22 DIAGNOSIS — I5022 Chronic systolic (congestive) heart failure: Secondary | ICD-10-CM

## 2022-12-22 DIAGNOSIS — R0789 Other chest pain: Secondary | ICD-10-CM | POA: Diagnosis not present

## 2022-12-22 DIAGNOSIS — N200 Calculus of kidney: Secondary | ICD-10-CM | POA: Diagnosis not present

## 2022-12-22 DIAGNOSIS — F325 Major depressive disorder, single episode, in full remission: Secondary | ICD-10-CM

## 2022-12-22 DIAGNOSIS — R82998 Other abnormal findings in urine: Secondary | ICD-10-CM

## 2022-12-22 DIAGNOSIS — R002 Palpitations: Secondary | ICD-10-CM

## 2022-12-22 DIAGNOSIS — I429 Cardiomyopathy, unspecified: Secondary | ICD-10-CM

## 2022-12-22 DIAGNOSIS — I447 Left bundle-branch block, unspecified: Secondary | ICD-10-CM

## 2022-12-22 DIAGNOSIS — R931 Abnormal findings on diagnostic imaging of heart and coronary circulation: Secondary | ICD-10-CM

## 2022-12-22 LAB — COMPREHENSIVE METABOLIC PANEL
ALT: 13 U/L (ref 0–53)
AST: 17 U/L (ref 0–37)
Albumin: 3.6 g/dL (ref 3.5–5.2)
Alkaline Phosphatase: 59 U/L (ref 39–117)
BUN: 13 mg/dL (ref 6–23)
CO2: 29 mEq/L (ref 19–32)
Calcium: 9.4 mg/dL (ref 8.4–10.5)
Chloride: 103 mEq/L (ref 96–112)
Creatinine, Ser: 0.95 mg/dL (ref 0.40–1.50)
GFR: 79.53 mL/min (ref >60.00)
Glucose, Bld: 79 mg/dL (ref 70–99)
Potassium: 3.8 mEq/L (ref 3.5–5.1)
Sodium: 140 mEq/L (ref 135–145)
Total Bilirubin: 0.6 mg/dL (ref 0.2–1.2)
Total Protein: 6 g/dL (ref 6.0–8.3)

## 2022-12-22 LAB — LIPID PANEL
Cholesterol: 76 mg/dL (ref 0–200)
HDL: 38.9 mg/dL — ABNORMAL LOW (ref >39.00)
LDL Cholesterol: 18 mg/dL (ref 0–99)
NonHDL: 37.28
Total CHOL/HDL Ratio: 2
Triglycerides: 97 mg/dL (ref 0.0–149.0)
VLDL: 19.4 mg/dL (ref 0.0–40.0)

## 2022-12-22 LAB — URINALYSIS, ROUTINE W REFLEX MICROSCOPIC
Ketones, ur: NEGATIVE
Leukocytes,Ua: NEGATIVE
Nitrite: NEGATIVE
Specific Gravity, Urine: 1.02 (ref 1.000–1.030)
Total Protein, Urine: NEGATIVE
Urine Glucose: NEGATIVE
Urobilinogen, UA: 0.2 (ref 0.0–1.0)
pH: 6 (ref 5.0–8.0)

## 2022-12-22 LAB — HEMOGLOBIN A1C: Hgb A1c MFr Bld: 5.5 % (ref 4.6–6.5)

## 2022-12-22 LAB — VITAMIN D 25 HYDROXY (VIT D DEFICIENCY, FRACTURES): VITD: 32.35 ng/mL (ref 30.00–100.00)

## 2022-12-22 LAB — URIC ACID: Uric Acid, Serum: 5 mg/dL (ref 4.0–7.8)

## 2022-12-22 NOTE — Telephone Encounter (Signed)
See other phone note from today.

## 2022-12-22 NOTE — Progress Notes (Signed)
[facility]: [phone]   -- Routine Medical Office Visit --  Patient:  Isaiah Jones      Age: 72 y.o.       Sex:  male  Date:   12/22/2022 Patient Care Team: Lula Olszewski, MD as PCP - General (Internal Medicine) Corky Crafts, MD as PCP - Cardiology (Cardiology) Corky Crafts, MD as Consulting Physician (Cardiology) Crista Elliot, MD as Consulting Physician (Urology) Diamantina Monks, MD as Referring Physician (Gastroenterology) Ronnette Juniper, MD as Referring Physician (Pulmonary Disease) Marcene Corning, MD as Consulting Physician (Orthopedic Surgery) Santo Held as Consulting Physician (Dentistry) Claria Dice, MD as Attending Physician (Physical Medicine and Rehabilitation) Today's Healthcare Provider: Lula Olszewski, MD      Assessment & Plan Chest discomfort He experienced a recent episode of severe chest pain and apparently appeared as though he was dying per patient and spouse, raising concerns for a significant arrhythmia, though no troponin elevation was noted during his ER visit. With a history of AFib and low ejection fraction, urgent cardiology evaluation by Dr. Eldridge Dace is necessary. We will consider heart monitoring and an echocardiogram. Lithotripsy will not proceed without cardiac clearance. Depression, major, in remission [facility]) He exhibits mild symptoms of depression and is currently on Zoloft, which will be continued. Mainly this is health driven. Kidney stone Kidney Stone He presents with right-sided pain and hematuria for 4 weeks, with a CT confirming the presence of a stone. He is currently managing pain effectively on Flomax and lithotripsy is considered pending the stone's natural passage. We will collect urine for stone analysis and check kidney function and uric acid levels. Lithotripsy is postponed until cardiac clearance is obtained.  Gout He is on Allopurinol with no recent flares reported. Uric acid level  will be checked. Due to stone although last check was good Chronic HFrEF (heart failure with reduced ejection fraction) (HCC)  Dark urine     Today's encounter orders         Ordered    Urinalysis, Routine w reflex microscopic        12/22/22 0911    Comp Met (CMET)        12/22/22 0911    Uric acid        12/22/22 0911    Lipid panel        12/22/22 0911    HgB A1c        12/22/22 0911    Vitamin D (25 hydroxy)        12/22/22 0911    Urine Culture        12/22/22 1344          Recommended follow up: 1-3 months follow up  Future Appointments  Date Time Provider Department Center  12/25/2022  8:30 AM [facility]  12/26/2022  1:15 PM Dyann Kief, PA-C [facility]  03/27/2023  8:40 AM Lula Olszewski, MD [facility]  04/03/2023  8:40 AM Corky Crafts, MD [facility]            Subjective   72 y.o. male who has Persistent atrial fibrillation (HCC); OSA on CPAP; Dyslipidemia; Erectile dysfunction; Essential hypertension; Gout; Osteoarthritis; Seasonal allergies; Depression, major, in remission (HCC) with anxiety; Vitamin D deficiency; S/P laparoscopic sleeve gastrectomy; Primary localized osteoarthritis of right hip; Former smoker; Chronic cough; Dysphagia; Hyperlipidemia; Hypertriglyceridemia; LBBB (left bundle branch block); Hypertensive heart disease with heart failure (HCC); Anticoagulant long-term use; Achalasia of esophagus; Chronic  HFrEF (heart failure with reduced ejection fraction) (HCC); Presbycusis; Laryngopharyngeal reflux (LPR); Insomnia with sleep apnea; History of arthritis; Foot pain, right; Blood in urine; Allergic rhinitis; Morbid obesity (HCC); Hypertension; At risk for falls; CAD (coronary artery disease); and Kidney stone on their problem list. His reasons/main concerns/chief complaints for today's office visit are 6 week follow-up    ------------------------------------------------------------------------------------------------------------------------ AI-Extracted: Discussed the use of AI scribe software for clinical note transcription with the patient, who gave verbal consent to proceed.  History of Present Illness   The patient, with a history of gout and atrial fibrillation, presented with a four-week history of hematuria and severe right-sided pain, which led to an ER visit two weeks prior. A CT scan confirmed the presence of a kidney stone. The patient reported fluctuating urine color and was scheduled for lithotripsy if the stone did not pass naturally. He was prescribed pain medication and Flomax, which he reported as effective, though he did not notice a significant increase in urine output. The patient also mentioned a recent episode of severe chest pain that occurred while in the ER for the kidney stone, which was suspected to be a severe arrhythmia. His wife reported that he appeared as if he was about to die during this episode. The patient was advised to consult with his cardiologist regarding this incident. He also reported a history of depression, which was currently under control with Zoloft. He was also using a CPAP machine for sleep apnea.       ------------------------------------------------------------------------------------------------------------------------ He has a past medical history of Anxiety, Arthritis, degenerative, Benign hypertensive heart disease without congestive heart failure, BMI 45.0-49.9, adult (HCC) (03/09/2018), CHF (congestive heart failure) (HCC), Colon polyps, Complication of anesthesia, Depression, Dyslipidemia, Dysrhythmia (2017), GERD (gastroesophageal reflux disease), Glaucoma, History of 2019 novel coronavirus disease (COVID-19) (06/2019), History of kidney stones, Hypertension, Hypertriglyceridemia (03/14/2022), OSA on CPAP, Persistent atrial fibrillation (HCC), Personal history of  gout (05/23/2019), Pneumonia, PONV (postoperative nausea and vomiting), and Rhinitis, allergic.  Problem list overviews that were updated at today's visit: Problem  Kidney Stone   Painless bleeding at first 10/2022 Then persistent severe R flank pain CT showed it Following with urology who is considering lithotripsy next week if it hasn't passed by then. Has prescription opioids/Flomax and drinks lots of fluid and has filter.   Depression, major, in remission (HCC) with anxiety      12/22/2022    8:37 AM 10/02/2022    4:57 PM 06/16/2022    8:06 AM  Depression screen PHQ 2/9  Decreased Interest 0 0 0  Down, Depressed, Hopeless 0 0 0  PHQ - 2 Score 0 0 0  Altered sleeping 0  0  Tired, decreased energy 1  0  Change in appetite 0  0  Feeling bad or failure about yourself  0  0  Trouble concentrating 0  0  Moving slowly or fidgety/restless 0  0  Suicidal thoughts 0  0  PHQ-9 Score 1  0  Difficult doing work/chores Somewhat difficult  Not difficult at all  he takes zoloft- feels like he doesn't need.     Current Outpatient Medications on File Prior to Visit  Medication Sig   acetaminophen (TYLENOL) 500 MG tablet Take 500-1,000 mg by mouth every 6 (six) hours as needed (for pain.).   allopurinol (ZYLOPRIM) 300 MG tablet Take 1 tablet (300 mg total) by mouth daily.   amoxicillin (AMOXIL) 250 MG capsule Take 1,000 mg by mouth See admin instructions. Take 1000 mg 1 hour  prior to dental work   Azelastine HCl 137 MCG/SPRAY SOLN Place 2 sprays into both nostrils daily as needed (allergies).   Calcium Carb-Cholecalciferol (CALCIUM-VITAMIN D) 600-400 MG-UNIT TABS Take 1 tablet by mouth 3 (three) times daily.   colchicine 0.6 MG tablet Take 1 tablet (0.6 mg total) by mouth daily.   diclofenac Sodium (VOLTAREN) 1 % GEL Apply 4 g topically 4 (four) times daily as needed.   diphenhydramine-acetaminophen (TYLENOL PM) 25-500 MG TABS tablet Take 2 tablets by mouth at bedtime.    Evolocumab (REPATHA)  140 MG/ML SOSY Inject 140 mg into the skin every 14 (fourteen) days.   fexofenadine (ALLEGRA) 180 MG tablet Take 180 mg by mouth daily as needed for allergies or rhinitis.   Ginkgo 60 MG TABS Take 60 mg by mouth daily.   latanoprost (XALATAN) 0.005 % ophthalmic solution Place 1 drop into both eyes at bedtime.    methocarbamol (ROBAXIN) 750 MG tablet Take 1 tablet (750 mg total) by mouth every 6 (six) hours as needed for muscle spasms.   metoprolol succinate (TOPROL XL) 25 MG 24 hr tablet Take 1 tablet (25 mg total) by mouth daily.   mometasone (NASONEX) 50 MCG/ACT nasal spray Place 1 spray into the nose at bedtime.   morphine (MSIR) 15 MG tablet Take 15 mg by mouth every 6 (six) hours.   Multiple Vitamin (MULTIVITAMIN WITH MINERALS) TABS tablet Take 1 tablet by mouth 2 (two) times daily. Bariatric Multivitamin   Multiple Vitamins-Minerals (AIRBORNE PO) Take 1 tablet by mouth daily as needed (immune health).    Omega-3 Fatty Acids (FISH OIL PO) Take by mouth.   ondansetron (ZOFRAN-ODT) 4 MG disintegrating tablet Take 1 tablet (4 mg total) by mouth every 8 (eight) hours as needed for nausea or vomiting.   oxyCODONE-acetaminophen (PERCOCET) 5-325 MG tablet Take 1 tablet by mouth every 4 (four) hours as needed for severe pain.   rivaroxaban (XARELTO) 20 MG TABS tablet Take 1 tablet (20 mg total) by mouth daily with supper.   rosuvastatin (CRESTOR) 40 MG tablet Take 1 tablet (40 mg total) by mouth daily.   sacubitril-valsartan (ENTRESTO) 49-51 MG Take 1 tablet by mouth 2 (two) times daily.   Semaglutide-Weight Management (WEGOVY) 1.7 MG/0.75ML SOAJ Inject 1.7 mg into the skin once a week.   sertraline (ZOLOFT) 50 MG tablet Take 1 tablet (50 mg total) by mouth daily.   spironolactone (ALDACTONE) 25 MG tablet Take 0.5 tablets (12.5 mg total) by mouth daily.   tamsulosin (FLOMAX) 0.4 MG CAPS capsule Take 1 capsule (0.4 mg total) by mouth daily.   thiamine (VITAMIN B-1) 100 MG tablet Take 100 mg by mouth  daily.   oxyCODONE-acetaminophen (PERCOCET) 5-325 mg TABS tablet One tablet every 6 hours (Patient not taking: Reported on 12/22/2022)   oxyCODONE-acetaminophen (PERCOCET/ROXICET) 5-325 MG tablet Take 1 tablet by mouth every 6 (six) hours as needed for severe pain. (Patient not taking: Reported on 12/22/2022)   No current facility-administered medications on file prior to visit.   Medications Discontinued During This Encounter  Medication Reason   predniSONE (DELTASONE) 20 MG tablet         Objective    Physical Exam  BP 98/65 (BP Location: Right Arm, Patient Position: Sitting)   Pulse (!) 56   Temp 97.8 F (36.6 C) (Temporal)   Ht 6\' 2"  (1.88 m)   Wt 278 lb (126.1 kg)   SpO2 98%   BMI 35.69 kg/m  Wt Readings from Last 10 Encounters:  12/22/22  278 lb (126.1 kg)  12/06/22 276 lb (125.2 kg)  10/30/22 288 lb 9.6 oz (130.9 kg)  10/11/22 286 lb 12.8 oz (130.1 kg)  10/02/22 293 lb 3.2 oz (133 kg)  10/02/22 289 lb (131.1 kg)  09/22/22 289 lb (131.1 kg)  09/18/22 294 lb (133.4 kg)  07/25/22 (!) 301 lb (136.5 kg)  06/16/22 (!) 312 lb 3.2 oz (141.6 kg)   Vital signs reviewed.  Nursing notes reviewed. Weight trend reviewed. Abnormalities and Problem-Specific physical exam findings:  weight loss noted  General Appearance:  No acute distress appreciable.   Well-groomed, healthy-appearing male.  Well proportioned with no abnormal fat distribution.  Good muscle tone. Pulmonary:  Normal work of breathing at rest, no respiratory distress apparent. SpO2: 98 %  Musculoskeletal: All extremities are intact.  Neurological:  Awake, alert, oriented, and engaged.  No obvious focal neurological deficits or cognitive impairments.  Sensorium seems unclouded.   Speech is clear and coherent with logical content. Psychiatric:  Appropriate mood, pleasant and cooperative demeanor, thoughtful and engaged during the exam  Results   LABS Troponins: Negative  RADIOLOGY CT scan: Kidney stone confirmed  (12/06/2022)  DIAGNOSTIC REPORTS EKG: Negative for myocardial infarction        Results for orders placed or performed in visit on 12/22/22  Urinalysis, Routine w reflex microscopic  Result Value Ref Range   Color, Urine YELLOW Yellow;Lt. Yellow;Straw;Dark Yellow;Amber;Green;Red;Brown   APPearance CLEAR Clear;Turbid;Slightly Cloudy;Cloudy   Specific Gravity, Urine 1.020 1.000 - 1.030   pH 6.0 5.0 - 8.0   Total Protein, Urine NEGATIVE Negative   Urine Glucose NEGATIVE Negative   Ketones, ur NEGATIVE Negative   Bilirubin Urine SMALL (A) Negative   Hgb urine dipstick LARGE (A) Negative   Urobilinogen, UA 0.2 0.0 - 1.0   Leukocytes,Ua NEGATIVE Negative   Nitrite NEGATIVE Negative   WBC, UA 0-2/hpf 0-2/hpf   RBC / HPF TNTC(>50/hpf) (A) 0-2/hpf   Squamous Epithelial / HPF Rare(0-4/hpf) Rare(0-4/hpf)  Comp Met (CMET)  Result Value Ref Range   Sodium 140 135 - 145 mEq/L   Potassium 3.8 3.5 - 5.1 mEq/L   Chloride 103 96 - 112 mEq/L   CO2 29 19 - 32 mEq/L   Glucose, Bld 79 70 - 99 mg/dL   BUN 13 6 - 23 mg/dL   Creatinine, Ser 0.98 0.40 - 1.50 mg/dL   Total Bilirubin 0.6 0.2 - 1.2 mg/dL   Alkaline Phosphatase 59 39 - 117 U/L   AST 17 0 - 37 U/L   ALT 13 0 - 53 U/L   Total Protein 6.0 6.0 - 8.3 g/dL   Albumin 3.6 3.5 - 5.2 g/dL   GFR 11.91 >47.82 mL/min   Calcium 9.4 8.4 - 10.5 mg/dL  Uric acid  Result Value Ref Range   Uric Acid, Serum 5.0 4.0 - 7.8 mg/dL  Lipid panel  Result Value Ref Range   Cholesterol 76 0 - 200 mg/dL   Triglycerides 95.6 0.0 - 149.0 mg/dL   HDL 21.30 (L) >86.57 mg/dL   VLDL 84.6 0.0 - 96.2 mg/dL   LDL Cholesterol 18 0 - 99 mg/dL   Total CHOL/HDL Ratio 2    NonHDL 37.28   HgB A1c  Result Value Ref Range   Hgb A1c MFr Bld 5.5 4.6 - 6.5 %  Vitamin D (25 hydroxy)  Result Value Ref Range   VITD 32.35 30.00 - 100.00 ng/mL    Office Visit on 12/22/2022  Component Date Value   Color, Urine 12/22/2022  YELLOW    APPearance 12/22/2022 CLEAR     Specific Gravity, Urine 12/22/2022 1.020    pH 12/22/2022 6.0    Total Protein, Urine 12/22/2022 NEGATIVE    Urine Glucose 12/22/2022 NEGATIVE    Ketones, ur 12/22/2022 NEGATIVE    Bilirubin Urine 12/22/2022 SMALL (A)    Hgb urine dipstick 12/22/2022 LARGE (A)    Urobilinogen, UA 12/22/2022 0.2    Leukocytes,Ua 12/22/2022 NEGATIVE    Nitrite 12/22/2022 NEGATIVE    WBC, UA 12/22/2022 0-2/hpf    RBC / HPF 12/22/2022 TNTC(>50/hpf) (A)    Squamous Epithelial / HPF 12/22/2022 Rare(0-4/hpf)    Sodium 12/22/2022 140    Potassium 12/22/2022 3.8    Chloride 12/22/2022 103    CO2 12/22/2022 29    Glucose, Bld 12/22/2022 79    BUN 12/22/2022 13    Creatinine, Ser 12/22/2022 0.95    Total Bilirubin 12/22/2022 0.6    Alkaline Phosphatase 12/22/2022 59    AST 12/22/2022 17    ALT 12/22/2022 13    Total Protein 12/22/2022 6.0    Albumin 12/22/2022 3.6    GFR 12/22/2022 79.53    Calcium 12/22/2022 9.4    Uric Acid, Serum 12/22/2022 5.0    Cholesterol 12/22/2022 76    Triglycerides 12/22/2022 97.0    HDL 12/22/2022 38.90 (L)    VLDL 12/22/2022 19.4    LDL Cholesterol 12/22/2022 18    Total CHOL/HDL Ratio 12/22/2022 2    NonHDL 12/22/2022 37.28    Hgb A1c MFr Bld 12/22/2022 5.5    VITD 12/22/2022 32.35   Admission on 12/06/2022, Discharged on 12/06/2022  Component Date Value   Color, Urine 12/06/2022 AMBER (A)    APPearance 12/06/2022 TURBID (A)    Specific Gravity, Urine 12/06/2022 1.025    pH 12/06/2022 7.0    Glucose, UA 12/06/2022 NEGATIVE    Hgb urine dipstick 12/06/2022 LARGE (A)    Bilirubin Urine 12/06/2022 MODERATE (A)    Ketones, ur 12/06/2022 40 (A)    Protein, ur 12/06/2022 100 (A)    Nitrite 12/06/2022 NEGATIVE    Leukocytes,Ua 12/06/2022 NEGATIVE    Sodium 12/06/2022 137    Potassium 12/06/2022 4.3    Chloride 12/06/2022 101    CO2 12/06/2022 23    Glucose, Bld 12/06/2022 131 (H)    BUN 12/06/2022 13    Creatinine, Ser 12/06/2022 1.23    Calcium 12/06/2022 9.3     GFR, Estimated 12/06/2022 >60    Anion gap 12/06/2022 13    WBC 12/06/2022 9.0    RBC 12/06/2022 5.03    Hemoglobin 12/06/2022 16.8    HCT 12/06/2022 49.7    MCV 12/06/2022 98.8    MCH 12/06/2022 33.4    MCHC 12/06/2022 33.8    RDW 12/06/2022 15.0    Platelets 12/06/2022 212    nRBC 12/06/2022 0.0    RBC / HPF 12/06/2022 >50    WBC, UA 12/06/2022 0-5    Bacteria, UA 12/06/2022 MANY (A)    Squamous Epithelial / HPF 12/06/2022 0-5    Troponin I (High Sensiti* 12/06/2022 7    Troponin I (High Sensiti* 12/06/2022 12    Specimen Description 12/06/2022                     Value:URINE, CLEAN CATCH Performed at [facility], [address] [place], [place] [address]    Special Requests 12/06/2022  Value:NONE Performed at [facility], [address], [place], [place] [address]    Culture 12/06/2022                     Value:NO GROWTH Performed at [facility], [address] [place], [place] [address]    Report Status 12/06/2022 12/07/2022 FINAL    Troponin I (High Sensiti* 12/06/2022 13   Office Visit on 10/30/2022  Component Date Value   Sodium 10/30/2022 139    Potassium 10/30/2022 5.1    Chloride 10/30/2022 104    CO2 10/30/2022 29    Glucose, Bld 10/30/2022 100 (H)    BUN 10/30/2022 13    Creatinine, Ser 10/30/2022 0.88    GFR 10/30/2022 85.52    Calcium 10/30/2022 9.7    Uric Acid, Serum 10/30/2022 4.1   Admission on 09/22/2022, Discharged on 09/22/2022  Component Date Value   pH, Ven 09/22/2022 7.370    pCO2, Ven 09/22/2022 45.8    pO2, Ven 09/22/2022 39    Bicarbonate 09/22/2022 26.4    TCO2 09/22/2022 28    O2 Saturation 09/22/2022 71    Acid-Base Excess 09/22/2022 1.0    Sodium 09/22/2022 140    Potassium 09/22/2022 3.7    Calcium, Ion 09/22/2022 1.34    HCT 09/22/2022 45.0    Hemoglobin 09/22/2022 15.3    Sample type 09/22/2022 VENOUS    Comment 09/22/2022 NOTIFIED PHYSICIAN    Activated Clotting Time 09/22/2022 239    pH,  Arterial 09/22/2022 7.370    pCO2 arterial 09/22/2022 43.7    pO2, Arterial 09/22/2022 63 (L)    Bicarbonate 09/22/2022 25.3    TCO2 09/22/2022 27    O2 Saturation 09/22/2022 91    Acid-Base Excess 09/22/2022 0.0    Sodium 09/22/2022 140    Potassium 09/22/2022 3.7    Calcium, Ion 09/22/2022 1.31    HCT 09/22/2022 46.0    Hemoglobin 09/22/2022 15.6    Sample type 09/22/2022 ARTERIAL    pH, Ven 09/22/2022 7.369    pCO2, Ven 09/22/2022 44.9    pO2, Ven 09/22/2022 40    Bicarbonate 09/22/2022 25.9    TCO2 09/22/2022 27    O2 Saturation 09/22/2022 72    Acid-Base Excess 09/22/2022 0.0    Sodium 09/22/2022 140    Potassium 09/22/2022 3.7    Calcium, Ion 09/22/2022 1.35    HCT 09/22/2022 45.0    Hemoglobin 09/22/2022 15.3    Sample type 09/22/2022 VENOUS   Office Visit on 09/18/2022  Component Date Value   Glucose 09/18/2022 80    BUN 09/18/2022 13    Creatinine, Ser 09/18/2022 0.99    eGFR 09/18/2022 80    BUN/Creatinine Ratio 09/18/2022 13    Sodium 09/18/2022 141    Potassium 09/18/2022 4.3    Chloride 09/18/2022 103    CO2 09/18/2022 23    Calcium 09/18/2022 9.6    WBC 09/18/2022 6.0    RBC 09/18/2022 4.93    Hemoglobin 09/18/2022 16.2    Hematocrit 09/18/2022 48.6    MCV 09/18/2022 99 (H)    MCH 09/18/2022 32.9    MCHC 09/18/2022 33.3    RDW 09/18/2022 12.9    Platelets 09/18/2022 179   Ancillary Procedure on 08/23/2022  Component Date Value   Area-P 1/2 08/23/2022 3.87    S' Lateral 08/23/2022 4.90    Est EF 08/23/2022 25 - 30%   Orders Only on 06/02/2022  Component Date Value   Glucose 06/02/2022 90  BUN 06/02/2022 17    Creatinine, Ser 06/02/2022 0.92    eGFR 06/02/2022 88    BUN/Creatinine Ratio 06/02/2022 18    Sodium 06/02/2022 141    Potassium 06/02/2022 4.4    Chloride 06/02/2022 102    CO2 06/02/2022 23    Calcium 06/02/2022 9.8    specimen status report 06/02/2022 Comment   Ancillary Procedure on 04/17/2022  Component Date Value   S'  Lateral 04/17/2022 4.10   Lab on 03/13/2022  Component Date Value   PSA 03/13/2022 0.13    VITD 03/13/2022 37.40    TSH 03/13/2022 1.36    Methylmalonic Acid, Quant 03/13/2022 168    Cholesterol 03/13/2022 148    Triglycerides 03/13/2022 154.0 (H)    HDL 03/13/2022 48.70    VLDL 03/13/2022 30.8    LDL Cholesterol 03/13/2022 68    Total CHOL/HDL Ratio 03/13/2022 3    NonHDL 03/13/2022 98.80    Sodium 03/13/2022 139    Potassium 03/13/2022 4.5    Chloride 03/13/2022 101    CO2 03/13/2022 32    Glucose, Bld 03/13/2022 93    BUN 03/13/2022 15    Creatinine, Ser 03/13/2022 0.92    Total Bilirubin 03/13/2022 0.7    Alkaline Phosphatase 03/13/2022 58    AST 03/13/2022 26    ALT 03/13/2022 21    Total Protein 03/13/2022 6.8    Albumin 03/13/2022 4.4    GFR 03/13/2022 83.10    Calcium 03/13/2022 10.0    WBC 03/13/2022 5.3    RBC 03/13/2022 5.13    Platelets 03/13/2022 174.0    Hemoglobin 03/13/2022 16.7    HCT 03/13/2022 49.6    MCV 03/13/2022 96.7    MCHC 03/13/2022 33.7    RDW 03/13/2022 13.9    No image results found.   DG Chest Portable 1 View  Result Date: 12/06/2022 CLINICAL DATA:  Chest and flank pain. EXAM: PORTABLE CHEST 1 VIEW COMPARISON:  Radiograph 10/17/2019, lung bases from abdominopelvic CT earlier today FINDINGS: Borderline cardiomegaly. Clips projecting over the right mediastinum correspond with soft a geode clips. No focal airspace disease, pleural effusion, or pneumothorax. No pulmonary edema. Left shoulder arthropathy. IMPRESSION: Borderline cardiomegaly. No acute chest findings. Electronically Signed   By: Narda Rutherford M.D.   On: 12/06/2022 19:19   CT ABDOMEN PELVIS WO CONTRAST  Result Date: 12/06/2022 CLINICAL DATA:  Right flank pain, vomiting, hematuria EXAM: CT ABDOMEN AND PELVIS WITHOUT CONTRAST TECHNIQUE: Multidetector CT imaging of the abdomen and pelvis was performed following the standard protocol without IV contrast. RADIATION DOSE REDUCTION: This  exam was performed according to the departmental dose-optimization program which includes automated exposure control, adjustment of the mA and/or kV according to patient size and/or use of iterative reconstruction technique. COMPARISON:  01/21/2021 FINDINGS: Lower chest: Coronary artery disease and aortic atherosclerosis. No acute abnormality. Hepatobiliary: No focal hepatic abnormality. Gallbladder unremarkable. Pancreas: No focal abnormality or ductal dilatation. Spleen: No focal abnormality.  Normal size. Adrenals/Urinary Tract: Normal adrenal glands. Numerous bilateral renal cysts are stable since prior study and likely reflect benign cysts. No follow-up imaging recommended. Mild right hydronephrosis and perinephric stranding due to 3 mm mid right ureteral stone. Urinary bladder unremarkable. Stomach/Bowel: Colonic diverticulosis. No active diverticulitis. Normal appendix. Prior gastric sleeve. No bowel obstruction. Vascular/Lymphatic: No evidence of aneurysm or adenopathy. Aortic atherosclerosis. Reproductive: No visible focal abnormality. Other: No free fluid or free air. Musculoskeletal: Prior right hip replacement. No acute bony abnormality. IMPRESSION: 3 mm mid right ureteral stone with mild  right hydronephrosis. Colonic diverticulosis.  No active diverticulitis. Aortic atherosclerosis. Electronically Signed   By: Charlett Nose M.D.   On: 12/06/2022 17:21    DG Chest Portable 1 View  Result Date: 12/06/2022 CLINICAL DATA:  Chest and flank pain. EXAM: PORTABLE CHEST 1 VIEW COMPARISON:  Radiograph 10/17/2019, lung bases from abdominopelvic CT earlier today FINDINGS: Borderline cardiomegaly. Clips projecting over the right mediastinum correspond with soft a geode clips. No focal airspace disease, pleural effusion, or pneumothorax. No pulmonary edema. Left shoulder arthropathy. IMPRESSION: Borderline cardiomegaly. No acute chest findings. Electronically Signed   By: Narda Rutherford M.D.   On: 12/06/2022 19:19    CT ABDOMEN PELVIS WO CONTRAST  Result Date: 12/06/2022 CLINICAL DATA:  Right flank pain, vomiting, hematuria EXAM: CT ABDOMEN AND PELVIS WITHOUT CONTRAST TECHNIQUE: Multidetector CT imaging of the abdomen and pelvis was performed following the standard protocol without IV contrast. RADIATION DOSE REDUCTION: This exam was performed according to the departmental dose-optimization program which includes automated exposure control, adjustment of the mA and/or kV according to patient size and/or use of iterative reconstruction technique. COMPARISON:  01/21/2021 FINDINGS: Lower chest: Coronary artery disease and aortic atherosclerosis. No acute abnormality. Hepatobiliary: No focal hepatic abnormality. Gallbladder unremarkable. Pancreas: No focal abnormality or ductal dilatation. Spleen: No focal abnormality.  Normal size. Adrenals/Urinary Tract: Normal adrenal glands. Numerous bilateral renal cysts are stable since prior study and likely reflect benign cysts. No follow-up imaging recommended. Mild right hydronephrosis and perinephric stranding due to 3 mm mid right ureteral stone. Urinary bladder unremarkable. Stomach/Bowel: Colonic diverticulosis. No active diverticulitis. Normal appendix. Prior gastric sleeve. No bowel obstruction. Vascular/Lymphatic: No evidence of aneurysm or adenopathy. Aortic atherosclerosis. Reproductive: No visible focal abnormality. Other: No free fluid or free air. Musculoskeletal: Prior right hip replacement. No acute bony abnormality. IMPRESSION: 3 mm mid right ureteral stone with mild right hydronephrosis. Colonic diverticulosis.  No active diverticulitis. Aortic atherosclerosis. Electronically Signed   By: Charlett Nose M.D.   On: 12/06/2022 17:21        Additional Notes:   This encounter employed real-time, collaborative documentation. The patient actively reviewed and updated their medical record on a shared screen, ensuring transparency and facilitating joint problem-solving  for the problem list, overview, and plan. This approach promotes accurate, informed care. The treatment plan was discussed and reviewed in detail, including medication safety, potential side effects, and all patient questions. We confirmed understanding and comfort with the plan. Follow-up instructions were established, including contacting the office for any concerns, returning if symptoms worsen, persist, or new symptoms develop, and precautions for potential emergency department visits. ----------------------------------------------------- Lula Olszewski, MD  12/22/2022 9:52 PM  [facility] at [facility]:  [phone]

## 2022-12-22 NOTE — Progress Notes (Unsigned)
Enrolled for [employer] to mail a ZIO XT long term holter monitor to the patients address on file.  

## 2022-12-22 NOTE — Telephone Encounter (Signed)
Corky Crafts, MD  Dossie Arbour, RN; Lula Olszewski, MD Pat,  Please refer to EP for consideration of ICD, or BiV ICD given persistent low EF and LBBB. Can also plan for 2 week Zio patch given recent palpitations.  JV   I placed call to patient to make him aware of recommendations from Dr Eldridge Dace.  Left message to call office

## 2022-12-22 NOTE — Assessment & Plan Note (Signed)
Kidney Stone He presents with right-sided pain and hematuria for 4 weeks, with a CT confirming the presence of a stone. He is currently managing pain effectively on Flomax and lithotripsy is considered pending the stone's natural passage. We will collect urine for stone analysis and check kidney function and uric acid levels. Lithotripsy is postponed until cardiac clearance is obtained.  Gout He is on Allopurinol with no recent flares reported. Uric acid level will be checked. Due to stone although last check was good

## 2022-12-22 NOTE — Patient Instructions (Signed)
VISIT SUMMARY:  During your visit, we discussed your ongoing issues with a kidney stone, possible cardiac arrhythmia, gout, and depression. We have outlined a plan to manage each of these conditions.  YOUR PLAN:  -KIDNEY STONE: You have a kidney stone causing pain and blood in your urine. We will test your urine and check your kidney function. We are considering a procedure called lithotripsy to break up the stone, but we need to make sure your heart is healthy enough for this procedure first.  -POSSIBLE CARDIAC ARRHYTHMIA: You had a recent episode of severe chest pain, which could be due to an irregular heartbeat. We will arrange for you to see a heart specialist and possibly have some heart tests. We need to make sure your heart is healthy before we can proceed with the lithotripsy for your kidney stone.  -GOUT: You have a condition called gout, which can cause painful swelling in your joints. You are currently taking a medication called Allopurinol to manage this condition, and we will check your uric acid level to see how well it's working.  -DEPRESSION: You have been feeling down, which is a symptom of depression. You are currently taking a medication called Zoloft to help with this, and we will continue this treatment.  INSTRUCTIONS:  Please follow up in 1-3 months, or sooner if your symptoms worsen. In the meantime, continue taking your medications as prescribed. If you have any concerns or if your symptoms change, please contact us immediately.

## 2022-12-22 NOTE — Assessment & Plan Note (Signed)
He exhibits mild symptoms of depression and is currently on Zoloft, which will be continued. Mainly this is health driven.

## 2022-12-22 NOTE — Telephone Encounter (Signed)
I spoke with patient and gave him recommendations from Dr Eldridge Dace.  Patient reports he sweats a great deal and had trouble wearing monitor last time due to sweating.  He is willing to come into the office to have monitor applied if needed. Will ask someone from our monitor to call him with suggestions.

## 2022-12-22 NOTE — Telephone Encounter (Signed)
Follow Up:     Patient said he was returning a call from today. He said he did not know who called.;

## 2022-12-23 LAB — URINE CULTURE
MICRO NUMBER:: 15374029
Result:: NO GROWTH
SPECIMEN QUALITY:: ADEQUATE

## 2022-12-25 ENCOUNTER — Ambulatory Visit: Payer: Medicare Other | Attending: Internal Medicine

## 2022-12-25 DIAGNOSIS — R002 Palpitations: Secondary | ICD-10-CM | POA: Diagnosis not present

## 2022-12-25 NOTE — Progress Notes (Unsigned)
ZIO XT serial # [id number] applied to patient using tincture on benzoin.

## 2022-12-26 ENCOUNTER — Encounter: Payer: Self-pay | Admitting: Physician Assistant

## 2022-12-26 ENCOUNTER — Telehealth: Payer: Self-pay | Admitting: *Deleted

## 2022-12-26 ENCOUNTER — Ambulatory Visit: Payer: Medicare Other | Attending: Physician Assistant | Admitting: Physician Assistant

## 2022-12-26 VITALS — BP 110/70 | HR 53 | Ht 74.0 in | Wt 275.0 lb

## 2022-12-26 DIAGNOSIS — E785 Hyperlipidemia, unspecified: Secondary | ICD-10-CM

## 2022-12-26 DIAGNOSIS — Z01818 Encounter for other preprocedural examination: Secondary | ICD-10-CM | POA: Diagnosis not present

## 2022-12-26 DIAGNOSIS — G4733 Obstructive sleep apnea (adult) (pediatric): Secondary | ICD-10-CM

## 2022-12-26 DIAGNOSIS — I4819 Other persistent atrial fibrillation: Secondary | ICD-10-CM | POA: Diagnosis not present

## 2022-12-26 DIAGNOSIS — I2583 Coronary atherosclerosis due to lipid rich plaque: Secondary | ICD-10-CM

## 2022-12-26 DIAGNOSIS — I428 Other cardiomyopathies: Secondary | ICD-10-CM | POA: Diagnosis not present

## 2022-12-26 DIAGNOSIS — I2510 Atherosclerotic heart disease of native coronary artery without angina pectoris: Secondary | ICD-10-CM

## 2022-12-26 DIAGNOSIS — R002 Palpitations: Secondary | ICD-10-CM | POA: Diagnosis not present

## 2022-12-26 DIAGNOSIS — I10 Essential (primary) hypertension: Secondary | ICD-10-CM

## 2022-12-26 NOTE — Progress Notes (Signed)
Cardiology Office Note:  .   Date:  12/26/2022  ID:  Isaiah Jones, DOB 14-Nov-1950, MRN [id number] PCP: Lula Olszewski, MD  [facility] Providers Cardiologist:  Lance Muss, MD    History of Present Illness: .   Isaiah Jones is a 72 y.o. male  with history of hyperlipidemia, hypertension and obesity, Afib on toprol and xarelto. DCCV was considered but advised to lose weight. Eventually had unsuccessful cardioversion. 03/2022 decreased EF cath 08/2022 mild to mod  nonobstructive disease.  Patient went to ED with kidney stones 12/06/22 and had chest pain as well. Troponins were negative.   Currently referred to EPS for possible ICD/BiV ICD and wearing Zio.   He was placed on my schedule for preop clearance for Dr. Modena Slater with [facility]. No request has been sent yet. Patient denies chest pain, palpitations, edema. Has chronic DOE unchanged.   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 Exam Location:  [address]  Procedure: 2D Echo, Cardiac Doppler and Color Doppler  Indications:    I48.91 Atrial fibrillation  History:        Patient has prior history of Echocardiogram examinations, most recent 04/17/2022. Arrythmias:Atrial Fibrillation and LBBB; Risk Factors:Morbid obesity, Hypertension, Dyslipidemia and Former Smoker.  Sonographer:    Samule Ohm RDCS Referring Phys: [id number] JAYADEEP S VARANASI  IMPRESSIONS   1. Left ventricular ejection fraction, by estimation, is 25 to 30%. The left ventricle has severely  decreased function. The left ventricle demonstrates global hypokinesis. The left ventricular internal cavity size was mildly dilated. There is mild concentric left ventricular hypertrophy. Left ventricular diastolic function could not be evaluated. 2. Right ventricular systolic function is normal. The right ventricular size is mildly enlarged. 3. Left atrial size was severely dilated. 4. Right atrial size was severely dilated. 5. The mitral valve is normal in structure. Mild mitral valve regurgitation. No evidence of mitral stenosis. 6. The aortic valve is tricuspid. Aortic valve regurgitation is not visualized. No aortic stenosis is present. 7. Aortic dilatation noted. There is mild dilatation of the aortic root, measuring 41 mm. 8. The inferior vena cava is normal in size with greater than 50% respiratory variability, suggesting right atrial pressure of 3 mmHg.  FINDINGS Left Ventricle: Left ventricular ejection fraction, by estimation, is 25 to 30%. The left ventricle has severely decreased function. The left ventricle demonstrates global hypokinesis. The left ventricular internal cavity size was mildly dilated. There is mild concentric left ventricular hypertrophy. Abnormal (paradoxical) septal motion, consistent with left bundle branch block. Left ventricular diastolic function could not be evaluated  due to atrial fibrillation. Left ventricular diastolic function could not be evaluated.  Right Ventricle: The right ventricular size is mildly enlarged. Right ventricular systolic function is normal.  Left Atrium: Left atrial size was severely dilated.  Right Atrium: Right atrial size was severely dilated.  Pericardium: There is no evidence of pericardial effusion.  Mitral Valve: The mitral valve is normal in structure. Mild mitral valve regurgitation. No evidence of mitral valve stenosis.  Tricuspid Valve: The tricuspid valve is normal in structure. Tricuspid valve regurgitation is  trivial. No evidence of tricuspid stenosis.  Aortic Valve: The aortic valve is tricuspid. Aortic valve regurgitation is not visualized. No aortic stenosis is present.  Pulmonic Valve: The pulmonic valve was normal in structure. Pulmonic valve regurgitation is not visualized. No evidence of pulmonic stenosis.  Aorta: Aortic dilatation noted. There is mild dilatation of the aortic root, measuring 41 mm.  Venous: The inferior vena cava is normal in size with greater than 50% respiratory variability, suggesting right atrial pressure of 3 mmHg.  IAS/Shunts: No atrial level shunt detected by color flow Doppler.   LEFT VENTRICLE PLAX 2D LVIDd:         5.80 cm   Diastology LVIDs:         4.90 cm   LV e' medial:    8.40 cm/s LV PW:         1.40 cm   LV E/e' medial:  10.3 LV IVS:        1.30 cm   LV e' lateral:   11.00 cm/s LVOT diam:     2.10 cm   LV E/e' lateral: 7.9 LV SV:         48 LV SV Index:   18 LVOT Area:     3.46 cm   RIGHT VENTRICLE             IVC RV S prime:     11.83 cm/s  IVC diam: 1.10 cm TAPSE (M-mode): 1.8 cm  LEFT ATRIUM              Index        RIGHT ATRIUM           Index LA diam:        6.00 cm  2.30 cm/m   RA Pressure: 3.00 mmHg LA Vol (A2C):   150.0 ml 57.45 ml/m  RA Area:     30.40 cm LA Vol (A4C):   139.0 ml 53.24 ml/m  RA Volume:   112.00 ml 42.90 ml/m LA Biplane Vol: 144.0 ml 55.16 ml/m AORTIC VALVE LVOT Vmax:   76.30 cm/s LVOT Vmean:  51.060 cm/s LVOT VTI:    0.138 m  AORTA Ao Root diam: 4.10 cm Ao Asc diam:  3.80 cm  MITRAL VALVE               TRICUSPID VALVE MV Area (PHT): 3.87 cm    Estimated RAP:  3.00 mmHg MV Decel Time: 196 msec MV E velocity: 86.88 cm/s  SHUNTS Systemic VTI:  0.14 m Systemic Diam: 2.10 cm  Olga Millers MD Electronically signed by Olga Millers MD Signature Date/Time: 08/23/2022/8:53:15 AM    Final    Cath in 5/24 showed: "Prox RCA lesion is 50% stenosed.  RFR 0.99.  Not significant.   Ost Cx to Prox Cx  lesion is 25% stenosed.   1st Diag lesion is 50% stenosed.   Mid LAD lesion is 25% stenosed.   There is moderate to severe left ventricular  systolic dysfunction.   LV end diastolic pressure is normal.   The left ventricular ejection fraction is 30-35% by visual estimate.   There is no aortic valve stenosis.   Aortic saturation 91%, PA saturation 72%, mean RA pressure 4 mmHg, PA pressure 21/12, mean PA pressure 17 mmHg, mean pulmonary capillary wedge pressure 8 mmHg, cardiac output 8.15 L/min, cardiac index 3.18.   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  Normal right heart pressures.  Continue aggressive medical therapy for LV dysfunction, atrial fibrillation and coronary artery disease.  If he does develop significant heart failure symptoms, would have to consider biventricular pacing given his left bundle branch block."    Risk Assessment/Calculations:    CHA2DS2-VASc Score = 4   This indicates a 4.8% annual risk of stroke. The patient's score is based upon: CHF History: 1 HTN History: 1 Diabetes History: 0 Stroke History: 0 Vascular Disease History: 1 Age Score: 1 Gender Score: 0            Physical Exam:   VS:  BP 110/70   Pulse (!) 53   Ht 6\' 2"  (1.88 m)   Wt 275 lb (124.7 kg)   SpO2 96%   BMI 35.31 kg/m    Wt Readings from Last 3 Encounters:  12/26/22 275 lb (124.7 kg)  12/22/22 278 lb (126.1 kg)  12/06/22 276 lb (125.2 kg)    GEN: Well nourished, well developed in no acute distress NECK: No JVD; No carotid bruits CARDIAC:  RRR, no murmurs, rubs, gallops RESPIRATORY:  Clear to auscultation without rales, wheezing or rhonchi  ABDOMEN: Soft, non-tender, non-distended EXTREMITIES:  No edema; No deformity   ASSESSMENT AND PLAN: .   Preop clearance for kidney stones-has an appt in 2 days to see if he needs lithotripsy. In light of his LVD and possible need for ICD I reached out to Dr. Eldridge Dace who concurs he can have lithotripsy if needed. He is at  higher risk based on RCRI but METs >6. Pharmacy will have to weigh in on Xarelto hold if needed.  According to the Revised Cardiac Risk Index (RCRI), his Perioperative Risk of Major Cardiac Event is (%): 6.6  His Functional Capacity in METs is: 6.61 according to the Duke Activity Status Index (DASI).   Palpitations-wearing Zio  NICM EF 25% and LBBB-referred to EPS for ICD or BiV ICD  Afib on metoprolol and Xarelto  CAD-cath 08/2022 mild to mod  nonobstructive disease.  HTN BP controlled   HLD LDL 18  Morbid obesity  OSA-he's working on getting an APAC  some type of auto titration of the oxygen that is bled in to the system.  He had night time hypoxemia on a sleep study several years ago.  I offered an appointment with Dr. Mayford Knife.  He will think about it and if he does want to have different sleep apnea management, he will let us know.        Dispo:    Signed, Jacolyn Reedy, PA-C

## 2022-12-26 NOTE — Telephone Encounter (Signed)
Corky Crafts, LPN reached out to me this morning stating pt has appt today with Jacolyn Reedy, PAC, which appt notes state need pre op clearance. I do not see that our office received a clearance request for the pt for any procedure. Per the appt notes pt states he has a kidney stone and needs surgery.   I called the pt and left a message to call back to our pre op team and let us now the name of the doctor or the name of their office. I will then be happy to reach out to the surgeon's office to obtain the needed information in order for our provider to be able to make a well informed assessment.

## 2022-12-26 NOTE — Telephone Encounter (Signed)
I spoke to [facility]. She stated that pt is scheduled for a follow up appt 08/29 and the dr will decide then if pt is in need of surgery to remove stone.

## 2022-12-26 NOTE — Telephone Encounter (Signed)
Patient is returning call.  He states that Dr's name is Dr. Modena Slater with [facility]. Tele # [phone]

## 2022-12-26 NOTE — Patient Instructions (Addendum)
Medication Instructions:   Your physician recommends that you continue on your current medications as directed. Please refer to the Current Medication list given to you today.  *If you need a refill on your cardiac medications before your next appointment, please call your pharmacy*     Follow-Up:   1.) 4 MONTHS WITH DR. Gerri Spore O'NEAL TO ESTABLISH AS YOUR NEW CARDIOLOGIST

## 2022-12-27 ENCOUNTER — Encounter: Payer: Self-pay | Admitting: Internal Medicine

## 2022-12-27 DIAGNOSIS — N182 Chronic kidney disease, stage 2 (mild): Secondary | ICD-10-CM | POA: Insufficient documentation

## 2022-12-27 NOTE — Progress Notes (Signed)
Your recent test results are in, and I'd like to share some important updates with you. Your comprehensive metabolic panel shows mostly normal results, which is good news. Your kidney and liver function tests are within normal ranges, indicating these organs are functioning well. Your blood sugar (glucose) and hemoglobin A1C levels are also normal, suggesting good blood sugar control. However, your HDL cholesterol (the "good" cholesterol) is slightly low at 38.90 mg/dL. While this isn't cause for immediate concern, we'll continue to monitor it closely. Your vitamin D level has improved to 32.35 ng/mL, which is now within the normal range. This is great progress from your previous results. The urinalysis shows some abnormalities that require further attention:  There's blood in your urine (hematuria) A small amount of bilirubin was detected  These findings don't necessarily indicate a serious problem, but they do warrant further investigation. The urine culture results are still pending, which will help [place] determine if there's an infection. Based on these results, here's our plan:  Continue your current medications, including those for heart health and blood thinning. Increase your intake of foods rich in HDL cholesterol, such as olive oil, fatty fish, and nuts. We'll repeat the urinalysis in 2-4 weeks to check if the blood in urine persists. If you notice any urinary symptoms (pain, frequency, urgency), please contact us immediately. We'll schedule you for a follow-up appointment in 4-6 weeks to discuss these results in detail and potentially adjust your treatment plan.  These results help rule out immediate concerns about kidney function or diabetes complications. However, we need to investigate the cause of the blood in your urine further. Remember, your overall health is more than just these numbers. Keep up with your heart-healthy lifestyle, CPAP use, and medication regimen. If you have any  questions or concerns before our next appointment, please don't hesitate to reach out. Stay well, Glenetta Hew, MD [facility] [phone]

## 2023-01-02 ENCOUNTER — Other Ambulatory Visit: Payer: Self-pay | Admitting: Urology

## 2023-01-02 ENCOUNTER — Telehealth: Payer: Self-pay | Admitting: Interventional Cardiology

## 2023-01-02 NOTE — Telephone Encounter (Signed)
   Patient Name: Isaiah Jones  DOB: 1951/03/19 MRN: [id number]  Primary Cardiologist: Lance Muss, MD  Clinical pharmacists have reviewed the patient's past medical history, labs, and current medications as part of preoperative protocol coverage. The following recommendations have been made:    Patient with diagnosis of afib on Xarelto for anticoagulation.     Procedure: right ureteroscopy Date of procedure: 01/19/23   CHA2DS2-VASc Score = 4  This indicates a 4.8% annual risk of stroke. The patient's score is based upon: CHF History: 1 HTN History: 1 Diabetes History: 0 Stroke History: 0 Vascular Disease History: 1 Age Score: 1 Gender Score: 0   CrCl 39mL/min using adjusted body weight Platelet count 212K   Per office protocol, patient can hold Xarelto for 3 days prior to procedure as requested.  Please resume Xarelto as soon as possible postprocedure, at the discretion of the surgeon.   Medical clearance was provided by Herma Carson, PA-C at office visit on 12/26/2022.  I will route this recommendation as well as the note from most recent office visit to the requesting party via Epic fax function and remove from pre-op pool.  Please call with questions.  Joylene Grapes, NP 01/02/2023, 12:02 PM

## 2023-01-02 NOTE — Telephone Encounter (Signed)
Patient with diagnosis of afib on Xarelto for anticoagulation.    Procedure: right ureteroscopy Date of procedure: 01/19/23  CHA2DS2-VASc Score = 4  This indicates a 4.8% annual risk of stroke. The patient's score is based upon: CHF History: 1 HTN History: 1 Diabetes History: 0 Stroke History: 0 Vascular Disease History: 1 Age Score: 1 Gender Score: 0   CrCl 85mL/min using adjusted body weight Platelet count 212K  Per office protocol, patient can hold Xarelto for 3 days prior to procedure as requested.    **This guidance is not considered finalized until pre-operative APP has relayed final recommendations.**

## 2023-01-02 NOTE — Telephone Encounter (Signed)
   Pre-operative Risk Assessment    Patient Name: Isaiah Jones  DOB: 1950/05/15 MRN: [id number]      Request for Surgical Clearance    Procedure:   Right Ureteroscopy  Date of Surgery:  Clearance 01/19/23                                 Surgeon:  Dr. Alvester Morin Surgeon's Group or Practice Name:  [facility] Phone number:  [phone]  Fax number:  [phone]   Type of Clearance Requested:   - Pharmacy:  Hold Rivaroxaban (Xarelto) Hold 3 days prior   Type of Anesthesia:  General    Additional requests/questions:  Please advise surgeon/provider what medications should be held.  Signed, Belisicia T Woods   01/02/2023, 10:17 AM

## 2023-01-05 ENCOUNTER — Emergency Department (HOSPITAL_COMMUNITY)
Admission: EM | Admit: 2023-01-05 | Discharge: 2023-01-05 | Disposition: A | Discharge status: Home / Self Care | Payer: Medicare Other | Attending: Emergency Medicine | Admitting: Emergency Medicine

## 2023-01-05 ENCOUNTER — Other Ambulatory Visit: Payer: Self-pay

## 2023-01-05 ENCOUNTER — Encounter (HOSPITAL_COMMUNITY): Payer: Self-pay | Admitting: Emergency Medicine

## 2023-01-05 ENCOUNTER — Emergency Department (HOSPITAL_COMMUNITY): Payer: Medicare Other

## 2023-01-05 DIAGNOSIS — I77811 Abdominal aortic ectasia: Secondary | ICD-10-CM | POA: Insufficient documentation

## 2023-01-05 DIAGNOSIS — Z7901 Long term (current) use of anticoagulants: Secondary | ICD-10-CM | POA: Diagnosis not present

## 2023-01-05 DIAGNOSIS — N200 Calculus of kidney: Secondary | ICD-10-CM | POA: Diagnosis not present

## 2023-01-05 DIAGNOSIS — R911 Solitary pulmonary nodule: Secondary | ICD-10-CM | POA: Diagnosis not present

## 2023-01-05 DIAGNOSIS — M62830 Muscle spasm of back: Secondary | ICD-10-CM

## 2023-01-05 DIAGNOSIS — M549 Dorsalgia, unspecified: Secondary | ICD-10-CM | POA: Insufficient documentation

## 2023-01-05 LAB — URINALYSIS, ROUTINE W REFLEX MICROSCOPIC
Bacteria, UA: NONE SEEN
Bilirubin Urine: NEGATIVE
Glucose, UA: NEGATIVE mg/dL
Ketones, ur: 20 mg/dL — AB
Leukocytes,Ua: NEGATIVE
Nitrite: NEGATIVE
Protein, ur: NEGATIVE mg/dL
Specific Gravity, Urine: 1.013 (ref 1.005–1.030)
pH: 6 (ref 5.0–8.0)

## 2023-01-05 LAB — COMPREHENSIVE METABOLIC PANEL
ALT: 11 U/L (ref 0–44)
AST: 13 U/L — ABNORMAL LOW (ref 15–41)
Albumin: 3.3 g/dL — ABNORMAL LOW (ref 3.5–5.0)
Alkaline Phosphatase: 61 U/L (ref 38–126)
Anion gap: 10 (ref 5–15)
BUN: 13 mg/dL (ref 8–23)
CO2: 25 mmol/L (ref 22–32)
Calcium: 8.6 mg/dL — ABNORMAL LOW (ref 8.9–10.3)
Chloride: 96 mmol/L — ABNORMAL LOW (ref 98–111)
Creatinine, Ser: 0.87 mg/dL (ref 0.61–1.24)
GFR, Estimated: >60 mL/min (ref >60)
Glucose, Bld: 107 mg/dL — ABNORMAL HIGH (ref 70–99)
Potassium: 4.2 mmol/L (ref 3.5–5.1)
Sodium: 131 mmol/L — ABNORMAL LOW (ref 135–145)
Total Bilirubin: 1.7 mg/dL — ABNORMAL HIGH (ref 0.3–1.2)
Total Protein: 6.5 g/dL (ref 6.5–8.1)

## 2023-01-05 LAB — CBC WITH DIFFERENTIAL/PLATELET
Abs Immature Granulocytes: 0.03 10*3/uL (ref 0.00–0.07)
Basophils Absolute: 0 10*3/uL (ref 0.0–0.1)
Basophils Relative: 0 %
Eosinophils Absolute: 0 10*3/uL (ref 0.0–0.5)
Eosinophils Relative: 0 %
HCT: 42.7 % (ref 39.0–52.0)
Hemoglobin: 14.2 g/dL (ref 13.0–17.0)
Immature Granulocytes: 0 %
Lymphocytes Relative: 15 %
Lymphs Abs: 1.2 10*3/uL (ref 0.7–4.0)
MCH: 32.9 pg (ref 26.0–34.0)
MCHC: 33.3 g/dL (ref 30.0–36.0)
MCV: 98.8 fL (ref 80.0–100.0)
Monocytes Absolute: 1 10*3/uL (ref 0.1–1.0)
Monocytes Relative: 12 %
Neutro Abs: 5.7 10*3/uL (ref 1.7–7.7)
Neutrophils Relative %: 73 %
Platelets: 177 10*3/uL (ref 150–400)
RBC: 4.32 MIL/uL (ref 4.22–5.81)
RDW: 14.1 % (ref 11.5–15.5)
WBC: 8 10*3/uL (ref 4.0–10.5)
nRBC: 0 % (ref 0.0–0.2)

## 2023-01-05 MED ORDER — FENTANYL CITRATE PF 50 MCG/ML IJ SOSY
50.0000 ug | PREFILLED_SYRINGE | Freq: Once | INTRAMUSCULAR | Status: AC
  Administered 2023-01-05: 50 ug via INTRAVENOUS
  Filled 2023-01-05: qty 1

## 2023-01-05 MED ORDER — KETOROLAC TROMETHAMINE 30 MG/ML IJ SOLN: 30.0000 mg | Freq: Once | INTRAMUSCULAR | Status: DC

## 2023-01-05 MED ORDER — IOHEXOL 350 MG/ML SOLN
100.0000 mL | Freq: Once | INTRAVENOUS | Status: AC | PRN
  Administered 2023-01-05: 100 mL via INTRAVENOUS

## 2023-01-05 MED ORDER — DIAZEPAM 5 MG/ML IJ SOLN
2.5000 mg | Freq: Once | INTRAMUSCULAR | Status: AC
  Administered 2023-01-05: 2.5 mg via INTRAVENOUS
  Filled 2023-01-05: qty 2

## 2023-01-05 MED ORDER — METHOCARBAMOL 750 MG PO TABS: 750.0000 mg | ORAL_TABLET | Freq: Four times a day (QID) | ORAL | 0 refills | Status: DC

## 2023-01-05 MED ORDER — DEXAMETHASONE SODIUM PHOSPHATE 10 MG/ML IJ SOLN
10.0000 mg | Freq: Once | INTRAMUSCULAR | Status: AC
  Administered 2023-01-05: 10 mg via INTRAVENOUS
  Filled 2023-01-05: qty 1

## 2023-01-05 MED ORDER — KETOROLAC TROMETHAMINE 30 MG/ML IJ SOLN
15.0000 mg | Freq: Once | INTRAMUSCULAR | Status: AC
  Administered 2023-01-05: 15 mg via INTRAVENOUS
  Filled 2023-01-05: qty 1

## 2023-01-05 MED ORDER — HYDROMORPHONE HCL 1 MG/ML IJ SOLN
1.0000 mg | Freq: Once | INTRAMUSCULAR | Status: AC
  Administered 2023-01-05: 1 mg via INTRAVENOUS
  Filled 2023-01-05 (×2): qty 1

## 2023-01-05 MED ORDER — ONDANSETRON HCL 4 MG/2ML IJ SOLN
4.0000 mg | Freq: Once | INTRAMUSCULAR | Status: AC
  Administered 2023-01-05: 4 mg via INTRAVENOUS
  Filled 2023-01-05: qty 2

## 2023-01-05 MED ORDER — MORPHINE SULFATE (PF) 4 MG/ML IV SOLN
4.0000 mg | Freq: Once | INTRAVENOUS | Status: AC
  Administered 2023-01-05: 4 mg via INTRAVENOUS
  Filled 2023-01-05: qty 1

## 2023-01-05 NOTE — ED Provider Notes (Signed)
[name] EMERGENCY DEPARTMENT AT [facility] Provider Note   CSN: [id number] Arrival date & time: 01/05/23  1514     History  Chief Complaint  Patient presents with   Back Pain    Isaiah Jones is a 72 y.o. male who presents emergency department chief complaint of severe back pain.  Patient reports pain in the distribution of his mid back starting on the right and then progressively across to the left.  Isaiah Jones states this has been worsening for the past week.  Hurts to breathe, change position, lift his head or move his arms.  Isaiah Jones has a known obstructing kidney stone on the right side and is currently taking morphine which has not helped his pain at all.  Patient denies any known injuries to that region.   Back Pain      Home Medications Prior to Admission medications   Medication Sig Start Date End Date Taking? Authorizing Provider  acetaminophen (TYLENOL) 500 MG tablet Take 500-1,000 mg by mouth every 6 (six) hours as needed for moderate pain.    [provider]  allopurinol (ZYLOPRIM) 300 MG tablet Take 1 tablet (300 mg total) by mouth daily. 10/11/22   Lula Olszewski, MD  amoxicillin (AMOXIL) 250 MG capsule Take 1,000 mg by mouth See admin instructions. Take 1000 mg 1 hour prior to dental work    [provider]  Azelastine HCl 137 MCG/SPRAY SOLN Place 2 sprays into both nostrils daily as needed (allergies). 06/14/21   [provider]  Calcium Carb-Cholecalciferol (CALCIUM-VITAMIN D) 600-400 MG-UNIT TABS Take 1 tablet by mouth 3 (three) times daily.    [provider]  colchicine 0.6 MG tablet Take 1 tablet (0.6 mg total) by mouth daily. Patient taking differently: Take 0.6 mg by mouth daily as needed (gout flares). 10/02/22   Lula Olszewski, MD  diclofenac Sodium (VOLTAREN) 1 % GEL Apply 4 g topically 4 (four) times daily as needed. 05/26/22   Lula Olszewski, MD  diphenhydramine-acetaminophen (TYLENOL PM) 25-500 MG TABS tablet Take  2 tablets by mouth at bedtime.     [provider]  Evolocumab (REPATHA) 140 MG/ML SOSY Inject 140 mg into the skin every 14 (fourteen) days. 09/15/22   Lula Olszewski, MD  fexofenadine (ALLEGRA) 180 MG tablet Take 180 mg by mouth daily as needed for allergies or rhinitis.    [provider]  Ginkgo 60 MG TABS Take 60 mg by mouth daily.    [provider]  latanoprost (XALATAN) 0.005 % ophthalmic solution Place 1 drop into both eyes at bedtime.  01/31/17   [provider]  methocarbamol (ROBAXIN) 750 MG tablet Take 1 tablet (750 mg total) by mouth every 6 (six) hours as needed for muscle spasms. 10/22/19   Elodia Florence, PA-C  metoprolol succinate (TOPROL XL) 25 MG 24 hr tablet Take 1 tablet (25 mg total) by mouth daily. 10/02/22   Corky Crafts, MD  mometasone (NASONEX) 50 MCG/ACT nasal spray Place 1 spray into the nose at bedtime.    [provider]  morphine (MSIR) 15 MG tablet Take 15 mg by mouth every 6 (six) hours. 12/14/22   [provider]  Multiple Vitamin (MULTIVITAMIN WITH MINERALS) TABS tablet Take 1 tablet by mouth 2 (two) times daily. Bariatric Multivitamin    [provider]  Multiple Vitamins-Minerals (AIRBORNE PO) Take 1 tablet by mouth daily as needed (immune health).     [provider]  Omega-3 Fatty  Acids (FISH OIL PO) Take 1 capsule by mouth daily.    [provider]  ondansetron (ZOFRAN-ODT) 4 MG disintegrating tablet Take 1 tablet (4 mg total) by mouth every 8 (eight) hours as needed for nausea or vomiting. 12/06/22   Elson Areas, PA-C  oxyCODONE-acetaminophen (PERCOCET) 5-325 MG tablet Take 1 tablet by mouth every 4 (four) hours as needed for severe pain. 12/06/22 12/06/23  Elson Areas, PA-C  oxyCODONE-acetaminophen (PERCOCET) 5-325 mg TABS tablet One tablet every 6 hours Patient not taking: Reported on 01/03/2023 12/06/22   Elson Areas, PA-C  oxyCODONE-acetaminophen (PERCOCET/ROXICET) 5-325  MG tablet Take 1 tablet by mouth every 6 (six) hours as needed for severe pain. Patient not taking: Reported on 01/03/2023 12/06/22   Carmel Sacramento A, PA-C  rivaroxaban (XARELTO) 20 MG TABS tablet Take 1 tablet (20 mg total) by mouth daily with supper. 11/13/22   Corky Crafts, MD  rosuvastatin (CRESTOR) 40 MG tablet Take 1 tablet (40 mg total) by mouth daily. 10/11/22   Lula Olszewski, MD  sacubitril-valsartan (ENTRESTO) 49-51 MG Take 1 tablet by mouth 2 (two) times daily. 05/19/22   Swinyer, Zachary George, NP  Semaglutide-Weight Management (WEGOVY) 1.7 MG/0.75ML SOAJ Inject 1.7 mg into the skin once a week. 10/20/22   Lula Olszewski, MD  sertraline (ZOLOFT) 50 MG tablet Take 1 tablet (50 mg total) by mouth daily. 11/22/22   Lula Olszewski, MD  spironolactone (ALDACTONE) 25 MG tablet Take 0.5 tablets (12.5 mg total) by mouth daily. 05/19/22 05/20/23  Swinyer, Zachary George, NP  tamsulosin (FLOMAX) 0.4 MG CAPS capsule Take 1 capsule (0.4 mg total) by mouth daily. 12/06/22   Elson Areas, PA-C  thiamine (VITAMIN B-1) 100 MG tablet Take 100 mg by mouth daily. 09/04/22   [provider]      Allergies    Erythromycin    Review of Systems   Review of Systems  Musculoskeletal:  Positive for back pain.    Physical Exam Updated Vital Signs BP (!) 142/78 (BP Location: Right Arm)   Pulse (!) 105   Temp 98.7 F (37.1 C) (Oral)   Resp 20   SpO2 98%  Physical Exam Vitals and nursing note reviewed.  Constitutional:      General: Isaiah Jones is not in acute distress.    Appearance: Isaiah Jones is well-developed. Isaiah Jones is not diaphoretic.  HENT:     Head: Normocephalic and atraumatic.  Eyes:     General: No scleral icterus.    Conjunctiva/sclera: Conjunctivae normal.  Cardiovascular:     Rate and Rhythm: Normal rate and regular rhythm.     Heart sounds: Normal heart sounds.  Pulmonary:     Effort: Pulmonary effort is normal. No respiratory distress.     Breath sounds: Normal breath sounds.  Abdominal:      General: There is no distension.     Palpations: Abdomen is soft.     Tenderness: There is no abdominal tenderness.  Musculoskeletal:     Cervical back: Normal range of motion and neck supple.     Comments: No midline spinal tenderness, no significant spasm in the upper back.  Minor tenderness more so on the left side of the upper back.  Skin:    General: Skin is warm and dry.  Neurological:     Mental Status: Isaiah Jones is alert.  Psychiatric:        Behavior: Behavior normal.     ED Results / Procedures / Treatments  Labs (all labs ordered are listed, but only abnormal results are displayed) Labs Reviewed  COMPREHENSIVE METABOLIC PANEL  CBC WITH DIFFERENTIAL/PLATELET  URINALYSIS, ROUTINE W REFLEX MICROSCOPIC    EKG None  Radiology No results found.  Procedures Procedures    Medications Ordered in ED Medications  HYDROmorphone (DILAUDID) injection 1 mg (has no administration in time range)  ondansetron (ZOFRAN) injection 4 mg (has no administration in time range)    ED Course/ Medical Decision Making/ A&P Clinical Course as of 01/05/23 2303  Fri Jan 05, 2023  8026 72 year old male here with severe back pain proportion to examination.  Differential diagnosis includes musculoskeletal spasm, pathologic fracture, dissection, diaphragmatic irritation, retroperitoneal infection.  Patient receiving pain medication, labs ordered, will obtain CT angio chest abdomen pelvis for dissection. [AH]  1816 Sodium(!): 131 [AH]  1817 Urinalysis, Routine w reflex microscopic -Urine, Clean Catch(!) Urine does not appear infected [AH]  2044 CT Angio Chest/Abd/Pel for Dissection W and/or Wo Contrast [AH]    Clinical Course User Index [AH] Arthor Captain, PA-C                                 Medical Decision Making Patient here with severe middle back pain.  Differential diagnosis includes pneumonia, PE, aortic dissection. Follow-up with flexion.  I ordered labs.  No evidence of  urinary tract infection, CMP with mildly elevated blood glucose of insignificant value, CBC without any abnormalities.  I ordered, visualized and interpreted CT angiogram of the chest abdomen and pelvis.  Patient with small lung nodule, aortic ectasia.  Isaiah Jones has a kidney stone 3 mm at the UVJ without hydro ureteronephrosis.  Reviewed all findings with the patient.  Patient given multiple rounds of pain medication without any significant relief.  Patient does have Robaxin at home which she states did help and would like a refill.  Patient understands this is likely musculoskeletal.  Will have the patient follow closely with his PCP.  Isaiah Jones may get some benefit from outpatient PT eval and treat.  Discussed return precautions.  Amount and/or Complexity of Data Reviewed Labs: ordered. Decision-making details documented in ED Course. Radiology: ordered. Decision-making details documented in ED Course.  Risk Prescription drug management.            Final Clinical Impression(s) / ED Diagnoses Final diagnoses:  None    Rx / DC Orders ED Discharge Orders     None         Arthor Captain, PA-C 01/05/23 2306    Pricilla Loveless, MD 01/05/23 2308

## 2023-01-05 NOTE — Discharge Instructions (Signed)
1) follow up in 6 months for a noncontrast CT for your pulmonary nodule 2) follow-up in 5 years for an ultrasound of your abdominal aortic aneurysm due to your aortic ectasia. 3) follow-up with your primary care doctor on Monday to see if he can get a referral to physical therapy for your upper back pain and spasm.  Contact a health care provider if: You have pain that is not relieved with rest or medicine. You have increasing pain going down into your legs or buttocks. Your pain does not improve after 2 weeks. You have pain at night. You lose weight without trying. You have a fever or chills. You develop nausea or vomiting. You develop abdominal pain. Get help right away if: You develop new bowel or bladder control problems. You have unusual weakness or numbness in your arms or legs. You feel faint.

## 2023-01-05 NOTE — ED Triage Notes (Signed)
Pt reports mid upper back pain that radiates to both shoulders. Pt reports the pain is worse when he touches his back. Pt endorses nausea and denies fevers.

## 2023-01-08 ENCOUNTER — Telehealth: Payer: Self-pay | Admitting: Internal Medicine

## 2023-01-08 NOTE — Telephone Encounter (Signed)
Pt was in the ER with excruciating back pain and was told from ED Dr that he will need PT until he has a kidney removed on Friday. Please advise.

## 2023-01-08 NOTE — Telephone Encounter (Signed)
Patient called back and clarified that he is having a kidney stone removed. Per ED AVS, patient is advised to follow up with PCP to see if he could get a referral for PT to address his back pain/spasms.Next opening with pcp isn't until next week. Please Advise.

## 2023-01-09 NOTE — Progress Notes (Signed)
The patient was identified using 2 approved identifiers. All issues noted in this document were discussed and addressed, Mr Braz voiced understanding and agreement with all preoperative instructions. The patient was emailed the surgery instructions per his / her request.    Emailed to:  [email or web address]  The patient was instructed to call our Admitting Office [phone] or [phone]) to complete their Pre-surgical Interview.   Medication Reconciliation was done 01-03-2023.  COVID Vaccine received:  [x]  No []  Yes Date of any COVID positive Test in last 90 days:  None  PCP - Glenetta Hew, MD  Cardiologist - Lance Muss, MD  Pulmonology- Horald Pollen, NP at [facility] [place]   For OSA- CPAP  Chest x-ray - 12-06-22  1v  Epic EKG -  12-06-2022  Epic Stress Test -  ECHO - 08-23-2022  Epic Cardiac Cath - R/LHC by Dr. Reece Leader Monitor - 12-25-22  PCR screen: []  Ordered & Completed           []   No Order but Needs PROFEND           [x]   N/A for this surgery  Surgery Plan:  [x]  Ambulatory   []  Outpatient in bed  []  Admit  Anesthesia:    [x]  General  []  Spinal  []   Choice []   MAC  Bowel Prep - []  No  []   Yes ______  Pacemaker / ICD device [x]  No []  Yes   Spinal Cord Stimulator:[x]  No []  Yes       History of Sleep Apnea? []  No [x]  Yes   CPAP used?- []  No [x]  Yes    Does the patient monitor blood sugar?  []  No []  Yes  [x]  N/A  Patient has: [x]  NO Hx DM   []  Pre-DM   []  DM1  []   DM2 Last A1c was: normal 5.5 on 12-22-2022       GLP1 agonist / usual dose - WEGOVY  for weight loss    GLP1 instructions: Last shot: 12-30-2022  Blood Thinner / Instructions: Xarelto,   Hold x 3 days Ok'd by Bernadene Person, NP 01-02-23 note      Stopped 01-08-23 Aspirin Instructions:  ERAS Protocol Ordered: [x]  No  []  Yes Patient is to be NPO after: midnight prior  Comments: Patient is going to call Dr. Alvester Morin regarding his NPO status. His arrival time in 1:45 pm.  Activity level: Patient is  able to climb a flight of stairs without difficulty; []  No CP   but would have SOB. Patient can perform ADLs without assistance.   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  Patient verbalized understanding and agreement to the Pre-Surgical Instructions that were given to them at this PAT appointment. Patient was also educated of the need to review these PAT instructions again prior to his/surgery.I reviewed the appropriate phone numbers to call if they have any and questions or concerns.

## 2023-01-09 NOTE — Patient Instructions (Signed)
SURGICAL WAITING ROOM VISITATION Patients having surgery or a procedure may have no more than 2 support people in the waiting area - these visitors may rotate in the visitor waiting room.   Due to an increase in RSV and influenza rates and associated hospitalizations, children ages 26 and under may not visit patients in [facility] hospitals. If the patient needs to stay at the hospital during part of their recovery, the visitor guidelines for inpatient rooms apply.  PRE-OP VISITATION  Pre-op nurse will coordinate an appropriate time for 1 support person to accompany the patient in pre-op.  This support person may not rotate.  This visitor will be contacted when the time is appropriate for the visitor to come back in the pre-op area.  Please refer to the [facility] website for the visitor guidelines for Inpatients (after your surgery is over and you are in a regular room).  You are not required to quarantine at this time prior to your surgery. However, you must do this: Hand Hygiene often Do NOT share personal items Notify your provider if you are in close contact with someone who has COVID or you develop fever 100.4 or greater, new onset of sneezing, cough, sore throat, shortness of breath or body aches.  If you test positive for Covid or have been in contact with anyone that has tested positive in the last 10 days please notify you surgeon.    Your procedure is scheduled on:  Friday  January 12, 2023  Report to [facility] Main Entrance: [place] entrance where the [employer] is available.   Report to admitting at: 1:45 PM  Call this number if you have any questions or problems the morning of surgery [phone]  DO NOT EAT OR DRINK ANYTHING AFTER MIDNIGHT THE NIGHT PRIOR TO YOUR SURGERY / PROCEDURE.   FOLLOW BOWEL PREP AND ANY ADDITIONAL PRE OP INSTRUCTIONS YOU RECEIVED FROM YOUR SURGEON'S OFFICE!!!   Oral Hygiene is also important to reduce your risk of infection.         Remember - BRUSH YOUR TEETH THE MORNING OF SURGERY WITH YOUR REGULAR TOOTHPASTE  Do NOT smoke after Midnight the night before surgery.  STOP TAKING all Vitamins, Herbs and supplements 1 week before your surgery.   WEGOVY-  Stop this medication 7 days prior to surgery. You will resume your injections after your surgery.   XARELTO-   Stop taking Xarelto 3 days before surgery.  Last dose will be taken on: 01-09-23  (Tuesday)  Take ONLY these medicines the morning of surgery with A SIP OF WATER: tamsulosin (Flomax), allopurinol, You may use your Azelastine nasal spray if needed.  You may take Morphine OR Percocet OR Tylenol if needed for pain.    If You have been diagnosed with Sleep Apnea - Bring CPAP mask and tubing day of surgery. We will provide you with a CPAP machine on the day of your surgery.                   You may not have any metal on your body including  jewelry, and body piercing  Do not wear  lotions, powders, cologne, or deodorant  Men may shave face and neck.  Contacts, Hearing Aids, dentures or bridgework may not be worn into surgery. DENTURES WILL BE REMOVED PRIOR TO SURGERY PLEASE DO NOT APPLY "Poly grip" OR ADHESIVES!!!  You may bring a small overnight bag with you on the day of surgery, only pack items that  are not valuable. [facility] IS NOT RESPONSIBLE   FOR VALUABLES THAT ARE LOST OR STOLEN.   Patients discharged on the day of surgery will not be allowed to drive home.  Someone NEEDS to stay with you for the first 24 hours after anesthesia.  Do not bring your home medications to the hospital. The Pharmacy will dispense medications listed on your medication list to you during your admission in the Hospital.  Special Instructions: Bring a copy of your healthcare power of attorney and living will documents the day of surgery, if you wish to have them scanned into your [facility] Medical Records- EPIC  Please read over the following fact sheets you were given: IF  YOU HAVE QUESTIONS ABOUT YOUR PRE-OP INSTRUCTIONS, PLEASE CALL [phone].   [place] - Preparing for Surgery      YOU MAY USE AN ANTIBACTERIAL SOAP IF YOU DON'T HAVE CHG SOAP Before surgery, you can play an important role.  Because skin is not sterile, your skin needs to be as free of germs as possible.  You can reduce the number of germs on your skin by washing with CHG (chlorahexidine gluconate) soap before surgery.  CHG is an antiseptic cleaner which kills germs and bonds with the skin to continue killing germs even after washing. Please DO NOT use if you have an allergy to CHG or antibacterial soaps.  If your skin becomes reddened/irritated stop using the CHG and inform your nurse when you arrive at Short Stay. Do not shave (including legs and underarms) for at least 48 hours prior to the first CHG shower.  You may shave your face/neck.  Please follow these instructions carefully:  1.  Shower with CHG Soap the night before surgery and the  morning of surgery.  2.  If you choose to wash your hair, wash your hair first as usual with your normal  shampoo.  3.  After you shampoo, rinse your hair and body thoroughly to remove the shampoo.                             4.  Use CHG as you would any other liquid soap.  You can apply chg directly to the skin and wash.  Gently with a scrungie or clean washcloth.  5.  Apply the CHG Soap to your body ONLY FROM THE NECK DOWN.   Do not use on face/ open                           Wound or open sores. Avoid contact with eyes, ears mouth and genitals (private parts).                       Wash face,  Genitals (private parts) with your normal soap.             6.  Wash thoroughly, paying special attention to the area where your  surgery  will be performed.  7.  Thoroughly rinse your body with warm water from the neck down.  8.  DO NOT shower/wash with your normal soap after using and rinsing off the CHG Soap.            9.  Pat yourself dry with a clean  towel.            10.  Wear clean pajamas.  11.  Place clean sheets on your bed the night of your first shower and do not  sleep with pets.  ON THE DAY OF SURGERY : Do not apply any lotions/deodorants the morning of surgery.  Please wear clean clothes to the hospital/surgery center.    FAILURE TO FOLLOW THESE INSTRUCTIONS MAY RESULT IN THE CANCELLATION OF YOUR SURGERY  PATIENT SIGNATURE_________________________________  NURSE SIGNATURE__________________________________  ________________________________________________________________________

## 2023-01-09 NOTE — Telephone Encounter (Signed)
Due to last minute cancellation, patient has been scheduled for 9/11 @ 10:40 am with PCP.

## 2023-01-09 NOTE — Telephone Encounter (Signed)
Patient called requesting an update on PCP availability. I informed patient that I could either get him in with his provider next week or with another provider. I offered him to see Dr. Jimmey Ralph today @ 11 am but he declined. Patient is requesting to speak with Tiffany.

## 2023-01-10 ENCOUNTER — Other Ambulatory Visit: Payer: Self-pay

## 2023-01-10 ENCOUNTER — Encounter (HOSPITAL_COMMUNITY)
Admission: RE | Admit: 2023-01-10 | Discharge: 2023-01-10 | Disposition: A | Discharge status: Home / Self Care | Payer: Medicare Other | Source: Ambulatory Visit | Attending: Urology | Admitting: Urology

## 2023-01-10 ENCOUNTER — Encounter (HOSPITAL_COMMUNITY): Payer: Self-pay

## 2023-01-10 ENCOUNTER — Ambulatory Visit: Payer: Medicare Other | Admitting: Internal Medicine

## 2023-01-10 ENCOUNTER — Encounter: Payer: Self-pay | Admitting: Internal Medicine

## 2023-01-10 VITALS — BP 112/80 | HR 74 | Temp 97.7°F | Ht 74.0 in | Wt 264.8 lb

## 2023-01-10 DIAGNOSIS — M549 Dorsalgia, unspecified: Secondary | ICD-10-CM | POA: Diagnosis not present

## 2023-01-10 DIAGNOSIS — N200 Calculus of kidney: Secondary | ICD-10-CM

## 2023-01-10 HISTORY — DX: Chronic kidney disease, unspecified: N18.9

## 2023-01-10 MED ORDER — METHYLPREDNISOLONE ACETATE 80 MG/ML IJ SUSP
80.0000 mg | Freq: Once | INTRAMUSCULAR | Status: AC
Start: 2023-01-10 — End: 2023-01-10
  Administered 2023-01-10: 80 mg via INTRAMUSCULAR

## 2023-01-10 MED ORDER — DEXAMETHASONE 6 MG PO TABS
6.0000 mg | ORAL_TABLET | Freq: Every day | ORAL | 0 refills | Status: AC
Start: 2023-01-10 — End: 2023-01-15

## 2023-01-10 NOTE — Assessment & Plan Note (Signed)
The patient is scheduled for kidney stone removal soon and has ceased blood thinners in preparation. The current management plan will continue.

## 2023-01-10 NOTE — Progress Notes (Signed)
Anesthesia Chart Review   Case: [id number] Date/Time: 01/12/23 1545   Procedure: CYSTOSCOPY WITH RIGHT RETROGRADE PYELOGRAM, URETEROSCOPY WITH HOLMIUM LASER AND STENT PLACEMENT (Right) - 1 HR FOR CASE   Anesthesia type: General   Pre-op diagnosis: RIGHT URETERAL STONE   Location: [facility]OM 03 / [facility]   Surgeons: Crista Elliot, MD       DISCUSSION:72 y.o. 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 Modena Slater.   EF with 25-30%, pt referred to EPS for possible ICD.   Pt last seen by cardiology 12/26/2022. Per OV note, "Preop clearance for kidney stones-has an appt in 2 days to see if he needs lithotripsy. In light of his LVD and possible need for ICD I reached out to Dr. Eldridge Dace who concurs he can have lithotripsy if needed. He is at higher risk based on RCRI but METs >6. Pharmacy will have to weigh in on Xarelto hold if needed."  Per pharm note pt can hold Xarelto 3 days prior to procedure.   VS: There were no vitals taken for this visit.  PROVIDERS: Lula Olszewski, MD is PCP   Cardiologist - Lance Muss, MD  LABS: Labs reviewed: Acceptable for surgery. (all labs ordered are listed, but only abnormal results are displayed)  Labs Reviewed - No data to display   IMAGES: CT Angio Chest 01/05/2023 IMPRESSION: 1. No acute intrathoracic, abdominal, or pelvic pathology. No aortic aneurysm or dissection. 2. 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 Recommend follow-up ultrasound every 5 years. This recommendation follows ACR consensus guidelines: White Paper of the ACR Incidental Findings Committee II on Vascular Findings. J Am Coll Radiol 2013; 10:789-794. 3. A 3 mm stone in the distal right ureter close to the ureterovesical junction. No  hydronephrosis. 4. A 7 mm right middle lobe nodule. Non-contrast chest CT at 6-12 months is recommended. If the nodule is stable at time of repeat CT, then future CT at 18-24 months (from today's scan) is considered optional for low-risk patients, but is recommended for high-risk patients. This recommendation follows the consensus statement: Guidelines for Management of Incidental Pulmonary Nodules Detected on CT Images: From the Fleischner Society 2017; Radiology 2017; 284:228-243. 5. Aortic Atherosclerosis (ICD10-I70.0) and Emphysema (ICD10-J43.9).  EKG:   CV: Echo 08/23/2022 1. Left ventricular ejection fraction, by estimation, is 25 to 30%. The  left ventricle has severely decreased function. The left ventricle  demonstrates global hypokinesis. The left ventricular internal cavity size  was mildly dilated. There is mild  concentric left ventricular hypertrophy. Left ventricular diastolic  function could not be evaluated.   2. Right ventricular systolic function is normal. The right ventricular  size is mildly enlarged.   3. Left atrial size was severely dilated.   4. Right atrial size was severely dilated.   5. The mitral valve is normal in structure. Mild mitral valve  regurgitation. No evidence of mitral stenosis.   6. The aortic valve is tricuspid. Aortic valve regurgitation is not  visualized. No aortic stenosis is present.   7. Aortic dilatation noted. There is mild dilatation of the aortic root,  measuring 41 mm.   8. The inferior vena cava is normal in size with greater than 50%  respiratory variability, suggesting right atrial pressure of 3 mmHg.   Cardiac Cath  09/22/2022    Prox RCA lesion is 50% stenosed.  RFR 0.99.  Not significant.   Ost Cx to Prox Cx lesion is 25% stenosed.   1st Diag lesion is 50% stenosed.   Mid LAD lesion is 25% stenosed.   There is moderate to severe left ventricular systolic dysfunction.   LV end diastolic pressure is normal.   The left  ventricular ejection fraction is 30-35% by visual estimate.   There is no aortic valve stenosis.   Aortic saturation 91%, PA saturation 72%, mean RA pressure 4 mmHg, PA pressure 21/12, mean PA pressure 17 mmHg, mean pulmonary capillary wedge pressure 8 mmHg, cardiac output 8.15 L/min, cardiac index 3.18.   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  Normal right heart pressures.  Continue aggressive medical therapy for LV dysfunction, atrial fibrillation and coronary artery disease.  If he does develop significant heart failure symptoms, would have to consider biventricular pacing given his left bundle branch block.   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    BMI 45.0-49.9, adult (HCC) 03/09/2018   CHF (congestive heart failure) (HCC)    Chronic kidney disease    Colon polyps    Complication of anesthesia    Depression    Dyslipidemia    Dysrhythmia 2017   A fib   GERD (gastroesophageal reflux disease)    Glaucoma    early stage   History of 2019 novel coronavirus disease (COVID-19) 06/2019   History of kidney stones    Hypertension    Hypertriglyceridemia 03/14/2022   Lab Results Component Value Date/Time  TRIG 154.0 (H) 03/13/2022 08:53 AM  TRIG 188.0 (H) 06/14/2021 09:38 AM  TRIG 203.0 (H) 06/11/2020 09:38 AM  TRIG 166.0 (H) 06/11/2019 09:44 AM  TRIG 177 (H) 02/18/2019 08:36 AM     OSA on CPAP    C-Pap   Persistent atrial fibrillation (HCC)    Echo 3/18: Mild LVH, EF 55-60, normal wall motion, grade 1 diastolic dysfunction, mild LAE   Personal history of gout 05/23/2019   Pneumonia    childhood   PONV (postoperative nausea and vomiting)    after 1st knee surgery only   Rhinitis, allergic     Past Surgical History:  Procedure Laterality Date   arthroscopic knee surgery Bilateral 2003   CARDIOVERSION N/A 08/10/2016    Procedure: CARDIOVERSION;  Surgeon: Chrystie Nose, MD;  Location: [facility] ENDOSCOPY;  Service: Cardiovascular;  Laterality: N/A;   COLONOSCOPY     CORONARY PRESSURE/FFR STUDY N/A 09/22/2022   Procedure: CORONARY PRESSURE/FFR STUDY;  Surgeon: Corky Crafts, MD;  Location: [facility] INVASIVE CV LAB;  Service: Cardiovascular;  Laterality: N/A;   CYSTOSCOPY WITH RETROGRADE PYELOGRAM, URETEROSCOPY AND STENT PLACEMENT Left 03/04/2021   Procedure: CYSTOSCOPY WITH BILATERAL RETROGRADES,PYELOGRAM, URETEROSCOPY AND STENT PLACEMENT;  Surgeon: Sebastian Ache, MD;  Location: [facility];  Service: Urology;  Laterality: Left;  1 HR   ESOPHAGEAL MANOMETRY N/A 03/22/2022   Procedure: ESOPHAGEAL MANOMETRY (EM);  Surgeon: Imogene Burn, MD;  Location: [facility];  Service: Gastroenterology;  Laterality: N/A;   HOLMIUM LASER APPLICATION Left 03/04/2021   Procedure: HOLMIUM LASER APPLICATION;  Surgeon: Sebastian Ache, MD;  Location: [facility];  Service: Urology;  Laterality: Left;   LAPAROSCOPIC GASTRIC SLEEVE RESECTION N/A 07/15/2019   Procedure: LAPAROSCOPIC GASTRIC SLEEVE RESECTION, Upper Endo, ERAS Pathway;  Surgeon: Luretha Murphy, MD;  Location: [facility];  Service: General;  Laterality: N/A;   REPLACEMENT TOTAL KNEE Bilateral 2008   RIGHT/LEFT HEART CATH AND CORONARY ANGIOGRAPHY N/A 09/22/2022   Procedure: RIGHT/LEFT HEART CATH AND CORONARY ANGIOGRAPHY;  Surgeon: Corky Crafts, MD;  Location: [facility] INVASIVE CV LAB;  Service: Cardiovascular;  Laterality: N/A;   TONSILLECTOMY     TOTAL HIP ARTHROPLASTY Right 10/21/2019   Procedure: RIGHT TOTAL HIP ARTHROPLASTY ANTERIOR APPROACH;  Surgeon: Marcene Corning, MD;  Location: [facility];  Service: Orthopedics;  Laterality: Right;   VASECTOMY      MEDICATIONS:  acetaminophen (TYLENOL) 500 MG tablet   allopurinol (ZYLOPRIM) 300 MG tablet   amoxicillin (AMOXIL) 250 MG capsule   Azelastine HCl 137 MCG/SPRAY SOLN   Calcium Carb-Cholecalciferol (CALCIUM-VITAMIN D) 600-400 MG-UNIT  TABS   colchicine 0.6 MG tablet   diclofenac Sodium (VOLTAREN) 1 % GEL   diphenhydramine-acetaminophen (TYLENOL PM) 25-500 MG TABS tablet   Evolocumab (REPATHA) 140 MG/ML SOSY   fexofenadine (ALLEGRA) 180 MG tablet   Ginkgo 60 MG TABS   latanoprost (XALATAN) 0.005 % ophthalmic solution   methocarbamol (ROBAXIN) 750 MG tablet   methocarbamol (ROBAXIN-750) 750 MG tablet   metoprolol succinate (TOPROL XL) 25 MG 24 hr tablet   mometasone (NASONEX) 50 MCG/ACT nasal spray   morphine (MSIR) 15 MG tablet   Multiple Vitamin (MULTIVITAMIN WITH MINERALS) TABS tablet   Multiple Vitamins-Minerals (AIRBORNE PO)   Omega-3 Fatty Acids (FISH OIL PO)   ondansetron (ZOFRAN-ODT) 4 MG disintegrating tablet   oxyCODONE-acetaminophen (PERCOCET) 5-325 MG tablet   oxyCODONE-acetaminophen (PERCOCET) 5-325 mg TABS tablet   oxyCODONE-acetaminophen (PERCOCET/ROXICET) 5-325 MG tablet   rivaroxaban (XARELTO) 20 MG TABS tablet   rosuvastatin (CRESTOR) 40 MG tablet   sacubitril-valsartan (ENTRESTO) 49-51 MG   Semaglutide-Weight Management (WEGOVY) 1.7 MG/0.75ML SOAJ   sertraline (ZOLOFT) 50 MG tablet   spironolactone (ALDACTONE) 25 MG tablet   tamsulosin (FLOMAX) 0.4 MG CAPS capsule   thiamine (VITAMIN B-1) 100 MG tablet   No current facility-administered medications for this encounter.   Jodell Cipro Ward, PA-C [facility] [phone]

## 2023-01-10 NOTE — Progress Notes (Signed)
[facility]: [phone]   -- Medical Office Visit --  Patient:  Isaiah Jones      Age: 72 y.o.       Sex:  male  Date:   01/10/2023 Patient Care Team: Isaiah Olszewski, MD as PCP - General (Internal Medicine) Isaiah Crafts, MD as PCP - Cardiology (Cardiology) Isaiah Crafts, MD as Consulting Physician (Cardiology) Isaiah Elliot, MD as Consulting Physician (Urology) Isaiah Monks, MD as Referring Physician (Gastroenterology) Isaiah Juniper, MD as Referring Physician (Pulmonary Disease) Isaiah Corning, MD as Consulting Physician (Orthopedic Surgery) Isaiah Jones as Consulting Physician (Dentistry) Isaiah Dice, MD as Attending Physician (Physical Medicine and Rehabilitation) Today's Healthcare Provider: Lula Olszewski, MD      Assessment & Plan Acute upper back pain Upper Back Pain Severe upper back pain, (focus between right shoulder blade and vertebrae, likely from a chipped bone spur as visualized on ER CT reinterpretaation, is noted, with CT scans revealing degenerative changes and bone spurs but no structural abnormalities. We reviewed the images from the ER and this interpretation is my own due to radiologist didn't comment on spine/vertebra in the report.  Despite the severity of the pain, which limits arm movement, morphine and muscle relaxers have been ineffective. We will administer a Decadron injection today to address acute inflammation and prescribe oral Decadron for pickup at the pharmacy. Muscle relaxers and hydrocodone will continue as needed for pain management. Physical therapy will be considered once the inflammation has subsided. Offered opioids but he deferred for now but will check in next week. Kidney stone The patient is scheduled for kidney stone removal soon and has ceased blood thinners in preparation. The current management plan will continue.    Diagnoses and all orders for this visit: Acute upper back pain -      Ambulatory referral to Physical Therapy -     dexamethasone (DECADRON) 6 MG tablet; Take 1 tablet (6 mg total) by mouth daily for 5 days. -     methylPREDNISolone acetate (DEPO-MEDROL) injection 80 mg  Recommended follow-up:  He should schedule a follow-up appointment to evaluate treatment response and determine the necessity of physical therapy.  Future Appointments  Date Time Provider Department Center  01/22/2023  1:40 PM Isaiah Olszewski, MD [facility]  02/07/2023  8:30 AM Isaiah Lemming, MD [facility]  03/27/2023  8:40 AM Isaiah Olszewski, MD [facility]  05/03/2023  8:20 AM Isaiah Jones, Isaiah Ramp, MD [facility] None         Subjective   72 y.o. male who has Atrial fibrillation, persistent; OSA on CPAP; Dyslipidemia; Erectile dysfunction; Essential hypertension; Gout; Osteoarthritis; Seasonal allergies; Depression, major, in remission (HCC) with anxiety; Vitamin D deficiency; S/P laparoscopic sleeve gastrectomy; Primary localized osteoarthritis of right hip; Former smoker; Chronic cough; Dysphagia; Hyperlipidemia; Hypertriglyceridemia; LBBB (left bundle branch block); 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); Anticoagulant long-term use; Achalasia of esophagus; Chronic HFrEF (heart failure with reduced ejection fraction) (HCC); Presbycusis; Laryngopharyngeal reflux (LPR); Insomnia with sleep apnea; History of arthritis; Foot pain, right; Hematuria; Allergic rhinitis; Weight disorder; Hypertension; At risk for falls; CAD (coronary artery disease); Kidney stone; and Chronic kidney disease, stage 2 (mild) on their problem list. His reasons/main concerns/chief complaints for today's office visit are Follow-up Isaiah Jones to ED on 9/6 for acute upper back pain/muscle spasms, aortic ectasia(abdominal) and lund nodule on imaging.)   ------------------------------------------------------------------------------------------------------------------------ AI-Extracted:  Discussed the use of AI scribe software for clinical note  transcription with the patient, who gave verbal consent to proceed.  History of Present Illness   The patient, with a history of kidney stones, presented for a follow-up after a recent hospital visit due to severe upper back pain. The pain, described as a constant throbbing, was so severe that it hindered the patient's ability to raise his arms above his head. The pain was not relieved by morphine or muscle relaxants. The patient also reported a history of gout, but the current pain was described as worse than any gout flare-up he had experienced.  The patient's pain seemed to originate from the right lower back, near the kidney, and gradually spread to encompass the shoulders, neck, and upper right side. The patient had a kidney stone on the right side, but the pain seemed to be unrelated to the stone.  He had been managing the pain with hydrocodone, with about 20 pills remaining at the time of the consultation. The patient was also on a muscle relaxant, taken four times a day, but reported no significant relief from this medication.  The patient had a CT scan at the hospital, which did not reveal any structural abnormalities. However, the patient's pain persisted, leading to the consultation for further investigation and management. The patient was not seeking physical therapy at this time, but was open to trying steroids for potential relief.      He has a past medical history of Anxiety, Arthritis, degenerative, Benign hypertensive heart disease without congestive heart failure, BMI 45.0-49.9, adult (HCC) (03/09/2018), CHF (congestive heart failure) (HCC), Chronic kidney disease, Colon polyps, Complication of anesthesia, Depression, Dyslipidemia, Dysrhythmia (2017), GERD (gastroesophageal reflux disease), Glaucoma, History of 2019 novel coronavirus disease (COVID-19) (06/2019), History of kidney stones, Hypertension, Hypertriglyceridemia  (03/14/2022), OSA on CPAP, Persistent atrial fibrillation (HCC), Personal history of gout (05/23/2019), Pneumonia, PONV (postoperative nausea and vomiting), and Rhinitis, allergic.  Problem list overviews that were updated at today's visit: No problems updated. Current Outpatient Medications on File Prior to Visit  Medication Sig   acetaminophen (TYLENOL) 500 MG tablet Take 500-1,000 mg by mouth every 6 (six) hours as needed for moderate pain.   allopurinol (ZYLOPRIM) 300 MG tablet Take 1 tablet (300 mg total) by mouth daily.   amoxicillin (AMOXIL) 250 MG capsule Take 1,000 mg by mouth See admin instructions. Take 1000 mg 1 hour prior to dental work   Azelastine HCl 137 MCG/SPRAY SOLN Place 2 sprays into both nostrils daily as needed (allergies).   Calcium Carb-Cholecalciferol (CALCIUM-VITAMIN D) 600-400 MG-UNIT TABS Take 1 tablet by mouth 3 (three) times daily.   colchicine 0.6 MG tablet Take 1 tablet (0.6 mg total) by mouth daily. (Patient taking differently: Take 0.6 mg by mouth daily as needed (gout flares).)   diclofenac Sodium (VOLTAREN) 1 % GEL Apply 4 g topically 4 (four) times daily as needed.   diphenhydramine-acetaminophen (TYLENOL PM) 25-500 MG TABS tablet Take 2 tablets by mouth at bedtime.    Evolocumab (REPATHA) 140 MG/ML SOSY Inject 140 mg into the skin every 14 (fourteen) days.   fexofenadine (ALLEGRA) 180 MG tablet Take 180 mg by mouth daily as needed for allergies or rhinitis.   Ginkgo 60 MG TABS Take 60 mg by mouth daily.   latanoprost (XALATAN) 0.005 % ophthalmic solution Place 1 drop into both eyes at bedtime.    methocarbamol (ROBAXIN) 750 MG tablet Take 1 tablet (750 mg total) by mouth every 6 (six) hours as needed for muscle spasms.   methocarbamol (ROBAXIN-750) 750 MG tablet  Take 1 tablet (750 mg total) by mouth 4 (four) times daily.   metoprolol succinate (TOPROL XL) 25 MG 24 hr tablet Take 1 tablet (25 mg total) by mouth daily.   mometasone (NASONEX) 50 MCG/ACT nasal  spray Place 1 spray into the nose at bedtime.   morphine (MSIR) 15 MG tablet Take 15 mg by mouth every 6 (six) hours.   Multiple Vitamin (MULTIVITAMIN WITH MINERALS) TABS tablet Take 1 tablet by mouth 2 (two) times daily. Bariatric Multivitamin   Multiple Vitamins-Minerals (AIRBORNE PO) Take 1 tablet by mouth daily as needed (immune health).    Omega-3 Fatty Acids (FISH OIL PO) Take 1 capsule by mouth daily.   ondansetron (ZOFRAN-ODT) 4 MG disintegrating tablet Take 1 tablet (4 mg total) by mouth every 8 (eight) hours as needed for nausea or vomiting.   oxyCODONE-acetaminophen (PERCOCET) 5-325 MG tablet Take 1 tablet by mouth every 4 (four) hours as needed for severe pain.   oxyCODONE-acetaminophen (PERCOCET) 5-325 mg TABS tablet One tablet every 6 hours   oxyCODONE-acetaminophen (PERCOCET/ROXICET) 5-325 MG tablet Take 1 tablet by mouth every 6 (six) hours as needed for severe pain.   rivaroxaban (XARELTO) 20 MG TABS tablet Take 1 tablet (20 mg total) by mouth daily with supper.   rosuvastatin (CRESTOR) 40 MG tablet Take 1 tablet (40 mg total) by mouth daily.   sacubitril-valsartan (ENTRESTO) 49-51 MG Take 1 tablet by mouth 2 (two) times daily.   Semaglutide-Weight Management (WEGOVY) 1.7 MG/0.75ML SOAJ Inject 1.7 mg into the skin once a week.   sertraline (ZOLOFT) 50 MG tablet Take 1 tablet (50 mg total) by mouth daily.   spironolactone (ALDACTONE) 25 MG tablet Take 0.5 tablets (12.5 mg total) by mouth daily.   tamsulosin (FLOMAX) 0.4 MG CAPS capsule Take 1 capsule (0.4 mg total) by mouth daily.   thiamine (VITAMIN B-1) 100 MG tablet Take 100 mg by mouth daily.   No current facility-administered medications on file prior to visit.  There are no discontinued medications.   Objective   Physical Exam  BP 112/80 (BP Location: Left Arm, Patient Position: Sitting)   Pulse 74   Temp 97.7 F (36.5 C) (Temporal)   Ht 6\' 2"  (1.88 m)   Wt 264 lb 12.8 oz (120.1 kg)   SpO2 95%   BMI 34.00 kg/m   Wt Readings from Last 10 Encounters:  01/10/23 264 lb 12.8 oz (120.1 kg)  12/26/22 275 lb (124.7 kg)  12/22/22 278 lb (126.1 kg)  12/06/22 276 lb (125.2 kg)  10/30/22 288 lb 9.6 oz (130.9 kg)  10/11/22 286 lb 12.8 oz (130.1 kg)  10/02/22 293 lb 3.2 oz (133 kg)  10/02/22 289 lb (131.1 kg)  09/22/22 289 lb (131.1 kg)  09/18/22 294 lb (133.4 kg)   Vital signs reviewed.  Nursing notes reviewed. Weight trend reviewed. Abnormalities and Problem-Specific physical exam findings:  discomfort and stiffness in posture.  General Appearance:  No acute distress appreciable.   Well-groomed, healthy-appearing male.  Well proportioned with no abnormal fat distribution.  Good muscle tone. Pulmonary:  Normal work of breathing at rest, no respiratory distress apparent. SpO2: 95 %  Musculoskeletal: All extremities are intact.  Neurological:  Awake, alert, oriented, and engaged.  No obvious focal neurological deficits or cognitive impairments.  Sensorium seems unclouded.   Speech is clear and coherent with logical content. Psychiatric:  Appropriate mood, pleasant and cooperative demeanor, thoughtful and engaged during the exam  Results   RADIOLOGY CT scan of chest, abdomen, and  pelvis with contrast: No structural abnormalities; no aortic dissection; atherosclerosis with calcifications; possible osteophyte fracture in the spine; no comments on the spine from the radiologist (01/05/2023)        No results found for any visits on 01/10/23.  Admission on 01/05/2023, Discharged on 01/05/2023  Component Date Value   Sodium 01/05/2023 131 (L)    Potassium 01/05/2023 4.2    Chloride 01/05/2023 96 (L)    CO2 01/05/2023 25    Glucose, Bld 01/05/2023 107 (H)    BUN 01/05/2023 13    Creatinine, Ser 01/05/2023 0.87    Calcium 01/05/2023 8.6 (L)    Total Protein 01/05/2023 6.5    Albumin 01/05/2023 3.3 (L)    AST 01/05/2023 13 (L)    ALT 01/05/2023 11    Alkaline Phosphatase 01/05/2023 61    Total Bilirubin  01/05/2023 1.7 (H)    GFR, Estimated 01/05/2023 >60    Anion gap 01/05/2023 10    WBC 01/05/2023 8.0    RBC 01/05/2023 4.32    Hemoglobin 01/05/2023 14.2    HCT 01/05/2023 42.7    MCV 01/05/2023 98.8    MCH 01/05/2023 32.9    MCHC 01/05/2023 33.3    RDW 01/05/2023 14.1    Platelets 01/05/2023 177    nRBC 01/05/2023 0.0    Neutrophils Relative % 01/05/2023 73    Neutro Abs 01/05/2023 5.7    Lymphocytes Relative 01/05/2023 15    Lymphs Abs 01/05/2023 1.2    Monocytes Relative 01/05/2023 12    Monocytes Absolute 01/05/2023 1.0    Eosinophils Relative 01/05/2023 0    Eosinophils Absolute 01/05/2023 0.0    Basophils Relative 01/05/2023 0    Basophils Absolute 01/05/2023 0.0    Immature Granulocytes 01/05/2023 0    Abs Immature Granulocytes 01/05/2023 0.03    Color, Urine 01/05/2023 YELLOW    APPearance 01/05/2023 CLEAR    Specific Gravity, Urine 01/05/2023 1.013    pH 01/05/2023 6.0    Glucose, UA 01/05/2023 NEGATIVE    Hgb urine dipstick 01/05/2023 SMALL (A)    Bilirubin Urine 01/05/2023 NEGATIVE    Ketones, ur 01/05/2023 20 (A)    Protein, ur 01/05/2023 NEGATIVE    Nitrite 01/05/2023 NEGATIVE    Leukocytes,Ua 01/05/2023 NEGATIVE    RBC / HPF 01/05/2023 0-5    WBC, UA 01/05/2023 0-5    Bacteria, UA 01/05/2023 NONE SEEN    Squamous Epithelial / HPF 01/05/2023 0-5    Mucus 01/05/2023 PRESENT   Office Visit on 12/22/2022  Component Date Value   Color, Urine 12/22/2022 YELLOW    APPearance 12/22/2022 CLEAR    Specific Gravity, Urine 12/22/2022 1.020    pH 12/22/2022 6.0    Total Protein, Urine 12/22/2022 NEGATIVE    Urine Glucose 12/22/2022 NEGATIVE    Ketones, ur 12/22/2022 NEGATIVE    Bilirubin Urine 12/22/2022 SMALL (A)    Hgb urine dipstick 12/22/2022 LARGE (A)    Urobilinogen, UA 12/22/2022 0.2    Leukocytes,Ua 12/22/2022 NEGATIVE    Nitrite 12/22/2022 NEGATIVE    WBC, UA 12/22/2022 0-2/hpf    RBC / HPF 12/22/2022 TNTC(>50/hpf) (A)    Squamous Epithelial / HPF  12/22/2022 Rare(0-4/hpf)    Sodium 12/22/2022 140    Potassium 12/22/2022 3.8    Chloride 12/22/2022 103    CO2 12/22/2022 29    Glucose, Bld 12/22/2022 79    BUN 12/22/2022 13    Creatinine, Ser 12/22/2022 0.95    Total Bilirubin 12/22/2022 0.6    Alkaline Phosphatase  12/22/2022 59    AST 12/22/2022 17    ALT 12/22/2022 13    Total Protein 12/22/2022 6.0    Albumin 12/22/2022 3.6    GFR 12/22/2022 79.53    Calcium 12/22/2022 9.4    Uric Acid, Serum 12/22/2022 5.0    Cholesterol 12/22/2022 76    Triglycerides 12/22/2022 97.0    HDL 12/22/2022 38.90 (L)    VLDL 12/22/2022 19.4    LDL Cholesterol 12/22/2022 18    Total CHOL/HDL Ratio 12/22/2022 2    NonHDL 12/22/2022 37.28    Hgb A1c MFr Bld 12/22/2022 5.5    VITD 12/22/2022 32.35    MICRO NUMBER: 12/22/2022 [id number]    SPECIMEN QUALITY: 12/22/2022 Adequate    Sample Source 12/22/2022 URINE    STATUS: 12/22/2022 FINAL    Result: 12/22/2022 No Growth   Admission on 12/06/2022, Discharged on 12/06/2022  Component Date Value   Color, Urine 12/06/2022 AMBER (A)    APPearance 12/06/2022 TURBID (A)    Specific Gravity, Urine 12/06/2022 1.025    pH 12/06/2022 7.0    Glucose, UA 12/06/2022 NEGATIVE    Hgb urine dipstick 12/06/2022 LARGE (A)    Bilirubin Urine 12/06/2022 MODERATE (A)    Ketones, ur 12/06/2022 40 (A)    Protein, ur 12/06/2022 100 (A)    Nitrite 12/06/2022 NEGATIVE    Leukocytes,Ua 12/06/2022 NEGATIVE    Sodium 12/06/2022 137    Potassium 12/06/2022 4.3    Chloride 12/06/2022 101    CO2 12/06/2022 23    Glucose, Bld 12/06/2022 131 (H)    BUN 12/06/2022 13    Creatinine, Ser 12/06/2022 1.23    Calcium 12/06/2022 9.3    GFR, Estimated 12/06/2022 >60    Anion gap 12/06/2022 13    WBC 12/06/2022 9.0    RBC 12/06/2022 5.03    Hemoglobin 12/06/2022 16.8    HCT 12/06/2022 49.7    MCV 12/06/2022 98.8    MCH 12/06/2022 33.4    MCHC 12/06/2022 33.8    RDW 12/06/2022 15.0    Platelets 12/06/2022 212    nRBC  12/06/2022 0.0    RBC / HPF 12/06/2022 >50    WBC, UA 12/06/2022 0-5    Bacteria, UA 12/06/2022 MANY (A)    Squamous Epithelial / HPF 12/06/2022 0-5    Troponin I (High Sensiti* 12/06/2022 7    Troponin I (High Sensiti* 12/06/2022 12    Specimen Description 12/06/2022                     Value:URINE, CLEAN CATCH Performed at [facility], [address], [place], [place] [address]    Special Requests 12/06/2022                     Value:NONE Performed at [facility], [address] [place], [place] [address]    Culture 12/06/2022                     Value:NO GROWTH Performed at [facility], [address], [place], [place] [address]    Report Status 12/06/2022 12/07/2022 FINAL    Troponin I (High Sensiti* 12/06/2022 13   Office Visit on 10/30/2022  Component Date Value   Sodium 10/30/2022 139    Potassium 10/30/2022 5.1    Chloride 10/30/2022 104    CO2 10/30/2022 29    Glucose, Bld 10/30/2022 100 (H)    BUN 10/30/2022 13    Creatinine, Ser 10/30/2022 0.88  GFR 10/30/2022 85.52    Calcium 10/30/2022 9.7    Uric Acid, Serum 10/30/2022 4.1   Admission on 09/22/2022, Discharged on 09/22/2022  Component Date Value   pH, Ven 09/22/2022 7.370    pCO2, Ven 09/22/2022 45.8    pO2, Ven 09/22/2022 39    Bicarbonate 09/22/2022 26.4    TCO2 09/22/2022 28    O2 Saturation 09/22/2022 71    Acid-Base Excess 09/22/2022 1.0    Sodium 09/22/2022 140    Potassium 09/22/2022 3.7    Calcium, Ion 09/22/2022 1.34    HCT 09/22/2022 45.0    Hemoglobin 09/22/2022 15.3    Sample type 09/22/2022 VENOUS    Comment 09/22/2022 NOTIFIED PHYSICIAN    Activated Clotting Time 09/22/2022 239    pH, Arterial 09/22/2022 7.370    pCO2 arterial 09/22/2022 43.7    pO2, Arterial 09/22/2022 63 (L)    Bicarbonate 09/22/2022 25.3    TCO2 09/22/2022 27    O2 Saturation 09/22/2022 91    Acid-Base Excess 09/22/2022 0.0    Sodium 09/22/2022 140    Potassium 09/22/2022 3.7    Calcium, Ion  09/22/2022 1.31    HCT 09/22/2022 46.0    Hemoglobin 09/22/2022 15.6    Sample type 09/22/2022 ARTERIAL    pH, Ven 09/22/2022 7.369    pCO2, Ven 09/22/2022 44.9    pO2, Ven 09/22/2022 40    Bicarbonate 09/22/2022 25.9    TCO2 09/22/2022 27    O2 Saturation 09/22/2022 72    Acid-Base Excess 09/22/2022 0.0    Sodium 09/22/2022 140    Potassium 09/22/2022 3.7    Calcium, Ion 09/22/2022 1.35    HCT 09/22/2022 45.0    Hemoglobin 09/22/2022 15.3    Sample type 09/22/2022 VENOUS   Office Visit on 09/18/2022  Component Date Value   Glucose 09/18/2022 80    BUN 09/18/2022 13    Creatinine, Ser 09/18/2022 0.99    eGFR 09/18/2022 80    BUN/Creatinine Ratio 09/18/2022 13    Sodium 09/18/2022 141    Potassium 09/18/2022 4.3    Chloride 09/18/2022 103    CO2 09/18/2022 23    Calcium 09/18/2022 9.6    WBC 09/18/2022 6.0    RBC 09/18/2022 4.93    Hemoglobin 09/18/2022 16.2    Hematocrit 09/18/2022 48.6    MCV 09/18/2022 99 (H)    MCH 09/18/2022 32.9    MCHC 09/18/2022 33.3    RDW 09/18/2022 12.9    Platelets 09/18/2022 179   Ancillary Procedure on 08/23/2022  Component Date Value   Area-P 1/2 08/23/2022 3.87    S' Lateral 08/23/2022 4.90    Est EF 08/23/2022 25 - 30%   Orders Only on 06/02/2022  Component Date Value   Glucose 06/02/2022 90    BUN 06/02/2022 17    Creatinine, Ser 06/02/2022 0.92    eGFR 06/02/2022 88    BUN/Creatinine Ratio 06/02/2022 18    Sodium 06/02/2022 141    Potassium 06/02/2022 4.4    Chloride 06/02/2022 102    CO2 06/02/2022 23    Calcium 06/02/2022 9.8    specimen status report 06/02/2022 Comment   Ancillary Procedure on 04/17/2022  Component Date Value   S' Lateral 04/17/2022 4.10   Lab on 03/13/2022  Component Date Value   PSA 03/13/2022 0.13    VITD 03/13/2022 37.40    TSH 03/13/2022 1.36    Methylmalonic Acid, Quant 03/13/2022 168    Cholesterol 03/13/2022 148    Triglycerides 03/13/2022 154.0 (H)  HDL 03/13/2022 48.70    VLDL  03/13/2022 30.8    LDL Cholesterol 03/13/2022 68    Total CHOL/HDL Ratio 03/13/2022 3    NonHDL 03/13/2022 98.80    Sodium 03/13/2022 139    Potassium 03/13/2022 4.5    Chloride 03/13/2022 101    CO2 03/13/2022 32    Glucose, Bld 03/13/2022 93    BUN 03/13/2022 15    Creatinine, Ser 03/13/2022 0.92    Total Bilirubin 03/13/2022 0.7    Alkaline Phosphatase 03/13/2022 58    AST 03/13/2022 26    ALT 03/13/2022 21    Total Protein 03/13/2022 6.8    Albumin 03/13/2022 4.4    GFR 03/13/2022 83.10    Calcium 03/13/2022 10.0    WBC 03/13/2022 5.3    RBC 03/13/2022 5.13    Platelets 03/13/2022 174.0    Hemoglobin 03/13/2022 16.7    HCT 03/13/2022 49.6    MCV 03/13/2022 96.7    MCHC 03/13/2022 33.7    RDW 03/13/2022 13.9   There may be more visits with results that are not included.   No image results found.   CT Angio Chest/Abd/Pel for Dissection W and/or Wo Contrast  Result Date: 01/05/2023 CLINICAL DATA:  Back pain radiating to the shoulder. Concern for aortic aneurysm or dissection. EXAM: CT ANGIOGRAPHY CHEST, ABDOMEN AND PELVIS TECHNIQUE: Non-contrast CT of the chest was initially obtained. Multidetector CT imaging through the chest, abdomen and pelvis was performed using the standard protocol during bolus administration of intravenous contrast. Multiplanar reconstructed images and MIPs were obtained and reviewed to evaluate the vascular anatomy. RADIATION DOSE REDUCTION: This exam was performed according to the departmental dose-optimization program which includes automated exposure control, adjustment of the mA and/or kV according to patient size and/or use of iterative reconstruction technique. CONTRAST:  OMNIPAQUE IOHEXOL 350 MG/ML SOLN COMPARISON:  CT abdomen pelvis dated 12/06/2022. FINDINGS: CTA CHEST FINDINGS Cardiovascular: Mild cardiomegaly. No pericardial effusion. There is 3 vessel coronary vascular calcification. There is mild atherosclerotic calcification of the  thoracic aorta. No aneurysmal dilatation or dissection. The origins of the great vessels of the aortic arch and the central pulmonary arteries appear patent. Mediastinum/Nodes: No hilar or mediastinal adenopathy. Small hiatal hernia with slight patulous appearance of the esophagus. Several surgical clips in the distal esophagus similar to prior CT. Retained ingested content noted within the esophagus which can predispose to aspiration. No mediastinal fluid collection. Lungs/Pleura: There are bibasilar subpleural atelectasis. There is paraseptal emphysema. A 7 mm right middle lobe nodule (106/7). No focal consolidation cough pleural effusion, pneumothorax. The central airways are patent. Musculoskeletal: Degenerative changes of the spine. No acute osseous pathology. Review of the MIP images confirms the above findings. CTA ABDOMEN AND PELVIS FINDINGS VASCULAR Aorta: Moderate atherosclerotic calcification of the aorta. No aneurysmal dilatation or dissection. 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 No periaortic fluid collection. Celiac: There is atherosclerotic calcification of the origin of the celiac trunk. The celiac artery and its major branches are patent. SMA: The SMA is patent.  The hepatic artery arises from the SMA. Renals: There is atherosclerotic calcification of the origins of the renal arteries. The renal arteries remain patent. IMA: The IMA is patent. Inflow: Mild atherosclerotic calcification of the iliac arteries. The iliac arteries are patent. No aneurysmal dilatation or dissection. Veins: No obvious venous abnormality within the limitations of this arterial phase study. Review of the MIP images confirms the above findings. NON-VASCULAR No intra-abdominal free air or free fluid. Hepatobiliary: The  liver is unremarkable. No acute radial infection. The gallbladder is distended. No calcified gallstone or pericholecystic fluid. Pancreas: Unremarkable. No pancreatic ductal dilatation or surrounding  inflammatory changes. Spleen: Normal in size without focal abnormality. Adrenals/Urinary Tract: The adrenal glands unremarkable. Bilateral renal cysts measure up to 10 cm in the upper pole of the left kidney. Several additional smaller hypodense lesions are not characterized on this CT due to streak artifact caused by patient's arms. These can be better evaluated with ultrasound on a nonemergent basis. There is no hydronephrosis on either side. There is a 3 mm stone in the distal right ureter close to the ureterovesical junction. The left ureter is unremarkable. The urinary bladder is predominantly collapsed. Stomach/Bowel: There is moderate stool throughout the colon. There is distal colonic diverticulosis without active inflammatory changes. Postsurgical changes of gastric sleeve. No bowel obstruction or active inflammation. The appendix is normal. Lymphatic: No adenopathy. Reproductive: The prostate and seminal vesicles are grossly unremarkable. No pelvic mass. Other: None Musculoskeletal: Osteopenia with degenerative changes. Total right hip arthroplasty with associated streak artifact limiting evaluation of the pelvic structures. Left L5 pars defect no acute osseous pathology. Review of the MIP images confirms the above findings. IMPRESSION: 1. No acute intrathoracic, abdominal, or pelvic pathology. No aortic aneurysm or dissection. 2. 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 Recommend follow-up ultrasound every 5 years. This recommendation follows ACR consensus guidelines: White Paper of the ACR Incidental Findings Committee II on Vascular Findings. J Am Coll Radiol 2013; 10:789-794. 3. A 3 mm stone in the distal right ureter close to the ureterovesical junction. No hydronephrosis. 4. A 7 mm right middle lobe nodule. Non-contrast chest CT at 6-12 months is recommended. If the nodule is stable at time of repeat CT, then future CT at 18-24 months (from today's scan) is considered optional for low-risk patients,  but is recommended for high-risk patients. This recommendation follows the consensus statement: Guidelines for Management of Incidental Pulmonary Nodules Detected on CT Images: From the Fleischner Society 2017; Radiology 2017; 284:228-243. 5. Aortic Atherosclerosis (ICD10-I70.0) and Emphysema (ICD10-J43.9). Electronically Signed   By: Elgie Collard M.D.   On: 01/05/2023 20:19   DG Chest Portable 1 View  Result Date: 12/06/2022 CLINICAL DATA:  Chest and flank pain. EXAM: PORTABLE CHEST 1 VIEW COMPARISON:  Radiograph 10/17/2019, lung bases from abdominopelvic CT earlier today FINDINGS: Borderline cardiomegaly. Clips projecting over the right mediastinum correspond with soft a geode clips. No focal airspace disease, pleural effusion, or pneumothorax. No pulmonary edema. Left shoulder arthropathy. IMPRESSION: Borderline cardiomegaly. No acute chest findings. Electronically Signed   By: Narda Rutherford M.D.   On: 12/06/2022 19:19   CT ABDOMEN PELVIS WO CONTRAST  Result Date: 12/06/2022 CLINICAL DATA:  Right flank pain, vomiting, hematuria EXAM: CT ABDOMEN AND PELVIS WITHOUT CONTRAST TECHNIQUE: Multidetector CT imaging of the abdomen and pelvis was performed following the standard protocol without IV contrast. RADIATION DOSE REDUCTION: This exam was performed according to the departmental dose-optimization program which includes automated exposure control, adjustment of the mA and/or kV according to patient size and/or use of iterative reconstruction technique. COMPARISON:  01/21/2021 FINDINGS: Lower chest: Coronary artery disease and aortic atherosclerosis. No acute abnormality. Hepatobiliary: No focal hepatic abnormality. Gallbladder unremarkable. Pancreas: No focal abnormality or ductal dilatation. Spleen: No focal abnormality.  Normal size. Adrenals/Urinary Tract: Normal adrenal glands. Numerous bilateral renal cysts are stable since prior study and likely reflect benign cysts. No follow-up imaging  recommended. Mild right hydronephrosis and perinephric stranding due to 3  mm mid right ureteral stone. Urinary bladder unremarkable. Stomach/Bowel: Colonic diverticulosis. No active diverticulitis. Normal appendix. Prior gastric sleeve. No bowel obstruction. Vascular/Lymphatic: No evidence of aneurysm or adenopathy. Aortic atherosclerosis. Reproductive: No visible focal abnormality. Other: No free fluid or free air. Musculoskeletal: Prior right hip replacement. No acute bony abnormality. IMPRESSION: 3 mm mid right ureteral stone with mild right hydronephrosis. Colonic diverticulosis.  No active diverticulitis. Aortic atherosclerosis. Electronically Signed   By: Charlett Nose M.D.   On: 12/06/2022 17:21    No results found.     Additional Info: This encounter employed real-time, collaborative documentation. The patient actively reviewed and updated their medical record on a shared screen, ensuring transparency and facilitating joint problem-solving for the problem list, overview, and plan. This approach promotes accurate, informed care. The treatment plan was discussed and reviewed in detail, including medication safety, potential side effects, and all patient questions. We confirmed understanding and comfort with the plan. Follow-up instructions were established, including contacting the office for any concerns, returning if symptoms worsen, persist, or new symptoms develop, and precautions for potential emergency department visits.

## 2023-01-10 NOTE — Patient Instructions (Signed)
VISIT SUMMARY:  During your visit, we discussed your severe upper back pain and your history of kidney stones. We believe your back pain may be due to a chipped bone spur, which is a small, bony projection along the edge of a bone. Your kidney stone removal is scheduled soon.  YOUR PLAN:  -UPPER BACK PAIN: We gave you a solumedrol shot and  Decadron pills to help with the inflammation in your spine and have prescribed oral Decadron for you to pick up at the pharmacy. You should continue taking your muscle relaxers and hydrocodone as needed for pain management. Once the inflammation has subsided, we may consider physical therapy.  -KIDNEY STONE: You have stopped taking blood thinners in preparation for your upcoming kidney stone removal. We will continue with the current management plan for this issue.  INSTRUCTIONS:  Please schedule a follow-up appointment so we can evaluate your response to the treatment and determine if physical therapy is necessary.

## 2023-01-10 NOTE — Anesthesia Preprocedure Evaluation (Addendum)
Anesthesia Evaluation  Patient identified by MRN, date of birth, ID band Patient awake    Reviewed: Allergy & Precautions, NPO status , Patient's Chart, lab work & pertinent test results  History of Anesthesia Complications (+) PONV and history of anesthetic complications (1980s)  Airway Mallampati: III  TM Distance: >3 FB Neck ROM: Full    Dental no notable dental hx. (+) Missing, Partial Upper, Dental Advisory Given,    Pulmonary sleep apnea and Continuous Positive Airway Pressure Ventilation , former smoker   Pulmonary exam normal breath sounds clear to auscultation       Cardiovascular hypertension, + CAD  Normal cardiovascular exam+ dysrhythmias (LBBB) Atrial Fibrillation  Rhythm:Regular Rate:Normal  Echo 08/23/2022 1. Left ventricular ejection fraction, by estimation, is 25 to 30%. The  left ventricle has severely decreased function. The left ventricle  demonstrates global hypokinesis. The left ventricular internal cavity size  was mildly dilated. There is mild  concentric left ventricular hypertrophy. Left ventricular diastolic  function could not be evaluated.   2. Right ventricular systolic function is normal. The right ventricular  size is mildly enlarged.   3. Left atrial size was severely dilated.   4. Right atrial size was severely dilated.   5. The mitral valve is normal in structure. Mild mitral valve  regurgitation. No evidence of mitral stenosis.   6. The aortic valve is tricuspid. Aortic valve regurgitation is not  visualized. No aortic stenosis is present.   7. Aortic dilatation noted. There is mild dilatation of the aortic root,  measuring 41 mm.   8. The inferior vena cava is normal in size with greater than 50%  respiratory variability, suggesting right atrial pressure of 3 mmHg.     Neuro/Psych  PSYCHIATRIC DISORDERS Anxiety Depression       GI/Hepatic ,GERD  Controlled,,  Endo/Other     Renal/GU Renal diseaseLab Results      Component                Value               Date                      NA                       131 (L)             01/05/2023                CL                       96 (L)              01/05/2023                K                        4.2                 01/05/2023                CREATININE               0.87                01/05/2023                   GLUCOSE  107 (H)             01/05/2023             negative genitourinary   Musculoskeletal  (+) Arthritis ,    Abdominal  (+) + obese  Peds  Hematology Lab Results      Component                Value               Date                      WBC                      8.0                 01/05/2023                HGB                      14.2                01/05/2023                HCT                      42.7                01/05/2023                MCV                      98.8                01/05/2023                PLT                      177                 01/05/2023                   Anesthesia Other Findings All: erythromycin  Reproductive/Obstetrics                              Anesthesia Physical Anesthesia Plan  ASA: 4  Anesthesia Plan: General   Post-op Pain Management: Tylenol PO (pre-op)*   Induction: Intravenous  PONV Risk Score and Plan: 4 or greater and Treatment may vary due to age or medical condition, Ondansetron and Midazolam  Airway Management Planned: LMA  Additional Equipment:   Intra-op Plan:   Post-operative Plan:   Informed Consent: I have reviewed the patients History and Physical, chart, labs and discussed the procedure including the risks, benefits and alternatives for the proposed anesthesia with the patient or authorized representative who has indicated his/her understanding and acceptance.     Dental advisory given  Plan Discussed with: CRNA and Anesthesiologist  Anesthesia Plan Comments: (See  PAT note 01/10/23, EF 25-30%, pt referred to EP for ICD consideration.  Cardiology has cleared him for this procedure. )        Anesthesia Quick Evaluation

## 2023-01-12 ENCOUNTER — Ambulatory Visit (HOSPITAL_COMMUNITY)
Admission: RE | Admit: 2023-01-12 | Discharge: 2023-01-12 | Disposition: A | Discharge status: Home / Self Care | Payer: Medicare Other | Attending: Urology | Admitting: Urology

## 2023-01-12 ENCOUNTER — Ambulatory Visit (HOSPITAL_BASED_OUTPATIENT_CLINIC_OR_DEPARTMENT_OTHER): Payer: Medicare Other | Admitting: Anesthesiology

## 2023-01-12 ENCOUNTER — Other Ambulatory Visit: Payer: Self-pay

## 2023-01-12 ENCOUNTER — Encounter (HOSPITAL_COMMUNITY): Payer: Self-pay | Admitting: Urology

## 2023-01-12 ENCOUNTER — Ambulatory Visit (HOSPITAL_COMMUNITY): Payer: Medicare Other

## 2023-01-12 ENCOUNTER — Ambulatory Visit (HOSPITAL_COMMUNITY): Payer: Medicare Other | Admitting: Physician Assistant

## 2023-01-12 ENCOUNTER — Encounter (HOSPITAL_COMMUNITY)
Admission: RE | Disposition: A | Discharge status: Home / Self Care | Payer: Self-pay | Source: Home / Self Care | Attending: Urology

## 2023-01-12 DIAGNOSIS — I2510 Atherosclerotic heart disease of native coronary artery without angina pectoris: Secondary | ICD-10-CM | POA: Insufficient documentation

## 2023-01-12 DIAGNOSIS — I132 Hypertensive heart and chronic kidney disease with heart failure and with stage 5 chronic kidney disease, or end stage renal disease: Secondary | ICD-10-CM

## 2023-01-12 DIAGNOSIS — I10 Essential (primary) hypertension: Secondary | ICD-10-CM | POA: Insufficient documentation

## 2023-01-12 DIAGNOSIS — I5022 Chronic systolic (congestive) heart failure: Secondary | ICD-10-CM

## 2023-01-12 DIAGNOSIS — Z87891 Personal history of nicotine dependence: Secondary | ICD-10-CM

## 2023-01-12 DIAGNOSIS — F32A Depression, unspecified: Secondary | ICD-10-CM | POA: Insufficient documentation

## 2023-01-12 DIAGNOSIS — Z7901 Long term (current) use of anticoagulants: Secondary | ICD-10-CM | POA: Diagnosis not present

## 2023-01-12 DIAGNOSIS — F419 Anxiety disorder, unspecified: Secondary | ICD-10-CM | POA: Diagnosis not present

## 2023-01-12 DIAGNOSIS — N201 Calculus of ureter: Secondary | ICD-10-CM | POA: Diagnosis present

## 2023-01-12 DIAGNOSIS — N132 Hydronephrosis with renal and ureteral calculous obstruction: Secondary | ICD-10-CM

## 2023-01-12 DIAGNOSIS — I4891 Unspecified atrial fibrillation: Secondary | ICD-10-CM | POA: Insufficient documentation

## 2023-01-12 DIAGNOSIS — I447 Left bundle-branch block, unspecified: Secondary | ICD-10-CM | POA: Diagnosis not present

## 2023-01-12 DIAGNOSIS — R310 Gross hematuria: Secondary | ICD-10-CM | POA: Insufficient documentation

## 2023-01-12 DIAGNOSIS — N182 Chronic kidney disease, stage 2 (mild): Secondary | ICD-10-CM | POA: Diagnosis not present

## 2023-01-12 HISTORY — PX: CYSTOSCOPY WITH RETROGRADE PYELOGRAM, URETEROSCOPY AND STENT PLACEMENT: SHX5789

## 2023-01-12 SURGERY — CYSTOURETEROSCOPY, WITH RETROGRADE PYELOGRAM AND STENT INSERTION
Anesthesia: General | Laterality: Right

## 2023-01-12 MED ORDER — IOHEXOL 300 MG/ML  SOLN
INTRAMUSCULAR | Status: DC | PRN
  Administered 2023-01-12: 10 mL

## 2023-01-12 MED ORDER — DEXAMETHASONE SODIUM PHOSPHATE 10 MG/ML IJ SOLN
INTRAMUSCULAR | Status: AC
  Filled 2023-01-12: qty 1

## 2023-01-12 MED ORDER — PHENYLEPHRINE 80 MCG/ML (10ML) SYRINGE FOR IV PUSH (FOR BLOOD PRESSURE SUPPORT)
PREFILLED_SYRINGE | INTRAVENOUS | Status: DC | PRN
  Administered 2023-01-12: 160 ug via INTRAVENOUS
  Administered 2023-01-12: 80 ug via INTRAVENOUS
  Administered 2023-01-12: 160 ug via INTRAVENOUS

## 2023-01-12 MED ORDER — LIDOCAINE 2% (20 MG/ML) 5 ML SYRINGE
INTRAMUSCULAR | Status: DC | PRN
  Administered 2023-01-12: 60 mg via INTRAVENOUS

## 2023-01-12 MED ORDER — CHLORHEXIDINE GLUCONATE 0.12 % MT SOLN
15.0000 mL | Freq: Once | OROMUCOSAL | Status: AC
  Administered 2023-01-12: 15 mL via OROMUCOSAL

## 2023-01-12 MED ORDER — FENTANYL CITRATE (PF) 100 MCG/2ML IJ SOLN
INTRAMUSCULAR | Status: AC
  Filled 2023-01-12: qty 2

## 2023-01-12 MED ORDER — PHENYLEPHRINE 80 MCG/ML (10ML) SYRINGE FOR IV PUSH (FOR BLOOD PRESSURE SUPPORT)
PREFILLED_SYRINGE | INTRAVENOUS | Status: AC
  Filled 2023-01-12: qty 10

## 2023-01-12 MED ORDER — SODIUM CHLORIDE 0.9 % IR SOLN
Status: DC | PRN
  Administered 2023-01-12: 3000 mL via INTRAVESICAL

## 2023-01-12 MED ORDER — FENTANYL CITRATE PF 50 MCG/ML IJ SOSY: 25.0000 ug | PREFILLED_SYRINGE | INTRAMUSCULAR | Status: DC | PRN

## 2023-01-12 MED ORDER — ACETAMINOPHEN 10 MG/ML IV SOLN: 1000.0000 mg | Freq: Once | INTRAVENOUS | Status: DC | PRN

## 2023-01-12 MED ORDER — LACTATED RINGERS IV SOLN: INTRAVENOUS | Status: DC

## 2023-01-12 MED ORDER — PROPOFOL 10 MG/ML IV BOLUS
INTRAVENOUS | Status: DC | PRN
  Administered 2023-01-12: 120 mg via INTRAVENOUS

## 2023-01-12 MED ORDER — ONDANSETRON HCL 4 MG/2ML IJ SOLN
INTRAMUSCULAR | Status: DC | PRN
Start: 2023-01-12 — End: 2023-01-12
  Administered 2023-01-12: 4 mg via INTRAVENOUS

## 2023-01-12 MED ORDER — PROPOFOL 10 MG/ML IV BOLUS
INTRAVENOUS | Status: AC
  Filled 2023-01-12: qty 20

## 2023-01-12 MED ORDER — LIDOCAINE HCL (PF) 2 % IJ SOLN
INTRAMUSCULAR | Status: AC
  Filled 2023-01-12: qty 5

## 2023-01-12 MED ORDER — ONDANSETRON HCL 4 MG/2ML IJ SOLN: 4.0000 mg | Freq: Once | INTRAMUSCULAR | Status: DC | PRN

## 2023-01-12 MED ORDER — DEXAMETHASONE SODIUM PHOSPHATE 10 MG/ML IJ SOLN
INTRAMUSCULAR | Status: DC | PRN
  Administered 2023-01-12: 5 mg via INTRAVENOUS

## 2023-01-12 MED ORDER — ORAL CARE MOUTH RINSE: 15.0000 mL | Freq: Once | OROMUCOSAL | Status: AC

## 2023-01-12 MED ORDER — ONDANSETRON HCL 4 MG/2ML IJ SOLN
INTRAMUSCULAR | Status: AC
  Filled 2023-01-12: qty 2

## 2023-01-12 MED ORDER — FENTANYL CITRATE (PF) 250 MCG/5ML IJ SOLN
INTRAMUSCULAR | Status: DC | PRN
  Administered 2023-01-12: 25 ug via INTRAVENOUS

## 2023-01-12 MED ORDER — CEFAZOLIN SODIUM-DEXTROSE 2-4 GM/100ML-% IV SOLN
2.0000 g | INTRAVENOUS | Status: AC
  Administered 2023-01-12: 2 g via INTRAVENOUS
  Filled 2023-01-12: qty 100

## 2023-01-12 SURGICAL SUPPLY — 25 items
BAG URO CATCHER STRL LF (MISCELLANEOUS) ×1 IMPLANT
BASKET LASER NITINOL 1.9FR (BASKET) IMPLANT
BASKET ZERO TIP NITINOL 2.4FR (BASKET) IMPLANT
BSKT STON RTRVL 120 1.9FR (BASKET)
BSKT STON RTRVL ZERO TP 2.4FR (BASKET) ×1
CATH URETERAL DUAL LUMEN 10F (MISCELLANEOUS) IMPLANT
CATH URETL OPEN END 6FR 70 (CATHETERS) ×1 IMPLANT
CLOTH BEACON ORANGE TIMEOUT ST (SAFETY) ×1 IMPLANT
EXTRACTOR STONE 1.7FRX115CM (UROLOGICAL SUPPLIES) IMPLANT
GLOVE BIO SURGEON STRL SZ7.5 (GLOVE) ×1 IMPLANT
GOWN STRL REUS W/ TWL XL LVL3 (GOWN DISPOSABLE) ×1 IMPLANT
GOWN STRL REUS W/TWL XL LVL3 (GOWN DISPOSABLE) ×1
GUIDEWIRE ANG ZIPWIRE 038X150 (WIRE) IMPLANT
GUIDEWIRE STR DUAL SENSOR (WIRE) ×1 IMPLANT
KIT TURNOVER KIT A (KITS) IMPLANT
LASER FIB FLEXIVA PULSE ID 365 (Laser) IMPLANT
MANIFOLD NEPTUNE II (INSTRUMENTS) ×1 IMPLANT
PACK CYSTO (CUSTOM PROCEDURE TRAY) ×1 IMPLANT
SHEATH NAVIGATOR HD 11/13X28 (SHEATH) IMPLANT
SHEATH NAVIGATOR HD 11/13X36 (SHEATH) IMPLANT
STENT URET 6FRX26 CONTOUR (STENTS) IMPLANT
TRACTIP FLEXIVA PULS ID 200XHI (Laser) IMPLANT
TRACTIP FLEXIVA PULSE ID 200 (Laser)
TUBING CONNECTING 10 (TUBING) ×1 IMPLANT
TUBING UROLOGY SET (TUBING) ×1 IMPLANT

## 2023-01-12 NOTE — Transfer of Care (Signed)
Immediate Anesthesia Transfer of Care Note  Patient: Isaiah Jones  Procedure(s) Performed: CYSTOSCOPY WITH RIGHT RETROGRADE PYELOGRAM, URETEROSCOPY AND STENT PLACEMENT (Right)  Patient Location: PACU  Anesthesia Type:General  Level of Consciousness: awake, oriented, and patient cooperative  Airway & Oxygen Therapy: Patient Spontanous Breathing and Patient connected to face mask oxygen  Post-op Assessment: Report given to RN and Post -op Vital signs reviewed and stable  Post vital signs: Reviewed  Last Vitals:  Vitals Value Taken Time  BP 130/77 01/12/23 1642  Temp    Pulse 80 01/12/23 1645  Resp 15 01/12/23 1645  SpO2 99 % 01/12/23 1645  Vitals shown include unfiled device data.  Last Pain:  Vitals:   01/12/23 1406  TempSrc:   PainSc: 5       Patients Stated Pain Goal: 6 (01/12/23 1406)  Complications: No notable events documented.

## 2023-01-12 NOTE — Op Note (Signed)
Operative Note  Preoperative diagnosis:  1.  Right ureteral calculus  Postoperative diagnosis: 1.  Right ureteral calculus  Procedure(s): 1.  Cystoscopy with right retrograde pyelogram, right ureteroscopy with ureteroscopic stone extraction, right ureteral stent placement  Surgeon: Modena Slater, MD  Assistants: None  Anesthesia: General  Complications: None immediate   EBL: Minimal  Specimens: 1.  Ureteral calculus  Drains/Catheters: 1.  6 x 26 double-J ureteral stent  Intraoperative findings: 1.  Normal anterior urethra 2.  Borderline obstructing prostate 3.  Bladder mucosa without any tumors or masses 4.  Right ureteroscopy confirmed a 3 mm distal ureteral calculus that was basket extracted.  Retrograde pyelogram revealed some minimal hydronephrosis and no filling defect in the kidney  Indication: 72 year old male with a distal right ureteral calculus failed trial of passage.  Description of procedure:  The patient was identified and consent was obtained.  The patient was taken to the operating room and placed in the supine position.  The patient was placed under general anesthesia.  Perioperative antibiotics were administered.  The patient was placed in dorsal lithotomy.  Patient was prepped and draped in a standard sterile fashion and a timeout was performed.  A 21 French rigid cystoscope was advanced into the urethra and into the bladder.  Complete cystoscopy was performed with no abnormal findings.  The right ureter was cannulated with a sensor wire which was advanced up to the kidney under fluoroscopic guidance.  Semirigid ureteroscopy was performed alongside the wire and the stone was basket extracted.  I readvanced the scope alongside the wire and up the ureter to the renal pelvis and no other calculi were seen.  I shot a retrograde pyelogram through the scope with findings noted above.  I withdrew the scope visualizing the ureter upon removal and again there were no other  ureteral calculi.  There was some ureteral edema at the ureterovesicular junction but no ureteral injury.  I backloaded the wire onto rigid cystoscope and advanced that into the bladder followed by routine placement of a 6 x 26 double-J ureteral stent.  Fluoroscopy confirmed proximal placement and direct visualization confirmed a good coil within the bladder.  I drained the bladder and withdrew the scope.  Patient tolerated the procedure well was stable postoperatively.  Plan: Follow-up in 1 week for stent removal

## 2023-01-12 NOTE — H&P (Signed)
CC/HPI: Cc: Gross hematuria.  HPI:  10/24/2018  72 year old male presents with a primary complaint of 1 week of painless gross hematuria. Urinalysis is consistent with persistent hematuria. He is on xarelto for history of atrial fibrillation. On 10/17/2018. Labs revealed a creatinine of 0.7 and a GFR greater than 100. He is unsure of his PSA status. He would like to get that checked today. Given his gross hematuria. He denies previous episodes of gross hematuria. He denies a family history of genitourinary malignancies or stones. No personal history of kidney stones. He smoked cigarettes for 30 years. He quit 12 years ago.   11/08/2018  Patient presents after undergoing a CT IVP. This revealed a small 4 mm distal left ureteral calculus. He also had a simple left cyst. He has had no pain on the left. He did have one episode of staining in the penis Recently that may have been the stone passing. He did not see a stone pass. On CT scan, there was no hydronephrosis.   11/22/18:  This patient has remained asymptomatic, and he denies gross hematuria or dysuria. He denies fever, chills, nausea, or vomiting.   01/21/2021: This 73 year old male who presents today with concerns of gross hematuria. This has happened previously about 2 years ago and was found to be benign. He denies a family history of genitourinary malignancy. He has never passed a stone before. He was a previous cigarette smoker but has not smoked in 14 years. Previous CT imaging was completed in 2022 which did show some left sided stones. He is currently on Xarelto. He does have a history of gout. He is a bicycle rider. His last psa was 0.1 in February of this year.   01/27/21: A 73 year old male who presents for follow-up after diagnosis of a left-sided ureteral stone measuring approximately 4 mm that was not causing obstruction. He reports he has had a few episodes of left-sided abdominal and groin pain that radiated into his testicle. He did have  1 day of gross hematuria after bike riding 13 miles. This is also sided. He believes he may have passed something as he saw something in his strainer.   02/15/2021: Mr. Denio resents today for evaluation and management of a left-sided ureteral stone measuring approximately 4 mm that was not causing obstruction. At last office visit he had persistent microscopic hematuria but had no pain. His CT imaging showed a left-sided distal ureteral stone, this was not initially seen on scan but once reviewed with radiologist the radiologist agreed that this was a distal ureteral stone measuring approximately 4 mm. The patient has not seen a stone pass. He does report increased lower back pain and discomfort that radiates into his testicles. This hit him hard yesterday and got slightly better but then has persisted today. He denies gross hematuria. Denies fevers and chills.   04/15/2021: 72 year old male who presents today for follow-up after he underwent a left-sided ureteroscopy 6 weeks ago. He tolerated the procedure very well. All stone fragments were removed. He successfully removed his ureteral stent. Since his procedure, he denies any flank pain or stone passage. He reports his voiding is back to normal and he is no longer having nocturia or frequency.   12/14/2022  Patient with history nephrolithiasis. Went to the emergency department on the seventh with severe flank pain and hematuria. Also was having severe chest pain. Diagnosed with a 3 mm right ureteral calculus. He has been straining his urine. Has not seen the stone pass. Having  some lower back pain intermittently. Has persistent microscopic hematuria. Pain currently controlled. Denies any fever, chill, nausea, vomiting.   12/28/2022:  Patient presents for 2-week follow-up on MET for 3 mm right ureteral calculus. Since last seen, patient continues with bothersome RLQ and suprapubic pain, nausea. He has not seen any stone material pass. He also endorses  increased frequency. He denies gross hematuria, fever/chills. He believes he maintains his ureteral stone.     ALLERGIES: Ciprofloxacin    MEDICATIONS: Metoprolol Tartrate  Tamsulosin Hcl 0.4 mg capsule 1 capsule PO Daily  Amlodipine Besylate  Amoxicillin  Latanoprost 0.005 % drops  Mometasone Furoate  Rosuvastatin Calcium 20 mg tablet  Sertraline Hcl  Sulfamethoxazole-Trimethoprim  Xarelto 20 mg tablet     GU PSH: Cystoscopy - 2020 Locm 300-399Mg /Ml Iodine,1Ml - 2020 Ureteroscopic laser litho - 03/04/2021     NON-GU PSH: Knee Arthroscopy/surgery, Bilateral     GU PMH: Ureteral calculus (Stable) - 12/14/2022, - 04/15/2021, - 02/15/2021, - 01/27/2021 (Stable), - 01/21/2021, - 2020 Ureteral obstruction secondary to calculous - 12/14/2022 Gross hematuria - 01/27/2021, - 01/21/2021, - 2020 Renal calculus - 2020 Encounter for Prostate Cancer screening - 2020    NON-GU PMH: Arthritis Atrial Fibrillation Glaucoma Gout Hypercholesterolemia Hypertension Sleep Apnea    Immunizations: None   FAMILY HISTORY: 1 Daughter - Other 1 son - Other Myocardial Infarction - Father, Mother   SOCIAL HISTORY: Marital Status: Married Preferred Language: English; Race: White Current Smoking Status: Patient does not smoke anymore. Has not smoked since 09/30/2006.   Tobacco Use Assessment Completed: Used Tobacco in last 30 days? Drinks 2 caffeinated drinks per day.    REVIEW OF SYSTEMS:    GU Review Male:   Patient reports frequent urination. Patient denies hard to postpone urination, burning/ pain with urination, get up at night to urinate, leakage of urine, stream starts and stops, trouble starting your stream, have to strain to urinate , erection problems, and penile pain.  Gastrointestinal (Upper):   Patient reports nausea and vomiting. Patient denies indigestion/ heartburn.  Gastrointestinal (Lower):   Patient denies diarrhea and constipation.  Constitutional:   Patient denies fever,  night sweats, weight loss, and fatigue.  Skin:   Patient denies skin rash/ lesion and itching.  Eyes:   Patient denies blurred vision and double vision.  Ears/ Nose/ Throat:   Patient denies sore throat and sinus problems.  Hematologic/Lymphatic:   Patient denies swollen glands and easy bruising.  Cardiovascular:   Patient denies leg swelling and chest pains.  Respiratory:   Patient denies cough and shortness of breath.  Endocrine:   Patient denies excessive thirst.  Musculoskeletal:   Patient reports back pain. Patient denies joint pain.  Neurological:   Patient denies dizziness and headaches.  Psychologic:   Patient denies depression and anxiety.   VITAL SIGNS:      12/28/2022 02:45 PM  BP 116/68 mmHg  Pulse 43 /min  Temperature 97.7 F / 36.5 C   MULTI-SYSTEM PHYSICAL EXAMINATION:    Constitutional: Well-nourished. No physical deformities. Normally developed. Good grooming.  Respiratory: No labored breathing, no use of accessory muscles.   Cardiovascular: Normal temperature, normal extremity pulses, no swelling, no varicosities.  Skin: No paleness, no jaundice, no cyanosis. No lesion, no ulcer, no rash.  Neurologic / Psychiatric: Oriented to time, oriented to place, oriented to person. No depression, no anxiety, no agitation.  Gastrointestinal: RLQ and suprapubic tenderness to palpation. No frank bilateral CVA tenderness to palpation.     Complexity of Data:  Source Of History:  Patient, Medical Record Summary  Records Review:   Previous Doctor Records  Urine Test Review:   Urinalysis  X-Ray Review: KUB: Reviewed Films. Discussed With Patient.  Renal Ultrasound: Reviewed Films. Reviewed Report. Discussed With Patient.     10/28/18  PSA  Total PSA 0.093 ng/mL    12/28/22  Urinalysis  Urine Appearance Cloudy   Urine Color Amber   Urine Glucose Neg mg/dL  Urine Bilirubin Neg mg/dL  Urine Ketones Neg mg/dL  Urine Specific Gravity 1.025   Urine Blood 3+ ery/uL  Urine pH  5.5   Urine Protein 1+ mg/dL  Urine Urobilinogen 0.2 mg/dL  Urine Nitrites Neg   Urine Leukocyte Esterase Trace leu/uL  Urine WBC/hpf 6 - 10/hpf   Urine RBC/hpf >60/hpf   Urine Epithelial Cells 0 - 5/hpf   Urine Bacteria Few (10-25/hpf)   Urine Mucous Present   Urine Yeast NS (Not Seen)   Urine Trichomonas Not Present   Urine Cystals Ca Oxalate   Urine Casts NS (Not Seen)   Urine Sperm Not Present    PROCEDURES:         KUB - 95284  A single view of the abdomen is obtained. Bilateral renal shadows visualized, without opacifications compatible with renal calculi. The expected anatomical course of bilateral ureters is grossly clear. Pelvic inlet without calcifications compatible with retained ureteral or vesicular calculi.      . Patient confirmed No Neulasta OnPro Device.            Renal Ultrasound - 13244  Right Kidney: Length: 12.6cm Depth: 5.88 cm Cortical Width: 1.44 cm Width: 6.41 cm  Left Kidney: Length: 16.11 cm Depth: cm Cortical Width: 1.48 cm Width: cm  Left Kidney/Ureter:  multiple cysts, largest 10.5x9.9cm.  Right Kidney/Ureter:  Multiple cysts, largest 5.1x4.4cm, calc in upper pole, mild hydro (worse in upper polr of kidney and in renal pelvis)  Bladder:  pvr 35.56ml      . Patient confirmed No Neulasta OnPro Device.           Urinalysis w/Scope Dipstick Dipstick Cont'd Micro  Color: Amber Bilirubin: Neg mg/dL WBC/hpf: 6 - 01/UUV  Appearance: Cloudy Ketones: Neg mg/dL RBC/hpf: >25/DGU  Specific Gravity: 1.025 Blood: 3+ ery/uL Bacteria: Few (10-25/hpf)  pH: 5.5 Protein: 1+ mg/dL Cystals: Ca Oxalate  Glucose: Neg mg/dL Urobilinogen: 0.2 mg/dL Casts: NS (Not Seen)    Nitrites: Neg Trichomonas: Not Present    Leukocyte Esterase: Trace leu/uL Mucous: Present      Epithelial Cells: 0 - 5/hpf      Yeast: NS (Not Seen)      Sperm: Not Present    ASSESSMENT:      ICD-10 Details  1 GU:   RLQ pain - R10.31 Right, Undiagnosed New Problem  2   Suprapubic  pain - R39.82 Undiagnosed New Problem  3   Ureteral calculus - N20.1 Right, Acute, Stable  4   Ureteral obstruction secondary to calculous - N13.2 Right, Acute, Stable   PLAN:           Schedule Return Visit/Planned Activity: Next Available Appointment - Schedule Surgery             Note: Ureteroscopy          Document Letter(s):  Created for Patient: Clinical Summary         Notes:   Today, UA with leukocytosis, >60 RBCs/hpf. Renal ultrasound with persistence of right-sided hydronephrosis. KUB without evidence of right ureteral calculi. However,  patient persists with right-sided stone pain, now with suprapubic involvement as well. He believes he maintains a stone, and has not seen any stone material pass.   We reviewed options and management going forward, with continued MET versus definitive intervention with ESWL versus ureteroscopy. Ultimately, patient would like to proceed with ureteroscopy. We think this is reasonable.   We will proceed with posting surgical posting sheet, and patient will be contacted by the OR scheduler going forward. Patient knows to inform this office of passage of kidney stone, and to present to clinic or ER after hours with worsening presentation. Continue on MET. Patient voiced understanding and is amenable to this plan.   Surgical posting sheet submitted to scheduler.   Signed by Buzzy Han, PA-C on 12/31/22 at 8:45 PM (EDT

## 2023-01-12 NOTE — Discharge Instructions (Signed)

## 2023-01-12 NOTE — Anesthesia Procedure Notes (Signed)
Procedure Name: LMA Insertion Date/Time: 01/12/2023 4:16 PM  Performed by: Lovie Chol, CRNAPre-anesthesia Checklist: Patient identified, Emergency Drugs available, Suction available and Patient being monitored Patient Re-evaluated:Patient Re-evaluated prior to induction Oxygen Delivery Method: Circle System Utilized Preoxygenation: Pre-oxygenation with 100% oxygen Induction Type: IV induction Ventilation: Mask ventilation without difficulty LMA: LMA with gastric port inserted LMA Size: 5.0 Number of attempts: 1 Airway Equipment and Method: Bite block Placement Confirmation: positive ETCO2 and breath sounds checked- equal and bilateral Tube secured with: Tape Dental Injury: Teeth and Oropharynx as per pre-operative assessment

## 2023-01-13 NOTE — Anesthesia Postprocedure Evaluation (Signed)
Anesthesia Post Note  Patient: Isaiah Jones  Procedure(s) Performed: CYSTOSCOPY WITH RIGHT RETROGRADE PYELOGRAM, URETEROSCOPY AND STENT PLACEMENT (Right)     Patient location during evaluation: PACU Anesthesia Type: General Level of consciousness: awake and alert Pain management: pain level controlled Vital Signs Assessment: post-procedure vital signs reviewed and stable Respiratory status: spontaneous breathing, nonlabored ventilation, respiratory function stable and patient connected to nasal cannula oxygen Cardiovascular status: blood pressure returned to baseline and stable Postop Assessment: no apparent nausea or vomiting Anesthetic complications: no   No notable events documented.  Last Vitals:  Vitals:   01/12/23 1715 01/12/23 1727  BP: 111/70 (!) 122/96  Pulse: 100 90  Resp: 19 15  Temp:  36.5 C  SpO2: 96% 99%    Last Pain:  Vitals:   01/12/23 1715  TempSrc:   PainSc: 0-No pain                 Trevor Iha

## 2023-01-14 ENCOUNTER — Encounter (HOSPITAL_COMMUNITY): Payer: Self-pay | Admitting: Urology

## 2023-01-22 ENCOUNTER — Ambulatory Visit: Payer: Medicare Other | Admitting: Internal Medicine

## 2023-01-22 ENCOUNTER — Encounter: Payer: Self-pay | Admitting: Internal Medicine

## 2023-01-22 DIAGNOSIS — R053 Chronic cough: Secondary | ICD-10-CM

## 2023-01-22 DIAGNOSIS — R638 Other symptoms and signs concerning food and fluid intake: Secondary | ICD-10-CM

## 2023-01-22 DIAGNOSIS — R5381 Other malaise: Secondary | ICD-10-CM

## 2023-01-22 DIAGNOSIS — M10072 Idiopathic gout, left ankle and foot: Secondary | ICD-10-CM

## 2023-01-22 DIAGNOSIS — N200 Calculus of kidney: Secondary | ICD-10-CM

## 2023-01-22 DIAGNOSIS — I5022 Chronic systolic (congestive) heart failure: Secondary | ICD-10-CM | POA: Diagnosis not present

## 2023-01-22 DIAGNOSIS — E782 Mixed hyperlipidemia: Secondary | ICD-10-CM

## 2023-01-22 MED ORDER — WEGOVY 2.4 MG/0.75ML ~~LOC~~ SOAJ: 2.4000 mg | SUBCUTANEOUS | 11 refills | Status: DC

## 2023-01-22 MED ORDER — IPRATROPIUM-ALBUTEROL 0.5-2.5 (3) MG/3ML IN SOLN: 3.0000 mL | RESPIRATORY_TRACT | 3 refills | Status: DC | PRN

## 2023-01-22 NOTE — Assessment & Plan Note (Signed)
Kidney Stone Stone removed recently. No current complaints. Patient reports urine is clear. -No immediate action. Follow-up with urologist in November.

## 2023-01-22 NOTE — Patient Instructions (Signed)
VISIT SUMMARY:  During our visit, we discussed your gout, kidney stone, congestive heart failure, high cholesterol, weight management, and chronic cough. You've made significant progress in managing these conditions, particularly with your gout and cholesterol levels. Your weight loss has also been impressive. We've made some adjustments to your treatment plan to continue this positive trend.  YOUR PLAN:  -GOUT: Gout is a type of arthritis that causes painful inflammation in the joints. You've shown significant improvement. Continue taking Allopurinol daily and avoid foods high in purines like shellfish to prevent flare-ups.  -KIDNEY STONE: Kidney stones are hard deposits made of minerals and salts that form inside your kidneys. Your recent stone has been removed and you have no current complaints. You will follow up with your urologist in November.  -CONGESTIVE HEART FAILURE: Congestive heart failure is a chronic condition in which your heart doesn't pump blood as well as it should. You're feeling better and there's no current swelling in your ankles. Continue your current medication regimen and consider cardiac rehab for monitored exercise. We may also consider an implantable defibrillator if your heart function doesn't improve.  -HIGH CHOLESTEROL: High cholesterol can increase your risk of heart disease. Your cholesterol levels have improved significantly. Continue taking Repatha and Rosuvastatin daily.  -WEIGHT MANAGEMENT: Maintaining a healthy weight is important for overall health. You've lost significant weight and wish to continue. We will increase your Wegovy dosage to 2.4mg  weekly.  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 We may consider a nebulizer treatment for chronic bronchitis to help with your cough.  INSTRUCTIONS:  For general health maintenance, continue taking Vitamin D  supplements. We will check your cholesterol and kidney function in November. Consider cardiac rehab for safe exercise and monitor your fluid intake based on ankle swelling. We may also consider a nebulizer for your chronic cough.

## 2023-01-22 NOTE — Assessment & Plan Note (Signed)
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 -Consider nebulizer treatment for chronic bronchitis to help with cough.

## 2023-01-22 NOTE — Progress Notes (Signed)
[facility]: [phone]   -- Medical Office Visit --  Patient:  Isaiah Jones      Age: 72 y.o.       Sex:  male  Date:   01/22/2023 Patient Care Team: Lula Olszewski, MD as PCP - General (Internal Medicine) Corky Crafts, MD as PCP - Cardiology (Cardiology) Corky Crafts, MD as Consulting Physician (Cardiology) Crista Elliot, MD as Consulting Physician (Urology) Diamantina Monks, MD as Referring Physician (Gastroenterology) Ronnette Juniper, MD as Referring Physician (Pulmonary Disease) Marcene Corning, MD as Consulting Physician (Orthopedic Surgery) Santo Held as Consulting Physician (Dentistry) Claria Dice, MD as Attending Physician (Physical Medicine and Rehabilitation) Today's Healthcare Provider: Lula Olszewski, MD      Assessment & Plan Morbid obesity [facility]) Continue Wegovy at 1.7 and increased to 2.4 when ready going great Physical deconditioning He wants to do things but I advised get cleared from cardiology before strenuous However his cardiologist is leaving next month so I went ahead and placed cardiac rehab Chronic systolic congestive heart failure (HCC) Congestive Heart Failure Patient reports feeling better. No current swelling in ankles despite high fluid intake. Discussed potential for improvement with current medication regimen and potential for implantable defibrillator if ejection fraction does not improve. -Continue current medication regimen. -Consider cardiac rehab for monitored exercise. -Consider implantable defibrillator if ejection fraction does not improve over the next few months. Chronic HFrEF (heart failure with reduced ejection fraction) (HCC) Congestive Heart Failure Patient reports feeling better. No current swelling in ankles despite high fluid intake. Discussed potential for improvement with current medication regimen and potential for implantable defibrillator if ejection fraction does not  improve. -Continue current medication regimen. -Consider cardiac rehab for monitored exercise. -Consider implantable defibrillator if ejection fraction does not improve over the next few months. Chronic cough Chronic Cough possibly chronic bronchitis Likely related to achalasia. -Consider nebulizer treatment for chronic bronchitis to help with cough. Idiopathic gout of left foot, unspecified chronicity Gout Patient reports significant improvement in pain. Persistent redness and purpleness around the metatarsophalangeal joint of the big toe, but no pain. Advised to avoid shellfish and high purine foods to prevent flare-ups. -Continue Allopurinol 300mg  daily. Kidney stone Kidney Brent Bulla removed recently. No current complaints. Patient reports urine is clear. -No immediate action. Follow-up with urologist in November. Mixed hyperlipidemia Hyperlipidemia Significant improvement in cholesterol profile with Repatha and increased dose of Rosuvastatin. Total cholesterol of 70 with HDL making up half of total cholesterol. -Continue Repatha and Rosuvastatin 40mg  daily. Weight disorder Weight Management Patient has lost significant weight and wishes to continue weight loss. Currently on Wegovy 1.7mg . -Increase Wegovy to 2.4mg  weekly.  General Health Maintenance -Continue Vitamin D supplementation. -Check cholesterol and kidney function in November. -Consider cardiac rehab for safe exercise. -Continue to monitor fluid intake based on ankle swelling. -Consider nebulizer for chronic cough.       ED Discharge Orders          Ordered    Semaglutide-Weight Management (WEGOVY) 2.4 MG/0.75ML SOAJ  Weekly        01/22/23 1423    Cardiac rehab evaluation        01/22/23 1423    For home use only DME Nebulizer machine        01/22/23 1427    ipratropium-albuterol (DUONEB) 0.5-2.5 (3) MG/3ML SOLN  Every 4 hours PRN        01/22/23 1427          Diagnoses  and all orders for this  visit: Morbid obesity (HCC) -     Semaglutide-Weight Management (WEGOVY) 2.4 MG/0.75ML SOAJ; Inject 2.4 mg into the skin once a week. Physical deconditioning -     Cardiac rehab evaluation; Future Chronic systolic congestive heart failure (HCC) -     Cardiac rehab evaluation; Future Chronic HFrEF (heart failure with reduced ejection fraction) (HCC) -     Cardiac rehab evaluation; Future Chronic cough -     For home use only DME Nebulizer machine Other orders -     ipratropium-albuterol (DUONEB) 0.5-2.5 (3) MG/3ML SOLN; Take 3 mLs by nebulization every 4 (four) hours as needed.  Recommended follow-up: No follow-ups on file. Future Appointments  Date Time Provider Department Center  02/07/2023  8:30 AM [name], MD [facility]  03/27/2023  8:40 AM Lula Olszewski, MD [facility]  05/03/2023  8:20 AM O'Neal, Ronnald Ramp, MD [facility] None            Subjective   72 y.o. male who has Atrial fibrillation, persistent; OSA on CPAP; Dyslipidemia; Erectile dysfunction; Essential hypertension; Gout; Osteoarthritis; Seasonal allergies; Depression, major, in remission (HCC) with anxiety; Vitamin D deficiency; S/P laparoscopic sleeve gastrectomy; Primary localized osteoarthritis of right hip; Former smoker; Chronic cough; Dysphagia; Hyperlipidemia; Hypertriglyceridemia; LBBB (left bundle branch block); 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); Anticoagulant long-term use; Achalasia of esophagus; Chronic HFrEF (heart failure with reduced ejection fraction) (HCC); Presbycusis; Laryngopharyngeal reflux (LPR); Insomnia with sleep apnea; History of arthritis; Foot pain, right; Hematuria; Allergic rhinitis; Weight disorder; Hypertension; At risk for falls; CAD (coronary artery disease); Kidney stone; and Chronic kidney disease, stage 2 (mild) on their problem list. His reasons/main concerns/chief complaints for today's office visit are 2 week follow-up    ------------------------------------------------------------------------------------------------------------------------ AI-Extracted: Discussed the use of AI scribe software for clinical note transcription with the patient, who gave verbal consent to proceed.  History of Present Illness   The patient, with a history of gout, heart failure, kidney disease, and recent kidney stone removal, reports feeling significantly better with minimal pain. He attributes this improvement to his current medication regimen and significant weight loss. The patient has been adhering to a weight loss plan and has lost approximately 45 pounds. He is currently on Repatha and rosuvastatin for cholesterol management, and Wegovy for weight loss. The patient also reports a chronic cough, which he believes may be related to his achalasia.      He has a past medical history of Anxiety, Arthritis, degenerative, Benign hypertensive heart disease without congestive heart failure, BMI 45.0-49.9, adult (HCC) (03/09/2018), CHF (congestive heart failure) (HCC), Chronic kidney disease, Colon polyps, Complication of anesthesia, Depression, Dyslipidemia, Dysrhythmia (2017), GERD (gastroesophageal reflux disease), Glaucoma, History of 2019 novel coronavirus disease (COVID-19) (06/2019), History of kidney stones, Hypertension, Hypertriglyceridemia (03/14/2022), OSA on CPAP, Persistent atrial fibrillation (HCC), Personal history of gout (05/23/2019), Pneumonia, PONV (postoperative nausea and vomiting), and Rhinitis, allergic.  Problem list overviews that were updated at today's visit: No problems updated. Current Outpatient Medications on File Prior to Visit  Medication Sig   acetaminophen (TYLENOL) 500 MG tablet Take 500-1,000 mg by mouth every 6 (six) hours as needed for moderate pain.   allopurinol (ZYLOPRIM) 300 MG tablet Take 1 tablet (300 mg total) by mouth daily.   amoxicillin (AMOXIL) 250 MG capsule Take 1,000 mg by mouth See  admin instructions. Take 1000 mg 1 hour prior to dental work   Azelastine HCl 137 MCG/SPRAY SOLN Place 2 sprays into  both nostrils daily as needed (allergies).   Calcium Carb-Cholecalciferol (CALCIUM-VITAMIN D) 600-400 MG-UNIT TABS Take 1 tablet by mouth 3 (three) times daily.   colchicine 0.6 MG tablet Take 1 tablet (0.6 mg total) by mouth daily. (Patient taking differently: Take 0.6 mg by mouth daily as needed (gout flares).)   diclofenac Sodium (VOLTAREN) 1 % GEL Apply 4 g topically 4 (four) times daily as needed.   diphenhydramine-acetaminophen (TYLENOL PM) 25-500 MG TABS tablet Take 2 tablets by mouth at bedtime.    Evolocumab (REPATHA) 140 MG/ML SOSY Inject 140 mg into the skin every 14 (fourteen) days.   fexofenadine (ALLEGRA) 180 MG tablet Take 180 mg by mouth daily as needed for allergies or rhinitis.   Ginkgo 60 MG TABS Take 60 mg by mouth daily.   latanoprost (XALATAN) 0.005 % ophthalmic solution Place 1 drop into both eyes at bedtime.    methocarbamol (ROBAXIN) 750 MG tablet Take 1 tablet (750 mg total) by mouth every 6 (six) hours as needed for muscle spasms.   methocarbamol (ROBAXIN-750) 750 MG tablet Take 1 tablet (750 mg total) by mouth 4 (four) times daily.   metoprolol succinate (TOPROL XL) 25 MG 24 hr tablet Take 1 tablet (25 mg total) by mouth daily.   mometasone (NASONEX) 50 MCG/ACT nasal spray Place 1 spray into the nose at bedtime.   Multiple Vitamin (MULTIVITAMIN WITH MINERALS) TABS tablet Take 1 tablet by mouth 2 (two) times daily. Bariatric Multivitamin   Multiple Vitamins-Minerals (AIRBORNE PO) Take 1 tablet by mouth daily as needed (immune health).    Omega-3 Fatty Acids (FISH OIL PO) Take 1 capsule by mouth daily.   ondansetron (ZOFRAN-ODT) 4 MG disintegrating tablet Take 1 tablet (4 mg total) by mouth every 8 (eight) hours as needed for nausea or vomiting.   rivaroxaban (XARELTO) 20 MG TABS tablet Take 1 tablet (20 mg total) by mouth daily with supper.   rosuvastatin  (CRESTOR) 40 MG tablet Take 1 tablet (40 mg total) by mouth daily.   sacubitril-valsartan (ENTRESTO) 49-51 MG Take 1 tablet by mouth 2 (two) times daily.   Semaglutide-Weight Management (WEGOVY) 1.7 MG/0.75ML SOAJ Inject 1.7 mg into the skin once a week.   sertraline (ZOLOFT) 50 MG tablet Take 1 tablet (50 mg total) by mouth daily.   spironolactone (ALDACTONE) 25 MG tablet Take 0.5 tablets (12.5 mg total) by mouth daily.   thiamine (VITAMIN B-1) 100 MG tablet Take 100 mg by mouth daily.   tamsulosin (FLOMAX) 0.4 MG CAPS capsule Take 1 capsule (0.4 mg total) by mouth daily. (Patient not taking: Reported on 01/22/2023)   No current facility-administered medications on file prior to visit.   Medications Discontinued During This Encounter  Medication Reason   morphine (MSIR) 15 MG tablet Completed Course   oxyCODONE-acetaminophen (PERCOCET) 5-325 MG tablet Completed Course   oxyCODONE-acetaminophen (PERCOCET) 5-325 mg TABS tablet Completed Course   oxyCODONE-acetaminophen (PERCOCET/ROXICET) 5-325 MG tablet Completed Course     Objective   Physical Exam  BP 110/72 (BP Location: Left Arm, Patient Position: Sitting)   Pulse 88   Temp 97.9 F (36.6 C) (Temporal)   Ht 6\' 2"  (1.88 m)   Wt 264 lb (119.7 kg)   SpO2 97%   BMI 33.90 kg/m  Wt Readings from Last 10 Encounters:  01/22/23 264 lb (119.7 kg)  01/12/23 262 lb (118.8 kg)  01/10/23 264 lb 12.8 oz (120.1 kg)  12/26/22 275 lb (124.7 kg)  12/22/22 278 lb (126.1 kg)  12/06/22 276 lb (  125.2 kg)  10/30/22 288 lb 9.6 oz (130.9 kg)  10/11/22 286 lb 12.8 oz (130.1 kg)  10/02/22 293 lb 3.2 oz (133 kg)  10/02/22 289 lb (131.1 kg)   Vital signs reviewed.  Nursing notes reviewed. Weight trend reviewed. Abnormalities and Problem-Specific physical exam findings:  Physical Exam   EXTREMITIES: Erythema and purplish tint around the metatarsophalangeal joint of the big toe extending to the dorsal foot.    No ankle swelling. Appears well, breathing  well.   General Appearance:  No acute distress appreciable.   Well-groomed, healthy-appearing male.  Well proportioned with no abnormal fat distribution.  Good muscle tone. Pulmonary:  Normal work of breathing at rest, no respiratory distress apparent. SpO2: 97 %  Musculoskeletal: All extremities are intact.  Neurological:  Awake, alert, oriented, and engaged.  No obvious focal neurological deficits or cognitive impairments.  Sensorium seems unclouded.   Speech is clear and coherent with logical content. Psychiatric:  Appropriate mood, pleasant and cooperative demeanor, thoughtful and engaged during the exam  Results   LABS Total cholesterol: 70 HDL: 38  DIAGNOSTIC Ejection fraction: 25-35%        No results found for any visits on 01/22/23.  Admission on 01/05/2023, Discharged on 01/05/2023  Component Date Value   Sodium 01/05/2023 131 (L)    Potassium 01/05/2023 4.2    Chloride 01/05/2023 96 (L)    CO2 01/05/2023 25    Glucose, Bld 01/05/2023 107 (H)    BUN 01/05/2023 13    Creatinine, Ser 01/05/2023 0.87    Calcium 01/05/2023 8.6 (L)    Total Protein 01/05/2023 6.5    Albumin 01/05/2023 3.3 (L)    AST 01/05/2023 13 (L)    ALT 01/05/2023 11    Alkaline Phosphatase 01/05/2023 61    Total Bilirubin 01/05/2023 1.7 (H)    GFR, Estimated 01/05/2023 >60    Anion gap 01/05/2023 10    WBC 01/05/2023 8.0    RBC 01/05/2023 4.32    Hemoglobin 01/05/2023 14.2    HCT 01/05/2023 42.7    MCV 01/05/2023 98.8    MCH 01/05/2023 32.9    MCHC 01/05/2023 33.3    RDW 01/05/2023 14.1    Platelets 01/05/2023 177    nRBC 01/05/2023 0.0    Neutrophils Relative % 01/05/2023 73    Neutro Abs 01/05/2023 5.7    Lymphocytes Relative 01/05/2023 15    Lymphs Abs 01/05/2023 1.2    Monocytes Relative 01/05/2023 12    Monocytes Absolute 01/05/2023 1.0    Eosinophils Relative 01/05/2023 0    Eosinophils Absolute 01/05/2023 0.0    Basophils Relative 01/05/2023 0    Basophils Absolute 01/05/2023  0.0    Immature Granulocytes 01/05/2023 0    Abs Immature Granulocytes 01/05/2023 0.03    Color, Urine 01/05/2023 YELLOW    APPearance 01/05/2023 CLEAR    Specific Gravity, Urine 01/05/2023 1.013    pH 01/05/2023 6.0    Glucose, UA 01/05/2023 NEGATIVE    Hgb urine dipstick 01/05/2023 SMALL (A)    Bilirubin Urine 01/05/2023 NEGATIVE    Ketones, ur 01/05/2023 20 (A)    Protein, ur 01/05/2023 NEGATIVE    Nitrite 01/05/2023 NEGATIVE    Leukocytes,Ua 01/05/2023 NEGATIVE    RBC / HPF 01/05/2023 0-5    WBC, UA 01/05/2023 0-5    Bacteria, UA 01/05/2023 NONE SEEN    Squamous Epithelial / HPF 01/05/2023 0-5    Mucus 01/05/2023 PRESENT   Office Visit on 12/22/2022  Component Date Value  Color, Urine 12/22/2022 YELLOW    APPearance 12/22/2022 CLEAR    Specific Gravity, Urine 12/22/2022 1.020    pH 12/22/2022 6.0    Total Protein, Urine 12/22/2022 NEGATIVE    Urine Glucose 12/22/2022 NEGATIVE    Ketones, ur 12/22/2022 NEGATIVE    Bilirubin Urine 12/22/2022 SMALL (A)    Hgb urine dipstick 12/22/2022 LARGE (A)    Urobilinogen, UA 12/22/2022 0.2    Leukocytes,Ua 12/22/2022 NEGATIVE    Nitrite 12/22/2022 NEGATIVE    WBC, UA 12/22/2022 0-2/hpf    RBC / HPF 12/22/2022 TNTC(>50/hpf) (A)    Squamous Epithelial / HPF 12/22/2022 Rare(0-4/hpf)    Sodium 12/22/2022 140    Potassium 12/22/2022 3.8    Chloride 12/22/2022 103    CO2 12/22/2022 29    Glucose, Bld 12/22/2022 79    BUN 12/22/2022 13    Creatinine, Ser 12/22/2022 0.95    Total Bilirubin 12/22/2022 0.6    Alkaline Phosphatase 12/22/2022 59    AST 12/22/2022 17    ALT 12/22/2022 13    Total Protein 12/22/2022 6.0    Albumin 12/22/2022 3.6    GFR 12/22/2022 79.53    Calcium 12/22/2022 9.4    Uric Acid, Serum 12/22/2022 5.0    Cholesterol 12/22/2022 76    Triglycerides 12/22/2022 97.0    HDL 12/22/2022 38.90 (L)    VLDL 12/22/2022 19.4    LDL Cholesterol 12/22/2022 18    Total CHOL/HDL Ratio 12/22/2022 2    NonHDL 12/22/2022  37.28    Hgb A1c MFr Bld 12/22/2022 5.5    VITD 12/22/2022 32.35    MICRO NUMBER: 12/22/2022 [id number]    SPECIMEN QUALITY: 12/22/2022 Adequate    Sample Source 12/22/2022 URINE    STATUS: 12/22/2022 FINAL    Result: 12/22/2022 No Growth   Admission on 12/06/2022, Discharged on 12/06/2022  Component Date Value   Color, Urine 12/06/2022 AMBER (A)    APPearance 12/06/2022 TURBID (A)    Specific Gravity, Urine 12/06/2022 1.025    pH 12/06/2022 7.0    Glucose, UA 12/06/2022 NEGATIVE    Hgb urine dipstick 12/06/2022 LARGE (A)    Bilirubin Urine 12/06/2022 MODERATE (A)    Ketones, ur 12/06/2022 40 (A)    Protein, ur 12/06/2022 100 (A)    Nitrite 12/06/2022 NEGATIVE    Leukocytes,Ua 12/06/2022 NEGATIVE    Sodium 12/06/2022 137    Potassium 12/06/2022 4.3    Chloride 12/06/2022 101    CO2 12/06/2022 23    Glucose, Bld 12/06/2022 131 (H)    BUN 12/06/2022 13    Creatinine, Ser 12/06/2022 1.23    Calcium 12/06/2022 9.3    GFR, Estimated 12/06/2022 >60    Anion gap 12/06/2022 13    WBC 12/06/2022 9.0    RBC 12/06/2022 5.03    Hemoglobin 12/06/2022 16.8    HCT 12/06/2022 49.7    MCV 12/06/2022 98.8    MCH 12/06/2022 33.4    MCHC 12/06/2022 33.8    RDW 12/06/2022 15.0    Platelets 12/06/2022 212    nRBC 12/06/2022 0.0    RBC / HPF 12/06/2022 >50    WBC, UA 12/06/2022 0-5    Bacteria, UA 12/06/2022 MANY (A)    Squamous Epithelial / HPF 12/06/2022 0-5    Troponin I (High Sensiti* 12/06/2022 7    Troponin I (High Sensiti* 12/06/2022 12    Specimen Description 12/06/2022                     Value:URINE,  CLEAN CATCH Performed at [facility], [address] [place], [place] [address]    Special Requests 12/06/2022                     Value:NONE Performed at [facility], [address] [place], [place] [address]    Culture 12/06/2022                     Value:NO GROWTH Performed at [facility], [address], [place], [place] [address]    Report Status 12/06/2022  12/07/2022 FINAL    Troponin I (High Sensiti* 12/06/2022 13   Office Visit on 10/30/2022  Component Date Value   Sodium 10/30/2022 139    Potassium 10/30/2022 5.1    Chloride 10/30/2022 104    CO2 10/30/2022 29    Glucose, Bld 10/30/2022 100 (H)    BUN 10/30/2022 13    Creatinine, Ser 10/30/2022 0.88    GFR 10/30/2022 85.52    Calcium 10/30/2022 9.7    Uric Acid, Serum 10/30/2022 4.1   Admission on 09/22/2022, Discharged on 09/22/2022  Component Date Value   pH, Ven 09/22/2022 7.370    pCO2, Ven 09/22/2022 45.8    pO2, Ven 09/22/2022 39    Bicarbonate 09/22/2022 26.4    TCO2 09/22/2022 28    O2 Saturation 09/22/2022 71    Acid-Base Excess 09/22/2022 1.0    Sodium 09/22/2022 140    Potassium 09/22/2022 3.7    Calcium, Ion 09/22/2022 1.34    HCT 09/22/2022 45.0    Hemoglobin 09/22/2022 15.3    Sample type 09/22/2022 VENOUS    Comment 09/22/2022 NOTIFIED PHYSICIAN    Activated Clotting Time 09/22/2022 239    pH, Arterial 09/22/2022 7.370    pCO2 arterial 09/22/2022 43.7    pO2, Arterial 09/22/2022 63 (L)    Bicarbonate 09/22/2022 25.3    TCO2 09/22/2022 27    O2 Saturation 09/22/2022 91    Acid-Base Excess 09/22/2022 0.0    Sodium 09/22/2022 140    Potassium 09/22/2022 3.7    Calcium, Ion 09/22/2022 1.31    HCT 09/22/2022 46.0    Hemoglobin 09/22/2022 15.6    Sample type 09/22/2022 ARTERIAL    pH, Ven 09/22/2022 7.369    pCO2, Ven 09/22/2022 44.9    pO2, Ven 09/22/2022 40    Bicarbonate 09/22/2022 25.9    TCO2 09/22/2022 27    O2 Saturation 09/22/2022 72    Acid-Base Excess 09/22/2022 0.0    Sodium 09/22/2022 140    Potassium 09/22/2022 3.7    Calcium, Ion 09/22/2022 1.35    HCT 09/22/2022 45.0    Hemoglobin 09/22/2022 15.3    Sample type 09/22/2022 VENOUS   Office Visit on 09/18/2022  Component Date Value   Glucose 09/18/2022 80    BUN 09/18/2022 13    Creatinine, Ser 09/18/2022 0.99    eGFR 09/18/2022 80    BUN/Creatinine Ratio 09/18/2022 13    Sodium  09/18/2022 141    Potassium 09/18/2022 4.3    Chloride 09/18/2022 103    CO2 09/18/2022 23    Calcium 09/18/2022 9.6    WBC 09/18/2022 6.0    RBC 09/18/2022 4.93    Hemoglobin 09/18/2022 16.2    Hematocrit 09/18/2022 48.6    MCV 09/18/2022 99 (H)    MCH 09/18/2022 32.9    MCHC 09/18/2022 33.3    RDW 09/18/2022 12.9    Platelets 09/18/2022 179   Ancillary Procedure on 08/23/2022  Component  Date Value   Area-P 1/2 08/23/2022 3.87    S' Lateral 08/23/2022 4.90    Est EF 08/23/2022 25 - 30%   Orders Only on 06/02/2022  Component Date Value   Glucose 06/02/2022 90    BUN 06/02/2022 17    Creatinine, Ser 06/02/2022 0.92    eGFR 06/02/2022 88    BUN/Creatinine Ratio 06/02/2022 18    Sodium 06/02/2022 141    Potassium 06/02/2022 4.4    Chloride 06/02/2022 102    CO2 06/02/2022 23    Calcium 06/02/2022 9.8    specimen status report 06/02/2022 Comment   Ancillary Procedure on 04/17/2022  Component Date Value   S' Lateral 04/17/2022 4.10   Lab on 03/13/2022  Component Date Value   PSA 03/13/2022 0.13    VITD 03/13/2022 37.40    TSH 03/13/2022 1.36    Methylmalonic Acid, Quant 03/13/2022 168    Cholesterol 03/13/2022 148    Triglycerides 03/13/2022 154.0 (H)    HDL 03/13/2022 48.70    VLDL 03/13/2022 30.8    LDL Cholesterol 03/13/2022 68    Total CHOL/HDL Ratio 03/13/2022 3    NonHDL 03/13/2022 98.80    Sodium 03/13/2022 139    Potassium 03/13/2022 4.5    Chloride 03/13/2022 101    CO2 03/13/2022 32    Glucose, Bld 03/13/2022 93    BUN 03/13/2022 15    Creatinine, Ser 03/13/2022 0.92    Total Bilirubin 03/13/2022 0.7    Alkaline Phosphatase 03/13/2022 58    AST 03/13/2022 26    ALT 03/13/2022 21    Total Protein 03/13/2022 6.8    Albumin 03/13/2022 4.4    GFR 03/13/2022 83.10    Calcium 03/13/2022 10.0    WBC 03/13/2022 5.3    RBC 03/13/2022 5.13    Platelets 03/13/2022 174.0    Hemoglobin 03/13/2022 16.7    HCT 03/13/2022 49.6    MCV 03/13/2022 96.7    MCHC  03/13/2022 33.7    RDW 03/13/2022 13.9   There may be more visits with results that are not included.   No image results found.   LONG TERM MONITOR (3-14 DAYS)  Result Date: 01/17/2023   Persistent atrial fibrillation with occasional rapid ventricular response. Average HR 79 bpm.   Several pauses > 3 seconds, mostly during sleeping hours.   Frequent PVCs. Short runs of nonsustained ventricular tachycardia.   Continue with plans for EP referral in the setting of LV dysfunction. Patch Wear Time:  13 days and 21 hours (2024-08-26T08:49:29-0400 to 2024-09-09T06:04:29-0400) 71 Ventricular Tachycardia runs occurred, the run with the fastest interval lasting 4 beats with a max rate of 185 bpm, the longest lasting 5 beats with an avg rate of 107 bpm. Atrial Fibrillation occurred continuously (100% burden), ranging from 34-188 bpm (avg of 79 bpm). Bundle Branch Block/IVCD was present. 9 Pauses occurred, the longest lasting 3.3 secs (18 bpm). Isolated VEs were frequent (5.7%, 78029), VE Couplets were occasional (1.4%, 9425), and VE Triplets were rare (<1.0%, 4107). Ventricular Bigeminy and Trigeminy were present.   DG C-Arm 1-60 Min-No Report  Result Date: 01/12/2023 Fluoroscopy was utilized by the requesting physician.  No radiographic interpretation.   CT Angio Chest/Abd/Pel for Dissection W and/or Wo Contrast  Result Date: 01/05/2023 CLINICAL DATA:  Back pain radiating to the shoulder. Concern for aortic aneurysm or dissection. EXAM: CT ANGIOGRAPHY CHEST, ABDOMEN AND PELVIS TECHNIQUE: Non-contrast CT of the chest was initially obtained. Multidetector CT imaging through the chest, abdomen and pelvis was performed  using the standard protocol during bolus administration of intravenous contrast. Multiplanar reconstructed images and MIPs were obtained and reviewed to evaluate the vascular anatomy. RADIATION DOSE REDUCTION: This exam was performed according to the departmental dose-optimization program which  includes automated exposure control, adjustment of the mA and/or kV according to patient size and/or use of iterative reconstruction technique. CONTRAST:  OMNIPAQUE IOHEXOL 350 MG/ML SOLN COMPARISON:  CT abdomen pelvis dated 12/06/2022. FINDINGS: CTA CHEST FINDINGS Cardiovascular: Mild cardiomegaly. No pericardial effusion. There is 3 vessel coronary vascular calcification. There is mild atherosclerotic calcification of the thoracic aorta. No aneurysmal dilatation or dissection. The origins of the great vessels of the aortic arch and the central pulmonary arteries appear patent. Mediastinum/Nodes: No hilar or mediastinal adenopathy. Small hiatal hernia with slight patulous appearance of the esophagus. Several surgical clips in the distal esophagus similar to prior CT. Retained ingested content noted within the esophagus which can predispose to aspiration. No mediastinal fluid collection. Lungs/Pleura: There are bibasilar subpleural atelectasis. There is paraseptal emphysema. A 7 mm right middle lobe nodule (106/7). No focal consolidation cough pleural effusion, pneumothorax. The central airways are patent. Musculoskeletal: Degenerative changes of the spine. No acute osseous pathology. Review of the MIP images confirms the above findings. CTA ABDOMEN AND PELVIS FINDINGS VASCULAR Aorta: Moderate atherosclerotic calcification of the aorta. No aneurysmal dilatation or dissection. 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 No periaortic fluid collection. Celiac: There is atherosclerotic calcification of the origin of the celiac trunk. The celiac artery and its major branches are patent. SMA: The SMA is patent.  The hepatic artery arises from the SMA. Renals: There is atherosclerotic calcification of the origins of the renal arteries. The renal arteries remain patent. IMA: The IMA is patent. Inflow: Mild atherosclerotic calcification of the iliac arteries. The iliac arteries are patent. No aneurysmal dilatation or  dissection. Veins: No obvious venous abnormality within the limitations of this arterial phase study. Review of the MIP images confirms the above findings. NON-VASCULAR No intra-abdominal free air or free fluid. Hepatobiliary: The liver is unremarkable. No acute radial infection. The gallbladder is distended. No calcified gallstone or pericholecystic fluid. Pancreas: Unremarkable. No pancreatic ductal dilatation or surrounding inflammatory changes. Spleen: Normal in size without focal abnormality. Adrenals/Urinary Tract: The adrenal glands unremarkable. Bilateral renal cysts measure up to 10 cm in the upper pole of the left kidney. Several additional smaller hypodense lesions are not characterized on this CT due to streak artifact caused by patient's arms. These can be better evaluated with ultrasound on a nonemergent basis. There is no hydronephrosis on either side. There is a 3 mm stone in the distal right ureter close to the ureterovesical junction. The left ureter is unremarkable. The urinary bladder is predominantly collapsed. Stomach/Bowel: There is moderate stool throughout the colon. There is distal colonic diverticulosis without active inflammatory changes. Postsurgical changes of gastric sleeve. No bowel obstruction or active inflammation. The appendix is normal. Lymphatic: No adenopathy. Reproductive: The prostate and seminal vesicles are grossly unremarkable. No pelvic mass. Other: None Musculoskeletal: Osteopenia with degenerative changes. Total right hip arthroplasty with associated streak artifact limiting evaluation of the pelvic structures. Left L5 pars defect no acute osseous pathology. Review of the MIP images confirms the above findings. IMPRESSION: 1. No acute intrathoracic, abdominal, or pelvic pathology. No aortic aneurysm or dissection. 2. 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 Recommend follow-up ultrasound every 5 years. This recommendation follows ACR consensus guidelines: White Paper of  the ACR Incidental Findings Committee II  on Vascular Findings. J Am Coll Radiol 2013; 10:789-794. 3. A 3 mm stone in the distal right ureter close to the ureterovesical junction. No hydronephrosis. 4. A 7 mm right middle lobe nodule. Non-contrast chest CT at 6-12 months is recommended. If the nodule is stable at time of repeat CT, then future CT at 18-24 months (from today's scan) is considered optional for low-risk patients, but is recommended for high-risk patients. This recommendation follows the consensus statement: Guidelines for Management of Incidental Pulmonary Nodules Detected on CT Images: From the Fleischner Society 2017; Radiology 2017; 284:228-243. 5. Aortic Atherosclerosis (ICD10-I70.0) and Emphysema (ICD10-J43.9). Electronically Signed   By: Elgie Collard M.D.   On: 01/05/2023 20:19   DG Chest Portable 1 View  Result Date: 12/06/2022 CLINICAL DATA:  Chest and flank pain. EXAM: PORTABLE CHEST 1 VIEW COMPARISON:  Radiograph 10/17/2019, lung bases from abdominopelvic CT earlier today FINDINGS: Borderline cardiomegaly. Clips projecting over the right mediastinum correspond with soft a geode clips. No focal airspace disease, pleural effusion, or pneumothorax. No pulmonary edema. Left shoulder arthropathy. IMPRESSION: Borderline cardiomegaly. No acute chest findings. Electronically Signed   By: Narda Rutherford M.D.   On: 12/06/2022 19:19   CT ABDOMEN PELVIS WO CONTRAST  Result Date: 12/06/2022 CLINICAL DATA:  Right flank pain, vomiting, hematuria EXAM: CT ABDOMEN AND PELVIS WITHOUT CONTRAST TECHNIQUE: Multidetector CT imaging of the abdomen and pelvis was performed following the standard protocol without IV contrast. RADIATION DOSE REDUCTION: This exam was performed according to the departmental dose-optimization program which includes automated exposure control, adjustment of the mA and/or kV according to patient size and/or use of iterative reconstruction technique. COMPARISON:  01/21/2021  FINDINGS: Lower chest: Coronary artery disease and aortic atherosclerosis. No acute abnormality. Hepatobiliary: No focal hepatic abnormality. Gallbladder unremarkable. Pancreas: No focal abnormality or ductal dilatation. Spleen: No focal abnormality.  Normal size. Adrenals/Urinary Tract: Normal adrenal glands. Numerous bilateral renal cysts are stable since prior study and likely reflect benign cysts. No follow-up imaging recommended. Mild right hydronephrosis and perinephric stranding due to 3 mm mid right ureteral stone. Urinary bladder unremarkable. Stomach/Bowel: Colonic diverticulosis. No active diverticulitis. Normal appendix. Prior gastric sleeve. No bowel obstruction. Vascular/Lymphatic: No evidence of aneurysm or adenopathy. Aortic atherosclerosis. Reproductive: No visible focal abnormality. Other: No free fluid or free air. Musculoskeletal: Prior right hip replacement. No acute bony abnormality. IMPRESSION: 3 mm mid right ureteral stone with mild right hydronephrosis. Colonic diverticulosis.  No active diverticulitis. Aortic atherosclerosis. Electronically Signed   By: Charlett Nose M.D.   On: 12/06/2022 17:21    DG C-Arm 1-60 Min-No Report  Result Date: 01/12/2023 Fluoroscopy was utilized by the requesting physician.  No radiographic interpretation.       Additional Info: This encounter employed real-time, collaborative documentation. The patient actively reviewed and updated their medical record on a shared screen, ensuring transparency and facilitating joint problem-solving for the problem list, overview, and plan. This approach promotes accurate, informed care. The treatment plan was discussed and reviewed in detail, including medication safety, potential side effects, and all patient questions. We confirmed understanding and comfort with the plan. Follow-up instructions were established, including contacting the office for any concerns, returning if symptoms worsen, persist, or new symptoms  develop, and precautions for potential emergency department visits.

## 2023-01-22 NOTE — Assessment & Plan Note (Signed)
Weight Management Patient has lost significant weight and wishes to continue weight loss. Currently on Wegovy 1.7mg . -Increase Wegovy to 2.4mg  weekly.

## 2023-01-22 NOTE — Assessment & Plan Note (Signed)
Hyperlipidemia Significant improvement in cholesterol profile with Repatha and increased dose of Rosuvastatin. Total cholesterol of 70 with HDL making up half of total cholesterol. -Continue Repatha and Rosuvastatin 40mg  daily.

## 2023-01-22 NOTE — Assessment & Plan Note (Signed)
Gout Patient reports significant improvement in pain. Persistent redness and purpleness around the metatarsophalangeal joint of the big toe, but no pain. Advised to avoid shellfish and high purine foods to prevent flare-ups. -Continue Allopurinol 300mg  daily.

## 2023-01-22 NOTE — Assessment & Plan Note (Signed)
Congestive Heart Failure Patient reports feeling better. No current swelling in ankles despite high fluid intake. Discussed potential for improvement with current medication regimen and potential for implantable defibrillator if ejection fraction does not improve. -Continue current medication regimen. -Consider cardiac rehab for monitored exercise. -Consider implantable defibrillator if ejection fraction does not improve over the next few months.

## 2023-01-24 ENCOUNTER — Telehealth: Payer: Self-pay | Admitting: Internal Medicine

## 2023-01-24 ENCOUNTER — Other Ambulatory Visit: Payer: Self-pay

## 2023-01-24 DIAGNOSIS — J42 Unspecified chronic bronchitis: Secondary | ICD-10-CM

## 2023-01-24 MED ORDER — IPRATROPIUM-ALBUTEROL 0.5-2.5 (3) MG/3ML IN SOLN: 3.0000 mL | RESPIRATORY_TRACT | 3 refills | Status: AC | PRN

## 2023-01-24 NOTE — Telephone Encounter (Signed)
Pharmacy called and states they needs the diagnosis code for insurance for  ipratropium-albuterol (DUONEB) 0.5-2.5 (3) MG/3ML SOLN  Please resend RX with code.

## 2023-01-26 NOTE — Telephone Encounter (Signed)
This was already taken care of on 9/25.

## 2023-01-30 ENCOUNTER — Telehealth: Payer: Self-pay | Admitting: *Deleted

## 2023-02-06 NOTE — Progress Notes (Unsigned)
Electrophysiology Office Note:   Date:  02/07/2023  ID:  Isaiah Jones, DOB Jun 17, 1950, MRN [id number]  Primary Cardiologist: Lance Muss, MD Electrophysiologist: None      History of Present Illness:   Isaiah Jones is a 72 y.o. male with h/o chronic systolic heart failure, atrial fibrillation, hypertension, obesity, sleep apnea seen today for  for Electrophysiology evaluation of heart failure at the request of Everette Rank.    He initially presented to cardiology due to atrial fibrillation.  He was plan for rate control with Xarelto for stroke prevention.  He then presented to cardiology clinic December 2023 with an echo that showed a reduced ejection fraction.  He was started on heart failure therapy.  Repeat echo shows persistently reduced ejection fraction.  He had a left heart catheterization that showed nonobstructive coronary artery disease.  Discussed the use of AI scribe software for clinical note transcription with the patient, who gave verbal consent to proceed.  History of Present Illness   The patient, with a history of AFib since 2018, reports that he is 'always in AFib.' He is unsure when he was last in normal rhythm, but associates episodes of AFib with sweating, particularly in his back. He reports being 'tired' and 'short of breath,' but these symptoms are not new. He has been 'fairly short wind' for some time. He also reports a recent period of inactivity due to a kidney stone and back pain. During this time, he wore a heart monitor, which recorded a heart rate of up to 185 beats per minute. His average heart rate during AFib was 79 beats per minute.  In addition to AFib, the patient has reduced heart function. He is currently on Entresto, spironolactone, and metoprolol for this condition. Despite these medications, his heart function has remained reduced since it was first identified in December. The most recent echocardiogram, performed in April, showed a heart  function of 30-35%.  The patient is generally active and enjoys activities such as running a chainsaw, using a weed eater, and riding a bicycle. He expresses a desire to continue these activities and not be a 'burden' to his partner.      Review of systems complete and found to be negative unless listed in HPI.   EP Information / Studies Reviewed:    EKG is not ordered today. EKG from 12/06/22 reviewed which showed atrial fibrillation        Risk Assessment/Calculations:    CHA2DS2-VASc Score = 4   This indicates a 4.8% annual risk of stroke. The patient's score is based upon: CHF History: 1 HTN History: 1 Diabetes History: 0 Stroke History: 0 Vascular Disease History: 1 Age Score: 1 Gender Score: 0             Physical Exam:   VS:  BP 112/68   Pulse (!) 56   Ht 6\' 2"  (1.88 m)   Wt 267 lb 6.4 oz (121.3 kg)   SpO2 99%   BMI 34.33 kg/m    Wt Readings from Last 3 Encounters:  02/07/23 267 lb 6.4 oz (121.3 kg)  01/22/23 264 lb (119.7 kg)  01/12/23 262 lb (118.8 kg)     GEN: Well nourished, well developed in no acute distress NECK: No JVD; No carotid bruits CARDIAC: Irregularly irregular rate and rhythm, no murmurs, rubs, gallops RESPIRATORY:  Clear to auscultation without rales, wheezing or rhonchi  ABDOMEN: Soft, non-tender, non-distended EXTREMITIES:  No edema; No deformity   ASSESSMENT AND PLAN:  1.  Chronic systolic heart failure: Due to nonischemic cardiomyopathy.  Currently on optimal medical therapy per primary cardiology.  Based on his persistently low ejection fraction, he would benefit from ICD therapy.  Risks and benefits have been discussed.  Risk include bleeding, tamponade, infection, pneumothorax, renal failure, MI, death.  He understands these risks and is agreed to the procedure.  He had an echo 6 months ago.  Bernarr Longsworth plan for repeat echo and if ejection fraction remains low, Briani Maul plan for ICD implant.  2.  Longstanding persistent atrial  fibrillation: Well rate controlled on metoprolol  3.  Secondary hypercoagulable state: Currently on Xarelto for atrial fibrillation  4.  Hypertension: Currently well-controlled  5.  Obstructive sleep apnea: CPAP compliance encouraged  6.  Morbid obesity: Lifestyle modification encouraged.  On Wegovy.  Follow up with Dr. Elberta Fortis as usual post procedure  Signed, Olesya Wike Jorja Loa, MD

## 2023-02-07 ENCOUNTER — Ambulatory Visit: Payer: Medicare Other | Attending: Cardiology | Admitting: Cardiology

## 2023-02-07 ENCOUNTER — Encounter: Payer: Self-pay | Admitting: Cardiology

## 2023-02-07 VITALS — BP 112/68 | HR 56 | Ht 74.0 in | Wt 267.4 lb

## 2023-02-07 DIAGNOSIS — I5022 Chronic systolic (congestive) heart failure: Secondary | ICD-10-CM | POA: Diagnosis not present

## 2023-02-07 DIAGNOSIS — I428 Other cardiomyopathies: Secondary | ICD-10-CM

## 2023-02-07 NOTE — Patient Instructions (Signed)
Medication Instructions:  Your physician recommends that you continue on your current medications as directed. Please refer to the Current Medication list given to you today.  *If you need a refill on your cardiac medications before your next appointment, please call your pharmacy*   Lab Work: None ordered   Testing/Procedures: Your physician has requested that you have an echocardiogram. Echocardiography is a painless test that uses sound waves to create images of your heart. It provides your doctor with information about the size and shape of your heart and how well your heart's chambers and valves are working. This procedure takes approximately one hour. There are no restrictions for this procedure. Please do NOT wear cologne, perfume, aftershave, or lotions (deodorant is allowed). Please arrive 15 minutes prior to your appointment time.   Follow-Up: At [facility], you and your health needs are our priority.  As part of our continuing mission to provide you with exceptional heart care, we have created designated Provider Care Teams.  These Care Teams include your primary Cardiologist (physician) and Advanced Practice Providers (APPs -  Physician Assistants and Nurse Practitioners) who all work together to provide you with the care you need, when you need it.   Your next appointment:   To be  determined after echocardiogram  The format for your next appointment:   In Person  Provider:   Loman Brooklyn, MD    Thank you for choosing [facility]!!   Dory Horn, RN [phone]

## 2023-02-08 ENCOUNTER — Encounter (HOSPITAL_COMMUNITY): Payer: Self-pay | Admitting: *Deleted

## 2023-02-22 ENCOUNTER — Ambulatory Visit (HOSPITAL_COMMUNITY): Payer: Medicare Other | Attending: Cardiovascular Disease

## 2023-02-22 DIAGNOSIS — I428 Other cardiomyopathies: Secondary | ICD-10-CM | POA: Diagnosis present

## 2023-02-22 DIAGNOSIS — I5022 Chronic systolic (congestive) heart failure: Secondary | ICD-10-CM | POA: Diagnosis present

## 2023-02-22 LAB — ECHOCARDIOGRAM COMPLETE
Area-P 1/2: 3.38 cm2
S' Lateral: 5.1 cm

## 2023-02-27 ENCOUNTER — Encounter: Payer: Self-pay | Admitting: Internal Medicine

## 2023-02-27 DIAGNOSIS — I5189 Other ill-defined heart diseases: Secondary | ICD-10-CM | POA: Insufficient documentation

## 2023-03-01 ENCOUNTER — Telehealth: Payer: Self-pay | Admitting: Cardiology

## 2023-03-01 DIAGNOSIS — I428 Other cardiomyopathies: Secondary | ICD-10-CM

## 2023-03-01 NOTE — Telephone Encounter (Signed)
Pt is requesting a callback regarding his ECHO results. He'd like to be called on his cell [phone]. Please advise

## 2023-03-01 NOTE — Telephone Encounter (Signed)
Pt aware he needs ICD implant. Aware EP procedure scheduler, April, will call him soon to arrange this year.  He prefers asap. Aware it will be a late afternoon implant. Agreeable to plan.

## 2023-03-05 NOTE — Telephone Encounter (Signed)
Pt is scheduled for ICD Implant with Dr. Elberta Fortis on 11/15 at 4:30 PM. He is aware that he may be called on that day to come in sooner.  He will come to [facility] office to have labs done on 11/6 and will get a copy of his Instruction letter and his scrub at that time.   He is aware to hold Xarelto x 2 days He is aware that his last dose of Wegovy must be on or before 11/7.

## 2023-03-07 ENCOUNTER — Other Ambulatory Visit (HOSPITAL_COMMUNITY): Payer: Self-pay | Admitting: Cardiology

## 2023-03-08 LAB — BASIC METABOLIC PANEL
BUN/Creatinine Ratio: 11 (ref 10–24)
BUN: 9 mg/dL (ref 8–27)
CO2: 28 mmol/L (ref 20–29)
Calcium: 9.6 mg/dL (ref 8.6–10.2)
Chloride: 105 mmol/L (ref 96–106)
Creatinine, Ser: 0.79 mg/dL (ref 0.76–1.27)
Glucose: 80 mg/dL (ref 70–99)
Potassium: 4.5 mmol/L (ref 3.5–5.2)
Sodium: 142 mmol/L (ref 134–144)
eGFR: 94 mL/min/{1.73_m2} (ref >59)

## 2023-03-08 LAB — CBC
Hematocrit: 47.6 % (ref 37.5–51.0)
Hemoglobin: 15.2 g/dL (ref 13.0–17.7)
MCH: 33 pg (ref 26.6–33.0)
MCHC: 31.9 g/dL (ref 31.5–35.7)
MCV: 103 fL — ABNORMAL HIGH (ref 79–97)
Platelets: 184 10*3/uL (ref 150–450)
RBC: 4.61 x10E6/uL (ref 4.14–5.80)
RDW: 13.7 % (ref 11.6–15.4)
WBC: 5.1 10*3/uL (ref 3.4–10.8)

## 2023-03-13 ENCOUNTER — Telehealth: Payer: Self-pay

## 2023-03-13 NOTE — Telephone Encounter (Signed)
Pt called to discuss his medication prior to his procedure this Friday 11/15. I went over his meds with him and he understands. He had no further questions.

## 2023-03-15 NOTE — Pre-Procedure Instructions (Signed)
Instructed patient on the following items: Arrival time 2:00 Nothing to eat or drink after midnight No meds AM of procedure Responsible person to drive you home and stay with you for 24 hrs Wash with special soap night before and morning of procedure If on anti-coagulant drug instructions Xarelto- last dose 11/12

## 2023-03-16 ENCOUNTER — Ambulatory Visit (HOSPITAL_COMMUNITY)
Admission: RE | Admit: 2023-03-16 | Discharge: 2023-03-16 | Disposition: A | Discharge status: Home / Self Care | Payer: Medicare Other | Attending: Cardiology | Admitting: Cardiology

## 2023-03-16 ENCOUNTER — Encounter (HOSPITAL_COMMUNITY): Payer: Self-pay | Admitting: Cardiology

## 2023-03-16 ENCOUNTER — Other Ambulatory Visit: Payer: Self-pay

## 2023-03-16 ENCOUNTER — Ambulatory Visit (HOSPITAL_COMMUNITY): Payer: Medicare Other

## 2023-03-16 ENCOUNTER — Ambulatory Visit (HOSPITAL_COMMUNITY)
Admission: RE | Disposition: A | Discharge status: Home / Self Care | Payer: Self-pay | Source: Home / Self Care | Attending: Cardiology

## 2023-03-16 DIAGNOSIS — I5022 Chronic systolic (congestive) heart failure: Secondary | ICD-10-CM | POA: Diagnosis not present

## 2023-03-16 DIAGNOSIS — E669 Obesity, unspecified: Secondary | ICD-10-CM | POA: Diagnosis not present

## 2023-03-16 DIAGNOSIS — I110 Hypertensive heart disease with heart failure: Secondary | ICD-10-CM | POA: Insufficient documentation

## 2023-03-16 DIAGNOSIS — I4891 Unspecified atrial fibrillation: Secondary | ICD-10-CM | POA: Insufficient documentation

## 2023-03-16 DIAGNOSIS — I428 Other cardiomyopathies: Secondary | ICD-10-CM | POA: Diagnosis not present

## 2023-03-16 DIAGNOSIS — I447 Left bundle-branch block, unspecified: Secondary | ICD-10-CM | POA: Insufficient documentation

## 2023-03-16 DIAGNOSIS — Z6833 Body mass index (BMI) 33.0-33.9, adult: Secondary | ICD-10-CM | POA: Insufficient documentation

## 2023-03-16 DIAGNOSIS — G4730 Sleep apnea, unspecified: Secondary | ICD-10-CM | POA: Insufficient documentation

## 2023-03-16 DIAGNOSIS — I429 Cardiomyopathy, unspecified: Secondary | ICD-10-CM | POA: Diagnosis not present

## 2023-03-16 HISTORY — PX: IMPLANTABLE CARDIOVERTER DEFIBRILLATOR GENERATOR CHANGE: SHX5859

## 2023-03-16 HISTORY — PX: BIV ICD INSERTION CRT-D: EP1195

## 2023-03-16 SURGERY — BIV ICD INSERTION CRT-D
Anesthesia: LOCAL

## 2023-03-16 MED ORDER — LIDOCAINE-EPINEPHRINE 1 %-1:100000 IJ SOLN
INTRAMUSCULAR | Status: AC
  Filled 2023-03-16: qty 1

## 2023-03-16 MED ORDER — CEFAZOLIN IN SODIUM CHLORIDE 3-0.9 GM/100ML-% IV SOLN
3.0000 g | INTRAVENOUS | Status: AC
  Administered 2023-03-16: 3 g via INTRAVENOUS
  Filled 2023-03-16: qty 100

## 2023-03-16 MED ORDER — MIDAZOLAM HCL 5 MG/5ML IJ SOLN
INTRAMUSCULAR | Status: DC | PRN
  Administered 2023-03-16 (×2): 1 mg via INTRAVENOUS

## 2023-03-16 MED ORDER — IOHEXOL 350 MG/ML SOLN
INTRAVENOUS | Status: DC | PRN
  Administered 2023-03-16: 5 mL

## 2023-03-16 MED ORDER — LIDOCAINE HCL (PF) 1 % IJ SOLN
INTRAMUSCULAR | Status: AC
  Filled 2023-03-16: qty 30

## 2023-03-16 MED ORDER — FENTANYL CITRATE (PF) 100 MCG/2ML IJ SOLN
INTRAMUSCULAR | Status: DC | PRN
  Administered 2023-03-16 (×2): 25 ug via INTRAVENOUS

## 2023-03-16 MED ORDER — SODIUM CHLORIDE 0.9 % IV SOLN: INTRAVENOUS | Status: DC

## 2023-03-16 MED ORDER — METOPROLOL SUCCINATE ER 50 MG PO TB24: 50.0000 mg | ORAL_TABLET | Freq: Every day | ORAL | Status: DC

## 2023-03-16 MED ORDER — ONDANSETRON HCL 4 MG/2ML IJ SOLN: 4.0000 mg | Freq: Four times a day (QID) | INTRAMUSCULAR | Status: DC | PRN

## 2023-03-16 MED ORDER — POVIDONE-IODINE 10 % EX SWAB
2.0000 | Freq: Once | CUTANEOUS | Status: AC
  Administered 2023-03-16: 2 via TOPICAL

## 2023-03-16 MED ORDER — FENTANYL CITRATE (PF) 100 MCG/2ML IJ SOLN
INTRAMUSCULAR | Status: AC
  Filled 2023-03-16: qty 2

## 2023-03-16 MED ORDER — MIDAZOLAM HCL 2 MG/2ML IJ SOLN
INTRAMUSCULAR | Status: AC
  Filled 2023-03-16: qty 2

## 2023-03-16 MED ORDER — CEFAZOLIN SODIUM-DEXTROSE 1-4 GM/50ML-% IV SOLN: 1.0000 g | Freq: Four times a day (QID) | INTRAVENOUS | Status: DC

## 2023-03-16 MED ORDER — ACETAMINOPHEN 325 MG PO TABS: 325.0000 mg | ORAL_TABLET | ORAL | Status: DC | PRN

## 2023-03-16 MED ORDER — SODIUM CHLORIDE 0.9 % IV SOLN
80.0000 mg | INTRAVENOUS | Status: AC
  Administered 2023-03-16: 80 mg

## 2023-03-16 MED ORDER — LIDOCAINE HCL (PF) 1 % IJ SOLN
INTRAMUSCULAR | Status: DC | PRN
  Administered 2023-03-16: 60 mL

## 2023-03-16 MED ORDER — HEPARIN (PORCINE) IN NACL 1000-0.9 UT/500ML-% IV SOLN
INTRAVENOUS | Status: DC | PRN
  Administered 2023-03-16: 500 mL

## 2023-03-16 MED ORDER — CHLORHEXIDINE GLUCONATE 4 % EX SOLN
4.0000 | Freq: Once | CUTANEOUS | Status: DC
  Filled 2023-03-16: qty 60

## 2023-03-16 MED ORDER — LIDOCAINE-EPINEPHRINE 1 %-1:100000 IJ SOLN
INTRAMUSCULAR | Status: AC
  Filled 2023-03-16: qty 3

## 2023-03-16 MED ORDER — SODIUM CHLORIDE 0.9 % IV SOLN
INTRAVENOUS | Status: AC
  Filled 2023-03-16: qty 2

## 2023-03-16 SURGICAL SUPPLY — 18 items
BALLN COR SINUS VENO 6FR 80 (BALLOONS) ×1
BALLOON COR SINUS VENO 6FR 80 (BALLOONS) IMPLANT
CABLE SURGICAL S-101-97-12 (CABLE) ×1 IMPLANT
CATH CPS DIRECT 135 DS2C020 (CATHETERS) IMPLANT
ICD GALLANT HFCRTD CDHFA500Q (ICD Generator) IMPLANT
LEAD DURATA 7122Q-65CM (Lead) IMPLANT
LEAD QUARTET 1456Q-86 (Lead) IMPLANT
LEAD ULTIPACE 52 LPA1231/52 (Lead) IMPLANT
PAD DEFIB RADIO PHYSIO CONN (PAD) ×1 IMPLANT
QUARTET 1456Q-86 (Lead) ×1 IMPLANT
SHEATH 7FR PRELUDE SNAP 13 (SHEATH) IMPLANT
SHEATH 9.5FR PRELUDE SNAP 13 (SHEATH) IMPLANT
SHEATH PROBE COVER 6X72 (BAG) IMPLANT
SLITTER AGILIS HISPRO (INSTRUMENTS) IMPLANT
TRAY PACEMAKER INSERTION (PACKS) ×1 IMPLANT
WIRE ACUITY WHISPER EDS 4648 (WIRE) IMPLANT
WIRE HI TORQ VERSACORE-J 145CM (WIRE) IMPLANT
WIRE MAILMAN 300CM (WIRE) IMPLANT

## 2023-03-16 NOTE — Discharge Instructions (Signed)
After Your ICD (Implantable Cardiac Defibrillator)   You have a [employer] ICD  ACTIVITY Do not lift your arm above shoulder height for 1 week after your procedure. After 7 days, you may progress as below.  You should remove your sling 24 hours after your procedure, unless otherwise instructed by your provider.     Friday March 23, 2023  Saturday March 24, 2023 Sunday March 25, 2023 Monday March 26, 2023   Do not lift, push, pull, or carry anything over 10 pounds with the affected arm until 6 weeks (Friday April 27, 2023 ) after your procedure.   You may drive AFTER your wound check, unless you have been told otherwise by your provider.   Ask your healthcare provider when you can go back to work   INCISION/Dressing If you are on a blood thinner such as Coumadin, Xarelto, Eliquis, Plavix, or Pradaxa please confirm with your provider when this should be resumed.   If large square, outer bandage is left in place, this can be removed after 24 hours from your procedure. Do not remove steri-strips or glue as below.   Monitor your defibrillator site for redness, swelling, and drainage. Call the device clinic at [phone] if you experience these symptoms or fever/chills.  If your incision is sealed with Steri-strips or staples, you may shower 7 days after your procedure or when told by your provider. Do not remove the steri-strips or let the shower hit directly on your site. You may wash around your site with soap and water.    If you were discharged in a sling, please do not wear this during the day more than 48 hours after your surgery unless otherwise instructed. This may increase the risk of stiffness and soreness in your shoulder.   Avoid lotions, ointments, or perfumes over your incision until it is well-healed.  You may use a hot tub or a pool AFTER your wound check appointment if the incision is completely closed.  Your ICD is designed to protect you from life  threatening heart rhythms. Because of this, you may receive a shock.   1 shock with no symptoms:  Call the office during business hours. 1 shock with symptoms (chest pain, chest pressure, dizziness, lightheadedness, shortness of breath, overall feeling unwell):  Call 911. If you experience 2 or more shocks in 24 hours:  Call 911. If you receive a shock, you should not drive for 6 months per the [place] DMV IF you receive appropriate therapy from your ICD.   ICD Alerts:  Some alerts are vibratory and others beep. These are NOT emergencies. Please call our office to let us know. If this occurs at night or on weekends, it can wait until the next business day. Send a remote transmission.  If your device is capable of reading fluid status (for heart failure), you will be offered monthly monitoring to review this with you.   DEVICE MANAGEMENT Remote monitoring is used to monitor your ICD from home. This monitoring is scheduled every 91 days by our office. It allows [place] to keep an eye on the functioning of your device to ensure it is working properly. You will routinely see your Electrophysiologist annually (more often if necessary).   You should receive your ID card for your new device in 4-8 weeks. Keep this card with you at all times once received. Consider wearing a medical alert bracelet or necklace.  Your ICD  may be MRI compatible. This will be discussed at your next  office visit/wound check.  You should avoid contact with strong electric or magnetic fields.   Do not use amateur (ham) radio equipment or electric (arc) welding torches. MP3 player headphones with magnets should not be used. Some devices are safe to use if held at least 12 inches (30 cm) from your defibrillator. These include power tools, lawn mowers, and speakers. If you are unsure if something is safe to use, ask your health care provider.  When using your cell phone, hold it to the ear that is on the opposite side from the  defibrillator. Do not leave your cell phone in a pocket over the defibrillator.  You may safely use electric blankets, heating pads, computers, and microwave ovens.  Call the office right away if: You have chest pain. You feel more than one shock. You feel more short of breath than you have felt before. You feel more light-headed than you have felt before. Your incision starts to open up.  This information is not intended to replace advice given to you by your health care provider. Make sure you discuss any questions you have with your health care provider.

## 2023-03-16 NOTE — H&P (Signed)
  Electrophysiology Office Note:   Date:  03/16/2023  ID:  Isaiah Jones, DOB 11-06-1950, MRN [id number]  Primary Cardiologist: Lance Muss, MD Electrophysiologist: None      History of Present Illness:   Isaiah Jones is a 72 y.o. male with h/o chronic systolic heart failure, atrial fibrillation, hypertension, obesity, sleep apnea seen today for  for Electrophysiology evaluation of heart failure at the request of Everette Rank.    Today, denies symptoms of palpitations, chest pain, shortness of breath, orthopnea, PND, lower extremity edema, claudication, dizziness, presyncope, syncope, bleeding, or neurologic sequela. The patient is tolerating medications without difficulties. Plan CRT-D today.   EP Information / Studies Reviewed:    EKG is not ordered today. EKG from 12/06/22 reviewed which showed atrial fibrillation        Risk Assessment/Calculations:    CHA2DS2-VASc Score = 4   This indicates a 4.8% annual risk of stroke. The patient's score is based upon: CHF History: 1 HTN History: 1 Diabetes History: 0 Stroke History: 0 Vascular Disease History: 1 Age Score: 1 Gender Score: 0            Physical Exam:   VS:  There were no vitals taken for this visit.   Wt Readings from Last 3 Encounters:  02/07/23 121.3 kg  01/22/23 119.7 kg  01/12/23 118.8 kg    GEN: No acute distress.   Neck: No JVD Cardiac: RRR, no murmurs, rubs, or gallops.  Respiratory: decreased BS bases bilaterally. GI: Soft, nontender, non-distended  MS: No edema; No deformity. Neuro:  Nonfocal  Skin: warm and dry Psych: Normal affect    ASSESSMENT AND PLAN:    1.  Chronic systolic heart failure: Isaiah Jones has presented today for surgery, with the diagnosis of CHF.  The various methods of treatment have been discussed with the patient and family. After consideration of risks, benefits and other options for treatment, the patient has consented to  Procedure(s): CRTD implant as a  surgical intervention .  Risks include but not limited to bleeding, infection, pneumothorax, perforation, tamponade, vascular damage, renal failure, MI, stroke, death, and lead dislodgement . The patient's history has been reviewed, patient examined, no change in status, stable for surgery.  I have reviewed the patient's chart and labs.  Questions were answered to the patient's satisfaction.    Aleksandra Raben Elberta Fortis, MD 03/16/2023 1:59 PM

## 2023-03-19 ENCOUNTER — Encounter: Payer: Self-pay | Admitting: Internal Medicine

## 2023-03-19 DIAGNOSIS — R9431 Abnormal electrocardiogram [ECG] [EKG]: Secondary | ICD-10-CM | POA: Insufficient documentation

## 2023-03-19 MED FILL — Midazolam HCl Inj 2 MG/2ML (Base Equivalent): INTRAMUSCULAR | Qty: 2 | Status: AC

## 2023-03-20 ENCOUNTER — Telehealth: Payer: Self-pay

## 2023-03-20 ENCOUNTER — Telehealth: Payer: Self-pay | Admitting: Cardiology

## 2023-03-20 NOTE — Telephone Encounter (Signed)
Pt had an ICD put in on 11/15. He woke up this morning and cannot move his shoulder at all. Pt is in a lot of pain. He would like a nurse to call back

## 2023-03-20 NOTE — Telephone Encounter (Addendum)
Patient states last night he started having severe pain in his shoulder that increased over night.  He is now unable to move the shoulder at all.  Has take Robaxin and applied biofreeze.  Pain is severe and cannot move at all.   Patient rates pain a 9-10+ when tries to move the shoulder.  Compares it to when he had a kidney stone and it started moving.   CRT-D new implant on 03/16/2023.  To Dr. Estill Dooms for review and recommendations.

## 2023-03-20 NOTE — Transitions of Care (Post Inpatient/ED Visit) (Signed)
   03/20/2023  Name: Isaiah Jones MRN: [id number] DOB: [birth year]  Today's TOC FU Call Status: Today's TOC FU Call Status:: Successful TOC FU Call Completed TOC FU Call Complete Date: 03/20/23 Patient's Name and Date of Birth confirmed.  Transition Care Management Follow-up Telephone Call Date of Discharge: 03/16/23 Discharge Facility: [facility]) Type of Discharge: Emergency Department Reason for ED Visit: Other: (defibulator)  Items Reviewed:    Medications Reviewed Today: Medications Reviewed Today   Medications were not reviewed in this encounter     Home Care and Equipment/Supplies:    Functional Questionnaire:    Follow up appointments reviewed:    patient states he hasn't been to the ER since last week, and that was for his defibulator  SIGNATURE Karena Addison, LPN [facility] Nurse Health Advisor Direct Dial [phone]

## 2023-03-21 NOTE — Telephone Encounter (Signed)
Discussed with Dr. Elberta Fortis and reviewed the following orders with patient:   Start Ibuprofen 600mg  tid X 1 week Take OTC Prilosec while taking ibuprofen Hold Xarelto while on Ibuprofen.  If not improving in upcoming months we will refer to orthopedist.   Patient does say he is applying ice and taking the Tylenol and there is slight improvement today but he is still having difficulty. He thanks [place] for the call and will follow instructions.

## 2023-03-22 NOTE — Telephone Encounter (Signed)
Patient called back wanted to know if he could just use Tylenol.  Yes, Tylenol is even a safer choice if it works.  He does not have to hold xarelto or take prilosec if he only wants to use the Tylenol.  BUT, if he does use the Ibuprofen he needs to follow the instructions sent through my chart.   Patient verbalizes understanding.

## 2023-03-27 ENCOUNTER — Ambulatory Visit: Payer: Medicare Other | Admitting: Internal Medicine

## 2023-03-27 ENCOUNTER — Encounter: Payer: Self-pay | Admitting: Internal Medicine

## 2023-03-27 VITALS — BP 104/72 | HR 57 | Temp 98.0°F | Ht 74.0 in | Wt 276.6 lb

## 2023-03-27 DIAGNOSIS — I428 Other cardiomyopathies: Secondary | ICD-10-CM

## 2023-03-27 DIAGNOSIS — R82998 Other abnormal findings in urine: Secondary | ICD-10-CM

## 2023-03-27 DIAGNOSIS — Z789 Other specified health status: Secondary | ICD-10-CM | POA: Diagnosis not present

## 2023-03-27 DIAGNOSIS — I5022 Chronic systolic (congestive) heart failure: Secondary | ICD-10-CM | POA: Diagnosis not present

## 2023-03-27 DIAGNOSIS — D7589 Other specified diseases of blood and blood-forming organs: Secondary | ICD-10-CM | POA: Diagnosis not present

## 2023-03-27 DIAGNOSIS — I4819 Other persistent atrial fibrillation: Secondary | ICD-10-CM

## 2023-03-27 LAB — LIPID PANEL
Cholesterol: 115 mg/dL (ref 0–200)
HDL: 49.3 mg/dL (ref >39.00)
LDL Cholesterol: 48 mg/dL (ref 0–99)
NonHDL: 65.89
Total CHOL/HDL Ratio: 2
Triglycerides: 90 mg/dL (ref 0.0–149.0)
VLDL: 18 mg/dL (ref 0.0–40.0)

## 2023-03-27 LAB — COMPREHENSIVE METABOLIC PANEL
ALT: 15 U/L (ref 0–53)
AST: 21 U/L (ref 0–37)
Albumin: 3.9 g/dL (ref 3.5–5.2)
Alkaline Phosphatase: 63 U/L (ref 39–117)
BUN: 12 mg/dL (ref 6–23)
CO2: 32 meq/L (ref 19–32)
Calcium: 9.6 mg/dL (ref 8.4–10.5)
Chloride: 104 meq/L (ref 96–112)
Creatinine, Ser: 0.76 mg/dL (ref 0.40–1.50)
GFR: 89.14 mL/min (ref >60.00)
Glucose, Bld: 77 mg/dL (ref 70–99)
Potassium: 4.3 meq/L (ref 3.5–5.1)
Sodium: 141 meq/L (ref 135–145)
Total Bilirubin: 0.5 mg/dL (ref 0.2–1.2)
Total Protein: 6.2 g/dL (ref 6.0–8.3)

## 2023-03-27 LAB — CBC WITH DIFFERENTIAL/PLATELET
Basophils Absolute: 0.1 10*3/uL (ref 0.0–0.1)
Basophils Relative: 1 % (ref 0.0–3.0)
Eosinophils Absolute: 0.2 10*3/uL (ref 0.0–0.7)
Eosinophils Relative: 3.1 % (ref 0.0–5.0)
HCT: 45.7 % (ref 39.0–52.0)
Hemoglobin: 15 g/dL (ref 13.0–17.0)
Lymphocytes Relative: 30.2 % (ref 12.0–46.0)
Lymphs Abs: 1.9 10*3/uL (ref 0.7–4.0)
MCHC: 32.9 g/dL (ref 30.0–36.0)
MCV: 102 fL — ABNORMAL HIGH (ref 78.0–100.0)
Monocytes Absolute: 0.5 10*3/uL (ref 0.1–1.0)
Monocytes Relative: 7.9 % (ref 3.0–12.0)
Neutro Abs: 3.7 10*3/uL (ref 1.4–7.7)
Neutrophils Relative %: 57.8 % (ref 43.0–77.0)
Platelets: 215 10*3/uL (ref 150.0–400.0)
RBC: 4.48 Mil/uL (ref 4.22–5.81)
RDW: 15.2 % (ref 11.5–15.5)
WBC: 6.3 10*3/uL (ref 4.0–10.5)

## 2023-03-27 LAB — B12 AND FOLATE PANEL
Folate: >24.2 ng/mL (ref >5.9)
Vitamin B-12: 401 pg/mL (ref 211–911)

## 2023-03-27 LAB — MAGNESIUM: Magnesium: 1.8 mg/dL (ref 1.5–2.5)

## 2023-03-27 LAB — VITAMIN D 25 HYDROXY (VIT D DEFICIENCY, FRACTURES): VITD: 45.99 ng/mL (ref 30.00–100.00)

## 2023-03-27 NOTE — Assessment & Plan Note (Signed)
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

## 2023-03-27 NOTE — Patient Instructions (Addendum)
After Visit Summary  Your Health Updates:  1. Kidney Stone Prevention    - Collect urine for 24 hours using the kit provided by the lab    - Drink more water throughout the day    - Increase calcium through dairy products or supplements    - Avoid these high-oxalate foods:      * Spinach      * Rhubarb      * Nuts    - Take all medications as prescribed    - Report any new kidney pain promptly  2. Heart Device Care    - Keep surgical site clean and dry    - No heavy lifting for 6 weeks after surgery    - Avoid these activities:      * Welding      * Working on Animal nutritionist      * Placing cell phone in shirt pocket    - Carry your device ID card at all times    - Tell all healthcare providers about your device  3. Medications    - Resume Xarelto as previously prescribed    - Complete current course of ibuprofen    - Continue all other medications as prescribed    - Do not start new medications without checking with your doctor  4. Health Maintenance    - Avoid alcohol    - Weigh yourself daily - report gains of 2+ pounds in a day    - Keep all follow-up appointments    - Get lab work completed before next visit  Warning Signs - Call Immediately If: - Severe pain in back or side - Fever or chills - Device site becomes red, swollen, or painful - Feeling of device shock - Increased shortness of breath - Chest pain - Dizziness or fainting  Next Steps: 1. Complete 24-hour urine collection 2. Get blood work done 3. Follow up with device clinic as scheduled 4. Return to clinic in 4 weeks    Calcium Oxalate Stone Prevention Plan  Dietary Recommendations: 1. Calcium Intake    - Consume [phone] mg calcium daily    - Prefer dietary sources (dairy, calcium-fortified plant milk)    - Avoid calcium supplements  2. Fluid Intake    - Increase water consumption to 2-3 liters per day    - Goal: Dilute stone-forming substances  3. Dietary Modifications    - Limit animal  protein    - Reduce sodium intake (<2300 mg/day)    - Avoid high-oxalate foods (spinach, rhubarb, nuts)    - Limit sucrose and fructose intake    - Avoid high-dose vitamin C supplements  Medication Management: - Continue allopurinol 300 mg daily - Consider potassium citrate if 24-hour urine shows low citrate - Potential future thiazide diuretic if calcium excretion is high  Lifestyle Recommendations: - Stay hydrated - Maintain a balanced diet - Regular follow-up with urologist - Annual metabolic screening  Next Steps: 1. Schedule 24-hour urine collection 2. Complete parathyroid and vitamin D testing 3. Follow-up appointment to review test results and refine prevention strategy  Patient Education: - Understand stone formation mechanism - Importance of dietary and lifestyle modifications - Recognize early stone formation symptoms  Would you like me to elaborate on any part of this plan?

## 2023-03-27 NOTE — Progress Notes (Signed)
[facility]: [phone]   -- Medical Office Visit --  Patient:  Isaiah Jones      Age: 72 y.o.       Sex:  male  Date:   03/27/2023 Today's Healthcare Provider: Lula Olszewski, MD  ==========================================================================    Assessment & Plan Calcium oxalate crystals in urine Stone analysis: 95% calcium oxalate dihydrate, 5% monohydrate Initiating comprehensive metabolic evaluation Orders placed for:  24-hour urine collection for calcium, oxalate, citrate, sodium, creatinine Serum PTH, ionized calcium, vitamin D level Basic metabolic panel Continue allopurinol Dietary modifications:  Increase calcium intake through supplements/fortified foods Avoid high oxalate foods (spinach, rhubarb, nuts) Increase fluid intake Maintain moderate protein intake Will consider potassium citrate or thiazide diuretic based on 24-hour urine results Macrocytosis MCV 102 Likely alcohol-related B12 and folate levels normal Alcohol cessation strongly encouraged Chronic HFrEF (heart failure with reduced ejection fraction) (HCC)  Alcohol use Was discouraged, associated with gout/CHF/kidney stones/overweigh Nonischemic cardiomyopathy (HCC) Recent biventricular ICD placement 03/16/23 EF 25-30% with global hypokinesis Post-procedure shoulder immobility resolved with temporary hold of anticoagulation and NSAID therapy Continue current heart failure medications:  Sacubitril/valsartan Metoprolol succinate Spironolactone Device precautions reviewed Monitor for improvement in EF with CRT therapy Atrial fibrillation, persistent  Rate controlled on current regimen Recent Holter showing average HR 79 bpm Resume anticoagulation with rivaroxaban      Orders Placed During this Encounter:   ED Discharge Orders          Ordered    Calcium, urine, 24 hour        03/27/23 0905    Creatinine, urine, 24 hour        03/27/23 0905     Oxalate, urine, 24 hour        03/27/23 0905    Sodium, urine, 24 hour        03/27/23 0905    Citrate, urine, 24 hour        03/27/23 0905    PTH, Intact (ICMA) and Ionized Calcium        03/27/23 0905    CBC with Differential/Platelet        03/27/23 0905    Comp Met (CMET)        03/27/23 0905    Vitamin D (25 hydroxy)        03/27/23 0905    TSH Rfx on Abnormal to Free T4        03/27/23 0905    Magnesium        03/27/23 0905    Osmolality, urine        03/27/23 0905    Osmolality        03/27/23 0905    Creatinine, Urine        03/27/23 0905    Sodium, urine, random        03/27/23 0905    B12 and Folate Panel        03/27/23 0905    Pathologist smear review        03/27/23 0905    Protein Electrophoresis, (serum)        03/27/23 0905    Lipid panel        03/27/23 0905    Oxalate, Quant, 24-Hour Urine        03/27/23 1705          Diagnoses and all orders for this visit: Calcium oxalate crystals in urine -     Calcium, urine, 24 hour; Future -  Creatinine, urine, 24 hour; Future -     Oxalate, urine, 24 hour; Future -     Sodium, urine, 24 hour; Future -     Citrate, urine, 24 hour; Future -     PTH, Intact (ICMA) and Ionized Calcium -     CBC with Differential/Platelet -     Comp Met (CMET) -     Vitamin D (25 hydroxy) -     TSH Rfx on Abnormal to Free T4 -     Magnesium -     Osmolality, urine -     Osmolality -     Creatinine, Urine -     Sodium, urine, random -     Oxalate, Quant, 24-Hour Urine Macrocytosis -     Calcium, urine, 24 hour; Future -     B12 and Folate Panel -     Pathologist smear review -     Protein Electrophoresis, (serum) Chronic HFrEF (heart failure with reduced ejection fraction) (HCC) -     Lipid panel Alcohol use  Recommended:  Return to clinic in 4 weeks with 24-hour urine collection results Device clinic follow-up as scheduled Continue regular cardiology follow-up Future Appointments  Date Time Provider  Department Center  04/04/2023 12:00 PM [facility] DEVICE 1 [facility]  05/03/2023  8:20 AM O'Neal, Ronnald Ramp, MD [facility] None  06/18/2023  7:00 AM [facility] DEVICE REMOTES [facility]  06/21/2023  3:10 PM Sheilah Pigeon, PA-C [facility]  09/17/2023  7:10 AM [facility] DEVICE REMOTES [facility]  12/17/2023  7:00 AM [facility] DEVICE REMOTES [facility]  03/17/2024  7:00 AM [facility] DEVICE REMOTES [facility]  06/16/2024  7:00 AM [facility] DEVICE REMOTES [facility]  09/15/2024  7:00 AM [facility] DEVICE REMOTES [facility]  12/15/2024  7:00 AM [facility] DEVICE REMOTES [facility]  03/16/2025  7:00 AM [facility] DEVICE REMOTES [facility]  Patient Care Team: Lula Olszewski, MD as PCP - General (Internal Medicine) Corky Crafts, MD as PCP - Cardiology (Cardiology) Corky Crafts, MD as Consulting Physician (Cardiology) Crista Elliot, MD as Consulting Physician (Urology) Diamantina Monks, MD as Referring Physician (Gastroenterology) Ronnette Juniper, MD as Referring Physician (Pulmonary Disease) Marcene Corning, MD as Consulting Physician (Orthopedic Surgery) Santo Held as Consulting Physician (Dentistry) Claria Dice, MD as Attending Physician (Physical Medicine and Rehabilitation)    SUBJECTIVE: 72 y.o. male who has Atrial fibrillation, persistent; OSA on CPAP; Dyslipidemia; Erectile dysfunction; Essential hypertension; Gout; Osteoarthritis; Seasonal allergies; Depression, major, in remission (HCC) with anxiety; Vitamin D deficiency; S/P laparoscopic sleeve gastrectomy; Primary localized osteoarthritis of right hip; Former smoker; Chronic cough; Dysphagia; Hyperlipidemia; Hypertriglyceridemia; LBBB (left bundle branch block); 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); Anticoagulant long-term use; Achalasia of  esophagus; Chronic HFrEF (heart failure with reduced ejection fraction) (HCC); Presbycusis; Laryngopharyngeal reflux (LPR); Insomnia with sleep apnea; History of arthritis; Foot pain, right; Hematuria; Allergic rhinitis; Weight disorder; Hypertension; At risk for falls; CAD (coronary artery disease); Kidney stone; Chronic kidney disease, stage 2 (mild); Structural heart disease; Abnormal EKG; and Alcohol use on their problem list.  Main reasons for visit/main concerns/chief complaint: Anxiety (Pt here for f.u without any concerns ), Depression, Gastroesophageal Reflux, Hyperlipidemia, Sore Throat, and Congestive Heart Failure (Pt had defibrillator placed in 03/16/23)    AI-Extracted: Discussed the use of AI scribe software for clinical note transcription with the patient, who gave verbal consent to proceed.  History of Present Illness  The patient presents for follow-up of multiple  medical issues. Recently underwent biventricular ICD implantation on 03/16/23 for management of nonischemic cardiomyopathy with severely reduced ejection fraction (25-30%) and NYHA Class III heart failure symptoms. Post-procedure course was complicated by left shoulder immobility requiring temporary discontinuation of rivaroxaban and initiation of ibuprofen 600mg  daily for one week. Reports good compliance with post-procedure activity restrictions under caregiver supervision. Eager to resume normal activities but understanding of device precautions including avoiding welding, working on Animal nutrition[employer], and carrying cell phone in shirt pocket.  Recent diagnosis of recurrent calcium oxalate kidney stones, with stone analysis revealing 95% calcium oxalate dihydrate and 5% monohydrate composition. Has not received comprehensive dietary counseling for stone prevention. Reports recent normal urinalysis at outside facility but no 24-hour urine studies have been completed.  Review of recent diagnostic studies shows persistent atrial  fibrillation with controlled ventricular response. Recent echocardiogram demonstrates severely reduced left ventricular function with ejection fraction 25-30%, global hypokinesis, and left bundle branch block pattern. Labs notable for macrocytosis potentially related to alcohol use.  Patient denies any new medications since last visit. Current symptoms are stable without acute complaints.   Note that patient  has a past medical history of Anxiety, Arthritis, degenerative, Benign hypertensive heart disease without congestive heart failure, BMI 45.0-49.9, adult (HCC) (03/09/2018), CHF (congestive heart failure) (HCC), Chronic kidney disease, Colon polyps, Complication of anesthesia, Depression, Dyslipidemia, Dysrhythmia (2017), GERD (gastroesophageal reflux disease), Glaucoma, History of 2019 novel coronavirus disease (COVID-19) (06/2019), History of kidney stones, Hypertension, Hypertriglyceridemia (03/14/2022), OSA on CPAP, Persistent atrial fibrillation (HCC), Personal history of gout (05/23/2019), Pneumonia, PONV (postoperative nausea and vomiting), and Rhinitis, allergic.  Problem list overviews that were updated at today's visit: Problem  Alcohol Use    Med reconciliation: Current Outpatient Medications on File Prior to Visit  Medication Sig   allopurinol (ZYLOPRIM) 300 MG tablet Take 1 tablet (300 mg total) by mouth daily.   amoxicillin (AMOXIL) 250 MG capsule Take 1,000 mg by mouth See admin instructions. Take 1000 mg 1 hour prior to dental work   Azelastine HCl 137 MCG/SPRAY SOLN Place 2 sprays into both nostrils daily as needed (allergies).   Calcium Carb-Cholecalciferol (CALCIUM-VITAMIN D) 600-400 MG-UNIT TABS Take 1 tablet by mouth 3 (three) times daily.   Cholecalciferol (VITAMIN D3) 125 MCG (5000 UT) TABS Take 5,000 Units by mouth daily.   colchicine 0.6 MG tablet Take 1 tablet (0.6 mg total) by mouth daily. (Patient taking differently: Take 0.6 mg by mouth daily as needed (gout  flares).)   diphenhydramine-acetaminophen (TYLENOL PM) 25-500 MG TABS tablet Take 2 tablets by mouth at bedtime.    Evolocumab (REPATHA) 140 MG/ML SOSY Inject 140 mg into the skin every 14 (fourteen) days.   fexofenadine (ALLEGRA) 180 MG tablet Take 180 mg by mouth daily as needed for allergies or rhinitis.   Ginkgo 60 MG TABS Take 60 mg by mouth daily.   ipratropium-albuterol (DUONEB) 0.5-2.5 (3) MG/3ML SOLN Take 3 mLs by nebulization every 4 (four) hours as needed.   latanoprost (XALATAN) 0.005 % ophthalmic solution Place 1 drop into both eyes at bedtime.    methocarbamol (ROBAXIN-750) 750 MG tablet Take 1 tablet (750 mg total) by mouth 4 (four) times daily. (Patient taking differently: Take 750 mg by mouth every 6 (six) hours as needed for muscle spasms.)   metoprolol succinate (TOPROL XL) 25 MG 24 hr tablet Take 1 tablet (25 mg total) by mouth daily.   mometasone (NASONEX) 50 MCG/ACT nasal spray Place 1 spray into the nose at bedtime.   Multiple  Vitamin (MULTIVITAMIN WITH MINERALS) TABS tablet Take 1 tablet by mouth 2 (two) times daily. Bariatric Multivitamin   Multiple Vitamins-Minerals (AIRBORNE PO) Take 1 tablet by mouth daily as needed (immune health).    naproxen sodium (ALEVE) 220 MG tablet Take 220 mg by mouth.   Omega-3 Fatty Acids (FISH OIL PO) Take 1 capsule by mouth daily.   rosuvastatin (CRESTOR) 40 MG tablet Take 1 tablet (40 mg total) by mouth daily.   sacubitril-valsartan (ENTRESTO) 49-51 MG Take 1 tablet by mouth 2 (two) times daily.   Semaglutide-Weight Management (WEGOVY) 2.4 MG/0.75ML SOAJ Inject 2.4 mg into the skin once a week.   sertraline (ZOLOFT) 50 MG tablet Take 1 tablet (50 mg total) by mouth daily.   spironolactone (ALDACTONE) 25 MG tablet Take 0.5 tablets (12.5 mg total) by mouth daily.   acetaminophen (TYLENOL) 500 MG tablet Take 500-1,000 mg by mouth every 6 (six) hours as needed for moderate pain. (Patient not taking: Reported on 03/27/2023)   rivaroxaban  (XARELTO) 20 MG TABS tablet Take 1 tablet (20 mg total) by mouth daily with supper. (Patient not taking: Reported on 03/27/2023)   thiamine (VITAMIN B-1) 100 MG tablet Take 100 mg by mouth daily. (Patient not taking: Reported on 03/27/2023)   No current facility-administered medications on file prior to visit.  There are no discontinued medications.   Objective   Physical Exam     03/27/2023    8:20 AM 03/16/2023    7:35 PM 03/16/2023    6:42 PM  Vitals with BMI  Height 6\' 2"     Weight 276 lbs 10 oz    BMI 35.5    Systolic 104 134 097  Diastolic 72 77 61  Pulse 57 74 85   Wt Readings from Last 10 Encounters:  03/27/23 276 lb 9.6 oz (125.5 kg)  03/16/23 262 lb (118.8 kg)  02/07/23 267 lb 6.4 oz (121.3 kg)  01/22/23 264 lb (119.7 kg)  01/12/23 262 lb (118.8 kg)  01/10/23 264 lb 12.8 oz (120.1 kg)  12/26/22 275 lb (124.7 kg)  12/22/22 278 lb (126.1 kg)  12/06/22 276 lb (125.2 kg)  10/30/22 288 lb 9.6 oz (130.9 kg)   Vital signs reviewed.  Nursing notes reviewed. Weight trend reviewed.  General Appearance:  No acute distress appreciable.   Well-groomed, healthy-appearing male.  Well proportioned with no abnormal fat distribution.  Good muscle tone. Pulmonary:  Normal work of breathing at rest, no respiratory distress apparent. SpO2: 97 %  Musculoskeletal: All extremities are intact.  Neurological:  Awake, alert, oriented, and engaged.  No obvious focal neurological deficits or cognitive impairments.  Sensorium seems unclouded.   Speech is clear and coherent with logical content. Psychiatric:  Appropriate mood, pleasant and cooperative demeanor, thoughtful and engaged during the exam  Results            Results for orders placed or performed in visit on 03/27/23  CBC with Differential/Platelet  Result Value Ref Range   WBC 6.3 4.0 - 10.5 K/uL   RBC 4.48 4.22 - 5.81 Mil/uL   Hemoglobin 15.0 13.0 - 17.0 g/dL   HCT 35.3 29.9 - 24.2 %   MCV 102.0 (H) 78.0 - 100.0 fl    MCHC 32.9 30.0 - 36.0 g/dL   RDW 68.3 41.9 - 62.2 %   Platelets 215.0 150.0 - 400.0 K/uL   Neutrophils Relative % 57.8 43.0 - 77.0 %   Lymphocytes Relative 30.2 12.0 - 46.0 %   Monocytes Relative 7.9 3.0 -  12.0 %   Eosinophils Relative 3.1 0.0 - 5.0 %   Basophils Relative 1.0 0.0 - 3.0 %   Neutro Abs 3.7 1.4 - 7.7 K/uL   Lymphs Abs 1.9 0.7 - 4.0 K/uL   Monocytes Absolute 0.5 0.1 - 1.0 K/uL   Eosinophils Absolute 0.2 0.0 - 0.7 K/uL   Basophils Absolute 0.1 0.0 - 0.1 K/uL  Comp Met (CMET)  Result Value Ref Range   Sodium 141 135 - 145 mEq/L   Potassium 4.3 3.5 - 5.1 mEq/L   Chloride 104 96 - 112 mEq/L   CO2 32 19 - 32 mEq/L   Glucose, Bld 77 70 - 99 mg/dL   BUN 12 6 - 23 mg/dL   Creatinine, Ser 1.61 0.40 - 1.50 mg/dL   Total Bilirubin 0.5 0.2 - 1.2 mg/dL   Alkaline Phosphatase 63 39 - 117 U/L   AST 21 0 - 37 U/L   ALT 15 0 - 53 U/L   Total Protein 6.2 6.0 - 8.3 g/dL   Albumin 3.9 3.5 - 5.2 g/dL   GFR 09.60 >45.40 mL/min   Calcium 9.6 8.4 - 10.5 mg/dL  Vitamin D (25 hydroxy)  Result Value Ref Range   VITD 45.99 30.00 - 100.00 ng/mL  Magnesium  Result Value Ref Range   Magnesium 1.8 1.5 - 2.5 mg/dL  J81 and Folate Panel  Result Value Ref Range   Vitamin B-12 401 211 - 911 pg/mL   Folate >24.2 >5.9 ng/mL  Lipid panel  Result Value Ref Range   Cholesterol 115 0 - 200 mg/dL   Triglycerides 19.1 0.0 - 149.0 mg/dL   HDL 47.82 >95.62 mg/dL   VLDL 13.0 0.0 - 86.5 mg/dL   LDL Cholesterol 48 0 - 99 mg/dL   Total CHOL/HDL Ratio 2    NonHDL 65.89     Office Visit on 03/27/2023  Component Date Value   WBC 03/27/2023 6.3    RBC 03/27/2023 4.48    Hemoglobin 03/27/2023 15.0    HCT 03/27/2023 45.7    MCV 03/27/2023 102.0 (H)    MCHC 03/27/2023 32.9    RDW 03/27/2023 15.2    Platelets 03/27/2023 215.0    Neutrophils Relative % 03/27/2023 57.8    Lymphocytes Relative 03/27/2023 30.2    Monocytes Relative 03/27/2023 7.9    Eosinophils Relative 03/27/2023 3.1    Basophils  Relative 03/27/2023 1.0    Neutro Abs 03/27/2023 3.7    Lymphs Abs 03/27/2023 1.9    Monocytes Absolute 03/27/2023 0.5    Eosinophils Absolute 03/27/2023 0.2    Basophils Absolute 03/27/2023 0.1    Sodium 03/27/2023 141    Potassium 03/27/2023 4.3    Chloride 03/27/2023 104    CO2 03/27/2023 32    Glucose, Bld 03/27/2023 77    BUN 03/27/2023 12    Creatinine, Ser 03/27/2023 0.76    Total Bilirubin 03/27/2023 0.5    Alkaline Phosphatase 03/27/2023 63    AST 03/27/2023 21    ALT 03/27/2023 15    Total Protein 03/27/2023 6.2    Albumin 03/27/2023 3.9    GFR 03/27/2023 89.14    Calcium 03/27/2023 9.6    VITD 03/27/2023 45.99    Magnesium 03/27/2023 1.8    Vitamin B-12 03/27/2023 401    Folate 03/27/2023 >24.2    Cholesterol 03/27/2023 115    Triglycerides 03/27/2023 90.0    HDL 03/27/2023 49.30    VLDL 03/27/2023 18.0    LDL Cholesterol 03/27/2023 48    Total  CHOL/HDL Ratio 03/27/2023 2    NonHDL 03/27/2023 65.89   Orders Only on 03/07/2023  Component Date Value   WBC 03/07/2023 5.1    RBC 03/07/2023 4.61    Hemoglobin 03/07/2023 15.2    Hematocrit 03/07/2023 47.6    MCV 03/07/2023 103 (H)    MCH 03/07/2023 33.0    MCHC 03/07/2023 31.9    RDW 03/07/2023 13.7    Platelets 03/07/2023 184    Glucose 03/07/2023 80    BUN 03/07/2023 9    Creatinine, Ser 03/07/2023 0.79    eGFR 03/07/2023 94    BUN/Creatinine Ratio 03/07/2023 11    Sodium 03/07/2023 142    Potassium 03/07/2023 4.5    Chloride 03/07/2023 105    CO2 03/07/2023 28    Calcium 03/07/2023 9.6   Ancillary Procedure on 02/22/2023  Component Date Value   Area-P 1/2 02/22/2023 3.38    S' Lateral 02/22/2023 5.10    Est EF 02/22/2023 25 - 30%   Admission on 01/05/2023, Discharged on 01/05/2023  Component Date Value   Sodium 01/05/2023 131 (L)    Potassium 01/05/2023 4.2    Chloride 01/05/2023 96 (L)    CO2 01/05/2023 25    Glucose, Bld 01/05/2023 107 (H)    BUN 01/05/2023 13    Creatinine, Ser 01/05/2023  0.87    Calcium 01/05/2023 8.6 (L)    Total Protein 01/05/2023 6.5    Albumin 01/05/2023 3.3 (L)    AST 01/05/2023 13 (L)    ALT 01/05/2023 11    Alkaline Phosphatase 01/05/2023 61    Total Bilirubin 01/05/2023 1.7 (H)    GFR, Estimated 01/05/2023 >60    Anion gap 01/05/2023 10    WBC 01/05/2023 8.0    RBC 01/05/2023 4.32    Hemoglobin 01/05/2023 14.2    HCT 01/05/2023 42.7    MCV 01/05/2023 98.8    MCH 01/05/2023 32.9    MCHC 01/05/2023 33.3    RDW 01/05/2023 14.1    Platelets 01/05/2023 177    nRBC 01/05/2023 0.0    Neutrophils Relative % 01/05/2023 73    Neutro Abs 01/05/2023 5.7    Lymphocytes Relative 01/05/2023 15    Lymphs Abs 01/05/2023 1.2    Monocytes Relative 01/05/2023 12    Monocytes Absolute 01/05/2023 1.0    Eosinophils Relative 01/05/2023 0    Eosinophils Absolute 01/05/2023 0.0    Basophils Relative 01/05/2023 0    Basophils Absolute 01/05/2023 0.0    Immature Granulocytes 01/05/2023 0    Abs Immature Granulocytes 01/05/2023 0.03    Color, Urine 01/05/2023 YELLOW    APPearance 01/05/2023 CLEAR    Specific Gravity, Urine 01/05/2023 1.013    pH 01/05/2023 6.0    Glucose, UA 01/05/2023 NEGATIVE    Hgb urine dipstick 01/05/2023 SMALL (A)    Bilirubin Urine 01/05/2023 NEGATIVE    Ketones, ur 01/05/2023 20 (A)    Protein, ur 01/05/2023 NEGATIVE    Nitrite 01/05/2023 NEGATIVE    Leukocytes,Ua 01/05/2023 NEGATIVE    RBC / HPF 01/05/2023 0-5    WBC, UA 01/05/2023 0-5    Bacteria, UA 01/05/2023 NONE SEEN    Squamous Epithelial / HPF 01/05/2023 0-5    Mucus 01/05/2023 PRESENT   Office Visit on 12/22/2022  Component Date Value   Color, Urine 12/22/2022 YELLOW    APPearance 12/22/2022 CLEAR    Specific Gravity, Urine 12/22/2022 1.020    pH 12/22/2022 6.0    Total Protein, Urine 12/22/2022 NEGATIVE    Urine Glucose  12/22/2022 NEGATIVE    Ketones, ur 12/22/2022 NEGATIVE    Bilirubin Urine 12/22/2022 SMALL (A)    Hgb urine dipstick 12/22/2022 LARGE (A)     Urobilinogen, UA 12/22/2022 0.2    Leukocytes,Ua 12/22/2022 NEGATIVE    Nitrite 12/22/2022 NEGATIVE    WBC, UA 12/22/2022 0-2/hpf    RBC / HPF 12/22/2022 TNTC(>50/hpf) (A)    Squamous Epithelial / HPF 12/22/2022 Rare(0-4/hpf)    Sodium 12/22/2022 140    Potassium 12/22/2022 3.8    Chloride 12/22/2022 103    CO2 12/22/2022 29    Glucose, Bld 12/22/2022 79    BUN 12/22/2022 13    Creatinine, Ser 12/22/2022 0.95    Total Bilirubin 12/22/2022 0.6    Alkaline Phosphatase 12/22/2022 59    AST 12/22/2022 17    ALT 12/22/2022 13    Total Protein 12/22/2022 6.0    Albumin 12/22/2022 3.6    GFR 12/22/2022 79.53    Calcium 12/22/2022 9.4    Uric Acid, Serum 12/22/2022 5.0    Cholesterol 12/22/2022 76    Triglycerides 12/22/2022 97.0    HDL 12/22/2022 38.90 (L)    VLDL 12/22/2022 19.4    LDL Cholesterol 12/22/2022 18    Total CHOL/HDL Ratio 12/22/2022 2    NonHDL 12/22/2022 37.28    Hgb A1c MFr Bld 12/22/2022 5.5    VITD 12/22/2022 32.35    MICRO NUMBER: 12/22/2022 [id number]    SPECIMEN QUALITY: 12/22/2022 Adequate    Sample Source 12/22/2022 URINE    STATUS: 12/22/2022 FINAL    Result: 12/22/2022 No Growth   Admission on 12/06/2022, Discharged on 12/06/2022  Component Date Value   Color, Urine 12/06/2022 AMBER (A)    APPearance 12/06/2022 TURBID (A)    Specific Gravity, Urine 12/06/2022 1.025    pH 12/06/2022 7.0    Glucose, UA 12/06/2022 NEGATIVE    Hgb urine dipstick 12/06/2022 LARGE (A)    Bilirubin Urine 12/06/2022 MODERATE (A)    Ketones, ur 12/06/2022 40 (A)    Protein, ur 12/06/2022 100 (A)    Nitrite 12/06/2022 NEGATIVE    Leukocytes,Ua 12/06/2022 NEGATIVE    Sodium 12/06/2022 137    Potassium 12/06/2022 4.3    Chloride 12/06/2022 101    CO2 12/06/2022 23    Glucose, Bld 12/06/2022 131 (H)    BUN 12/06/2022 13    Creatinine, Ser 12/06/2022 1.23    Calcium 12/06/2022 9.3    GFR, Estimated 12/06/2022 >60    Anion gap 12/06/2022 13    WBC 12/06/2022 9.0    RBC  12/06/2022 5.03    Hemoglobin 12/06/2022 16.8    HCT 12/06/2022 49.7    MCV 12/06/2022 98.8    MCH 12/06/2022 33.4    MCHC 12/06/2022 33.8    RDW 12/06/2022 15.0    Platelets 12/06/2022 212    nRBC 12/06/2022 0.0    RBC / HPF 12/06/2022 >50    WBC, UA 12/06/2022 0-5    Bacteria, UA 12/06/2022 MANY (A)    Squamous Epithelial / HPF 12/06/2022 0-5    Troponin I (High Sensiti* 12/06/2022 7    Troponin I (High Sensiti* 12/06/2022 12    Specimen Description 12/06/2022                     Value:URINE, CLEAN CATCH Performed at [facility], [address] [place], [place] [address]    Special Requests 12/06/2022  Value:NONE Performed at [facility], [address] [place], [place] [address]    Culture 12/06/2022                     Value:NO GROWTH Performed at [facility], [address], [place], [place] [address]    Report Status 12/06/2022 12/07/2022 FINAL    Troponin I (High Sensiti* 12/06/2022 13   Office Visit on 10/30/2022  Component Date Value   Sodium 10/30/2022 139    Potassium 10/30/2022 5.1    Chloride 10/30/2022 104    CO2 10/30/2022 29    Glucose, Bld 10/30/2022 100 (H)    BUN 10/30/2022 13    Creatinine, Ser 10/30/2022 0.88    GFR 10/30/2022 85.52    Calcium 10/30/2022 9.7    Uric Acid, Serum 10/30/2022 4.1   Admission on 09/22/2022, Discharged on 09/22/2022  Component Date Value   pH, Ven 09/22/2022 7.370    pCO2, Ven 09/22/2022 45.8    pO2, Ven 09/22/2022 39    Bicarbonate 09/22/2022 26.4    TCO2 09/22/2022 28    O2 Saturation 09/22/2022 71    Acid-Base Excess 09/22/2022 1.0    Sodium 09/22/2022 140    Potassium 09/22/2022 3.7    Calcium, Ion 09/22/2022 1.34    HCT 09/22/2022 45.0    Hemoglobin 09/22/2022 15.3    Sample type 09/22/2022 VENOUS    Comment 09/22/2022 NOTIFIED PHYSICIAN    Activated Clotting Time 09/22/2022 239    pH, Arterial 09/22/2022 7.370    pCO2 arterial 09/22/2022 43.7    pO2, Arterial 09/22/2022 63  (L)    Bicarbonate 09/22/2022 25.3    TCO2 09/22/2022 27    O2 Saturation 09/22/2022 91    Acid-Base Excess 09/22/2022 0.0    Sodium 09/22/2022 140    Potassium 09/22/2022 3.7    Calcium, Ion 09/22/2022 1.31    HCT 09/22/2022 46.0    Hemoglobin 09/22/2022 15.6    Sample type 09/22/2022 ARTERIAL    pH, Ven 09/22/2022 7.369    pCO2, Ven 09/22/2022 44.9    pO2, Ven 09/22/2022 40    Bicarbonate 09/22/2022 25.9    TCO2 09/22/2022 27    O2 Saturation 09/22/2022 72    Acid-Base Excess 09/22/2022 0.0    Sodium 09/22/2022 140    Potassium 09/22/2022 3.7    Calcium, Ion 09/22/2022 1.35    HCT 09/22/2022 45.0    Hemoglobin 09/22/2022 15.3    Sample type 09/22/2022 VENOUS   Office Visit on 09/18/2022  Component Date Value   Glucose 09/18/2022 80    BUN 09/18/2022 13    Creatinine, Ser 09/18/2022 0.99    eGFR 09/18/2022 80    BUN/Creatinine Ratio 09/18/2022 13    Sodium 09/18/2022 141    Potassium 09/18/2022 4.3    Chloride 09/18/2022 103    CO2 09/18/2022 23    Calcium 09/18/2022 9.6    WBC 09/18/2022 6.0    RBC 09/18/2022 4.93    Hemoglobin 09/18/2022 16.2    Hematocrit 09/18/2022 48.6    MCV 09/18/2022 99 (H)    MCH 09/18/2022 32.9    [facility] 09/18/2022 33.3    RDW 09/18/2022 12.9    Platelets 09/18/2022 179   Ancillary Procedure on 08/23/2022  Component Date Value   Area-P 1/2 08/23/2022 3.87    S' Lateral 08/23/2022 4.90    Est EF 08/23/2022 25 - 30%   There may be more visits with results that are not included.   No  image results found.   DG Chest 2 View  Result Date: 03/16/2023 CLINICAL DATA:  Pacemaker EXAM: CHEST - 2 VIEW COMPARISON:  12/06/2022 FINDINGS: Interim placement left-sided pacing device with leads over right atrium, right ventricle and coronary sinus. Clips at the right cardio phrenic sulcus. Normal cardiac size with aortic atherosclerosis. No pleural effusion or pneumothorax. IMPRESSION: Interim placement of left-sided pacing device. No pneumothorax.  Electronically Signed   By: Jasmine Pang M.D.   On: 03/16/2023 19:44   EP PPM/ICD IMPLANT  Result Date: 03/16/2023 SURGEON: Loman Brooklyn, MD PREPROCEDURE DIAGNOSES: 1. Nonischemic cardiomyopathy. 2. New York Heart Association class III, heart failure chronically. 3. Left bundle-branch block. POSTPROCEDURE DIAGNOSES: 1. Nonischemic cardiomyopathy. 2. 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 3. Left bundle-branch block. PROCEDURES: 1. Left upper extremity venography 2. Biventricular ICD implantation. INTRODUCTION:  DRISTON ADERHOLT is a 72 y.o. male with a nonischemic CM (EF 25-30%), NYHA Class III CHF, and LBBB QRS morophology. At this time, he  meets MADIT II/ SCD-HeFT criteria for ICD implantation for primary prevention of sudden death. Given LBBB, the patient may also be expected to benefit from resynchronization therapy. The patient has been treated with an optimal medical regimen but continues to have a depressed ejection fraction and NYHA Class III CHF symptoms. he therefore presents today for a biventricular ICD implantation. DESCRIPTION OF PROCEDURE: Informed written consent was obtained and the patient was brought to the electrophysiology lab in the fasting state. The patient was adequately sedated with intravenous Versed, and fentanyl as outlined in the nursing report. The patient's left chest was prepped and draped in the usual sterile fashion by the EP lab staff. The skin overlying the left deltopectoral region was infiltrated with lidocaine for local analgesia. A 5-cm incision was made over the left deltopectoral region. A left subcutaneous defibrillator pocket was fashioned using a combination of sharp and blunt dissection. Electrocautery was used to assure hemostasis. RA/RV Lead Placement: The left axillary vein was cannulated with fluoroscopic visualization. No contrast was required for this endeavor. Through the left axillary vein, a Abbott Ultipace [id number] (serial #  [id number] ) right atrial lead and a Abbott Durata 7122 (serial number [id number]) right ventricular defibrillator lead were advanced with fluoroscopic visualization into the right atrial appendage and right ventricular apex positions respectively. Initial atrial lead fib-waves measured 1  mV with an impedance of 665. The right ventricular lead R-wave measured 5.7 mV with impedance of 494 ohms and a threshold of 1.1 volts at 0.5 milliseconds. LV Lead Placement: A Abbott 135 guide was advanced through the left axillary vein into the low lateral right atrium. A Bard curved Damato catheter was introduced through the guide and used to cannulate the coronary sinus. Coronary sinus cannulation was confirmed with electrogram recording from the hexapolar catheter. A selective coronary sinus venogram was performed by hand injection of nonionic contrast. This demonstrated a large CS body with very small/ atretic distal branches. There was a moderate sized lateral coronary sinus branch was noted along the mid portion of the CS body. No other posterior branches were identified. A Whisper CSJ wire was introduced through the guide and advanced into the distal posterolateral branch. A Abbott Quartet [id number] (serial number [id number] ) lead was advanced through the guide into the lateral branch. This was approximately one-thirds from the base to the apex in a very lateral position. In this location, the left ventricular lead R-waves measured 5.8 mV with impedance of 900 ohms and a threshold of  0.7 volt at 0.5 milliseconds in the bipolar LV1-LV2 configuration with no diaphragmatic stimulation observed when pacing at 10 volts output. The guide was therefore removed. All three leads were secured to the pectoralis fascia using #2 silk suture over the suture sleeves. The pocket then irrigated with copious gentamicin solution. The leads were then connected to a [facility] [id number] (BiV)  (serial Number [id number] ) device. The  defibrillator was placed into the pocket. The pocket was then closed in 3 layers with 2.0 Vicryl suture for the subcutaneous and 3.0 Vicryl suture subcuticular layers. Steri-Strips and a sterile dressing were then applied. DFT testing was not performed today. The procedure was therefore considered completed. EBL<71ml. There were no early apparhent complications. CONCLUSIONS: 1. Nonischemic cardiomyopathy with Left bundle-branch block and chronic New York Heart Association class III heart failure. 2. Successful biventricular ICD implantation. 3. No early apparent complications. During this procedure medications were administered to achieve and maintain moderate conscious sedation while the patient's heart rate, blood pressure, and oxygen saturation were continuously monitored and I was present face-to-face 100% of this time.   ECHOCARDIOGRAM COMPLETE  Result Date: 02/22/2023    ECHOCARDIOGRAM REPORT   Patient Name:   WARN KAJIWARA Date of Exam: 02/22/2023 Medical Rec #:  [id number]       Height:       74.0 in Accession #:    [id number]      Weight:       267.4 lb Date of Birth:  09/15/1950        BSA:          2.459 m Patient Age:    72 years        BP:           112/68 mmHg Patient Gender: M               HR:           82 bpm. Exam Location:  [address] Procedure: 2D Echo, Cardiac Doppler and Color Doppler Indications:    I42.8 Non ischemic cardiomyopathy  History:        Patient has prior history of Echocardiogram examinations, most                 recent 08/23/2022. Non ischemic cardiomyopathy, Arrythmias:Atrial                 Fibrillation; Risk Factors:Morbid obesity, Hypertension,                 Dyslipidemia and Former Smoker.  Sonographer:    Samule Ohm RDCS Referring Phys: [id number] WILL MARTIN CAMNITZ IMPRESSIONS  1. Left ventricular ejection fraction, by estimation, is 25 to 30%. The left ventricle has severely decreased function. The left ventricle demonstrates global hypokinesis. The left  ventricular internal cavity size was mildly dilated. There is mild left ventricular hypertrophy. Left ventricular diastolic function could not be evaluated.  2. Right ventricular systolic function is normal. The right ventricular size is mildly enlarged. Tricuspid regurgitation signal is inadequate for assessing PA pressure.  3. Left atrial size was severely dilated.  4. Right atrial size was severely dilated.  5. The mitral valve is normal in structure. Mild mitral valve regurgitation. No evidence of mitral stenosis.  6. The aortic valve is tricuspid. Aortic valve regurgitation is not visualized. Aortic valve sclerosis is present, with no evidence of aortic valve stenosis.  7. The inferior vena cava is normal in size with greater than 50% respiratory variability, suggesting right  atrial pressure of 3 mmHg. FINDINGS  Left Ventricle: Left ventricular ejection fraction, by estimation, is 25 to 30%. The left ventricle has severely decreased function. The left ventricle demonstrates global hypokinesis. The left ventricular internal cavity size was mildly dilated. There is mild left ventricular hypertrophy. Abnormal (paradoxical) septal motion, consistent with left bundle branch block. Left ventricular diastolic function could not be evaluated due to atrial fibrillation. Left ventricular diastolic function could not be evaluated. Right Ventricle: The right ventricular size is mildly enlarged. No increase in right ventricular wall thickness. Right ventricular systolic function is normal. Tricuspid regurgitation signal is inadequate for assessing PA pressure. Left Atrium: Left atrial size was severely dilated. Right Atrium: Right atrial size was severely dilated. Pericardium: There is no evidence of pericardial effusion. Mitral Valve: The mitral valve is normal in structure. Mild mitral valve regurgitation. No evidence of mitral valve stenosis. Tricuspid Valve: The tricuspid valve is normal in structure. Tricuspid valve  regurgitation is mild . No evidence of tricuspid stenosis. Aortic Valve: The aortic valve is tricuspid. Aortic valve regurgitation is not visualized. Aortic valve sclerosis is present, with no evidence of aortic valve stenosis. Pulmonic Valve: The pulmonic valve was normal in structure. Pulmonic valve regurgitation is trivial. No evidence of pulmonic stenosis. Aorta: The aortic root is normal in size and structure. Venous: The inferior vena cava is normal in size with greater than 50% respiratory variability, suggesting right atrial pressure of 3 mmHg. IAS/Shunts: No atrial level shunt detected by color flow Doppler.  LEFT VENTRICLE PLAX 2D LVIDd:         5.70 cm   Diastology LVIDs:         5.10 cm   LV e' medial:    9.59 cm/s LV PW:         1.20 cm   LV E/e' medial:  9.0 LV IVS:        1.20 cm   LV e' lateral:   11.50 cm/s LVOT diam:     2.30 cm   LV E/e' lateral: 7.5 LV SV:         52 LV SV Index:   21 LVOT Area:     4.15 cm  IVC IVC diam: 0.80 cm LEFT ATRIUM              Index        RIGHT ATRIUM           Index LA diam:        5.20 cm  2.11 cm/m   RA Pressure: 3.00 mmHg LA Vol (A2C):   158.0 ml 64.25 ml/m  RA Area:     29.10 cm LA Vol (A4C):   114.0 ml 46.36 ml/m  RA Volume:   107.00 ml 43.51 ml/m LA Biplane Vol: 136.0 ml 55.31 ml/m  AORTIC VALVE LVOT Vmax:   70.74 cm/s LVOT Vmean:  44.540 cm/s LVOT VTI:    0.126 m  AORTA Ao Root diam: 3.70 cm Ao Asc diam:  3.80 cm MITRAL VALVE               TRICUSPID VALVE MV Area (PHT): 3.38 cm    Estimated RAP:  3.00 mmHg MV Decel Time: 224 msec MV E velocity: 86.78 cm/s  SHUNTS                            Systemic VTI:  0.13 m  Systemic Diam: 2.30 cm Armanda Magic MD Electronically signed by Armanda Magic MD Signature Date/Time: 02/22/2023/8:23:26 AM    Final    LONG TERM MONITOR (3-14 DAYS)  Result Date: 01/17/2023   Persistent atrial fibrillation with occasional rapid ventricular response. Average HR 79 bpm.   Several pauses > 3 seconds,  mostly during sleeping hours.   Frequent PVCs. Short runs of nonsustained ventricular tachycardia.   Continue with plans for EP referral in the setting of LV dysfunction. Patch Wear Time:  13 days and 21 hours (2024-08-26T08:49:29-0400 to 2024-09-09T06:04:29-0400) 71 Ventricular Tachycardia runs occurred, the run with the fastest interval lasting 4 beats with a max rate of 185 bpm, the longest lasting 5 beats with an avg rate of 107 bpm. Atrial Fibrillation occurred continuously (100% burden), ranging from 34-188 bpm (avg of 79 bpm). Bundle Branch Block/IVCD was present. 9 Pauses occurred, the longest lasting 3.3 secs (18 bpm). Isolated VEs were frequent (5.7%, 78029), VE Couplets were occasional (1.4%, 9425), and VE Triplets were rare (<1.0%, 4107). Ventricular Bigeminy and Trigeminy were present.   DG C-Arm 1-60 Min-No Report  Result Date: 01/12/2023 Fluoroscopy was utilized by the requesting physician.  No radiographic interpretation.   CT Angio Chest/Abd/Pel for Dissection W and/or Wo Contrast  Result Date: 01/05/2023 CLINICAL DATA:  Back pain radiating to the shoulder. Concern for aortic aneurysm or dissection. EXAM: CT ANGIOGRAPHY CHEST, ABDOMEN AND PELVIS TECHNIQUE: Non-contrast CT of the chest was initially obtained. Multidetector CT imaging through the chest, abdomen and pelvis was performed using the standard protocol during bolus administration of intravenous contrast. Multiplanar reconstructed images and MIPs were obtained and reviewed to evaluate the vascular anatomy. RADIATION DOSE REDUCTION: This exam was performed according to the departmental dose-optimization program which includes automated exposure control, adjustment of the mA and/or kV according to patient size and/or use of iterative reconstruction technique. CONTRAST:  OMNIPAQUE IOHEXOL 350 MG/ML SOLN COMPARISON:  CT abdomen pelvis dated 12/06/2022. FINDINGS: CTA CHEST FINDINGS Cardiovascular: Mild cardiomegaly. No pericardial  effusion. There is 3 vessel coronary vascular calcification. There is mild atherosclerotic calcification of the thoracic aorta. No aneurysmal dilatation or dissection. The origins of the great vessels of the aortic arch and the central pulmonary arteries appear patent. Mediastinum/Nodes: No hilar or mediastinal adenopathy. Small hiatal hernia with slight patulous appearance of the esophagus. Several surgical clips in the distal esophagus similar to prior CT. Retained ingested content noted within the esophagus which can predispose to aspiration. No mediastinal fluid collection. Lungs/Pleura: There are bibasilar subpleural atelectasis. There is paraseptal emphysema. A 7 mm right middle lobe nodule (106/7). No focal consolidation cough pleural effusion, pneumothorax. The central airways are patent. Musculoskeletal: Degenerative changes of the spine. No acute osseous pathology. Review of the MIP images confirms the above findings. CTA ABDOMEN AND PELVIS FINDINGS VASCULAR Aorta: Moderate atherosclerotic calcification of the aorta. No aneurysmal dilatation or dissection. 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 No periaortic fluid collection. Celiac: There is atherosclerotic calcification of the origin of the celiac trunk. The celiac artery and its major branches are patent. SMA: The SMA is patent.  The hepatic artery arises from the SMA. Renals: There is atherosclerotic calcification of the origins of the renal arteries. The renal arteries remain patent. IMA: The IMA is patent. Inflow: Mild atherosclerotic calcification of the iliac arteries. The iliac arteries are patent. No aneurysmal dilatation or dissection. Veins: No obvious venous abnormality within the limitations of this arterial phase study. Review of the MIP images confirms the above  findings. NON-VASCULAR No intra-abdominal free air or free fluid. Hepatobiliary: The liver is unremarkable. No acute radial infection. The gallbladder is distended. No calcified  gallstone or pericholecystic fluid. Pancreas: Unremarkable. No pancreatic ductal dilatation or surrounding inflammatory changes. Spleen: Normal in size without focal abnormality. Adrenals/Urinary Tract: The adrenal glands unremarkable. Bilateral renal cysts measure up to 10 cm in the upper pole of the left kidney. Several additional smaller hypodense lesions are not characterized on this CT due to streak artifact caused by patient's arms. These can be better evaluated with ultrasound on a nonemergent basis. There is no hydronephrosis on either side. There is a 3 mm stone in the distal right ureter close to the ureterovesical junction. The left ureter is unremarkable. The urinary bladder is predominantly collapsed. Stomach/Bowel: There is moderate stool throughout the colon. There is distal colonic diverticulosis without active inflammatory changes. Postsurgical changes of gastric sleeve. No bowel obstruction or active inflammation. The appendix is normal. Lymphatic: No adenopathy. Reproductive: The prostate and seminal vesicles are grossly unremarkable. No pelvic mass. Other: None Musculoskeletal: Osteopenia with degenerative changes. Total right hip arthroplasty with associated streak artifact limiting evaluation of the pelvic structures. Left L5 pars defect no acute osseous pathology. Review of the MIP images confirms the above findings. IMPRESSION: 1. No acute intrathoracic, abdominal, or pelvic pathology. No aortic aneurysm or dissection. 2. 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 Recommend follow-up ultrasound every 5 years. This recommendation follows ACR consensus guidelines: White Paper of the ACR Incidental Findings Committee II on Vascular Findings. J Am Coll Radiol 2013; 10:789-794. 3. A 3 mm stone in the distal right ureter close to the ureterovesical junction. No hydronephrosis. 4. A 7 mm right middle lobe nodule. Non-contrast chest CT at 6-12 months is recommended. If the nodule is stable at time of  repeat CT, then future CT at 18-24 months (from today's scan) is considered optional for low-risk patients, but is recommended for high-risk patients. This recommendation follows the consensus statement: Guidelines for Management of Incidental Pulmonary Nodules Detected on CT Images: From the Fleischner Society 2017; Radiology 2017; 284:228-243. 5. Aortic Atherosclerosis (ICD10-I70.0) and Emphysema (ICD10-J43.9). Electronically Signed   By: Elgie Collard M.D.   On: 01/05/2023 20:19    DG Chest 2 View  Result Date: 03/16/2023 CLINICAL DATA:  Pacemaker EXAM: CHEST - 2 VIEW COMPARISON:  12/06/2022 FINDINGS: Interim placement left-sided pacing device with leads over right atrium, right ventricle and coronary sinus. Clips at the right cardio phrenic sulcus. Normal cardiac size with aortic atherosclerosis. No pleural effusion or pneumothorax. IMPRESSION: Interim placement of left-sided pacing device. No pneumothorax. Electronically Signed   By: Jasmine Pang M.D.   On: 03/16/2023 19:44   EP PPM/ICD IMPLANT  Result Date: 03/16/2023 SURGEON: Loman Brooklyn, MD PREPROCEDURE DIAGNOSES: 1. Nonischemic cardiomyopathy. 2. New York Heart Association class III, heart failure chronically. 3. Left bundle-branch block. POSTPROCEDURE DIAGNOSES: 1. Nonischemic cardiomyopathy. 2. 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 3. Left bundle-branch block. PROCEDURES: 1. Left upper extremity venography 2. Biventricular ICD implantation. INTRODUCTION:  QUINCY GENNUSO is a 72 y.o. male with a nonischemic CM (EF 25-30%), NYHA Class III CHF, and LBBB QRS morophology. At this time, he  meets MADIT II/ SCD-HeFT criteria for ICD implantation for primary prevention of sudden death. Given LBBB, the patient may also be expected to benefit from resynchronization therapy. The patient has been treated with an optimal medical regimen but continues to have a depressed ejection fraction and NYHA Class III CHF  symptoms. he  therefore presents today for a biventricular ICD implantation. DESCRIPTION OF PROCEDURE: Informed written consent was obtained and the patient was brought to the electrophysiology lab in the fasting state. The patient was adequately sedated with intravenous Versed, and fentanyl as outlined in the nursing report. The patient's left chest was prepped and draped in the usual sterile fashion by the EP lab staff. The skin overlying the left deltopectoral region was infiltrated with lidocaine for local analgesia. A 5-cm incision was made over the left deltopectoral region. A left subcutaneous defibrillator pocket was fashioned using a combination of sharp and blunt dissection. Electrocautery was used to assure hemostasis. RA/RV Lead Placement: The left axillary vein was cannulated with fluoroscopic visualization. No contrast was required for this endeavor. Through the left axillary vein, a Abbott Ultipace [id number] (serial # [id number] ) right atrial lead and a Abbott Durata 7122 (serial number [id number]) right ventricular defibrillator lead were advanced with fluoroscopic visualization into the right atrial appendage and right ventricular apex positions respectively. Initial atrial lead fib-waves measured 1  mV with an impedance of 665. The right ventricular lead R-wave measured 5.7 mV with impedance of 494 ohms and a threshold of 1.1 volts at 0.5 milliseconds. LV Lead Placement: A Abbott 135 guide was advanced through the left axillary vein into the low lateral right atrium. A Bard curved Damato catheter was introduced through the guide and used to cannulate the coronary sinus. Coronary sinus cannulation was confirmed with electrogram recording from the hexapolar catheter. A selective coronary sinus venogram was performed by hand injection of nonionic contrast. This demonstrated a large CS body with very small/ atretic distal branches. There was a moderate sized lateral coronary sinus branch was noted along the mid portion  of the CS body. No other posterior branches were identified. A Whisper CSJ wire was introduced through the guide and advanced into the distal posterolateral branch. A Abbott Quartet [id number] (serial number [id number] ) lead was advanced through the guide into the lateral branch. This was approximately one-thirds from the base to the apex in a very lateral position. In this location, the left ventricular lead R-waves measured 5.8 mV with impedance of 900 ohms and a threshold of 0.7 volt at 0.5 milliseconds in the bipolar LV1-LV2 configuration with no diaphragmatic stimulation observed when pacing at 10 volts output. The guide was therefore removed. All three leads were secured to the pectoralis fascia using #2 silk suture over the suture sleeves. The pocket then irrigated with copious gentamicin solution. The leads were then connected to a [facility] [id number] ([id number])  (serial Number [id number] ) device. The defibrillator was placed into the pocket. The pocket was then closed in 3 layers with 2.0 Vicryl suture for the subcutaneous and 3.0 Vicryl suture subcuticular layers. Steri-Strips and a sterile dressing were then applied. DFT testing was not performed today. The procedure was therefore considered completed. EBL<32ml. There were no early apparhent complications. CONCLUSIONS: 1. Nonischemic cardiomyopathy with Left bundle-branch block and chronic New York Heart Association class III heart failure. 2. Successful biventricular ICD implantation. 3. No early apparent complications. During this procedure medications were administered to achieve and maintain moderate conscious sedation while the patient's heart rate, blood pressure, and oxygen saturation were continuously monitored and I was present face-to-face 100% of this time.         Additional Info: This encounter employed real-time, collaborative documentation. The patient actively reviewed and updated their medical record on a shared screen, ensuring  transparency and facilitating joint problem-solving  for the problem list, overview, and plan. This approach promotes accurate, informed care. The treatment plan was discussed and reviewed in detail, including medication safety, potential side effects, and all patient questions. We confirmed understanding and comfort with the plan. Follow-up instructions were established, including contacting the office for any concerns, returning if symptoms worsen, persist, or new symptoms develop, and precautions for potential emergency department visits.

## 2023-03-27 NOTE — Assessment & Plan Note (Signed)
Was discouraged, associated with gout/CHF/kidney stones/overweigh

## 2023-03-27 NOTE — Assessment & Plan Note (Signed)
  Rate controlled on current regimen Recent Holter showing average HR 79 bpm Resume anticoagulation with rivaroxaban

## 2023-03-28 LAB — PATHOLOGIST SMEAR REVIEW

## 2023-03-28 LAB — TSH RFX ON ABNORMAL TO FREE T4: TSH: 0.967 u[IU]/mL (ref 0.450–4.500)

## 2023-04-02 ENCOUNTER — Encounter: Payer: Self-pay | Admitting: Internal Medicine

## 2023-04-02 LAB — CREATININE, URINE, RANDOM: Creatinine, Urine: 185 mg/dL (ref 20–320)

## 2023-04-02 LAB — PTH, INTACT (ICMA) AND IONIZED CALCIUM
Calcium, Ion: 5.3 mg/dL (ref 4.7–5.5)
Calcium: 9.7 mg/dL (ref 8.6–10.3)
PTH: 31 pg/mL (ref 16–77)

## 2023-04-02 LAB — OSMOLALITY, URINE: Osmolality, Ur: 810 mosm/kg (ref 50–1200)

## 2023-04-02 LAB — PROTEIN ELECTROPHORESIS, SERUM
Albumin ELP: 3.5 g/dL — ABNORMAL LOW (ref 3.8–4.8)
Alpha 1: 0.3 g/dL (ref 0.2–0.3)
Alpha 2: 0.8 g/dL (ref 0.5–0.9)
Beta 2: 0.3 g/dL (ref 0.2–0.5)
Beta Globulin: 0.4 g/dL (ref 0.4–0.6)
Gamma Globulin: 0.5 g/dL — ABNORMAL LOW (ref 0.8–1.7)
Total Protein: 5.8 g/dL — ABNORMAL LOW (ref 6.1–8.1)

## 2023-04-02 LAB — OSMOLALITY: Osmolality: 296 mosm/kg (ref 278–305)

## 2023-04-02 LAB — SODIUM, URINE, RANDOM: Sodium, Ur: 162 mmol/L (ref 28–272)

## 2023-04-02 NOTE — Patient Instructions (Signed)

## 2023-04-03 ENCOUNTER — Ambulatory Visit: Payer: Medicare Other | Admitting: Interventional Cardiology

## 2023-04-03 NOTE — Telephone Encounter (Signed)
Patient's review of lab results/notes confirmed.

## 2023-04-04 ENCOUNTER — Ambulatory Visit: Payer: Medicare Other | Attending: Internal Medicine

## 2023-04-04 ENCOUNTER — Encounter: Payer: Self-pay | Admitting: Internal Medicine

## 2023-04-04 DIAGNOSIS — I428 Other cardiomyopathies: Secondary | ICD-10-CM | POA: Diagnosis not present

## 2023-04-04 DIAGNOSIS — D801 Nonfamilial hypogammaglobulinemia: Secondary | ICD-10-CM | POA: Insufficient documentation

## 2023-04-04 DIAGNOSIS — I447 Left bundle-branch block, unspecified: Secondary | ICD-10-CM

## 2023-04-05 LAB — CUP PACEART INCLINIC DEVICE CHECK
Battery Remaining Longevity: 75 mo
Brady Statistic RA Percent Paced: 0 %
Brady Statistic RV Percent Paced: 53 %
Date Time Interrogation Session: 20241204120300
HighPow Impedance: 64.125
Implantable Lead Connection Status: 753985
Implantable Lead Connection Status: 753985
Implantable Lead Connection Status: 753985
Implantable Lead Implant Date: 20241115
Implantable Lead Implant Date: 20241115
Implantable Lead Implant Date: 20241115
Implantable Lead Location: 753858
Implantable Lead Location: 753859
Implantable Lead Location: 753860
Implantable Pulse Generator Implant Date: 20241115
Lead Channel Impedance Value: 450 Ohm
Lead Channel Impedance Value: 525 Ohm
Lead Channel Impedance Value: 725 Ohm
Lead Channel Pacing Threshold Amplitude: 0.5 V
Lead Channel Pacing Threshold Amplitude: 0.5 V
Lead Channel Pacing Threshold Amplitude: 1 V
Lead Channel Pacing Threshold Amplitude: 1 V
Lead Channel Pacing Threshold Pulse Width: 0.5 ms
Lead Channel Pacing Threshold Pulse Width: 0.5 ms
Lead Channel Pacing Threshold Pulse Width: 0.5 ms
Lead Channel Pacing Threshold Pulse Width: 0.5 ms
Lead Channel Sensing Intrinsic Amplitude: 4.2 mV
Lead Channel Setting Pacing Amplitude: 3.5 V
Lead Channel Setting Pacing Amplitude: 3.5 V
Lead Channel Setting Pacing Pulse Width: 0.5 ms
Lead Channel Setting Pacing Pulse Width: 0.5 ms
Lead Channel Setting Sensing Sensitivity: 0.5 mV
Pulse Gen Serial Number: 810108245
Zone Setting Status: 755011

## 2023-04-05 NOTE — Progress Notes (Signed)
Wound check appointment. Steri-strips removed. Wound without redness or edema. Incision edges approximated, wound well healed. Normal device function. Thresholds, sensing, and impedances consistent with implant measurements. Device programmed at 3.5V for extra safety margin until 3 month visit. Histogram distribution appropriate for patient and level of activity. Permanent afib on Xarelto.  Brief ventricular arrhythmias noted that appear to be brief episodes of afib with RVR. Patient educated about wound care, arm mobility, lifting restrictions, shock plan. ROV in 3 months with implanting physician.  Pt BIV pacing 53%.

## 2023-04-11 ENCOUNTER — Telehealth: Payer: Self-pay | Admitting: *Deleted

## 2023-04-11 MED ORDER — METOPROLOL SUCCINATE ER 50 MG PO TB24: 50.0000 mg | ORAL_TABLET | Freq: Every day | ORAL | 3 refills | Status: DC

## 2023-04-11 NOTE — Telephone Encounter (Signed)
Pt agreeable to plan. Rx sent to [facility] as requested. Advised to call office if issues arise after medication increase. Patient verbalized understanding and agreeable to plan.

## 2023-04-11 NOTE — Telephone Encounter (Signed)
[name], MD  Wiliam Ke, RN; Baird Lyons, RN Increase Toprol-XL to 50 mg daily.       Previous Messages    ----- Message ----- From: Wiliam Ke, RN Sent: 04/05/2023   1:20 PM EST To: Will Jorja Loa, MD; Baird Lyons, RN Subject: afib                                          Hey folks, I saw this Pt yesterday in device clinic for his wound check.  He is only BIV pacing 53% because his afib is trickling down and disrupting the pacing party.  I figure you have a plan for him.  I worry that he could inadvertently get a shock.  Just wanted to send out an FYI.  Boneta Lucks

## 2023-04-26 ENCOUNTER — Other Ambulatory Visit: Payer: Self-pay | Admitting: Internal Medicine

## 2023-04-26 DIAGNOSIS — F325 Major depressive disorder, single episode, in full remission: Secondary | ICD-10-CM

## 2023-04-29 NOTE — Progress Notes (Signed)
 Cardiology Office Note:  .   Date:  05/03/2023  ID:  Isaiah Jones, DOB 1950/09/24, MRN [id number] PCP: Lula Olszewski, MD  [facility] Providers Cardiologist:  Reatha Harps, MD { History of Present Illness: .   Isaiah Jones is a 72 y.o. male with history of CHF, persistent Afib, HTN, Hld, CAD who presents for follow-up. S/p CRT-D.    History of Present Illness   Isaiah Jones, a 72 year old male with a history of systolic heart failure, atrial fibrillation, left bundle branch block, and nonobstructive coronary artery disease, presents for a follow-up visit after recent CRTD implantation. Despite the CRTD, his ejection fraction has not improved. He reports a new sensation of his heart pounding, particularly in his back, which he first noticed on Christmas Eve. This sensation is strong enough to wake him from sleep. He also reports that his metoprolol dosage was recently increased from 25mg  to 50mg . Review of ppm interrogation shows rapid Afib. QRS 146 ms, not getting benefit of CRT. He has not started to exercise much, but will start. We discussed amiodarone and attempting DCCV again. He has a family history of heart failure, with both his father and mother having had heart conditions. He denies any history of diabetes, heart attack, or stroke. He is a non-smoker and consumes alcohol occasionally.          Problem List Systolic HF s/p CRT-D -2/2 Afib/LBBB? 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 Persistent Afib  4. LBBB -QRS 154 ms 5. HLD -T chol 115, HDL 49, LDL 48, TG  90 -A1c 5.5    ROS: All other ROS reviewed and negative. Pertinent positives noted in the HPI.     Studies Reviewed: Marland Kitchen        TTE 02/22/2023  1. Left ventricular ejection fraction, by estimation, is 25 to 30%. The  left ventricle has severely decreased function. The left ventricle  demonstrates  global hypokinesis. The left ventricular internal cavity size  was mildly dilated. There is mild left  ventricular hypertrophy. Left ventricular diastolic function could not be  evaluated.   2. Right ventricular systolic function is normal. The right ventricular  size is mildly enlarged. Tricuspid regurgitation signal is inadequate for  assessing PA pressure.   3. Left atrial size was severely dilated.   4. Right atrial size was severely dilated.   5. The mitral valve is normal in structure. Mild mitral valve  regurgitation. No evidence of mitral stenosis.   6. The aortic valve is tricuspid. Aortic valve regurgitation is not  visualized. Aortic valve sclerosis is present, with no evidence of aortic  valve stenosis.   7. The inferior vena cava is normal in size with greater than 50%  respiratory variability, suggesting right atrial pressure of 3 mmHg.  Physical Exam:   VS:  BP 130/85   Ht 6\' 2"  (1.88 m)   Wt 275 lb 9.6 oz (125 kg)   BMI 35.38 kg/m    Wt Readings from Last 3 Encounters:  05/03/23 275 lb 9.6 oz (125 kg)  03/27/23 276 lb 9.6 oz (125.5 kg)  03/16/23 262 lb (118.8 kg)    GEN: Well nourished, well developed in no acute distress NECK: No JVD; No carotid bruits CARDIAC: irregular rhythm, no murmurs, rubs, gallops RESPIRATORY:  Clear to auscultation without rales, wheezing or rhonchi  ABDOMEN: Soft, non-tender, non-distended EXTREMITIES:  No  edema; No deformity  ASSESSMENT AND PLAN: .   Assessment and Plan    Systolic Heart Failure, EF 25-30% Nonischemic etiology  Persistent systolic dysfunction despite CRTD implantation. Likely due to atrial fibrillation and left bundle branch block causing dyssynchrony. QRS 146 ms on EKG. today.  -Continue Metoprolol succinate 50mg  daily, Entresto 49-51mg  BID, and Aldactone 12.5mg  daily. -Add Jardiance 10mg  daily. -We will plan for amiodarone load and to get him in NSR.  -needs LV lead optimized by EP; I will reach out.  -needs to  be considered for ablation   Atrial Fibrillation, persistent  Persistent despite previous cardioversions. Rapid rate and irregular rhythm likely contributing to systolic dysfunction. Also reports symptoms of rapid heart beat; symptomatic Afib on device interrogation  -Start Amiodarone 400mg  BID for 7 days, then 200mg  daily. -Plan for cardioversion on 05/17/2023. -Continue Xarelto for stroke prophylaxis. -Consider ablation if rhythm control is achieved.  Non-obstructive CAD LDL at goal on Repatha due to statin intolerance. -Continue Repatha.   Preprocedure -labs today including CBC, BMP  Follow-up in 6 weeks.          Informed Consent   Shared Decision Making/Informed Consent The risks (stroke, cardiac arrhythmias rarely resulting in the need for a temporary or permanent pacemaker, skin irritation or burns and complications associated with conscious sedation including aspiration, arrhythmia, respiratory failure and death), benefits (restoration of normal sinus rhythm) and alternatives of a direct current cardioversion were explained in detail to Mr. Closson and he agrees to proceed.        Follow-up: Return in about 6 weeks (around 06/14/2023).  Time Spent with Patient: I have spent a total of 35 minutes caring for this patient today face to face, ordering and reviewing labs/tests, reviewing prior records/medical history, examining the patient, establishing an assessment and plan, communicating results/findings to the patient/family, and documenting in the medical record.   Signed, Lenna Gilford. [place] Lipps, MD, [facility]  [facility]  [address], [address] [place], [place] [address] [phone]  9:28 AM

## 2023-04-29 NOTE — H&P (View-Only) (Signed)
Cardiology Office Note:  .   Date:  05/03/2023  ID:  Isaiah Jones, DOB 1950/09/24, MRN [id number] PCP: Lula Olszewski, MD  [facility] Providers Cardiologist:  Reatha Harps, MD { History of Present Illness: .   Isaiah Jones is a 72 y.o. male with history of CHF, persistent Afib, HTN, Hld, CAD who presents for follow-up. S/p CRT-D.    History of Present Illness   Mr. Wonders, a 72 year old male with a history of systolic heart failure, atrial fibrillation, left bundle branch block, and nonobstructive coronary artery disease, presents for a follow-up visit after recent CRTD implantation. Despite the CRTD, his ejection fraction has not improved. He reports a new sensation of his heart pounding, particularly in his back, which he first noticed on Christmas Eve. This sensation is strong enough to wake him from sleep. He also reports that his metoprolol dosage was recently increased from 25mg  to 50mg . Review of ppm interrogation shows rapid Afib. QRS 146 ms, not getting benefit of CRT. He has not started to exercise much, but will start. We discussed amiodarone and attempting DCCV again. He has a family history of heart failure, with both his father and mother having had heart conditions. He denies any history of diabetes, heart attack, or stroke. He is a non-smoker and consumes alcohol occasionally.          Problem List Systolic HF s/p CRT-D -2/2 Afib/LBBB? 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 Persistent Afib  4. LBBB -QRS 154 ms 5. HLD -T chol 115, HDL 49, LDL 48, TG  90 -A1c 5.5    ROS: All other ROS reviewed and negative. Pertinent positives noted in the HPI.     Studies Reviewed: Marland Kitchen        TTE 02/22/2023  1. Left ventricular ejection fraction, by estimation, is 25 to 30%. The  left ventricle has severely decreased function. The left ventricle  demonstrates  global hypokinesis. The left ventricular internal cavity size  was mildly dilated. There is mild left  ventricular hypertrophy. Left ventricular diastolic function could not be  evaluated.   2. Right ventricular systolic function is normal. The right ventricular  size is mildly enlarged. Tricuspid regurgitation signal is inadequate for  assessing PA pressure.   3. Left atrial size was severely dilated.   4. Right atrial size was severely dilated.   5. The mitral valve is normal in structure. Mild mitral valve  regurgitation. No evidence of mitral stenosis.   6. The aortic valve is tricuspid. Aortic valve regurgitation is not  visualized. Aortic valve sclerosis is present, with no evidence of aortic  valve stenosis.   7. The inferior vena cava is normal in size with greater than 50%  respiratory variability, suggesting right atrial pressure of 3 mmHg.  Physical Exam:   VS:  BP 130/85   Ht 6\' 2"  (1.88 m)   Wt 275 lb 9.6 oz (125 kg)   BMI 35.38 kg/m    Wt Readings from Last 3 Encounters:  05/03/23 275 lb 9.6 oz (125 kg)  03/27/23 276 lb 9.6 oz (125.5 kg)  03/16/23 262 lb (118.8 kg)    GEN: Well nourished, well developed in no acute distress NECK: No JVD; No carotid bruits CARDIAC: irregular rhythm, no murmurs, rubs, gallops RESPIRATORY:  Clear to auscultation without rales, wheezing or rhonchi  ABDOMEN: Soft, non-tender, non-distended EXTREMITIES:  No  edema; No deformity  ASSESSMENT AND PLAN: .   Assessment and Plan    Systolic Heart Failure, EF 25-30% Nonischemic etiology  Persistent systolic dysfunction despite CRTD implantation. Likely due to atrial fibrillation and left bundle branch block causing dyssynchrony. QRS 146 ms on EKG. today.  -Continue Metoprolol succinate 50mg  daily, Entresto 49-51mg  BID, and Aldactone 12.5mg  daily. -Add Jardiance 10mg  daily. -We will plan for amiodarone load and to get him in NSR.  -needs LV lead optimized by EP; I will reach out.  -needs to  be considered for ablation   Atrial Fibrillation, persistent  Persistent despite previous cardioversions. Rapid rate and irregular rhythm likely contributing to systolic dysfunction. Also reports symptoms of rapid heart beat; symptomatic Afib on device interrogation  -Start Amiodarone 400mg  BID for 7 days, then 200mg  daily. -Plan for cardioversion on 05/17/2023. -Continue Xarelto for stroke prophylaxis. -Consider ablation if rhythm control is achieved.  Non-obstructive CAD LDL at goal on Repatha due to statin intolerance. -Continue Repatha.   Preprocedure -labs today including CBC, BMP  Follow-up in 6 weeks.          Informed Consent   Shared Decision Making/Informed Consent The risks (stroke, cardiac arrhythmias rarely resulting in the need for a temporary or permanent pacemaker, skin irritation or burns and complications associated with conscious sedation including aspiration, arrhythmia, respiratory failure and death), benefits (restoration of normal sinus rhythm) and alternatives of a direct current cardioversion were explained in detail to Mr. Closson and he agrees to proceed.        Follow-up: Return in about 6 weeks (around 06/14/2023).  Time Spent with Patient: I have spent a total of 35 minutes caring for this patient today face to face, ordering and reviewing labs/tests, reviewing prior records/medical history, examining the patient, establishing an assessment and plan, communicating results/findings to the patient/family, and documenting in the medical record.   Signed, Lenna Gilford. [place] Lipps, MD, [facility]  [facility]  [address], [address] [place], [place] [address] [phone]  9:28 AM

## 2023-05-03 ENCOUNTER — Ambulatory Visit: Payer: Medicare Other | Attending: Cardiovascular Disease | Admitting: Cardiovascular Disease

## 2023-05-03 ENCOUNTER — Encounter: Payer: Self-pay | Admitting: Cardiovascular Disease

## 2023-05-03 VITALS — BP 130/85 | Ht 74.0 in | Wt 275.6 lb

## 2023-05-03 DIAGNOSIS — I10 Essential (primary) hypertension: Secondary | ICD-10-CM

## 2023-05-03 DIAGNOSIS — I2583 Coronary atherosclerosis due to lipid rich plaque: Secondary | ICD-10-CM

## 2023-05-03 DIAGNOSIS — I2510 Atherosclerotic heart disease of native coronary artery without angina pectoris: Secondary | ICD-10-CM

## 2023-05-03 DIAGNOSIS — E785 Hyperlipidemia, unspecified: Secondary | ICD-10-CM

## 2023-05-03 DIAGNOSIS — I4819 Other persistent atrial fibrillation: Secondary | ICD-10-CM

## 2023-05-03 DIAGNOSIS — I5022 Chronic systolic (congestive) heart failure: Secondary | ICD-10-CM

## 2023-05-03 DIAGNOSIS — G4733 Obstructive sleep apnea (adult) (pediatric): Secondary | ICD-10-CM

## 2023-05-03 DIAGNOSIS — I447 Left bundle-branch block, unspecified: Secondary | ICD-10-CM | POA: Diagnosis not present

## 2023-05-03 LAB — BASIC METABOLIC PANEL
BUN/Creatinine Ratio: 12 (ref 10–24)
BUN: 12 mg/dL (ref 8–27)
CO2: 25 mmol/L (ref 20–29)
Calcium: 10.1 mg/dL (ref 8.6–10.2)
Chloride: 104 mmol/L (ref 96–106)
Creatinine, Ser: 0.97 mg/dL (ref 0.76–1.27)
Glucose: 77 mg/dL (ref 70–99)
Potassium: 4.4 mmol/L (ref 3.5–5.2)
Sodium: 144 mmol/L (ref 134–144)
eGFR: 82 mL/min/{1.73_m2} (ref >59)

## 2023-05-03 MED ORDER — EMPAGLIFLOZIN 10 MG PO TABS: 10.0000 mg | ORAL_TABLET | Freq: Every day | ORAL | 11 refills | Status: DC

## 2023-05-03 MED ORDER — AMIODARONE HCL 200 MG PO TABS: 200.0000 mg | ORAL_TABLET | Freq: Every day | ORAL | 3 refills | Status: DC

## 2023-05-03 NOTE — Patient Instructions (Signed)
 Medication Instructions:  Start Jardiance  10 mg by mouth daily.  Start amiodarone  as follows: take 2 tablets of the 200mg  by mouth twice per day x 7 days then take one tablet of the 200 mg  by mouth daily.  *If you need a refill on your cardiac medications before your next appointment, please call your pharmacy*   Lab Work: CBC BMET today. If you have labs (blood work) drawn today and your tests are completely normal, you will receive your results only by: MyChart Message (if you have MyChart) OR A paper copy in the mail If you have any lab test that is abnormal or we need to change your treatment, we will call you to review the results.   Testing/Procedures:     Dear Isaiah Jones  You are scheduled for a Cardioversion on Thursday, January 16 with Dr. Michele.  Please arrive at the [facility] (Main Entrance A) at [facility]: [address] [place], [place] [address] at 7:30 am (This time is one hour(s) before your procedure to ensure your preparation).   Free valet parking service is available. You will check in at ADMITTING.   *Please Note: You will receive a call the day before your procedure to confirm the appointment time. That time may have changed from the original time based on the schedule for that day.*    DIET:  Nothing to eat or drink after midnight except a sip of water  with medications (see medication instructions below)  MEDICATION INSTRUCTIONS: !!IF ANY NEW MEDICATIONS ARE STARTED AFTER TODAY, PLEASE NOTIFY YOUR PROVIDER AS SOON AS POSSIBLE!!  FYI: Medications such as Semaglutide  (Ozempic , Wegovy ), Tirzepatide (Mounjaro, Zepbound), Dulaglutide (Trulicity), etc (GLP1 agonists) AND Canagliflozin (Invokana), Dapagliflozin (Farxiga), Empagliflozin  (Jardiance ), Ertugliflozin (Steglatro), Bexagliflozin Arboriculturist) or any combination with one of these drugs such as Invokamet (Canagliflozin/Metformin), Synjardy (Empagliflozin /Metformin), etc (SGLT2 inhibitors) must  be held around the time of a procedure. This is not a comprehensive list of all of these drugs. Please review all of your medications and talk to your provider if you take any one of these. If you are not sure, ask your provider.  HOLD: Semaglutide  (Ozempic , Rybelsus , Wegovy ) for 1 week prior to the procedure. Last dose on  .Don't take Wegovy  after 05/09/2022. HOLD: Empagliflozin  (Jardiance ) for 2 days prior to the procedure. Last dose on Monday, January 13.  Continue taking your anticoagulant (blood thinner): Rivaroxaban  (Xarelto ).  You will need to continue this after your procedure until you are told by your provider that it is safe to stop.    LABS: CBC and BMET today in office.   FYI:  For your safety, and to allow us  to monitor your vital signs accurately during the surgery/procedure we request: If you have artificial nails, gel coating, SNS etc, please have those removed prior to your surgery/procedure. Not having the nail coverings /polish removed may result in cancellation or delay of your surgery/procedure.  Your support person will be asked to wait in the waiting room during your procedure.  It is OK to have someone drop you off and come back when you are ready to be discharged.  You cannot drive after the procedure and will need someone to drive you home.  Bring your insurance cards.  *Special Note: Every effort is made to have your procedure done on time. Occasionally there are emergencies that occur at the hospital that may cause delays. Please be patient if a delay does occur.       Follow-Up:  At [facility], you and your health needs are our priority.  As part of our continuing mission to provide you with exceptional heart care, we have created designated Provider Care Teams.  These Care Teams include your primary Cardiologist (physician) and Advanced Practice Providers (APPs -  Physician Assistants and Nurse Practitioners) who all work together to provide you with the  care you need, when you need it.  We recommend signing up for the patient portal called MyChart.  Sign up information is provided on this After Visit Summary.  MyChart is used to connect with patients for Virtual Visits (Telemedicine).  Patients are able to view lab/test results, encounter notes, upcoming appointments, etc.  Non-urgent messages can be sent to your provider as well.   To learn more about what you can do with MyChart, go to [email or web address].    Your next appointment:   6 week(s)  Provider:   Darryle ONEIDA Decent, MD

## 2023-05-04 LAB — CBC WITH DIFFERENTIAL/PLATELET
Basophils Absolute: 0 10*3/uL (ref 0.0–0.2)
Basos: 1 %
EOS (ABSOLUTE): 0.1 10*3/uL (ref 0.0–0.4)
Eos: 1 %
Hematocrit: 46.8 % (ref 37.5–51.0)
Hemoglobin: 15.6 g/dL (ref 13.0–17.7)
Immature Grans (Abs): 0 10*3/uL (ref 0.0–0.1)
Immature Granulocytes: 0 %
Lymphocytes Absolute: 1.8 10*3/uL (ref 0.7–3.1)
Lymphs: 26 %
MCH: 33.7 pg — ABNORMAL HIGH (ref 26.6–33.0)
MCHC: 33.3 g/dL (ref 31.5–35.7)
MCV: 101 fL — ABNORMAL HIGH (ref 79–97)
Monocytes Absolute: 0.5 10*3/uL (ref 0.1–0.9)
Monocytes: 8 %
Neutrophils Absolute: 4.4 10*3/uL (ref 1.4–7.0)
Neutrophils: 64 %
Platelets: 170 10*3/uL (ref 150–450)
RBC: 4.63 x10E6/uL (ref 4.14–5.80)
RDW: 13 % (ref 11.6–15.4)
WBC: 6.9 10*3/uL (ref 3.4–10.8)

## 2023-05-06 NOTE — Pre-Procedure Instructions (Signed)
 Patient aware to not take [facility] after Wednesday 05/09/23  in preparation for his procedure with anesthesia scheduled 05/17/23.  He will resume after procedure.

## 2023-05-16 ENCOUNTER — Other Ambulatory Visit: Payer: Self-pay | Admitting: Nurse Practitioner

## 2023-05-16 ENCOUNTER — Other Ambulatory Visit: Payer: Self-pay | Admitting: Interventional Cardiology

## 2023-05-16 DIAGNOSIS — I4819 Other persistent atrial fibrillation: Secondary | ICD-10-CM

## 2023-05-16 MED ORDER — ENTRESTO 49-51 MG PO TABS: 1.0000 | ORAL_TABLET | Freq: Two times a day (BID) | ORAL | 3 refills | Status: DC

## 2023-05-16 NOTE — Telephone Encounter (Signed)
 Prescription refill request for Xarelto  received.  Indication:afib Last office visit:1/25 Weight:125  kg Age:73 Scr:0.97  1/25 CrCl:119.92  ml/min  Prescription refilled

## 2023-05-16 NOTE — Progress Notes (Signed)
Unable to reach patient about procedure, but was able to leave a detailed message. Stated that the patient needed to arrive at the hospital at 0730 , remain NPO after 0000, needs to have a ride home and a responsible adult to stay with them for 24 hours after the procedure. Instructed the patient to call back if they had any questions.

## 2023-05-17 ENCOUNTER — Ambulatory Visit (HOSPITAL_COMMUNITY)
Admission: RE | Admit: 2023-05-17 | Discharge: 2023-05-17 | Disposition: A | Discharge status: Home / Self Care | Payer: Medicare Other | Attending: Cardiology | Admitting: Cardiology

## 2023-05-17 ENCOUNTER — Encounter (HOSPITAL_COMMUNITY)
Admission: RE | Disposition: A | Discharge status: Home / Self Care | Payer: Self-pay | Source: Home / Self Care | Attending: Cardiology

## 2023-05-17 ENCOUNTER — Other Ambulatory Visit: Payer: Self-pay

## 2023-05-17 ENCOUNTER — Ambulatory Visit (HOSPITAL_COMMUNITY): Payer: Medicare Other | Admitting: Anesthesiology

## 2023-05-17 ENCOUNTER — Encounter (HOSPITAL_COMMUNITY): Payer: Self-pay | Admitting: Cardiology

## 2023-05-17 DIAGNOSIS — I5022 Chronic systolic (congestive) heart failure: Secondary | ICD-10-CM | POA: Diagnosis not present

## 2023-05-17 DIAGNOSIS — Z79899 Other long term (current) drug therapy: Secondary | ICD-10-CM | POA: Insufficient documentation

## 2023-05-17 DIAGNOSIS — I2510 Atherosclerotic heart disease of native coronary artery without angina pectoris: Secondary | ICD-10-CM | POA: Diagnosis not present

## 2023-05-17 DIAGNOSIS — Z8249 Family history of ischemic heart disease and other diseases of the circulatory system: Secondary | ICD-10-CM | POA: Insufficient documentation

## 2023-05-17 DIAGNOSIS — I110 Hypertensive heart disease with heart failure: Secondary | ICD-10-CM | POA: Diagnosis not present

## 2023-05-17 DIAGNOSIS — I4819 Other persistent atrial fibrillation: Secondary | ICD-10-CM | POA: Diagnosis not present

## 2023-05-17 DIAGNOSIS — I509 Heart failure, unspecified: Secondary | ICD-10-CM

## 2023-05-17 DIAGNOSIS — E785 Hyperlipidemia, unspecified: Secondary | ICD-10-CM | POA: Diagnosis not present

## 2023-05-17 DIAGNOSIS — I447 Left bundle-branch block, unspecified: Secondary | ICD-10-CM | POA: Insufficient documentation

## 2023-05-17 DIAGNOSIS — I4891 Unspecified atrial fibrillation: Secondary | ICD-10-CM

## 2023-05-17 HISTORY — PX: CARDIOVERSION: EP1203

## 2023-05-17 SURGERY — CARDIOVERSION (CATH LAB)
Anesthesia: General

## 2023-05-17 MED ORDER — PROPOFOL 10 MG/ML IV BOLUS
INTRAVENOUS | Status: DC | PRN
  Administered 2023-05-17: 30 mg via INTRAVENOUS
  Administered 2023-05-17: 40 mg via INTRAVENOUS

## 2023-05-17 MED ORDER — PHENYLEPHRINE 80 MCG/ML (10ML) SYRINGE FOR IV PUSH (FOR BLOOD PRESSURE SUPPORT)
PREFILLED_SYRINGE | INTRAVENOUS | Status: DC | PRN
  Administered 2023-05-17: 80 ug via INTRAVENOUS

## 2023-05-17 MED ORDER — LIDOCAINE 2% (20 MG/ML) 5 ML SYRINGE
INTRAMUSCULAR | Status: DC | PRN
  Administered 2023-05-17: 40 mg via INTRAVENOUS

## 2023-05-17 SURGICAL SUPPLY — 1 items: PAD DEFIB RADIO PHYSIO CONN (PAD) ×1 IMPLANT

## 2023-05-17 NOTE — Transfer of Care (Signed)
Immediate Anesthesia Transfer of Care Note  Patient: Isaiah Jones  Procedure(s) Performed: CARDIOVERSION  Patient Location: PACU and Cath Lab  Anesthesia Type:General  Level of Consciousness: awake and patient cooperative  Airway & Oxygen Therapy: Patient Spontanous Breathing and Patient connected to nasal cannula oxygen  Post-op Assessment: Report given to RN and Post -op Vital signs reviewed and stable  Post vital signs: Reviewed and stable  Last Vitals:  Vitals Value Taken Time  BP 133/89   Temp    Pulse 66   Resp 18   SpO2 95   Vitals shown include unfiled device data.  Last Pain:  Vitals:   05/17/23 0736  TempSrc:   PainSc: 0-No pain         Complications: No notable events documented.

## 2023-05-17 NOTE — Progress Notes (Signed)
[place] Rep for abbott at bedside to adjust ppm setting dt pt complaints of "feeling funny". Pt reassessed before discharge and feels ready to go home. Pt discharged in stable condition.

## 2023-05-17 NOTE — Interval H&P Note (Signed)
History and Physical Interval Note:  05/17/2023 7:45 AM  Isaiah Jones  has presented today for surgery, with the diagnosis of AFIB.  The various methods of treatment have been discussed with the patient and family. After consideration of risks, benefits and other options for treatment, the patient has consented to  Procedure(s): CARDIOVERSION (N/A) as a surgical intervention.  The patient's history has been reviewed, patient examined, no change in status, stable for surgery.  I have reviewed the patient's chart and labs.  Questions were answered to the patient's satisfaction.     Isaiah Jones

## 2023-05-17 NOTE — CV Procedure (Signed)
   Electrical Cardioversion Procedure Note Isaiah Jones [id number] [birth year]  Procedure: Electrical Cardioversion Indications:  Atrial Fibrillation  Time Out: Verified patient identification, verified procedure,medications/allergies/relevent history reviewed, required imaging and test results available.  Performed  Procedure Details  The patient signed informed consent.   The patient was NPO past midnight. Has had therapeutic anticoagulation with Xeralto greater than 3 weeks. The patient denies any interruption of anticoagulation.  Anesthesia was administered by Dr. Hart Rochester.  Adequate airway was maintained throughout and vital followed per protocol.  He was cardioverted x 1 with 200J  and x 1 300J of biphasic synchronized energy.  He converted to NSR.  There were no apparent complications.  The patient tolerated the procedure well and had normal neuro status and respiratory status post procedure with vitals stable as recorded elsewhere.     IMPRESSION:  Successful cardioversion of atrial fibrillation   Follow up:  We will be follow as scheduled with his primary cardiologist.  He will continue on current medical therapy.  The patient advised to continue anticoagulation with no interruptions for 4 weeks.  Paulena Servais 05/17/2023, 8:57 AM

## 2023-05-17 NOTE — Anesthesia Preprocedure Evaluation (Addendum)
Anesthesia Evaluation  Patient identified by MRN, date of birth, ID band Patient awake    Reviewed: Allergy & Precautions, NPO status , Patient's Chart, lab work & pertinent test results  History of Anesthesia Complications (+) PONV and history of anesthetic complications  Airway Mallampati: II  TM Distance: >3 FB Neck ROM: Full    Dental  (+) Teeth Intact, Dental Advisory Given   Pulmonary sleep apnea and Continuous Positive Airway Pressure Ventilation , former smoker   breath sounds clear to auscultation       Cardiovascular hypertension, + CAD and +CHF  + dysrhythmias Atrial Fibrillation  Rhythm:Irregular Rate:Abnormal  Echo:  1. Left ventricular ejection fraction, by estimation, is 25 to 30%. The  left ventricle has severely decreased function. The left ventricle  demonstrates global hypokinesis. The left ventricular internal cavity size  was mildly dilated. There is mild left  ventricular hypertrophy. Left ventricular diastolic function could not be  evaluated.   2. Right ventricular systolic function is normal. The right ventricular  size is mildly enlarged. Tricuspid regurgitation signal is inadequate for  assessing PA pressure.   3. Left atrial size was severely dilated.   4. Right atrial size was severely dilated.   5. The mitral valve is normal in structure. Mild mitral valve  regurgitation. No evidence of mitral stenosis.   6. The aortic valve is tricuspid. Aortic valve regurgitation is not  visualized. Aortic valve sclerosis is present, with no evidence of aortic  valve stenosis.   7. The inferior vena cava is normal in size with greater than 50%  respiratory variability, suggesting right atrial pressure of 3 mmHg.     Neuro/Psych  PSYCHIATRIC DISORDERS Anxiety Depression    negative neurological ROS     GI/Hepatic Neg liver ROS,GERD  ,,  Endo/Other  negative endocrine ROS    Renal/GU Renal disease      Musculoskeletal  (+) Arthritis ,    Abdominal   Peds  Hematology negative hematology ROS (+)   Anesthesia Other Findings   Reproductive/Obstetrics                             Anesthesia Physical Anesthesia Plan  ASA: 4  Anesthesia Plan: General   Post-op Pain Management:    Induction: Intravenous  PONV Risk Score and Plan: 0  Airway Management Planned: Natural Airway and Simple Face Mask  Additional Equipment: None  Intra-op Plan:   Post-operative Plan:   Informed Consent: I have reviewed the patients History and Physical, chart, labs and discussed the procedure including the risks, benefits and alternatives for the proposed anesthesia with the patient or authorized representative who has indicated his/her understanding and acceptance.       Plan Discussed with: CRNA  Anesthesia Plan Comments:        Anesthesia Quick Evaluation

## 2023-05-17 NOTE — Anesthesia Postprocedure Evaluation (Signed)
Anesthesia Post Note  Patient: Isaiah Jones  Procedure(s) Performed: CARDIOVERSION     Patient location during evaluation: PACU Anesthesia Type: General Level of consciousness: awake and alert Pain management: pain level controlled Vital Signs Assessment: post-procedure vital signs reviewed and stable Respiratory status: spontaneous breathing, nonlabored ventilation, respiratory function stable and patient connected to nasal cannula oxygen Cardiovascular status: blood pressure returned to baseline and stable Postop Assessment: no apparent nausea or vomiting Anesthetic complications: no  No notable events documented.  Last Vitals:  Vitals:   05/17/23 0905 05/17/23 0910  BP: 114/82 109/79  Pulse: 72 70  Resp: 19 (!) 21  Temp:    SpO2: 95% 93%    Last Pain:  Vitals:   05/17/23 0910  TempSrc:   PainSc: 0-No pain                 Shelton Silvas

## 2023-05-22 ENCOUNTER — Other Ambulatory Visit: Payer: Self-pay | Admitting: Cardiovascular Disease

## 2023-05-22 DIAGNOSIS — I4819 Other persistent atrial fibrillation: Secondary | ICD-10-CM

## 2023-05-23 ENCOUNTER — Telehealth: Payer: Self-pay | Admitting: Pharmacist

## 2023-05-23 MED ORDER — RIVAROXABAN 20 MG PO TABS: 20.0000 mg | ORAL_TABLET | Freq: Every day | ORAL | 0 refills | Status: DC

## 2023-05-23 NOTE — Telephone Encounter (Signed)
Received notice that patient's pharmacy is out of stock on Xarelto. Called patient and lmom

## 2023-05-23 NOTE — Telephone Encounter (Signed)
Spoke with patient on phone. He asked if he was to continue on amiodarone since he had a cardioversion on 1/16. Advised I would route to Dr Flora Lipps

## 2023-05-25 ENCOUNTER — Telehealth: Payer: Self-pay

## 2023-05-25 NOTE — Telephone Encounter (Signed)
*  Primary  Pharmacy Patient Advocate Encounter   Received notification from Fax that prior authorization for [facility] 2.4MG /0.75ML auto-injectors  is required/requested.   Insurance verification completed.   The patient is insured through [facility] .   Per test claim: PA required; PA started via CoverMyMeds. KEY [id number] . Please see clinical question(s) below that I am not finding the answer to in her chart and advise.   Please provide medical justification for the non-formulary exception request. Please address why all formulary alternatives on any tier of the formulary for the treatment of the same condition would not be effective or would cause adverse effects. List previous drugs and doses attempted for this patient, condition and dates or approximate dates or duration of treatment (if known). Document adverse effects requiring discontinuation and/or reason for perceived ineffectiveness.*

## 2023-06-06 ENCOUNTER — Telehealth: Payer: Self-pay | Admitting: Internal Medicine

## 2023-06-06 NOTE — Telephone Encounter (Signed)
Patient would like someone to give him a call regarding his [facility] request.   Thank you  Gabriel Cirri [facility] AWV TEAM Direct Dial [phone]

## 2023-06-11 NOTE — Telephone Encounter (Addendum)
 Has there been an update on patient's wegovy  request?

## 2023-06-11 NOTE — Progress Notes (Deleted)
  Cardiology Office Note:  .   Date:  06/11/2023  ID:  Isaiah Jones, DOB 02-23-1951, MRN [id number] PCP: Lula Olszewski, MD  [facility] Providers Cardiologist:  Reatha Harps, MD { Click to update primary MD,subspecialty MD or APP then REFRESH:1}   History of Present Illness: .   No chief complaint on file.   Isaiah Jones is a 73 y.o. male with history of CHF, persistent Afib, LBBB, HLD, CAD who presents for follow-up.      Problem List Systolic HF s/p CRT-D -2/2 Afib/LBBB? 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 Persistent Afib  4. LBBB -QRS 154 ms 5. HLD -T chol 115, HDL 49, LDL 48, TG  90 -A1c 5.5    ROS: All other ROS reviewed and negative. Pertinent positives noted in the HPI.     Studies Reviewed: Marland Kitchen        TTE 02/22/2023  1. Left ventricular ejection fraction, by estimation, is 25 to 30%. The  left ventricle has severely decreased function. The left ventricle  demonstrates global hypokinesis. The left ventricular internal cavity size  was mildly dilated. There is mild left  ventricular hypertrophy. Left ventricular diastolic function could not be  evaluated.   2. Right ventricular systolic function is normal. The right ventricular  size is mildly enlarged. Tricuspid regurgitation signal is inadequate for  assessing PA pressure.   3. Left atrial size was severely dilated.   4. Right atrial size was severely dilated.   5. The mitral valve is normal in structure. Mild mitral valve  regurgitation. No evidence of mitral stenosis.   6. The aortic valve is tricuspid. Aortic valve regurgitation is not  visualized. Aortic valve sclerosis is present, with no evidence of aortic  valve stenosis.   7. The inferior vena cava is normal in size with greater than 50%  respiratory variability, suggesting right atrial pressure of 3 mmHg.  Physical Exam:   VS:   There were no vitals taken for this visit.   Wt Readings from Last 3 Encounters:  05/17/23 263 lb (119.3 kg)  05/03/23 275 lb 9.6 oz (125 kg)  03/27/23 276 lb 9.6 oz (125.5 kg)    GEN: Well nourished, well developed in no acute distress NECK: No JVD; No carotid bruits CARDIAC: ***RRR, no murmurs, rubs, gallops RESPIRATORY:  Clear to auscultation without rales, wheezing or rhonchi  ABDOMEN: Soft, non-tender, non-distended EXTREMITIES:  No edema; No deformity  ASSESSMENT AND PLAN: .   ***    {Are you ordering a CV Procedure (e.g. stress test, cath, DCCV, TEE, etc)?   Press F2        :[id number]}   Follow-up: No follow-ups on file.  Time Spent with Patient: I have spent a total of *** minutes caring for this patient today face to face, ordering and reviewing labs/tests, reviewing prior records/medical history, examining the patient, establishing an assessment and plan, communicating results/findings to the patient/family, and documenting in the medical record.   Signed, Lenna Gilford. Flora Lipps, MD, [facility]  [facility]  [address] [place], [place] [address] [phone]  8:26 PM

## 2023-06-12 ENCOUNTER — Telehealth: Payer: Self-pay

## 2023-06-12 ENCOUNTER — Other Ambulatory Visit (HOSPITAL_COMMUNITY): Payer: Self-pay

## 2023-06-12 NOTE — Telephone Encounter (Signed)
Noted

## 2023-06-12 NOTE — Telephone Encounter (Signed)
Pharmacy Patient Advocate Encounter   Received notification from Pt Calls Messages that prior authorization for [facility] 2.4MG /0.75ML auto-injectors is required/requested.   Insurance verification completed.   The patient is insured through [facility] .   Per test claim: PA required; PA submitted to above mentioned insurance via CoverMyMeds Key/confirmation #/EOC [id number] Status is pending

## 2023-06-12 NOTE — Telephone Encounter (Signed)
Apparently, the PA request conversation was routed to the wrong person instead of to me. I have messaged the prior authorization team to find out the status. Also, I have sent patient a my chart message concerning this.

## 2023-06-12 NOTE — Telephone Encounter (Signed)
PA request has been Submitted. New Encounter created for follow up. For additional info see Pharmacy Prior Auth telephone encounter from 06/12/23.

## 2023-06-13 ENCOUNTER — Ambulatory Visit: Payer: Medicare Other | Admitting: Cardiovascular Disease

## 2023-06-13 DIAGNOSIS — I5022 Chronic systolic (congestive) heart failure: Secondary | ICD-10-CM

## 2023-06-13 DIAGNOSIS — E785 Hyperlipidemia, unspecified: Secondary | ICD-10-CM

## 2023-06-13 DIAGNOSIS — I10 Essential (primary) hypertension: Secondary | ICD-10-CM

## 2023-06-13 DIAGNOSIS — I4819 Other persistent atrial fibrillation: Secondary | ICD-10-CM

## 2023-06-13 DIAGNOSIS — I2510 Atherosclerotic heart disease of native coronary artery without angina pectoris: Secondary | ICD-10-CM

## 2023-06-13 DIAGNOSIS — I447 Left bundle-branch block, unspecified: Secondary | ICD-10-CM

## 2023-06-15 NOTE — Telephone Encounter (Signed)
Received a fax from [employer] for additional information for the prior auth on Wegovy.   Faxed to [phone]

## 2023-06-15 NOTE — Telephone Encounter (Signed)
Faxed additional info to [employer] [employer]

## 2023-06-15 NOTE — Telephone Encounter (Signed)
Pharmacy Patient Advocate Encounter  Received notification from [facility] that Prior Authorization for [facility] 2.4MG /0.75ML auto-injectors has been DENIED.  See denial reason below. No denial letter attached in CMM. Will attach denial letter to Media tab once received.   PA #/Case ID/Reference #: [id number]

## 2023-06-17 NOTE — Progress Notes (Unsigned)
Cardiology Office Note:  .   Date:  06/19/2023  ID:  Isaiah Jones, DOB 02-13-1951, MRN [id number] PCP: Lula Olszewski, MD  [facility] Providers Cardiologist:  Reatha Harps, MD }   History of Present Illness: .   Isaiah Jones is a 73 y.o. male e with a history of systolic heart failure, atrial fibrillation, left bundle branch block, and nonobstructive coronary artery disease, presents for a follow-up visit after recent CRTD implantation. Despite the CRTD, his ejection fraction has not improved. He reports a new sensation of his heart pounding, particularly in his back, which he first noticed on Christmas Eve.  When last seen by Dr. Bufford Buttner on 05/03/2023 patient was found to be rapid atrial fibrillation on PPM interrogation.  The patient was planned for outpatient DCCV.  He had successful DCCV by Dr.Tobb on 05/17/2023, he was to continue Xarelto as directed for minimum of 4 weeks.  He is here for postprocedure follow-up.  He comes today feeling very well.  He denies any shortness of breath chest pain or palpitations.  He is cardiac unaware.  He states the only time he feels like he may go into A-fib this when he has some sweating sensations.  He has not had any of that occur since his cardioversion.   ROS: As above otherwise negative.  I wonder how many patients will show  Studies Reviewed: Marland Kitchen   TTE 02/22/2023  1. Left ventricular ejection fraction, by estimation, is 25 to 30%. The  left ventricle has severely decreased function. The left ventricle  demonstrates global hypokinesis. The left ventricular internal cavity size  was mildly dilated. There is mild left  ventricular hypertrophy. Left ventricular diastolic function could not be  evaluated.   2. Right ventricular systolic function is normal. The right ventricular  size is mildly enlarged. Tricuspid regurgitation signal is inadequate for  assessing PA pressure.   3. Left atrial size was severely dilated.   4. Right  atrial size was severely dilated.   5. The mitral valve is normal in structure. Mild mitral valve  regurgitation. No evidence of mitral stenosis.   6. The aortic valve is tricuspid. Aortic valve regurgitation is not  visualized. Aortic valve sclerosis is present, with no evidence of aortic  valve stenosis.   7. The inferior vena cava is normal in size with greater than 50%  respiratory variability, suggesting right atrial pressure of 3 mmHg.    EKG Interpretation Date/Time:  Tuesday June 19 2023 08:29:45 EST Ventricular Rate:  76 PR Interval:  116 QRS Duration:  150 QT Interval:  450 QTC Calculation: 506 R Axis:   -65  Text Interpretation: AV dual-paced rhythm with occasional Premature ventricular complexes When compared with ECG of 17-May-2023 08:49, Premature ventricular complexes are now Present Vent. rate has increased BY   6 BPM Confirmed by Joni Reining [id number]) on 06/19/2023 8:54:53 AM    Physical Exam:   VS:  BP 119/73   Pulse 76   Ht 6\' 2"  (1.88 m)   Wt 274 lb 9.6 oz (124.6 kg)   SpO2 98%   BMI 35.26 kg/m    Wt Readings from Last 3 Encounters:  06/19/23 274 lb 9.6 oz (124.6 kg)  05/17/23 263 lb (119.3 kg)  05/03/23 275 lb 9.6 oz (125 kg)    GEN: Well nourished, well developed in no acute distress NECK: No JVD; No carotid bruits CARDIAC: RRR, no murmurs, rubs, gallops RESPIRATORY:  Clear to auscultation without rales, wheezing  or rhonchi  ABDOMEN: Soft, non-tender, non-distended EXTREMITIES:  No edema; No deformity   ASSESSMENT AND PLAN: .    PAF: He is status post DCCV which was successful.  He remains in normal sinus rhythm.  Continue anticoagulation therapy with Xarelto, heart rate control with amiodarone, he does have a follow-up appoint with Dr. Elberta Fortis in 3 days which she is asked to reschedule due to snowy weather.  This has been completed for him today.  Medication refills are provided through [employer] on amiodarone and Jardiance.  He is looking  forward to warmer weather where he can begin to ride his bicycle.  He states he always feels better on this exercise regimen and wants to strengthen his heart.  Will need to repeat echocardiogram on next office visit.  2.  Chronic systolic heart failure: He remains on GDMT with Entresto, spironolactone, chart dance, metoprolol.  He states he feels great has no complaints of any shortness of breath or weight gain.  Feeling better since DCCV.  Continue current medication regimen low-sodium diet and daily weight.  3.  Hypercholesterolemia: Remains on Repatha.  Most recent labs on 03/27/2023 total cholesterol 115, LDL 40, HDL 49.3.  Continue as directed.  Good control.  4. ICD in situ:  Followed by Dr. Elberta Fortis          Signed, Bettey Mare. Liborio Nixon, ANP, AACC

## 2023-06-18 ENCOUNTER — Encounter: Payer: Self-pay | Admitting: Internal Medicine

## 2023-06-18 ENCOUNTER — Ambulatory Visit (INDEPENDENT_AMBULATORY_CARE_PROVIDER_SITE_OTHER): Payer: Medicare Other

## 2023-06-18 DIAGNOSIS — I110 Hypertensive heart disease with heart failure: Secondary | ICD-10-CM

## 2023-06-18 DIAGNOSIS — I447 Left bundle-branch block, unspecified: Secondary | ICD-10-CM

## 2023-06-18 DIAGNOSIS — I5022 Chronic systolic (congestive) heart failure: Secondary | ICD-10-CM

## 2023-06-18 DIAGNOSIS — I2510 Atherosclerotic heart disease of native coronary artery without angina pectoris: Secondary | ICD-10-CM

## 2023-06-18 DIAGNOSIS — I5189 Other ill-defined heart diseases: Secondary | ICD-10-CM

## 2023-06-18 DIAGNOSIS — R638 Other symptoms and signs concerning food and fluid intake: Secondary | ICD-10-CM

## 2023-06-19 ENCOUNTER — Encounter: Payer: Self-pay | Admitting: Adult Health

## 2023-06-19 ENCOUNTER — Ambulatory Visit: Payer: Medicare Other | Attending: Adult Health | Admitting: Adult Health

## 2023-06-19 VITALS — BP 119/73 | HR 76 | Ht 74.0 in | Wt 274.6 lb

## 2023-06-19 DIAGNOSIS — I480 Paroxysmal atrial fibrillation: Secondary | ICD-10-CM | POA: Diagnosis not present

## 2023-06-19 DIAGNOSIS — I5022 Chronic systolic (congestive) heart failure: Secondary | ICD-10-CM

## 2023-06-19 DIAGNOSIS — E7800 Pure hypercholesterolemia, unspecified: Secondary | ICD-10-CM

## 2023-06-19 DIAGNOSIS — Z95810 Presence of automatic (implantable) cardiac defibrillator: Secondary | ICD-10-CM

## 2023-06-19 MED ORDER — AMIODARONE HCL 200 MG PO TABS: 200.0000 mg | ORAL_TABLET | Freq: Every day | ORAL | 3 refills | Status: DC

## 2023-06-19 MED ORDER — EMPAGLIFLOZIN 10 MG PO TABS: 10.0000 mg | ORAL_TABLET | Freq: Every day | ORAL | 3 refills | Status: DC

## 2023-06-19 NOTE — Patient Instructions (Addendum)
Medication Instructions:  Your physician recommends that you continue on your current medications as directed. Please refer to the Current Medication list given to you today.  *If you need a refill on your cardiac medications before your next appointment, please call your pharmacy*   Lab Work: None ordered If you have labs (blood work) drawn today and your tests are completely normal, you will receive your results only by: MyChart Message (if you have MyChart) OR A paper copy in the mail If you have any lab test that is abnormal or we need to change your treatment, we will call you to review the results.   Testing/Procedures: None ordered   Follow-Up: At [facility], you and your health needs are our priority.  As part of our continuing mission to provide you with exceptional heart care, we have created designated Provider Care Teams.  These Care Teams include your primary Cardiologist (physician) and Advanced Practice Providers (APPs -  Physician Assistants and Nurse Practitioners) who all work together to provide you with the care you need, when you need it.  We recommend signing up for the patient portal called "MyChart".  Sign up information is provided on this After Visit Summary.  MyChart is used to connect with patients for Virtual Visits (Telemedicine).  Patients are able to view lab/test results, encounter notes, upcoming appointments, etc.  Non-urgent messages can be sent to your provider as well.   To learn more about what you can do with MyChart, go to [email or web address].    Your next appointment:   3 month(s)  The format for your next appointment:   In Person  Provider:   Reatha Harps, MD     Thank you for choosing [facility]!!   [phone]  Other Instructions  We got you rescheduled to see Francis Dowse on 2/25 @ 8:00 am

## 2023-06-20 ENCOUNTER — Telehealth: Payer: Self-pay

## 2023-06-20 LAB — CUP PACEART REMOTE DEVICE CHECK
Battery Remaining Longevity: 85 mo
Battery Remaining Percentage: 92 %
Battery Voltage: 2.96 V
Brady Statistic AP VP Percent: 95 %
Brady Statistic AP VS Percent: 2.3 %
Brady Statistic AS VP Percent: 1 %
Brady Statistic AS VS Percent: 1 %
Brady Statistic RA Percent Paced: 94 %
Date Time Interrogation Session: 20250218132828
HighPow Impedance: 74 Ohm
Implantable Lead Connection Status: 753985
Implantable Lead Connection Status: 753985
Implantable Lead Connection Status: 753985
Implantable Lead Implant Date: 20241115
Implantable Lead Implant Date: 20241115
Implantable Lead Implant Date: 20241115
Implantable Lead Location: 753858
Implantable Lead Location: 753859
Implantable Lead Location: 753860
Implantable Pulse Generator Implant Date: 20241115
Lead Channel Impedance Value: 450 Ohm
Lead Channel Impedance Value: 540 Ohm
Lead Channel Impedance Value: 910 Ohm
Lead Channel Pacing Threshold Amplitude: 0.5 V
Lead Channel Pacing Threshold Amplitude: 0.5 V
Lead Channel Pacing Threshold Amplitude: 1 V
Lead Channel Pacing Threshold Pulse Width: 0.5 ms
Lead Channel Pacing Threshold Pulse Width: 0.5 ms
Lead Channel Pacing Threshold Pulse Width: 1 ms
Lead Channel Sensing Intrinsic Amplitude: 12 mV
Lead Channel Sensing Intrinsic Amplitude: 4.2 mV
Lead Channel Setting Pacing Amplitude: 1.5 V
Lead Channel Setting Pacing Amplitude: 1.5 V
Lead Channel Setting Pacing Amplitude: 2.5 V
Lead Channel Setting Pacing Pulse Width: 0.5 ms
Lead Channel Setting Pacing Pulse Width: 0.5 ms
Lead Channel Setting Sensing Sensitivity: 0.5 mV
Pulse Gen Serial Number: 810108245
Zone Setting Status: 755011

## 2023-06-20 NOTE — Telephone Encounter (Signed)
Copied from CRM [id number]. Topic: Clinical - Prescription Issue >> Jun 20, 2023 12:45 PM Martinique E wrote: Reason for CRM: Patient called in stating that he received a message from his pharmacy that his Reginal Lutes was approved for 0.25mg  when he has been on 2.4mg . Patient is wondering if this [facility] prescription can go back to the 2.4mg .  Please see pt call in regards to [facility] prescription. Not seeing Wegovy on pt active med list in chart. Please advise correct dose and I'll be happy to send to the pharmacy.

## 2023-06-21 ENCOUNTER — Ambulatory Visit: Payer: Medicare Other | Admitting: Physician Assistant

## 2023-06-21 ENCOUNTER — Telehealth: Payer: Self-pay

## 2023-06-21 ENCOUNTER — Other Ambulatory Visit (HOSPITAL_COMMUNITY): Payer: Self-pay

## 2023-06-21 MED ORDER — WEGOVY 2.4 MG/0.75ML ~~LOC~~ SOAJ: 2.4000 mg | SUBCUTANEOUS | 11 refills | Status: DC

## 2023-06-21 NOTE — Telephone Encounter (Signed)
Pharmacy Patient Advocate Encounter  Received notification from [facility] that Prior Authorization for [facility] (all strengths) has been APPROVED from 05/02/2023 to 02/18/20256. Ran test claim, Copay is $30.00. This test claim was processed through [facility]- copay amounts may vary at other pharmacies due to pharmacy/plan contracts, or as the patient moves through the different stages of their insurance plan.   PA #/Case ID/Reference #: Not provided, please see approval letter attached to pt's media

## 2023-06-21 NOTE — Progress Notes (Signed)
 Cardiology Office Note:  .   Date:  06/21/2023  ID:  Isaiah Jones, DOB 22-Dec-1950, MRN [id number] PCP: Lula Olszewski, MD  [facility] Providers Cardiologist:  Reatha Harps, MD {  History of Present Illness: .   Isaiah Jones is a 73 y.o. male w/PMHx of HTN, HLD, AFib, NICM, chronic CHF  Referred to Dr. Elberta Fortis for consideration for ICD, saw him Oct 2024, planned for CRT-D implant  Implanted Nov 2024  AFib w/RVR > amiodarone started Jan 2025 > DCCV successful 05/17/23  Today's visit is scheduled as his 91 day post implant visit  ROS:   He is doing well Thinks post implant he may feel like he has better energy No CP, palpitations No SOB No near syncope or syncope No bleeding or signs of bleeding Reports excellent CPAP compliance, occ sats dip while asleep, his machine wakes him if gets into the 80's   Device information Abbott CRT-D implanted 03/16/2023  Arrhythmia/AAD hx AFib found 2018 Amiodarone started Jan 2025 (noted AFib w/RVR)   Studies Reviewed: Marland Kitchen    EKG done today and reviewed by myself (post LV output adjustment) AV paced, RBBB morphology though is   DEVICE interrogation done today and reviewed by myself  Battery is good RA/RV lead measurements stable LV lead threshold today is higher then his programmed output Threshold today is 1.75 > output was at 1.5 No arrhythmias Output on the LV lead increased to 2.5V  >>>   Ultimately patient brought back in after his CXR and optimization performed with industry support  Implant record reports LV 1-2 vector  comes in today on 1-4 (UNKNOWN when that was changed) LV opt run No stim noted on any vectors LV 1-2 best for threshold 1.5/0.5 EKG opt  LV 1-2 and simultaneous gave the best QRS morphology PVCs noted intermittently this morning and afternoon (2.5% burden) Base pacing rate >  80 suppressed them well LV output left at 2.0/0.5    02/22/23: TTE 1. Left ventricular ejection  fraction, by estimation, is 25 to 30%. The  left ventricle has severely decreased function. The left ventricle  demonstrates global hypokinesis. The left ventricular internal cavity size  was mildly dilated. There is mild left  ventricular hypertrophy. Left ventricular diastolic function could not be  evaluated.   2. Right ventricular systolic function is normal. The right ventricular  size is mildly enlarged. Tricuspid regurgitation signal is inadequate for  assessing PA pressure.   3. Left atrial size was severely dilated.   4. Right atrial size was severely dilated.   5. The mitral valve is normal in structure. Mild mitral valve  regurgitation. No evidence of mitral stenosis.   6. The aortic valve is tricuspid. Aortic valve regurgitation is not  visualized. Aortic valve sclerosis is present, with no evidence of aortic  valve stenosis.   7. The inferior vena cava is normal in size with greater than 50%  respiratory variability, suggesting right atrial pressure of 3 mmHg.     Cath in 09/22/22 "Prox RCA lesion is 50% stenosed.  RFR 0.99.  Not significant.   Ost Cx to Prox Cx lesion is 25% stenosed.   1st Diag lesion is 50% stenosed.   Mid LAD lesion is 25% stenosed.   There is moderate to severe left ventricular systolic dysfunction.   LV end diastolic pressure is normal.   The left ventricular ejection fraction is 30-35% by visual estimate.   There is no aortic valve stenosis.  Aortic saturation 91%, PA saturation 72%, mean RA pressure 4 mmHg, PA pressure 21/12, mean PA pressure 17 mmHg, mean pulmonary capillary wedge pressure 8 mmHg, cardiac output 8.15 L/min, cardiac index 3.18.   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  Normal right heart pressures.  Continue aggressive medical therapy for LV dysfunction, atrial fibrillation and coronary artery disease.  If he does develop significant heart failure symptoms, would have to consider biventricular pacing given his  left bundle branch block."   04/17/2022: TTE IMPRESSIONS 1. Left ventricular ejection fraction, by estimation, is 25 to 30%. The left ventricle has severely decreased function. The left ventricle demonstrates global hypokinesis. The left ventricular internal cavity size was mildly dilated. There is mild concentric left ventricular hypertrophy. Left ventricular diastolic function could not be evaluated. 2. Right ventricular systolic function is normal. The right ventricular size is mildly enlarged. 3. Left atrial size was severely dilated. 4. Right atrial size was severely dilated. 5. The mitral valve is normal in structure. Mild mitral valve regurgitation. No evidence of mitral stenosis. 6. The aortic valve is tricuspid. Aortic valve regurgitation is not visualized. No aortic stenosis is present. 7. Aortic dilatation noted. There is mild dilatation of the aortic root, measuring 41 mm. 8. The inferior vena cava is normal in size with greater than 50% respiratory variability, suggesting right atrial pressure of 3 mmHg.   Risk Assessment/Calculations:    Physical Exam:   VS:  There were no vitals taken for this visit.   Wt Readings from Last 3 Encounters:  06/19/23 274 lb 9.6 oz (124.6 kg)  05/17/23 263 lb (119.3 kg)  05/03/23 275 lb 9.6 oz (125 kg)    GEN: Well nourished, well developed in no acute distress NECK: No JVD; No carotid bruits CARDIAC: RRR, no murmurs, rubs, gallops RESPIRATORY:  CTA b/l without rales, wheezing or rhonchi  ABDOMEN: Soft, non-tender, non-distended EXTREMITIES:  No edema; No deformity   ICD site: is stable, no thinning, fluctuation, tethering  ASSESSMENT AND PLAN: .    CRT-D Findings as above  AFTER the patient left: Noted that prior testing thresholds were run at 1.34ms and better Today was run at 0.74ms PW CXR by my eye appears slightly pulled back then implant day I spoke to the patient, feel like we could try thresholds at different PW Might try  to optimize if we can as well try and narrow in his QRS   This afternoon Base EKG with wide RBBB 172bpm >> post change LV 1-2 and simultaneous was 116-    Persistent  AFib CHA2DS2Vasc is 3, on Xarelto, appropriately dosed zero Burden post DCCV Amiodarone Labs today   NICM Chronic CHF 95 %BP pacing CorVue looks good C/w Dr. Bobbye Riggs   Secondary hypercoagulable state 2/2 AFib    Dispo: will keep his f/u 2 mo, sooner if needed  Signed, Sheilah Pigeon, PA-C

## 2023-06-21 NOTE — Telephone Encounter (Signed)
Copied from CRM [id number]. Topic: General - Call Back - No Documentation >> Jun 20, 2023  1:44 PM Kathryne Eriksson wrote: Reason for CRM: Returning Office Call >> Jun 20, 2023  1:50 PM Kathryne Eriksson wrote: Patient states he just missed a call from Dr. Jon Billings office and wanted to let him know that his last Wegovy injection was on the 22nd, as well as he wants to remain on the 2.4MG  dosage. Patient states if he has any other questions then to give him another call back.   Please see previous Telephone Encounter on this pt's request.

## 2023-06-22 NOTE — Telephone Encounter (Signed)
Spoke with patient and he informed me that he last had a injection on last Saturday. Also, he is approved for all dosages of Wegovy. He is aware of this information.

## 2023-06-22 NOTE — Telephone Encounter (Signed)
Sent my chart message informing patient of this approval.

## 2023-06-22 NOTE — Telephone Encounter (Signed)
He is on Wegovy 2.4 mg and wants to stay on that dosage.

## 2023-06-26 ENCOUNTER — Ambulatory Visit: Payer: Medicare Other | Attending: Physician Assistant | Admitting: Physician Assistant

## 2023-06-26 ENCOUNTER — Ambulatory Visit
Admission: RE | Admit: 2023-06-26 | Discharge: 2023-06-26 | Disposition: A | Discharge status: Home / Self Care | Payer: Medicare Other | Source: Ambulatory Visit | Attending: Physician Assistant

## 2023-06-26 ENCOUNTER — Encounter: Payer: Self-pay | Admitting: Physician Assistant

## 2023-06-26 VITALS — BP 118/76 | HR 71 | Ht 74.0 in | Wt 270.8 lb

## 2023-06-26 DIAGNOSIS — I428 Other cardiomyopathies: Secondary | ICD-10-CM | POA: Diagnosis not present

## 2023-06-26 DIAGNOSIS — Z95810 Presence of automatic (implantable) cardiac defibrillator: Secondary | ICD-10-CM

## 2023-06-26 DIAGNOSIS — Z79899 Other long term (current) drug therapy: Secondary | ICD-10-CM | POA: Diagnosis not present

## 2023-06-26 DIAGNOSIS — I5022 Chronic systolic (congestive) heart failure: Secondary | ICD-10-CM

## 2023-06-26 DIAGNOSIS — I4819 Other persistent atrial fibrillation: Secondary | ICD-10-CM

## 2023-06-26 DIAGNOSIS — I480 Paroxysmal atrial fibrillation: Secondary | ICD-10-CM

## 2023-06-26 LAB — CUP PACEART INCLINIC DEVICE CHECK
Battery Remaining Longevity: 80 mo
Brady Statistic RA Percent Paced: 95 %
Brady Statistic RV Percent Paced: 97 %
Date Time Interrogation Session: 20250225181407
Implantable Lead Connection Status: 753985
Implantable Lead Connection Status: 753985
Implantable Lead Connection Status: 753985
Implantable Lead Implant Date: 20241115
Implantable Lead Implant Date: 20241115
Implantable Lead Implant Date: 20241115
Implantable Lead Location: 753858
Implantable Lead Location: 753859
Implantable Lead Location: 753860
Implantable Pulse Generator Implant Date: 20241115
Lead Channel Impedance Value: 787.5 Ohm
Lead Channel Pacing Threshold Amplitude: 0.5 V
Lead Channel Pacing Threshold Amplitude: 1.5 V
Lead Channel Pacing Threshold Amplitude: 1.5 V
Lead Channel Pacing Threshold Pulse Width: 0.5 ms
Lead Channel Pacing Threshold Pulse Width: 0.5 ms
Lead Channel Pacing Threshold Pulse Width: 0.5 ms
Lead Channel Sensing Intrinsic Amplitude: 10 mV
Lead Channel Sensing Intrinsic Amplitude: 3.6 mV
Lead Channel Setting Pacing Amplitude: 1.5 V
Lead Channel Setting Pacing Amplitude: 2 V
Lead Channel Setting Pacing Amplitude: 2.5 V
Lead Channel Setting Pacing Pulse Width: 0.5 ms
Lead Channel Setting Pacing Pulse Width: 0.5 ms
Lead Channel Setting Sensing Sensitivity: 0.5 mV
Pulse Gen Serial Number: 810108245
Zone Setting Status: 755011

## 2023-06-26 LAB — HEPATIC FUNCTION PANEL
ALT: 23 [IU]/L (ref 0–44)
AST: 30 [IU]/L (ref 0–40)
Albumin: 4.3 g/dL (ref 3.8–4.8)
Alkaline Phosphatase: 66 [IU]/L (ref 44–121)
Bilirubin Total: 0.8 mg/dL (ref 0.0–1.2)
Bilirubin, Direct: 0.34 mg/dL (ref 0.00–0.40)
Total Protein: 6.4 g/dL (ref 6.0–8.5)

## 2023-06-26 LAB — TSH: TSH: 2.58 u[IU]/mL (ref 0.450–4.500)

## 2023-06-26 MED ORDER — AMIODARONE HCL 200 MG PO TABS: 200.0000 mg | ORAL_TABLET | Freq: Every day | ORAL | 3 refills | Status: AC

## 2023-06-26 NOTE — Patient Instructions (Addendum)
 Medication Instructions:   START TAKING : AMIODARONE 200 MG ONCE A DAY   *If you need a refill on your cardiac medications before your next appointment, please call your pharmacy*   Lab Work:   PLEASE GO DOWN STAIRS  [facility]  FIRST FLOOR  [address] ( GET OFF ELEVATORS MAKE A LEFT AND ANOTHER LEFT LAB ON RIGHT DOWN HALLWAY :  LIVER ( LFT) AND THYROID  (TSH)  LABS TODAY     If you have labs (blood work) drawn today and your tests are completely normal, you will receive your results only by: MyChart Message (if you have MyChart) OR A paper copy in the mail If you have any lab test that is abnormal or we need to change your treatment, we will call you to review the results.   Testing/Procedures: A chest x-ray takes a picture of the organs and structures inside the chest, including the heart, lungs, and blood vessels. This test can show several things, including, whether the heart is enlarges; whether fluid is building up in the lungs; and whether pacemaker / defibrillator leads are still in place.  Address: [address] [place], [place], [place] [address] Phone: [phone] Hours:  Monday 6:30?AM-7?PM Tuesday 6:30?AM-7?PM Wednesday 6:30?AM-7?PM Thursday 6:30?AM-7?PM Friday 6:30?AM-7?PM Saturday 6:30?AM-7?PM Sunday 6:30?AM-7?PM    Follow-Up: At [facility], you and your health needs are our priority.  As part of our continuing mission to provide you with exceptional heart care, we have created designated Provider Care Teams.  These Care Teams include your primary Cardiologist (physician) and Advanced Practice Providers (APPs -  Physician Assistants and Nurse Practitioners) who all work together to provide you with the care you need, when you need it.  We recommend signing up for the patient portal called "MyChart".  Sign up information is provided on this After Visit Summary.  MyChart is used to connect with patients for Virtual Visits (Telemedicine).  Patients are able to view  lab/test results, encounter notes, upcoming appointments, etc.  Non-urgent messages can be sent to your provider as well.   To learn more about what you can do with MyChart, go to [email or web address].    Your next appointment:    3 week(s)( CONTACT  CASSIE HALL/ ANGELINE HAMMER FOR EP SCHEDULING ISSUES )   Provider:    You may see [facility]  or one of the following Advanced Practice Providers on your designated Care Team:   Francis Dowse, [place] Casimiro Needle "Mardelle Matte" [place], [place] Canary Brim, NP    Other Instructions    1st Floor: - Lobby - Registration  - Pharmacy  - Lab - Cafe  2nd Floor: - PV Lab - Diagnostic Testing (echo, CT, nuclear med)  3rd Floor: - Vacant  4th Floor: - TCTS (cardiothoracic surgery) - AFib Clinic - Structural Heart Clinic - Vascular Surgery  - Vascular Ultrasound  5th Floor: - [facility] (general and EP) - Clinical Pharmacy for coumadin, hypertension, lipid, weight-loss medications, and med management appointments    Valet parking services will be available as well.

## 2023-07-15 NOTE — Progress Notes (Unsigned)
 Electrophysiology Office Note:   Date:  07/17/2023  ID:  Isaiah Jones, DOB 12-May-1950, MRN [id number]  Primary Cardiologist: Reatha Harps, MD Primary Heart Failure: None Electrophysiologist: Will Jorja Loa, MD       History of Present Illness:   Isaiah Jones is a 73 y.o. male with h/o AF, NICM / HFrEF s/p CRT-D, HLD seen today for routine electrophysiology followup.   Since last being seen in our clinic the patient reports doing very well. He notes that when he leans forward to play with his grandchildren he feels a hiccup / heartbeat in his left side/chest.  States he feels good in regards to energy.   He denies chest pain, palpitations, dyspnea, PND, orthopnea, nausea, vomiting, dizziness, syncope, edema, weight gain, or early satiety.   Review of systems complete and found to be negative unless listed in HPI.   EP Information / Studies Reviewed:    EKG is ordered today. Personal review as below.  EKG Interpretation Date/Time:  Tuesday July 17 2023 08:09:14 EDT Ventricular Rate:  80 PR Interval:  176 QRS Duration:  148 QT Interval:  442 QTC Calculation: 509 R Axis:   252  Text Interpretation: AV dual-paced rhythm with occasional ventricular-paced complexes Biventricular pacemaker detected Confirmed by Canary Brim ([id number]) on 07/17/2023 8:16:39 AM   ICD Interrogation-  reviewed in detail today,  See PACEART report.  Device History: Abbott BiV ICD implanted 03/16/2023 for HFrEF, NICM, LBBB.  History of appropriate therapy: No History of AAD therapy: Yes; currently on amiodarone   Device Optimization: implant LV 1-2 vector, at some point was changed to 1-4 for unclear reasons, no stim noted on any vectors L1-2 best threshold 1.5V @ 0.29ms & simultaneously gave best QRS morphology. Base pacing rate increased to 80 bpm to suppress PVC's. 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 The patient's score is based upon: CHF History: 1 HTN History: 1 Diabetes History: 0 Stroke History: 0 Vascular Disease History: 1 Age Score: 1 Gender Score: 0             Physical Exam:   VS:  BP 132/82   Pulse 80   Ht 6\' 2"  (1.88 m)   Wt 266 lb 12.8 oz (121 kg)   SpO2 97%   BMI 34.26 kg/m    Wt Readings from Last 3 Encounters:  07/17/23 266 lb 12.8 oz (121 kg)  06/26/23 270 lb 12.8 oz (122.8 kg)  06/19/23 274 lb 9.6 oz (124.6 kg)     GEN: Well nourished, well developed in no acute distress NECK: No JVD; No carotid bruits CARDIAC: Regular rate and rhythm, no murmurs, rubs, gallops RESPIRATORY:  Clear to auscultation without rales, wheezing or rhonchi  ABDOMEN: Soft, non-tender, non-distended EXTREMITIES:  No edema; No deformity   ASSESSMENT AND PLAN:    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 Elberta Fortis or EP APP in 6 months   Signed, Canary Brim, NP-C, AGACNP-BC [facility] - Electrophysiology  07/17/2023, 12:31 PM

## 2023-07-17 ENCOUNTER — Ambulatory Visit: Payer: Medicare Other | Attending: Pulmonary Disease | Admitting: Pulmonary Disease

## 2023-07-17 ENCOUNTER — Encounter: Payer: Self-pay | Admitting: Pulmonary Disease

## 2023-07-17 VITALS — BP 132/82 | HR 80 | Ht 74.0 in | Wt 266.8 lb

## 2023-07-17 DIAGNOSIS — I447 Left bundle-branch block, unspecified: Secondary | ICD-10-CM

## 2023-07-17 DIAGNOSIS — I4819 Other persistent atrial fibrillation: Secondary | ICD-10-CM | POA: Diagnosis not present

## 2023-07-17 DIAGNOSIS — I428 Other cardiomyopathies: Secondary | ICD-10-CM

## 2023-07-17 DIAGNOSIS — I5022 Chronic systolic (congestive) heart failure: Secondary | ICD-10-CM

## 2023-07-17 DIAGNOSIS — Z95810 Presence of automatic (implantable) cardiac defibrillator: Secondary | ICD-10-CM

## 2023-07-17 DIAGNOSIS — D6869 Other thrombophilia: Secondary | ICD-10-CM

## 2023-07-17 LAB — CUP PACEART INCLINIC DEVICE CHECK
Battery Remaining Longevity: 78 mo
Brady Statistic RA Percent Paced: 99 %
Brady Statistic RV Percent Paced: 97 %
Date Time Interrogation Session: 20250318123602
HighPow Impedance: 72 Ohm
Implantable Lead Connection Status: 753985
Implantable Lead Connection Status: 753985
Implantable Lead Connection Status: 753985
Implantable Lead Implant Date: 20241115
Implantable Lead Implant Date: 20241115
Implantable Lead Implant Date: 20241115
Implantable Lead Location: 753858
Implantable Lead Location: 753859
Implantable Lead Location: 753860
Implantable Pulse Generator Implant Date: 20241115
Lead Channel Impedance Value: 412.5 Ohm
Lead Channel Impedance Value: 525 Ohm
Lead Channel Impedance Value: 887.5 Ohm
Lead Channel Pacing Threshold Amplitude: 0.375 V
Lead Channel Pacing Threshold Amplitude: 0.75 V
Lead Channel Pacing Threshold Amplitude: 0.75 V
Lead Channel Pacing Threshold Amplitude: 1.75 V
Lead Channel Pacing Threshold Amplitude: 1.75 V
Lead Channel Pacing Threshold Pulse Width: 0.5 ms
Lead Channel Pacing Threshold Pulse Width: 0.5 ms
Lead Channel Pacing Threshold Pulse Width: 0.5 ms
Lead Channel Pacing Threshold Pulse Width: 0.5 ms
Lead Channel Pacing Threshold Pulse Width: 0.5 ms
Lead Channel Sensing Intrinsic Amplitude: 4 mV
Lead Channel Sensing Intrinsic Amplitude: 5.5 mV
Lead Channel Setting Pacing Amplitude: 1.375
Lead Channel Setting Pacing Amplitude: 2 V
Lead Channel Setting Pacing Amplitude: 2.25 V
Lead Channel Setting Pacing Pulse Width: 0.5 ms
Lead Channel Setting Pacing Pulse Width: 0.5 ms
Lead Channel Setting Sensing Sensitivity: 0.5 mV
Pulse Gen Serial Number: 810108245
Zone Setting Status: 755011

## 2023-07-17 NOTE — Patient Instructions (Signed)
 Medication Instructions:  Your physician recommends that you continue on your current medications as directed. Please refer to the Current Medication list given to you today.  *If you need a refill on your cardiac medications before your next appointment, please call your pharmacy*  Lab Work: None ordered If you have labs (blood work) drawn today and your tests are completely normal, you will receive your results only by: MyChart Message (if you have MyChart) OR A paper copy in the mail If you have any lab test that is abnormal or we need to change your treatment, we will call you to review the results.  Follow-Up: At [facility], you and your health needs are our priority.  As part of our continuing mission to provide you with exceptional heart care, we have created designated Provider Care Teams.  These Care Teams include your primary Cardiologist (physician) and Advanced Practice Providers (APPs -  Physician Assistants and Nurse Practitioners) who all work together to provide you with the care you need, when you need it.  Your next appointment:   6 month(s)  Provider:   Loman Brooklyn, MD or Canary Brim, NP

## 2023-07-24 NOTE — Progress Notes (Signed)
 Remote ICD transmission.

## 2023-07-24 NOTE — Addendum Note (Signed)
 Addended by: Geralyn Flash D on: 07/24/2023 03:40 PM   Modules accepted: Orders

## 2023-07-30 ENCOUNTER — Other Ambulatory Visit: Payer: Self-pay | Admitting: *Deleted

## 2023-07-30 DIAGNOSIS — I4819 Other persistent atrial fibrillation: Secondary | ICD-10-CM

## 2023-07-30 MED ORDER — RIVAROXABAN 20 MG PO TABS: 20.0000 mg | ORAL_TABLET | Freq: Every day | ORAL | 1 refills | Status: DC

## 2023-07-30 NOTE — Telephone Encounter (Signed)
 Xarelto 20mg  refill request received. Pt is 73 years old, weight-121kg, Crea-0.97 on 05/03/23, last seen by Brandi on 07/17/23, Diagnosis-Afib, CrCl-116.08 mL/min; Dose is appropriate based on dosing criteria. Will send in refill to requested pharmacy.

## 2023-08-24 NOTE — Telephone Encounter (Signed)
 Instruction letter has been discarded since patient hasn't picked it from our office.

## 2023-08-30 ENCOUNTER — Encounter: Payer: Self-pay | Admitting: Internal Medicine

## 2023-08-30 ENCOUNTER — Ambulatory Visit: Admitting: Internal Medicine

## 2023-08-30 VITALS — BP 110/70 | HR 81 | Temp 98.0°F | Ht 74.0 in | Wt 257.4 lb

## 2023-08-30 DIAGNOSIS — I110 Hypertensive heart disease with heart failure: Secondary | ICD-10-CM

## 2023-08-30 DIAGNOSIS — E785 Hyperlipidemia, unspecified: Secondary | ICD-10-CM | POA: Diagnosis not present

## 2023-08-30 DIAGNOSIS — R454 Irritability and anger: Secondary | ICD-10-CM

## 2023-08-30 DIAGNOSIS — M10072 Idiopathic gout, left ankle and foot: Secondary | ICD-10-CM

## 2023-08-30 DIAGNOSIS — Z95811 Presence of heart assist device: Secondary | ICD-10-CM | POA: Insufficient documentation

## 2023-08-30 DIAGNOSIS — E559 Vitamin D deficiency, unspecified: Secondary | ICD-10-CM

## 2023-08-30 DIAGNOSIS — J309 Allergic rhinitis, unspecified: Secondary | ICD-10-CM

## 2023-08-30 DIAGNOSIS — D801 Nonfamilial hypogammaglobulinemia: Secondary | ICD-10-CM

## 2023-08-30 LAB — COMPREHENSIVE METABOLIC PANEL WITH GFR
ALT: 21 U/L (ref 0–53)
AST: 25 U/L (ref 0–37)
Albumin: 4.1 g/dL (ref 3.5–5.2)
Alkaline Phosphatase: 57 U/L (ref 39–117)
BUN: 13 mg/dL (ref 6–23)
CO2: 32 meq/L (ref 19–32)
Calcium: 9.4 mg/dL (ref 8.4–10.5)
Chloride: 103 meq/L (ref 96–112)
Creatinine, Ser: 0.99 mg/dL (ref 0.40–1.50)
GFR: 75.32 mL/min (ref >60.00)
Glucose, Bld: 79 mg/dL (ref 70–99)
Potassium: 4.1 meq/L (ref 3.5–5.1)
Sodium: 141 meq/L (ref 135–145)
Total Bilirubin: 0.6 mg/dL (ref 0.2–1.2)
Total Protein: 6.3 g/dL (ref 6.0–8.3)

## 2023-08-30 LAB — VITAMIN D 25 HYDROXY (VIT D DEFICIENCY, FRACTURES): VITD: 45.44 ng/mL (ref 30.00–100.00)

## 2023-08-30 LAB — CBC WITH DIFFERENTIAL/PLATELET
Basophils Absolute: 0 10*3/uL (ref 0.0–0.1)
Basophils Relative: 0.6 % (ref 0.0–3.0)
Eosinophils Absolute: 0.1 10*3/uL (ref 0.0–0.7)
Eosinophils Relative: 1.3 % (ref 0.0–5.0)
HCT: 46.7 % (ref 39.0–52.0)
Hemoglobin: 15.5 g/dL (ref 13.0–17.0)
Lymphocytes Relative: 31.4 % (ref 12.0–46.0)
Lymphs Abs: 1.8 10*3/uL (ref 0.7–4.0)
MCHC: 33.2 g/dL (ref 30.0–36.0)
MCV: 102.1 fl — ABNORMAL HIGH (ref 78.0–100.0)
Monocytes Absolute: 0.4 10*3/uL (ref 0.1–1.0)
Monocytes Relative: 7.1 % (ref 3.0–12.0)
Neutro Abs: 3.4 10*3/uL (ref 1.4–7.7)
Neutrophils Relative %: 59.6 % (ref 43.0–77.0)
Platelets: 190 10*3/uL (ref 150.0–400.0)
RBC: 4.57 Mil/uL (ref 4.22–5.81)
RDW: 14.2 % (ref 11.5–15.5)
WBC: 5.7 10*3/uL (ref 4.0–10.5)

## 2023-08-30 LAB — HEMOGLOBIN A1C: Hgb A1c MFr Bld: 5.3 % (ref 4.6–6.5)

## 2023-08-30 LAB — LIPID PANEL
Cholesterol: 86 mg/dL (ref 0–200)
HDL: 54.6 mg/dL (ref >39.00)
LDL Cholesterol: 14 mg/dL (ref 0–99)
NonHDL: 31.19
Total CHOL/HDL Ratio: 2
Triglycerides: 87 mg/dL (ref 0.0–149.0)
VLDL: 17.4 mg/dL (ref 0.0–40.0)

## 2023-08-30 LAB — URIC ACID: Uric Acid, Serum: 3.2 mg/dL — ABNORMAL LOW (ref 4.0–7.8)

## 2023-08-30 NOTE — Progress Notes (Signed)
 ==============================  [facility]: [phone]   -- Medical Office Visit --  Patient: Isaiah Jones      Age: 73 y.o.       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 He is accompanied by his partner.  He has a history of hypogammaglobulinemia, identified during a previous visit, and is scheduled for repeat blood work to assess this condition. He also has a history of vitamin D  deficiency, for which he has been taking 5000 IU of vitamin D  daily. No changes in his medication regimen since the last visit.  He experiences seasonal allergies with symptoms of rhinorrhea and sneezing, attributed to pollen exposure. He has been using saline nasal sprays to manage these symptoms. Increased coughing is noted, but no new or worsening symptoms related to his heart condition are reported.  He has a history of heart failure and achalasia, which have been associated with a chronic cough. His coughing has increased, but there are no new or worsening symptoms related to his heart condition.  He has a history of knee replacements and varicose veins, which do not currently bother him. He has been experiencing less sweating since losing weight, which he attributes to his weight loss and the use of Wegovy .  He has been taking a stool softener to manage occasional constipation and has resumed taking vitamin B1 due to mosquito season. No chest pain, changes in swallowing, vomiting, nausea, or passing of stones. He reports good sleep quality, except when disturbed by his partner's snoring.  He has a history of depression but denies feeling depressed currently. He continues to  take Zoloft  for irritability.  BP Readings from Last 30 Encounters:  08/30/23 110/70  07/17/23 132/82  06/26/23 118/76  06/19/23 119/73  05/17/23 109/79  05/03/23 130/85  03/27/23 104/72  03/16/23 134/77  02/07/23 112/68  01/22/23 110/72  01/12/23 (!) 122/96  01/10/23 112/80  01/05/23 122/76  12/26/22 110/70  12/22/22 98/65  12/06/22 127/81  10/30/22 100/78  10/11/22 (!) 144/90  10/02/22 124/82  10/02/22 120/72  09/22/22 102/65  09/18/22 116/84  07/25/22 134/68  06/16/22 118/80  06/13/22 112/68  05/26/22 124/69  05/19/22 128/76  05/19/22 130/78  04/19/22 (!) 144/90  03/27/22 122/76   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); Anticoagulant long-term use; Achalasia of esophagus; Chronic HFrEF (heart failure with reduced ejection fraction) (HCC); Presbycusis; Laryngopharyngeal reflux (LPR); Insomnia with sleep apnea; History of arthritis; Foot pain, right; Hematuria; Allergic rhinitis; Weight disorder; Hypertension; At risk for falls; CAD (coronary artery disease); Calcium  oxalate kidney stones; Chronic kidney disease, stage 2 (mild); Structural heart disease; Abnormal EKG; Alcohol use; Nonischemic cardiomyopathy (HCC); Hypogammaglobulinemia (HCC); and Presence of heart assist device [facility]) on their problem list. Medications:  has a current medication list which includes the following prescription(s): acetaminophen , allopurinol , amiodarone , calcium  citrate, vitamin d3, colchicine , vitamin b-12,  diphenhydramine -acetaminophen , empagliflozin , repatha , fexofenadine, ginkgo, ipratropium-albuterol , methocarbamol , metoprolol  succinate, mometasone, multivitamin  with minerals, multiple vitamins-minerals, omega-3 fatty acids, rivaroxaban , rosuvastatin , entresto , wegovy , sertraline , spironolactone , thiamine, and amoxicillin .  Allergies:  is allergic to erythromycin.  Past Medical History:  has a past medical history of Anxiety, Arthritis, degenerative, Benign hypertensive heart disease without congestive heart failure, BMI 45.0-49.9, adult (HCC) (03/09/2018), CHF (congestive heart failure) (HCC), Chronic kidney disease, Colon polyps, Complication of anesthesia, Depression, Dyslipidemia, Dysrhythmia (2017), GERD (gastroesophageal reflux disease), Glaucoma, History of 2019 novel coronavirus disease (COVID-19) (06/2019), History of kidney stones, Hypertension, Hypertriglyceridemia (03/14/2022), OSA on CPAP, Persistent atrial fibrillation (HCC), Personal history of gout (05/23/2019), Pneumonia, PONV (postoperative nausea and vomiting), and Rhinitis, allergic. Past Surgical History:   has a past surgical history that includes Replacement total knee (Bilateral, 2008); Vasectomy; Tonsillectomy; arthroscopic knee surgery (Bilateral, 2003); Cardioversion (N/A, 08/10/2016); Laparoscopic gastric sleeve resection (N/A, 07/15/2019); Colonoscopy; Total hip arthroplasty (Right, 10/21/2019); Cystoscopy with retrograde pyelogram, ureteroscopy and stent placement (Left, 03/04/2021); Holmium laser application (Left, 03/04/2021); Esophageal manometry (N/A, 03/22/2022); RIGHT/LEFT HEART CATH AND CORONARY ANGIOGRAPHY (N/A, 09/22/2022); CORONARY PRESSURE/FFR STUDY (N/A, 09/22/2022); Cystoscopy with retrograde pyelogram, ureteroscopy and stent placement (Right, 01/12/2023); BIV ICD INSERTION CRT-D (N/A, 03/16/2023); Implantable cardioverter defibrillator generator change (03/16/2023); and CARDIOVERSION (N/A, 05/17/2023). Social History:    reports that he quit smoking about 22 years ago. His smoking use included cigarettes. He has been exposed to tobacco smoke. He has never used smokeless tobacco. He reports current alcohol use. He reports that he does not use drugs. Family History:  family history includes Alzheimer's disease in his mother; Arthritis in his father and mother; Esophageal cancer in his father; Heart attack in his mother; Heart attack (age of onset: 48) in his father; Hyperlipidemia in his brother, father, and mother; Hypertension in his brother, father, and mother; Other in his brother. Depression Screen and Health Maintenance:    08/30/2023    8:26 AM 03/27/2023    8:26 AM 01/10/2023   10:56 AM 12/22/2022    8:37 AM  PHQ 2/9 Scores  PHQ - 2 Score 0 0 3 0  PHQ- 9 Score 1 0 6 1    Medication Reconciliation: Current Outpatient Medications on File Prior to Visit  Medication Sig   acetaminophen  (TYLENOL ) 500 MG tablet Take 500-1,000 mg by mouth every 6 (six) hours as needed for headache.   allopurinol  (ZYLOPRIM ) 300 MG tablet Take 1 tablet (300 mg total) by mouth daily.   amiodarone  (PACERONE ) 200 MG tablet Take 1 tablet (200 mg total) by mouth daily.   calcium  citrate (CALCITRATE - DOSED IN MG ELEMENTAL CALCIUM ) 950 (200 Ca) MG tablet Take 200 mg of elemental calcium  by mouth 3 (three) times daily.   Cholecalciferol (VITAMIN D3) 125 MCG (5000 UT) TABS Take 5,000 Units by mouth daily.   colchicine  0.6 MG tablet Take 1 tablet (0.6 mg total) by mouth daily. (Patient taking differently: Take 0.6 mg by mouth daily as needed (gout flares).)   Cyanocobalamin  (VITAMIN B-12) 5000 MCG SUBL Place under the tongue.   diphenhydramine -acetaminophen  (TYLENOL  PM) 25-500 MG TABS tablet Take 2 tablets by mouth at bedtime.    empagliflozin  (JARDIANCE ) 10 MG TABS tablet Take 1 tablet (10 mg total) by mouth daily before breakfast.   Evolocumab  (REPATHA ) 140 MG/ML SOSY Inject 140 mg into the skin every 14 (fourteen) days.   fexofenadine  (ALLEGRA) 180 MG tablet Take 180 mg by mouth daily as needed for allergies or rhinitis.   Ginkgo 60 MG TABS Take 60 mg by mouth daily.   ipratropium-albuterol  (DUONEB) 0.5-2.5 (3) MG/3ML  SOLN Take 3 mLs by nebulization every 4 (four) hours as needed.   methocarbamol  (ROBAXIN -750) 750 MG tablet Take 1 tablet (750 mg total) by mouth 4 (four) times daily. (Patient taking differently: Take 750 mg by mouth every 6 (six) hours as needed for muscle spasms.)   metoprolol  succinate (TOPROL -XL) 50 MG 24 hr tablet Take 1 tablet (50 mg total) by mouth daily. Take with or immediately following a meal.   mometasone (NASONEX) 50 MCG/ACT nasal spray Place 1 spray into the nose at bedtime.   Multiple Vitamin (MULTIVITAMIN WITH MINERALS) TABS tablet Take 1 tablet by mouth 2 (two) times daily. Bariatric Multivitamin   Multiple Vitamins-Minerals (AIRBORNE PO) Take 1 tablet by mouth daily as needed (immune health).    Omega-3 Fatty Acids (FISH OIL PO) Take 60 capsules by mouth daily. Krill oil   rivaroxaban  (XARELTO ) 20 MG TABS tablet Take 1 tablet (20 mg total) by mouth daily with supper.   rosuvastatin  (CRESTOR ) 40 MG tablet Take 1 tablet (40 mg total) by mouth daily.   sacubitril-valsartan  (ENTRESTO ) 49-51 MG Take 1 tablet by mouth 2 (two) times daily.   Semaglutide -Weight Management (WEGOVY ) 2.4 MG/0.75ML SOAJ Inject 2.4 mg into the skin once a week.   sertraline  (ZOLOFT ) 50 MG tablet TAKE 1 TABLET DAILY   spironolactone  (ALDACTONE ) 25 MG tablet TAKE 1/2 TABLET(12.5MG      TOTAL) DAILY   thiamine (VITAMIN B-1) 100 MG tablet Take 100 mg by mouth daily.   amoxicillin  (AMOXIL ) 250 MG capsule Take 1,000 mg by mouth See admin instructions. Take 1000 mg 1 hour prior to dental work (Patient not taking: Reported on 08/30/2023)   No current facility-administered medications on file prior to visit.  There are no discontinued medications.   Physical Exam:    08/30/2023    7:54 AM 07/17/2023    8:12 AM 06/26/2023    7:54 AM   Vitals with BMI  Height 6\' 2"  6\' 2"  6\' 2"   Weight 257 lbs 6 oz 266 lbs 13 oz 270 lbs 13 oz  BMI 33.03 34.24 34.75  Systolic 110 132 161  Diastolic 70 82 76  Pulse 81 80 71  Vital signs reviewed.  Nursing notes reviewed. Weight trend reviewed. Physical Exam  Physical Exam General Appearance:  No acute distress appreciable.   Well-groomed, healthy-appearing male.  Well proportioned with no abnormal fat distribution.  Good muscle tone. Pulmonary:  Normal work of breathing at rest, no respiratory distress apparent. SpO2: 98 %  Musculoskeletal: All extremities are intact.  Neurological:  Awake, alert, oriented, and engaged.  No obvious focal neurological deficits or cognitive impairments.  Sensorium seems unclouded.   Speech is clear and coherent with logical content. Psychiatric:  Appropriate mood, pleasant and cooperative demeanor, thoughtful and engaged during the exam      Results for orders placed or performed in visit on 08/30/23  CBC with Differential/Platelet  Result Value Ref Range   WBC 5.7 4.0 - 10.5 K/uL   RBC 4.57 4.22 - 5.81 Mil/uL   Hemoglobin 15.5 13.0 - 17.0 g/dL   HCT 09.6 04.5 - 40.9 %   MCV 102.1 (H) 78.0 - 100.0 fl   MCHC 33.2 30.0 - 36.0 g/dL   RDW 81.1 91.4 - 78.2 %   Platelets 190.0 150.0 - 400.0 K/uL   Neutrophils Relative % 59.6 43.0 - 77.0 %   Lymphocytes Relative 31.4 12.0 - 46.0 %   Monocytes Relative 7.1 3.0 - 12.0 %   Eosinophils Relative 1.3 0.0 -  5.0 %   Basophils Relative 0.6 0.0 - 3.0 %   Neutro Abs 3.4 1.4 - 7.7 K/uL   Lymphs Abs 1.8 0.7 - 4.0 K/uL   Monocytes Absolute 0.4 0.1 - 1.0 K/uL   Eosinophils Absolute 0.1 0.0 - 0.7 K/uL   Basophils Absolute 0.0 0.0 - 0.1 K/uL  Comprehensive metabolic panel with GFR  Result Value Ref Range   Sodium 141 135 - 145 mEq/L   Potassium 4.1 3.5 - 5.1 mEq/L   Chloride 103 96 - 112 mEq/L   CO2 32 19 - 32 mEq/L   Glucose, Bld 79 70 - 99 mg/dL   BUN 13 6 - 23 mg/dL   Creatinine, Ser 0.45 0.40 - 1.50  mg/dL   Total Bilirubin 0.6 0.2 - 1.2 mg/dL   Alkaline Phosphatase 57 39 - 117 U/L   AST 25 0 - 37 U/L   ALT 21 0 - 53 U/L   Total Protein 6.3 6.0 - 8.3 g/dL   Albumin  4.1 3.5 - 5.2 g/dL   GFR 40.98 >11.91 mL/min   Calcium  9.4 8.4 - 10.5 mg/dL  Lipid panel  Result Value Ref Range   Cholesterol 86 0 - 200 mg/dL   Triglycerides 47.8 0.0 - 149.0 mg/dL   HDL 29.56 >21.30 mg/dL   VLDL 86.5 0.0 - 78.4 mg/dL   LDL Cholesterol 14 0 - 99 mg/dL   Total CHOL/HDL Ratio 2    NonHDL 31.19   Hemoglobin A1c  Result Value Ref Range   Hgb A1c MFr Bld 5.3 4.6 - 6.5 %  Vitamin D  (25 hydroxy)  Result Value Ref Range   VITD 45.44 30.00 - 100.00 ng/mL  Uric acid  Result Value Ref Range   Uric Acid, Serum 3.2 (L) 4.0 - 7.8 mg/dL   Office Visit on 69/62/9528  Component Date Value   WBC 08/30/2023 5.7    RBC 08/30/2023 4.57    Hemoglobin 08/30/2023 15.5    HCT 08/30/2023 46.7    MCV 08/30/2023 102.1 (H)    MCHC 08/30/2023 33.2    RDW 08/30/2023 14.2    Platelets 08/30/2023 190.0    Neutrophils Relative % 08/30/2023 59.6    Lymphocytes Relative 08/30/2023 31.4    Monocytes Relative 08/30/2023 7.1    Eosinophils Relative 08/30/2023 1.3    Basophils Relative 08/30/2023 0.6    Neutro Abs 08/30/2023 3.4    Lymphs Abs 08/30/2023 1.8    Monocytes Absolute 08/30/2023 0.4    Eosinophils Absolute 08/30/2023 0.1    Basophils Absolute 08/30/2023 0.0    Sodium 08/30/2023 141    Potassium 08/30/2023 4.1    Chloride 08/30/2023 103    CO2 08/30/2023 32    Glucose, Bld 08/30/2023 79    BUN 08/30/2023 13    Creatinine, Ser 08/30/2023 0.99    Total Bilirubin 08/30/2023 0.6    Alkaline Phosphatase 08/30/2023 57    AST 08/30/2023 25    ALT 08/30/2023 21    Total Protein 08/30/2023 6.3    Albumin  08/30/2023 4.1    GFR 08/30/2023 75.32    Calcium  08/30/2023 9.4    Cholesterol 08/30/2023 86    Triglycerides 08/30/2023 87.0    HDL 08/30/2023 54.60    VLDL 08/30/2023 17.4    LDL Cholesterol 08/30/2023  14    Total CHOL/HDL Ratio 08/30/2023 2    NonHDL 08/30/2023 31.19    Hgb A1c MFr Bld 08/30/2023 5.3    VITD 08/30/2023 45.44    Uric Acid, Serum 08/30/2023 3.2 (L)  Office Visit on 07/17/2023  Component Date Value   Date Time Interrogation * 07/17/2023 [id number]    Pulse Generator Manufact* 07/17/2023 SJCR    Pulse Gen Model 07/17/2023 [id number] Gallant HF    Pulse Gen Serial Number 07/17/2023 [id number]    Clinic Name 07/17/2023 [facility]    Implantable Pulse Genera* 07/17/2023 Cardiac Resynch Therapy Defibulator    Implantable Pulse Genera* 07/17/2023 [id number]    Implantable Lead Manufac* 07/17/2023 SJCR    Implantable Lead Model 07/17/2023 1456Q Quartet    Implantable Lead Serial * 07/17/2023 [id number]    Implantable Lead Implant* 07/17/2023 [id number]    Implantable Lead Locatio* 07/17/2023 UNKNOWN    Implantable Lead Location 07/17/2023 660630    Implantable Lead Connect* 07/17/2023 160109    Implantable Lead Manufac* 07/17/2023 SJCR    Implantable Lead Model 07/17/2023 7122Q Durata SJ4    Implantable Lead Serial * 07/17/2023 [id number]    Implantable Lead Implant* 07/17/2023 [id number]    Implantable Lead Locatio* 07/17/2023 UNKNOWN    Implantable Lead Location 07/17/2023 237628    Implantable Lead Connect* 07/17/2023 315176    Implantable Lead Manufac* 07/17/2023 OTHER    Implantable Lead Model 07/17/2023 HYW7371/06 ULTIPACE    Implantable Lead Serial * 07/17/2023 [id number]    Implantable Lead Implant* 07/17/2023 [id number]    Implantable Lead Locatio* 07/17/2023 UNKNOWN    Implantable Lead Location 07/17/2023 818299    Implantable Lead Connect* 07/17/2023 371696    Lead Channel Setting Sen* 07/17/2023 0.5    Lead Channel Setting Pac* 07/17/2023 2.0    Lead Channel Setting Pac* 07/17/2023 0.5    Lead Channel Setting Pac* 07/17/2023 1.375    Lead Channel Setting Pac* 07/17/2023 0.5    Lead Channel Setting Pac* 07/17/2023 2.25    Lead Channel Setting Pac*  07/17/2023 Adaptive Capture    Zone Setting Status 07/17/2023 Active    Zone Setting Status 07/17/2023 Inactive    Zone Setting Status 07/17/2023 789381    Lead Channel Impedance V* 07/17/2023 525.0    Lead Channel Sensing Int* 07/17/2023 4.0    Lead Channel Pacing Thre* 07/17/2023 0.75    Lead Channel Pacing Thre* 07/17/2023 0.5    Lead Channel Pacing Thre* 07/17/2023 0.75    Lead Channel Pacing Thre* 07/17/2023 0.5    Lead Channel Impedance V* 07/17/2023 412.5    Lead Channel Sensing Int* 07/17/2023 5.5    Lead Channel Pacing Thre* 07/17/2023 0.375    Lead Channel Pacing Thre* 07/17/2023 0.5    HighPow Impedance 07/17/2023 72.0    Lead Channel Impedance V* 07/17/2023 887.5    Lead Channel Pacing Thre* 07/17/2023 1.75    Lead Channel Pacing Thre* 07/17/2023 0.5    Lead Channel Pacing Thre* 07/17/2023 1.75    Lead Channel Pacing Thre* 07/17/2023 0.5    Battery Remaining Longev* 07/17/2023 78    Brady Statistic RA Perce* 07/17/2023 99.0    Brady Statistic RV Perce* 07/17/2023 97.0   Office Visit on 06/26/2023  Component Date Value   Total Protein 06/26/2023 6.4    Albumin  06/26/2023 4.3    Bilirubin Total 06/26/2023 0.8    Bilirubin, Direct 06/26/2023 0.34    Alkaline Phosphatase 06/26/2023 66    AST 06/26/2023 30    ALT 06/26/2023 23    TSH 06/26/2023 2.580    Date Time Interrogation * 06/26/2023 [id number]    Pulse Generator Manufact* 06/26/2023 SJCR    Pulse Gen Model 06/26/2023 [id number] Gallant HF    Pulse Gen Serial Number 06/26/2023 [id number]  Clinic Name 06/26/2023 [facility]    Implantable Pulse Genera* 06/26/2023 Cardiac Resynch Therapy Defibulator    Implantable Pulse Genera* 06/26/2023 [id number]    Implantable Lead Manufac* 06/26/2023 SJCR    Implantable Lead Model 06/26/2023 1456Q Quartet    Implantable Lead Serial * 06/26/2023 [id number]    Implantable Lead Implant* 06/26/2023 [id number]    Implantable Lead Locatio* 06/26/2023 UNKNOWN    Implantable  Lead Location 06/26/2023 644034    Implantable Lead Connect* 06/26/2023 742595    Implantable Lead Manufac* 06/26/2023 SJCR    Implantable Lead Model 06/26/2023 7122Q Durata SJ4    Implantable Lead Serial * 06/26/2023 [id number]    Implantable Lead Implant* 06/26/2023 [id number]    Implantable Lead Locatio* 06/26/2023 UNKNOWN    Implantable Lead Location 06/26/2023 660630    Implantable Lead Connect* 06/26/2023 160109    Implantable Lead Manufac* 06/26/2023 OTHER    Implantable Lead Model 06/26/2023 NAT5573/22 ULTIPACE    Implantable Lead Serial * 06/26/2023 [id number]    Implantable Lead Implant* 06/26/2023 [id number]    Implantable Lead Locatio* 06/26/2023 UNKNOWN    Implantable Lead Location 06/26/2023 176160    Implantable Lead Connect* 06/26/2023 737106    Lead Channel Setting Sen* 06/26/2023 0.5    Lead Channel Setting Pac* 06/26/2023 2.5    Lead Channel Setting Pac* 06/26/2023 0.5    Lead Channel Setting Pac* 06/26/2023 1.5    Lead Channel Setting Pac* 06/26/2023 0.5    Lead Channel Setting Pac* 06/26/2023 2.0    Lead Channel Setting Pac* 06/26/2023 Fixed Pacing    Zone Setting Status 06/26/2023 Active    Zone Setting Status 06/26/2023 Inactive    Zone Setting Status 06/26/2023 755011    Lead Channel Sensing Int* 06/26/2023 3.6    Lead Channel Sensing Int* 06/26/2023 10.0    Lead Channel Pacing Thre* 06/26/2023 0.5    Lead Channel Pacing Thre* 06/26/2023 0.5    Lead Channel Impedance V* 06/26/2023 787.5    Lead Channel Pacing Thre* 06/26/2023 1.5    Lead Channel Pacing Thre* 06/26/2023 0.5    Lead Channel Pacing Thre* 06/26/2023 1.5    Lead Channel Pacing Thre* 06/26/2023 0.5    Battery Remaining Longev* 06/26/2023 80    Brady Statistic RA Perce* 06/26/2023 95.0    Brady Statistic RV Perce* 06/26/2023 97.0   Clinical Support on 06/18/2023  Component Date Value   Date Time Interrogation * 06/19/2023 [id number]    Pulse Generator Manufact* 06/19/2023 SJCR    Pulse Gen  Model 06/19/2023 [id number] Gallant HF    Pulse Gen Serial Number 06/19/2023 [id number]    Clinic Name 06/19/2023 [facility]    Implantable Pulse Genera* 06/19/2023 Cardiac Resynch Therapy Defibulator    Implantable Pulse Genera* 06/19/2023 [id number]    Implantable Lead Manufac* 06/19/2023 SJCR    Implantable Lead Model 06/19/2023 1456Q Quartet    Implantable Lead Serial * 06/19/2023 [id number]    Implantable Lead Implant* 06/19/2023 [id number]    Implantable Lead Locatio* 06/19/2023 UNKNOWN    Implantable Lead Location 06/19/2023 824235    Implantable Lead Connect* 06/19/2023 361443    Implantable Lead Manufac* 06/19/2023 SJCR    Implantable Lead Model 06/19/2023 7122Q Durata SJ4    Implantable Lead Serial * 06/19/2023 [id number]    Implantable Lead Implant* 06/19/2023 [id number]    Implantable Lead Locatio* 06/19/2023 UNKNOWN    Implantable Lead Location 06/19/2023 712458    Implantable Lead Connect* 06/19/2023 099833    Implantable Lead Manufac* 06/19/2023 OTHER    Implantable Lead Model 06/19/2023  ZOX0960/45 ULTIPACE    Implantable Lead Serial * 06/19/2023 [id number]    Implantable Lead Implant* 06/19/2023 [id number]    Implantable Lead Locatio* 06/19/2023 UNKNOWN    Implantable Lead Location 06/19/2023 308657    Implantable Lead Connect* 06/19/2023 846962    Lead Channel Setting Sen* 06/19/2023 0.5    Lead Channel Setting Sen* 06/19/2023 Adaptive Sensing    Lead Channel Setting Pac* 06/19/2023 2.5    Lead Channel Setting Pac* 06/19/2023 0.5    Lead Channel Setting Pac* 06/19/2023 1.5    Lead Channel Setting Pac* 06/19/2023 0.5    Lead Channel Setting Pac* 06/19/2023 1.5    Lead Channel Setting Pac* 06/19/2023 Fixed Pacing    Zone Setting Status 06/19/2023 Active    Zone Setting Status 06/19/2023 Inactive    Zone Setting Status 06/19/2023 755011    Lead Channel Status 06/19/2023 NULL    Lead Channel Impedance V* 06/19/2023 910    Lead Channel Pacing Thre* 06/19/2023 1.0    Lead  Channel Pacing Thre* 06/19/2023 1.0    Lead Channel Status 06/19/2023 NULL    Lead Channel Impedance V* 06/19/2023 540    Lead Channel Sensing Int* 06/19/2023 4.2    Lead Channel Pacing Thre* 06/19/2023 0.5    Lead Channel Pacing Thre* 06/19/2023 0.5    Lead Channel Status 06/19/2023 NULL    Lead Channel Impedance V* 06/19/2023 450    Lead Channel Sensing Int* 06/19/2023 12.0    Lead Channel Pacing Thre* 06/19/2023 0.5    Lead Channel Pacing Thre* 06/19/2023 0.5    HighPow Impedance 06/19/2023 74    HighPow Imped Status 06/19/2023 NULL    Battery Status 06/19/2023 MOS    Battery Remaining Longev* 06/19/2023 85    Battery Remaining Percen* 06/19/2023 92.0    Battery Voltage 06/19/2023 2.96    Brady Statistic RA Perce* 06/19/2023 94.0    Brady Statistic AP VP Pe* 06/19/2023 95.0    Brady Statistic AS VP Pe* 06/19/2023 1.0    Brady Statistic AP VS Pe* 06/19/2023 2.3    Brady Statistic AS VS Pe* 06/19/2023 1.0   Office Visit on 05/03/2023  Component Date Value   Glucose 05/03/2023 77    BUN 05/03/2023 12    Creatinine, Ser 05/03/2023 0.97    eGFR 05/03/2023 82    BUN/Creatinine Ratio 05/03/2023 12    Sodium 05/03/2023 144    Potassium 05/03/2023 4.4    Chloride 05/03/2023 104    CO2 05/03/2023 25    Calcium  05/03/2023 10.1    WBC 05/03/2023 6.9    RBC 05/03/2023 4.63    Hemoglobin 05/03/2023 15.6    Hematocrit 05/03/2023 46.8    MCV 05/03/2023 101 (H)    MCH 05/03/2023 33.7 (H)    MCHC 05/03/2023 33.3    RDW 05/03/2023 13.0    Platelets 05/03/2023 170    Neutrophils 05/03/2023 64    Lymphs 05/03/2023 26    Monocytes 05/03/2023 8    Eos 05/03/2023 1    Basos 05/03/2023 1    Neutrophils Absolute 05/03/2023 4.4    Lymphocytes Absolute 05/03/2023 1.8    Monocytes Absolute 05/03/2023 0.5    EOS (ABSOLUTE) 05/03/2023 0.1    Basophils Absolute 05/03/2023 0.0    Immature Granulocytes 05/03/2023 0    Immature Grans (Abs) 05/03/2023 0.0   Clinical Support on 04/04/2023   Component Date Value   Date Time Interrogation * 04/04/2023 [id number]    Pulse Generator Manufact* 04/04/2023 SJCR    Pulse Gen Model 04/04/2023  [id number] Gallant HF    Pulse Gen Serial Number 04/04/2023 [id number]    Clinic Name 04/04/2023 [facility]    Implantable Pulse Genera* 04/04/2023 Cardiac Resynch Therapy Defibulator    Implantable Pulse Genera* 04/04/2023 [id number]    Implantable Lead Manufac* 04/04/2023 SJCR    Implantable Lead Model 04/04/2023 1456Q Quartet    Implantable Lead Serial * 04/04/2023 [id number]    Implantable Lead Implant* 04/04/2023 [id number]    Implantable Lead Locatio* 04/04/2023 UNKNOWN    Implantable Lead Location 04/04/2023 132440    Implantable Lead Connect* 04/04/2023 102725    Implantable Lead Manufac* 04/04/2023 SJCR    Implantable Lead Model 04/04/2023 7122Q Durata SJ4    Implantable Lead Serial * 04/04/2023 [id number]    Implantable Lead Implant* 04/04/2023 [id number]    Implantable Lead Locatio* 04/04/2023 UNKNOWN    Implantable Lead Location 04/04/2023 [id number]    Implantable Lead Connect* 04/04/2023 [id number]    Implantable Lead Manufac* 04/04/2023 OTHER    Implantable Lead Model 04/04/2023 [id number]    Implantable Lead Serial * 04/04/2023 [id number]    Implantable Lead Implant* 04/04/2023 [id number]    Implantable Lead Locatio* 04/04/2023 UNKNOWN    Implantable Lead Location 04/04/2023 [id number]    Implantable Lead Connect* 04/04/2023 [id number]    Lead Channel Setting Sen* 04/04/2023 0.5    Lead Channel Setting Pac* 04/04/2023 0.5    Lead Channel Setting Pac* 04/04/2023 3.5    Lead Channel Setting Pac* 04/04/2023 0.5    Lead Channel Setting Pac* 04/04/2023 3.5    Lead Channel Setting Pac* 04/04/2023 Fixed Pacing    Zone Setting Status 04/04/2023 Active    Zone Setting Status 04/04/2023 Inactive    Zone Setting Status 04/04/2023 [id number]    Lead Channel Impedance V* 04/04/2023 525.0    Lead Channel Impedance V* 04/04/2023 450.0    Lead  Channel Sensing Int* 04/04/2023 4.2    Lead Channel Pacing Thre* 04/04/2023 0.5    Lead Channel Pacing Thre* 04/04/2023 0.5    Lead Channel Pacing Thre* 04/04/2023 0.5    Lead Channel Pacing Thre* 04/04/2023 0.5    HighPow Impedance 04/04/2023 64.125    Lead Channel Impedance V* 04/04/2023 725.0    Lead Channel Pacing Thre* 04/04/2023 1.0    Lead Channel Pacing Thre* 04/04/2023 0.5    Lead Channel Pacing Thre* 04/04/2023 1.0    Lead Channel Pacing Thre* 04/04/2023 0.5    Battery Remaining Longev* 04/04/2023 75    Brady Statistic RA Perce* 04/04/2023 0    Brady Statistic RV Perce* 04/04/2023 53.0   Office Visit on 03/27/2023  Component Date Value   PTH 03/27/2023 31    Calcium  03/27/2023 9.7    Calcium , Ion 03/27/2023 5.3    WBC 03/27/2023 6.3    RBC 03/27/2023 4.48    Hemoglobin 03/27/2023 15.0    HCT 03/27/2023 45.7    MCV 03/27/2023 102.0 (H)    MCHC 03/27/2023 32.9    RDW 03/27/2023 15.2    Platelets 03/27/2023 215.0    Neutrophils Relative % 03/27/2023 57.8    Lymphocytes Relative 03/27/2023 30.2    Monocytes Relative 03/27/2023 7.9    Eosinophils Relative 03/27/2023 3.1    Basophils Relative 03/27/2023 1.0    Neutro Abs 03/27/2023 3.7    Lymphs Abs 03/27/2023 1.9    Monocytes Absolute 03/27/2023 0.5    Eosinophils Absolute 03/27/2023 0.2    Basophils Absolute 03/27/2023 0.1    Sodium 03/27/2023 141    Potassium 03/27/2023 4.3    Chloride 03/27/2023  104    CO2 03/27/2023 32    Glucose, Bld 03/27/2023 77    BUN 03/27/2023 12    Creatinine, Ser 03/27/2023 0.76    Total Bilirubin 03/27/2023 0.5    Alkaline Phosphatase 03/27/2023 63    AST 03/27/2023 21    ALT 03/27/2023 15    Total Protein 03/27/2023 6.2    Albumin  03/27/2023 3.9    GFR 03/27/2023 89.14    Calcium  03/27/2023 9.6    VITD 03/27/2023 45.99    TSH 03/27/2023 0.967    Magnesium 03/27/2023 1.8    Osmolality, Ur 03/27/2023 810    Osmolality 03/27/2023 296    Creatinine, Urine 03/27/2023 185     Sodium, Ur 03/27/2023 162    Vitamin B-12 03/27/2023 401    Folate 03/27/2023 >24.2    Path Review 03/27/2023     Total Protein 03/27/2023 5.8 (L)    Albumin  ELP 03/27/2023 3.5 (L)    Alpha 1 03/27/2023 0.3    Alpha 2 03/27/2023 0.8    Beta Globulin 03/27/2023 0.4    Beta 2 03/27/2023 0.3    Gamma Globulin 03/27/2023 0.5 (L)    Abnormal Protein Band1 03/27/2023     SPE Interp. 03/27/2023     Cholesterol 03/27/2023 115    Triglycerides 03/27/2023 90.0    HDL 03/27/2023 49.30    VLDL 03/27/2023 18.0    LDL Cholesterol 03/27/2023 48    Total CHOL/HDL Ratio 03/27/2023 2    NonHDL 03/27/2023 65.89   Orders Only on 03/07/2023  Component Date Value   WBC 03/07/2023 5.1    RBC 03/07/2023 4.61    Hemoglobin 03/07/2023 15.2    Hematocrit 03/07/2023 47.6    MCV 03/07/2023 103 (H)    MCH 03/07/2023 33.0    MCHC 03/07/2023 31.9    RDW 03/07/2023 13.7    Platelets 03/07/2023 184    Glucose 03/07/2023 80    BUN 03/07/2023 9    Creatinine, Ser 03/07/2023 0.79    eGFR 03/07/2023 94    BUN/Creatinine Ratio 03/07/2023 11    Sodium 03/07/2023 142    Potassium 03/07/2023 4.5    Chloride 03/07/2023 105    CO2 03/07/2023 28    Calcium  03/07/2023 9.6   Ancillary Procedure on 02/22/2023  Component Date Value   Area-P 1/2 02/22/2023 3.38    S' Lateral 02/22/2023 5.10    Est EF 02/22/2023 25 - 30%   Admission on 01/05/2023, Discharged on 01/05/2023  Component Date Value   Sodium 01/05/2023 131 (L)    Potassium 01/05/2023 4.2    Chloride 01/05/2023 96 (L)    CO2 01/05/2023 25    Glucose, Bld 01/05/2023 107 (H)    BUN 01/05/2023 13    Creatinine, Ser 01/05/2023 0.87    Calcium  01/05/2023 8.6 (L)    Total Protein 01/05/2023 6.5    Albumin  01/05/2023 3.3 (L)    AST 01/05/2023 13 (L)    ALT 01/05/2023 11    Alkaline Phosphatase 01/05/2023 61    Total Bilirubin 01/05/2023 1.7 (H)    GFR, Estimated 01/05/2023 >60    Anion gap 01/05/2023 10    WBC 01/05/2023 8.0    RBC 01/05/2023 4.32     Hemoglobin 01/05/2023 14.2    HCT 01/05/2023 42.7    MCV 01/05/2023 98.8    MCH 01/05/2023 32.9    MCHC 01/05/2023 33.3    RDW 01/05/2023 14.1    Platelets 01/05/2023 177    nRBC 01/05/2023 0.0    Neutrophils  Relative % 01/05/2023 73    Neutro Abs 01/05/2023 5.7    Lymphocytes Relative 01/05/2023 15    Lymphs Abs 01/05/2023 1.2    Monocytes Relative 01/05/2023 12    Monocytes Absolute 01/05/2023 1.0    Eosinophils Relative 01/05/2023 0    Eosinophils Absolute 01/05/2023 0.0    Basophils Relative 01/05/2023 0    Basophils Absolute 01/05/2023 0.0    Immature Granulocytes 01/05/2023 0    Abs Immature Granulocytes 01/05/2023 0.03    Color, Urine 01/05/2023 YELLOW    APPearance 01/05/2023 CLEAR    Specific Gravity, Urine 01/05/2023 1.013    pH 01/05/2023 6.0    Glucose, UA 01/05/2023 NEGATIVE    Hgb urine dipstick 01/05/2023 SMALL (A)    Bilirubin Urine 01/05/2023 NEGATIVE    Ketones, ur 01/05/2023 20 (A)    Protein, ur 01/05/2023 NEGATIVE    Nitrite 01/05/2023 NEGATIVE    Leukocytes,Ua 01/05/2023 NEGATIVE    RBC / HPF 01/05/2023 0-5    WBC, UA 01/05/2023 0-5    Bacteria, UA 01/05/2023 NONE SEEN    Squamous Epithelial / HPF 01/05/2023 0-5    Mucus 01/05/2023 PRESENT   There may be more visits with results that are not included.  No image results found. CUP PACEART INCLINIC DEVICE CHECK Result Date: 07/17/2023 Normal in-clinic CRT-D (multi-lead) check. Presenting Rhythm: AP/BiV with PVC's. Routine testing was performed. Thresholds, sensing, impedance trend were stable and no changes were required. HF diagnostics are stable. No treated arrhythmias. Patient BiV pacing 97% of the time. Atrial output reduced to 2V from 2.5V. Reviewed LV vector, confirmed no diaphragmatic stim with CapConfirm on > confirmed diphragmatic stim at 4V when leaning forward, does not feel at 2V.  Estimated longevity 6.3-6.6 years. Pt enrolled in remote follow-up. bo  CUP PACEART INCLINIC DEVICE  CHECK Result Date: 06/26/2023 in-clinic CRT-D check. RA/RV Thresholds, sensing, impedance trend, and HF diagnostics are within normal limits or stable for patient over time. No episodes. Patient BiV pacing _95_% of the time. Estimated longevity __post programming est is 6.7 years__ .  Pt enrolled in remote follow-up. see office note for full discussion LV threshold changed > CXR with small lead pull back > LV opt and EKG opt done PVCs, base pacing rate increased final programming session did not transfer LV 1-2, simultaneous 2.0/0.5 RU  DG Chest 2 View Result Date: 06/26/2023 CLINICAL DATA:  ICD lead placement EXAM: CHEST - 2 VIEW COMPARISON:  March 16, 2023 FINDINGS: No change in the left subclavian bipolar ICDF pacemaker device tip of the leads in good position no evidence of discontinuity no infiltrates or consolidations or pulmonary edema. No pleural effusions IMPRESSION: No active cardiopulmonary disease. Electronically Signed   By: Fredrich Jefferson M.D.   On: 06/26/2023 14:16   CUP PACEART REMOTE DEVICE CHECK Result Date: 06/20/2023 Scheduled remote reviewed. Normal device function.  Next remote 91 days. ML, CVRS     Results LABS IgG: decreased Vitamin D : deficient    Assessment & Plan Vitamin D  deficiency Previously diagnosed vitamin D  deficiency is being managed with supplements. Improvement in vitamin D  levels is anticipated. Check vitamin D  levels with blood work. Dyslipidemia Medications: not yet discussed Lab Results  Component Value Date   HDL 54.60 08/30/2023   HDL 49.30 03/27/2023   HDL 38.90 (L) 12/22/2022   CHOLHDL 2 08/30/2023   CHOLHDL 2 03/27/2023   CHOLHDL 2 12/22/2022   Lab Results  Component Value Date   LDLCALC 14 08/30/2023   LDLCALC 48 03/27/2023  LDLCALC 18 12/22/2022   LDLDIRECT 76.0 06/11/2020   Lab Results  Component Value Date   TRIG 87.0 08/30/2023   TRIG 90.0 03/27/2023   TRIG 97.0 12/22/2022   Lab Results  Component Value Date   CHOL 86  08/30/2023   CHOL 115 03/27/2023   CHOL 76 12/22/2022   The ASCVD Risk score (Arnett DK, et al., 2019) failed to calculate for the following reasons:   The valid total cholesterol range is 130 to 320 mg/dL Lab Results  Component Value Date   ALT 21 08/30/2023   AST 25 08/30/2023   ALKPHOS 57 08/30/2023   TSH 2.580 06/26/2023   HGBA1C 5.3 08/30/2023   Improving Your Cholesterol: Diet: Focus on a Mediterranean-style diet, limit saturated fats and sugars, and increase omega-3 fatty acids (fish, flaxseeds,nuts,extra virgin olive oil, avocados). Exercise: Engage in regular physical activity (aerobic exercises are particularly beneficial for HDL). Weight Management: Maintain a healthy weight. Smoking Cessation: Quitting smoking improves cholesterol levels.  Idiopathic gout of left foot, unspecified chronicity Will order lab testing to guide management.  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 Heart rate is well controlled, and there have been no recent chest pain or heart-related symptoms. His last cardiology visit was in March. Emphasized dietary changes to maintain arterial health and reduce myocardial infarction risk. Continue current heart failure management regimen. Encourage dietary changes, including daily use of extra virgin olive oil or avocado. Continue follow-up with cardiologist as needed. Hypogammaglobulinemia (HCC) Low gamma globulin levels were incidentally found on the last visit. It is unclear if this is persistent or transient. Repeat blood work to assess gamma globulin levels. 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 He continues on sertraline  for irritability as per spouse's preference. Continue sertraline  50 mg orally daily. Allergic rhinitis,  unspecified seasonality, unspecified trigger See AVS for management handout.       Orders Placed During this Encounter:   Orders Placed This Encounter  Procedures   IgG, IgA, IgM   CBC with Differential/Platelet   Comprehensive metabolic panel with GFR    [place]    Has the patient fasted?:   No   Lipid panel    [place]    Has the patient fasted?:   No   Hemoglobin A1c    [place]   Vitamin D  (25 hydroxy)   Uric acid         This document was synthesized by artificial intelligence (Abridge) using HIPAA-compliant recording of the clinical interaction;   We discussed the use of AI scribe software for clinical note transcription with the patient, who gave verbal consent to proceed. additional Info: This encounter employed state-of-the-art, real-time, collaborative documentation. The patient actively reviewed and assisted in updating their electronic medical record on a shared screen, ensuring transparency and facilitating joint problem-solving for the problem list, overview, and plan. This approach promotes accurate, informed care. The treatment plan was discussed and reviewed in detail, including medication safety, potential side effects, and all patient questions. We confirmed understanding and comfort with the plan. Follow-up instructions were established, including contacting the office for any concerns, returning if symptoms worsen, persist, or new symptoms develop, and precautions for potential emergency department visits.

## 2023-08-30 NOTE — Patient Instructions (Addendum)
 VISIT SUMMARY:  You came in today for a follow-up and evaluation. We discussed your ongoing health conditions, including your heart failure, depression, hypogammaglobulinemia, and vitamin D  deficiency. We also reviewed your seasonal allergies and general wellness.  YOUR PLAN:  -CHRONIC HEART FAILURE WITH REDUCED EJECTION FRACTION (HFREF): Chronic heart failure with reduced ejection fraction means your heart is not pumping as well as it should. Your condition is stable with no new symptoms. Continue your current heart failure management regimen and follow dietary recommendations, including using extra virgin olive oil or avocado daily. Keep up with your cardiology follow-ups as needed.  -DEPRESSION, MAJOR, IN REMISSION: Your depression is currently in remission, meaning you are not experiencing symptoms. Continue taking sertraline  (Zoloft ) 50 mg daily for irritability as per your spouse's preference.  -HYPOGAMMAGLOBULINEMIA: Hypogammaglobulinemia is a condition where your body has low levels of gamma globulins, which are important for your immune system. We will repeat blood work to see if this condition persists.  -VITAMIN D  DEFICIENCY: Vitamin D  deficiency means you have low levels of vitamin D , which is important for bone health. You are taking supplements, and we will check your vitamin D  levels with blood work.  -WELLNESS VISIT: This was a routine wellness visit. We discussed the importance of participating in Welcome to Medicare visits for your overall health maintenance.  INSTRUCTIONS:  Please complete the repeat blood work to assess your gamma globulin and vitamin D  levels. Continue with your current medications and dietary recommendations. Follow up with your cardiologist as needed and consider participating in Welcome to Medicare visits for ongoing health maintenance.  ALLERGY MANAGEMENT PLAN  This plan is designed to help manage your allergic rhinitis (nasal allergies) effectively.  Follow these steps daily for best results.  3x daily sinus saline sprays  Saline mist into nose leaning over sink at 45 degrees Once daily, after a sinus rinse, use sensimist.  Bedtime usually best. Once daily for xyzal 5 mg in am Take benadryl  25 mg at bedtime also if allergic mucus is persisting      DAILY TREATMENT ROUTINE   Time of Day Treatment Steps  Morning 1. Saline Nasal Spray - Use to cleanse nasal passages 2. Xyzal (levocetirizine) - Take one tablet daily   Throughout Day Saline Nasal Spray - Use 2 additional times (mid-day and afternoon)   Evening/Bedtime 1. Saline Nasal Rinse - Thoroughly clean nasal passages 2. Flonase  Sensimist - Apply after nasal rinse 3. Benadryl  (diphenhydramine ) - Take 25mg  if experiencing persistent congestion    PROPER TECHNIQUE GUIDE       Saline Nasal Spray/Rinse Technique: Lean forward over sink at a 45-degree angle Turn head slightly to one side Insert spray tip into upper nostril Spray gently while breathing lightly through your nose Repeat on other side Gently blow nose to clear excess solution Use saline spray 3 times daily to keep nasal passages moist and clear allergens.       Flonase  Sensimist Technique: Shake bottle gently before each use Prime the bottle if it's new or hasn't been used for a week Tilt your head forward slightly Insert tip into nostril, pointing away from the center of your nose Spray while inhaling gently Repeat in other nostril Use Flonase  Sensimist once daily, preferably at bedtime after using saline rinse. It may take several days of regular use to feel maximum benefit.   WHY FLONASE  SENSIMIST?   Benefits of Flonase  Sensimist:  Alcohol-free and scent-free formula - gentler on sensitive nasal passages Fine mist application -  more comfortable with less dripping down throat Effectively relieves nasal congestion, sneezing, runny nose, and even eye symptoms 24-hour relief with once-daily dosing Uses a  more potent form of fluticasone  that works at a lower dose Less liquid per spray means less discomfort  UNDERSTANDING YOUR MEDICATIONS   Medication How It Works Important Notes  Flonase  Sensimist (fluticasone  furoate) Reduces inflammation in nasal passages, addressing the underlying cause of allergy symptoms - Takes several days for full effect - Use daily for best results - Safe for long-term use   Xyzal (levocetirizine) Blocks histamine to reduce allergy symptoms like sneezing and itching - Take at the same time each day - May cause drowsiness in some people - Once-daily dosing   Benadryl  (diphenhydramine ) Antihistamine that provides additional relief for breakthrough symptoms - Causes drowsiness - Use only at bedtime - For occasional use when needed   Saline Spray/Rinse Physically removes allergens and moistens nasal passages - Safe to use frequently - Improves effectiveness of other treatments - Reduces nasal irritation    CONTACT YOUR PROVIDER IF: Your symptoms do not improve after 1-2 weeks of following this plan You develop sinus pain with fever or green/yellow discharge You experience frequent nosebleeds You develop new or worsening symptoms You have questions about your treatment plan     ADDITIONAL ALLERGY MANAGEMENT TIPS   HELPFUL STRATEGIES: ?? Keep windows closed during high pollen seasons ??? Use allergen-proof covers for pillows and mattresses ?? Vacuum regularly with a HEPA filter vacuum ?? Shower and change clothes after spending time outdoors ?? Check local pollen counts and limit outdoor time when counts are high ?? Stay well-hydrated to help keep mucous membranes moist

## 2023-08-31 ENCOUNTER — Encounter: Payer: Self-pay | Admitting: Internal Medicine

## 2023-08-31 ENCOUNTER — Other Ambulatory Visit: Payer: Self-pay | Admitting: Internal Medicine

## 2023-08-31 DIAGNOSIS — I429 Cardiomyopathy, unspecified: Secondary | ICD-10-CM

## 2023-08-31 DIAGNOSIS — E7849 Other hyperlipidemia: Secondary | ICD-10-CM

## 2023-08-31 LAB — IGG, IGA, IGM
IgG (Immunoglobin G), Serum: 520 mg/dL — ABNORMAL LOW (ref 600–1540)
IgM, Serum: 47 mg/dL — ABNORMAL LOW (ref 50–300)
Immunoglobulin A: 150 mg/dL (ref 70–320)

## 2023-08-31 NOTE — Assessment & Plan Note (Signed)
 Following with cardiology.

## 2023-08-31 NOTE — Assessment & Plan Note (Signed)
 Chronic heart failure with reduced ejection fraction is stable with no new symptoms. Heart rate is well controlled, and there have been no recent chest pain or heart-related symptoms. His last cardiology visit was in March. Emphasized dietary changes to maintain arterial health and reduce myocardial infarction risk. Continue current heart failure management regimen. Encourage dietary changes, including daily use of extra virgin olive oil or avocado. Continue follow-up with cardiologist as needed.

## 2023-08-31 NOTE — Assessment & Plan Note (Signed)
 Medications: not yet discussed Lab Results  Component Value Date   HDL 54.60 08/30/2023   HDL 49.30 03/27/2023   HDL 38.90 (L) 12/22/2022   CHOLHDL 2 08/30/2023   CHOLHDL 2 03/27/2023   CHOLHDL 2 12/22/2022   Lab Results  Component Value Date   LDLCALC 14 08/30/2023   LDLCALC 48 03/27/2023   LDLCALC 18 12/22/2022   LDLDIRECT 76.0 06/11/2020   Lab Results  Component Value Date   TRIG 87.0 08/30/2023   TRIG 90.0 03/27/2023   TRIG 97.0 12/22/2022   Lab Results  Component Value Date   CHOL 86 08/30/2023   CHOL 115 03/27/2023   CHOL 76 12/22/2022   The ASCVD Risk score (Arnett DK, et al., 2019) failed to calculate for the following reasons:   The valid total cholesterol range is 130 to 320 mg/dL Lab Results  Component Value Date   ALT 21 08/30/2023   AST 25 08/30/2023   ALKPHOS 57 08/30/2023   TSH 2.580 06/26/2023   HGBA1C 5.3 08/30/2023   Improving Your Cholesterol: Diet: Focus on a Mediterranean-style diet, limit saturated fats and sugars, and increase omega-3 fatty acids (fish, flaxseeds,nuts,extra virgin olive oil, avocados). Exercise: Engage in regular physical activity (aerobic exercises are particularly beneficial for HDL). Weight Management: Maintain a healthy weight. Smoking Cessation: Quitting smoking improves cholesterol levels.

## 2023-08-31 NOTE — Assessment & Plan Note (Signed)
 Previously diagnosed vitamin D  deficiency is being managed with supplements. Improvement in vitamin D  levels is anticipated. Check vitamin D  levels with blood work.

## 2023-08-31 NOTE — Progress Notes (Signed)
 Labs all stable except for the red blood cells are too large. Recommend  reduce and eliminate alcohol and supplement b complex vitamin and multivitamin daily

## 2023-08-31 NOTE — Assessment & Plan Note (Signed)
 Low gamma globulin levels were incidentally found on the last visit. It is unclear if this is persistent or transient. Repeat blood work to assess gamma globulin levels.

## 2023-08-31 NOTE — Assessment & Plan Note (Signed)
 Will order lab testing to guide management.

## 2023-08-31 NOTE — Assessment & Plan Note (Signed)
 See AVS for management handout.

## 2023-09-02 NOTE — Progress Notes (Signed)
 Your recent lab results show slightly low immunoglobulin levels, which is likely related to your heart medication. Since you're not experiencing frequent infections and your IgA level (important for respiratory protection) is normal, this is a low-risk finding we can monitor at your next regular visit in 6 months. Please contact us  sooner if you develop frequent or severe infections (more than 3-4 per year), infections requiring antibiotics, or unusual infections. Otherwise, we'll recheck your levels at your next visit in November.

## 2023-09-04 ENCOUNTER — Other Ambulatory Visit (HOSPITAL_COMMUNITY): Payer: Self-pay

## 2023-09-04 ENCOUNTER — Telehealth: Payer: Self-pay

## 2023-09-04 NOTE — Telephone Encounter (Signed)
 Pharmacy Patient Advocate Encounter   Received notification from Onbase that prior authorization for Repatha  140MG /ML syringes is required/requested.   Insurance verification completed.   The patient is insured through [employer] [facility] .   Per test claim: PA required; PA submitted to above mentioned insurance via CoverMyMeds Key/confirmation #/EOC [id number] Status is pending

## 2023-09-04 NOTE — Telephone Encounter (Signed)
 Pharmacy Patient Advocate Encounter  Received notification from [employer] [facility] that Prior Authorization for Repatha  140mg /ml  has been APPROVED from 05/02/23 to 09/03/24   PA #/Case ID/Reference #: [id number]    Approval letter indexed to media tab

## 2023-09-17 ENCOUNTER — Ambulatory Visit: Payer: Medicare Other

## 2023-09-17 DIAGNOSIS — I428 Other cardiomyopathies: Secondary | ICD-10-CM | POA: Diagnosis not present

## 2023-09-17 DIAGNOSIS — I5022 Chronic systolic (congestive) heart failure: Secondary | ICD-10-CM

## 2023-09-18 LAB — CUP PACEART REMOTE DEVICE CHECK
Battery Remaining Longevity: 77 mo
Battery Remaining Percentage: 89 %
Battery Voltage: 2.96 V
Brady Statistic AP VP Percent: 97 %
Brady Statistic AP VS Percent: 2.8 %
Brady Statistic AS VP Percent: 1 %
Brady Statistic AS VS Percent: 1 %
Brady Statistic RA Percent Paced: 99 %
Date Time Interrogation Session: 20250518221327
HighPow Impedance: 78 Ohm
Implantable Lead Connection Status: 753985
Implantable Lead Connection Status: 753985
Implantable Lead Connection Status: 753985
Implantable Lead Implant Date: 20241115
Implantable Lead Implant Date: 20241115
Implantable Lead Implant Date: 20241115
Implantable Lead Location: 753858
Implantable Lead Location: 753859
Implantable Lead Location: 753860
Implantable Pulse Generator Implant Date: 20241115
Lead Channel Impedance Value: 410 Ohm
Lead Channel Impedance Value: 490 Ohm
Lead Channel Impedance Value: 910 Ohm
Lead Channel Pacing Threshold Amplitude: 0.5 V
Lead Channel Pacing Threshold Amplitude: 0.75 V
Lead Channel Pacing Threshold Amplitude: 1.75 V
Lead Channel Pacing Threshold Pulse Width: 0.5 ms
Lead Channel Pacing Threshold Pulse Width: 0.5 ms
Lead Channel Pacing Threshold Pulse Width: 0.5 ms
Lead Channel Sensing Intrinsic Amplitude: 11.4 mV
Lead Channel Sensing Intrinsic Amplitude: 4.3 mV
Lead Channel Setting Pacing Amplitude: 1.5 V
Lead Channel Setting Pacing Amplitude: 2 V
Lead Channel Setting Pacing Amplitude: 2.25 V
Lead Channel Setting Pacing Pulse Width: 0.5 ms
Lead Channel Setting Pacing Pulse Width: 0.5 ms
Lead Channel Setting Sensing Sensitivity: 0.5 mV
Pulse Gen Serial Number: 810108245
Zone Setting Status: 755011

## 2023-09-20 ENCOUNTER — Ambulatory Visit: Payer: Self-pay | Admitting: Cardiology

## 2023-09-23 ENCOUNTER — Other Ambulatory Visit: Payer: Self-pay | Admitting: Internal Medicine

## 2023-09-23 DIAGNOSIS — I2510 Atherosclerotic heart disease of native coronary artery without angina pectoris: Secondary | ICD-10-CM

## 2023-09-23 DIAGNOSIS — M1000 Idiopathic gout, unspecified site: Secondary | ICD-10-CM

## 2023-09-27 ENCOUNTER — Other Ambulatory Visit: Payer: Self-pay | Admitting: Internal Medicine

## 2023-09-27 DIAGNOSIS — E7849 Other hyperlipidemia: Secondary | ICD-10-CM

## 2023-09-27 DIAGNOSIS — I429 Cardiomyopathy, unspecified: Secondary | ICD-10-CM

## 2023-09-27 NOTE — Progress Notes (Unsigned)
 Cardiology Office Note:  .   Date:  09/28/2023  ID:  Isaiah Jones, DOB [birth year], MRN [id number] PCP: Anthon Kins, MD  [facility] Providers Cardiologist:  Oneil Bigness, MD Electrophysiologist:  Lei Pump, MD { History of Present Illness: .    Chief Complaint  Patient presents with   Follow-up    Isaiah Jones is a 73 y.o. male with history of persistent Afib, CHF, CRT-D, LBBB who presents for follow-up.    History of Present Illness   Isaiah Jones is a 73 year old male with systolic heart failure and persistent atrial fibrillation who presents for follow-up after cardioversion.  He is currently feeling fairly well and remains in rhythm following the recent successful cardioversion. He wants to resume bicycling, a hobby he enjoyed in the early 1990s, but is apprehensive due to concerns about his heart condition. He currently rides alone on a trail and aims to improve his speed with each ride. He is considering trying an exercise bike at a gym where others are present for safety.  No chest pain, trouble breathing, or swelling in his legs. He is on a heart failure regimen that includes spironolactone  25 mg daily, Entresto  49/51 mg BID, metoprolol  succinate 50 mg daily, and Jardiance  10 mg daily. He is not requiring any diuretics at this time.  For his atrial fibrillation, he continues on amiodarone  and Xarelto  for anticoagulation. He reports no symptoms of congestive heart failure and is getting more biventricular pacing since being in rhythm. His most recent ejection fraction (EF) was 25-30%.  He also has a history of hyperlipidemia, with his most recent LDL well controlled at 14. He continues on Crestor  40 mg daily.          Problem List Systolic HF s/p CRT-D -2/2 Afib/LBBB? 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 Persistent Afib  -DCCV  05/17/2023 4. LBBB -QRS 154 ms 5. HLD -T chol 86, HDL 54, LDL 14, TG 87 -A1c 5.5    ROS: All other ROS reviewed and negative. Pertinent positives noted in the HPI.     Studies Reviewed: Aaron Aas       TTE 02/22/2023  1. Left ventricular ejection fraction, by estimation, is 25 to 30%. The  left ventricle has severely decreased function. The left ventricle  demonstrates global hypokinesis. The left ventricular internal cavity size  was mildly dilated. There is mild left  ventricular hypertrophy. Left ventricular diastolic function could not be  evaluated.   2. Right ventricular systolic function is normal. The right ventricular  size is mildly enlarged. Tricuspid regurgitation signal is inadequate for  assessing PA pressure.   3. Left atrial size was severely dilated.   4. Right atrial size was severely dilated.   5. The mitral valve is normal in structure. Mild mitral valve  regurgitation. No evidence of mitral stenosis.   6. The aortic valve is tricuspid. Aortic valve regurgitation is not  visualized. Aortic valve sclerosis is present, with no evidence of aortic  valve stenosis.   7. The inferior vena cava is normal in size with greater than 50%  respiratory variability, suggesting right atrial pressure of 3 mmHg.  Physical Exam:   VS:  BP 102/72   Pulse 81   Ht 6\' 2"  (1.88 m)   Wt 257 lb (116.6 kg)   SpO2 97%   BMI 33.00 kg/m  Wt Readings from Last 3 Encounters:  09/28/23 257 lb (116.6 kg)  08/30/23 257 lb 6.4 oz (116.8 kg)  07/17/23 266 lb 12.8 oz (121 kg)    GEN: Well nourished, well developed in no acute distress NECK: No JVD; No carotid bruits CARDIAC: RRR, no murmurs, rubs, gallops RESPIRATORY:  Clear to auscultation without rales, wheezing or rhonchi  ABDOMEN: Soft, non-tender, non-distended EXTREMITIES:  No edema; No deformity  ASSESSMENT AND PLAN: .   Assessment and Plan    Systolic heart failure, EF 25-30% Systolic heart failure with improved rhythm control.  Ejection fraction 25-30%, influenced by atrial fibrillation and left bundle branch block. Biventricular pacing beneficial. - Order heart ultrasound to assess heart function improvement. - Continue spironolactone  25 mg daily, Entresto  49/51 mg BID, metoprolol  succinate 50 mg daily, Jariance 10 mg daily. - Schedule follow-up in one year. - Suspect EF will improve with NSR and BiV pacing >98%  Left bundle branch block Left bundle branch block contributing to heart failure. Improved with biventricular pacing. QRS duration has decreased  Persistent atrial fibrillation Persistent atrial fibrillation post successful cardioversion. Currently in rhythm with amiodarone . Anticoagulation with Xarelto  ongoing. Exercise discussed, recommended controlled environment. - Continue amiodarone  therapy. - Continue Xarelto  for anticoagulation.  Hyperlipidemia Hyperlipidemia controlled. LDL 14 mg/dL. - Continue Crestor  40 mg daily.              Follow-up: Return in about 1 year (around 09/27/2024).  Signed, Gigi Kyle. Rolm Clos, MD, [facility]  [facility]  [address] [place], [place] [address] [phone]  8:50 AM

## 2023-09-28 ENCOUNTER — Ambulatory Visit: Payer: Medicare Other | Attending: Cardiovascular Disease | Admitting: Cardiovascular Disease

## 2023-09-28 ENCOUNTER — Encounter: Payer: Self-pay | Admitting: Cardiovascular Disease

## 2023-09-28 VITALS — BP 102/72 | HR 81 | Ht 74.0 in | Wt 257.0 lb

## 2023-09-28 DIAGNOSIS — I428 Other cardiomyopathies: Secondary | ICD-10-CM

## 2023-09-28 DIAGNOSIS — I4819 Other persistent atrial fibrillation: Secondary | ICD-10-CM | POA: Diagnosis not present

## 2023-09-28 DIAGNOSIS — I447 Left bundle-branch block, unspecified: Secondary | ICD-10-CM | POA: Diagnosis not present

## 2023-09-28 DIAGNOSIS — Z95810 Presence of automatic (implantable) cardiac defibrillator: Secondary | ICD-10-CM

## 2023-09-28 DIAGNOSIS — I5022 Chronic systolic (congestive) heart failure: Secondary | ICD-10-CM

## 2023-09-28 NOTE — Patient Instructions (Signed)
 Medication Instructions:  Your physician recommends that you continue on your current medications as directed. Please refer to the Current Medication list given to you today.    *If you need a refill on your cardiac medications before your next appointment, please call your pharmacy*   Lab Work: None    If you have labs (blood work) drawn today and your tests are completely normal, you will receive your results only by: MyChart Message (if you have MyChart) OR A paper copy in the mail If you have any lab test that is abnormal or we need to change your treatment, we will call you to review the results.   Testing/Procedures: Echo will be scheduled at [address].  Your physician has requested that you have an echocardiogram. Echocardiography is a painless test that uses sound waves to create images of your heart. It provides your doctor with information about the size and shape of your heart and how well your heart's chambers and valves are working. This procedure takes approximately one hour. There are no restrictions for this procedure. Please do NOT wear cologne, perfume, aftershave, or lotions (deodorant is allowed). Please arrive 15 minutes prior to your appointment time.    Follow-Up: At [facility], you and your health needs are our priority.  As part of our continuing mission to provide you with exceptional heart care, we have created designated Provider Care Teams.  These Care Teams include your primary Cardiologist (physician) and Advanced Practice Providers (APPs -  Physician Assistants and Nurse Practitioners) who all work together to provide you with the care you need, when you need it.  We recommend signing up for the patient portal called "MyChart".  Sign up information is provided on this After Visit Summary.  MyChart is used to connect with patients for Virtual Visits (Telemedicine).  Patients are able to view lab/test results, encounter notes, upcoming  appointments, etc.  Non-urgent messages can be sent to your provider as well.   To learn more about what you can do with MyChart, go to [email or web address].    Your next appointment:   1 year(s)  The format for your next appointment:   In Person  Provider:   Reatha Harps, MD    Other Instructions

## 2023-09-29 ENCOUNTER — Other Ambulatory Visit: Payer: Self-pay | Admitting: Internal Medicine

## 2023-09-29 DIAGNOSIS — M109 Gout, unspecified: Secondary | ICD-10-CM

## 2023-10-23 ENCOUNTER — Other Ambulatory Visit: Payer: Self-pay | Admitting: Internal Medicine

## 2023-10-23 DIAGNOSIS — I429 Cardiomyopathy, unspecified: Secondary | ICD-10-CM

## 2023-10-23 DIAGNOSIS — E7849 Other hyperlipidemia: Secondary | ICD-10-CM

## 2023-10-25 ENCOUNTER — Other Ambulatory Visit: Payer: Self-pay | Admitting: Internal Medicine

## 2023-10-25 DIAGNOSIS — F325 Major depressive disorder, single episode, in full remission: Secondary | ICD-10-CM

## 2023-11-01 NOTE — Addendum Note (Signed)
 Addended by: TAWNI DRILLING D on: 11/01/2023 04:31 PM   Modules accepted: Orders

## 2023-11-01 NOTE — Progress Notes (Signed)
 Remote ICD transmission.

## 2023-11-07 ENCOUNTER — Ambulatory Visit (HOSPITAL_COMMUNITY)
Admission: RE | Admit: 2023-11-07 | Discharge: 2023-11-07 | Disposition: A | Discharge status: Home / Self Care | Source: Ambulatory Visit | Attending: Cardiovascular Disease | Admitting: Cardiovascular Disease

## 2023-11-07 DIAGNOSIS — I5022 Chronic systolic (congestive) heart failure: Secondary | ICD-10-CM | POA: Insufficient documentation

## 2023-11-07 LAB — ECHOCARDIOGRAM COMPLETE: S' Lateral: 3.92 cm

## 2023-11-11 ENCOUNTER — Ambulatory Visit: Payer: Self-pay | Admitting: Cardiovascular Disease

## 2023-11-18 ENCOUNTER — Other Ambulatory Visit: Payer: Self-pay | Admitting: Internal Medicine

## 2023-11-18 DIAGNOSIS — E7849 Other hyperlipidemia: Secondary | ICD-10-CM

## 2023-11-18 DIAGNOSIS — I429 Cardiomyopathy, unspecified: Secondary | ICD-10-CM

## 2023-11-22 ENCOUNTER — Other Ambulatory Visit: Payer: Self-pay | Admitting: Internal Medicine

## 2023-11-22 DIAGNOSIS — I429 Cardiomyopathy, unspecified: Secondary | ICD-10-CM

## 2023-11-22 DIAGNOSIS — E7849 Other hyperlipidemia: Secondary | ICD-10-CM

## 2023-12-17 ENCOUNTER — Ambulatory Visit (INDEPENDENT_AMBULATORY_CARE_PROVIDER_SITE_OTHER): Payer: Medicare Other

## 2023-12-17 DIAGNOSIS — I428 Other cardiomyopathies: Secondary | ICD-10-CM | POA: Diagnosis not present

## 2023-12-19 LAB — CUP PACEART REMOTE DEVICE CHECK
Battery Remaining Longevity: 74 mo
Battery Remaining Percentage: 85 %
Battery Voltage: 2.96 V
Brady Statistic AP VP Percent: 97 %
Brady Statistic AP VS Percent: 2.5 %
Brady Statistic AS VP Percent: 1 %
Brady Statistic AS VS Percent: 1 %
Brady Statistic RA Percent Paced: 99 %
Date Time Interrogation Session: 20250818020130
HighPow Impedance: 72 Ohm
Implantable Lead Connection Status: 753985
Implantable Lead Connection Status: 753985
Implantable Lead Connection Status: 753985
Implantable Lead Implant Date: 20241115
Implantable Lead Implant Date: 20241115
Implantable Lead Implant Date: 20241115
Implantable Lead Location: 753858
Implantable Lead Location: 753859
Implantable Lead Location: 753860
Implantable Pulse Generator Implant Date: 20241115
Lead Channel Impedance Value: 400 Ohm
Lead Channel Impedance Value: 490 Ohm
Lead Channel Impedance Value: 890 Ohm
Lead Channel Pacing Threshold Amplitude: 0.5 V
Lead Channel Pacing Threshold Amplitude: 0.75 V
Lead Channel Pacing Threshold Amplitude: 2 V
Lead Channel Pacing Threshold Pulse Width: 0.5 ms
Lead Channel Pacing Threshold Pulse Width: 0.5 ms
Lead Channel Pacing Threshold Pulse Width: 0.5 ms
Lead Channel Sensing Intrinsic Amplitude: 4.3 mV
Lead Channel Sensing Intrinsic Amplitude: 5.5 mV
Lead Channel Setting Pacing Amplitude: 1.5 V
Lead Channel Setting Pacing Amplitude: 2 V
Lead Channel Setting Pacing Amplitude: 2.5 V
Lead Channel Setting Pacing Pulse Width: 0.5 ms
Lead Channel Setting Pacing Pulse Width: 0.5 ms
Lead Channel Setting Sensing Sensitivity: 0.5 mV
Pulse Gen Serial Number: 810108245
Zone Setting Status: 755011

## 2023-12-20 ENCOUNTER — Ambulatory Visit: Payer: Self-pay | Admitting: Cardiology

## 2024-01-03 ENCOUNTER — Encounter: Payer: Self-pay | Admitting: Cardiology

## 2024-01-06 ENCOUNTER — Other Ambulatory Visit: Payer: Self-pay | Admitting: Cardiovascular Disease

## 2024-01-06 DIAGNOSIS — I4819 Other persistent atrial fibrillation: Secondary | ICD-10-CM

## 2024-01-07 ENCOUNTER — Ambulatory Visit: Admitting: Internal Medicine

## 2024-01-07 VITALS — BP 110/70 | HR 75 | Temp 97.8°F | Ht 74.0 in | Wt 237.6 lb

## 2024-01-07 DIAGNOSIS — L03114 Cellulitis of left upper limb: Secondary | ICD-10-CM | POA: Diagnosis not present

## 2024-01-07 DIAGNOSIS — R1031 Right lower quadrant pain: Secondary | ICD-10-CM

## 2024-01-07 DIAGNOSIS — G5791 Unspecified mononeuropathy of right lower limb: Secondary | ICD-10-CM

## 2024-01-07 MED ORDER — NYSTATIN 100000 UNIT/GM EX POWD: 1.0000 | Freq: Three times a day (TID) | CUTANEOUS | 11 refills | Status: DC

## 2024-01-07 MED ORDER — CEPHALEXIN 500 MG PO CAPS: 500.0000 mg | ORAL_CAPSULE | Freq: Three times a day (TID) | ORAL | 0 refills | Status: DC

## 2024-01-07 MED ORDER — GABAPENTIN 300 MG PO CAPS: 300.0000 mg | ORAL_CAPSULE | Freq: Three times a day (TID) | ORAL | 3 refills | Status: DC

## 2024-01-07 NOTE — Progress Notes (Signed)
 ==============================  [facility]: [id number]   -- Medical Office Visit --  Patient: Isaiah Jones      Age: 73 y.o.       Sex:  male  Date:   01/07/2024 Today's Healthcare Provider: Bernardino KANDICE Cone, MD  ==============================   Chief Complaint: Groin Pain (Pt states having sharp pains for about 3  weeks now and burning inside the into of the right groin area/ leg.)   Discussed the use of AI scribe software for clinical note transcription with the patient, who gave verbal consent to proceed.  History of Present Illness 73 year old male who presents with severe intermittent pain in the right inguinal region.  He has been experiencing severe intermittent pain in the right inguinal region for the past three weeks. The pain is described as a sharp, burning sensation that occurs suddenly and can be very severe, sometimes immobilizing him. It is located between his leg and belly, beneath the inguinal ligament, and is not associated with any specific activity or movement. No recent heavy lifting, but he mentions a period of frequent sneezing and bicycle riding. No imaging studies have been conducted since the onset of the pain.  He has a history of significant weight loss, having reduced his weight from 318 pounds to 237 pounds, with a current home weight of 234 pounds. He aims to reach 220 pounds but notes that his wife feels he has lost enough weight. He is currently taking a medication for weight management, referred to as 'Regulon'.  He reports multiple skin issues, including numerous tiny skin scabs on his legs that appear infected, and a new spot on his arm that developed after a minor injury. He attributes some of these skin issues to working outside and sustaining minor injuries.  During the review of symptoms, no specific movements exacerbate the inguinal pain, and he does not report any bulging in the area.  a few weeks has  severe pain flares intermittently in his inguinal ligament on the right and beneath it but not really at the skin level there is no specific activity or movement that flares it but it can just come and go and be very severe at times physical exam I could not feel any hernia even with cough there was some slight redness along the inguinal ligament where there is pannus and moisture but it is minimal and there is not fungal rash he has severe chronic venous insufficiency both legs and numerous tiny skin scabs that look infected on his legs.    Wt Readings from Last 50 Encounters:  01/07/24 237 lb 9.6 oz (107.8 kg)  09/28/23 257 lb (116.6 kg)  08/30/23 257 lb 6.4 oz (116.8 kg)  07/17/23 266 lb 12.8 oz (121 kg)  06/26/23 270 lb 12.8 oz (122.8 kg)  06/19/23 274 lb 9.6 oz (124.6 kg)  05/17/23 263 lb (119.3 kg)  05/03/23 275 lb 9.6 oz (125 kg)  03/27/23 276 lb 9.6 oz (125.5 kg)  03/16/23 262 lb (118.8 kg)  02/07/23 267 lb 6.4 oz (121.3 kg)  01/22/23 264 lb (119.7 kg)  01/12/23 262 lb (118.8 kg)  01/10/23 264 lb 12.8 oz (120.1 kg)  12/26/22 275 lb (124.7 kg)  12/22/22 278 lb (126.1 kg)  12/06/22 276 lb (125.2 kg)  10/30/22 288 lb 9.6 oz (130.9 kg)  10/11/22 286 lb 12.8 oz (130.1 kg)  10/02/22 293 lb 3.2 oz (133 kg)  10/02/22 289 lb (131.1 kg)  09/22/22 289 lb (131.1 kg)  09/18/22 294 lb (133.4 kg)  07/25/22 (!) 301 lb (136.5 kg)  06/16/22 (!) 312 lb 3.2 oz (141.6 kg)  06/13/22 (!) 313 lb (142 kg)  05/26/22 (!) 316 lb (143.3 kg)  05/19/22 (!) 315 lb 3.2 oz (143 kg)  05/19/22 (!) 318 lb 9.6 oz (144.5 kg)  04/19/22 (!) 317 lb 6.4 oz (144 kg)  03/27/22 (!) 311 lb 3.2 oz (141.2 kg)  02/17/22 (!) 308 lb 12.8 oz (140.1 kg)  01/05/22 300 lb (136.1 kg)  11/24/21 (!) 309 lb (140.2 kg)  07/25/21 (!) 305 lb (138.3 kg)  06/14/21 (!) 306 lb 12.8 oz (139.2 kg)  03/04/21 296 lb (134.3 kg)  02/22/21 296 lb (134.3 kg)  01/24/21 295 lb 9.6 oz (134.1 kg)  12/20/20 293 lb 6.4 oz (133.1 kg)  06/11/20  300 lb (136.1 kg)  02/27/20 273 lb 3.2 oz (123.9 kg)  02/12/20 273 lb 12.8 oz (124.2 kg)  12/10/19 281 lb 3.2 oz (127.6 kg)  11/19/19 279 lb (126.6 kg)  10/21/19 289 lb 14.5 oz (131.5 kg)  10/17/19 290 lb (131.5 kg)  10/14/19 290 lb (131.5 kg)  10/02/19 299 lb 8 oz (135.9 kg)  10/02/19 299 lb 12.8 oz (136 kg)   BMI Readings from Last 50 Encounters:  01/07/24 30.51 kg/m  09/28/23 33.00 kg/m  08/30/23 33.05 kg/m  07/17/23 34.26 kg/m  06/26/23 34.77 kg/m  06/19/23 35.26 kg/m  05/17/23 33.77 kg/m  05/03/23 35.38 kg/m  03/27/23 35.51 kg/m  03/16/23 33.64 kg/m  02/07/23 34.33 kg/m  01/22/23 33.90 kg/m  01/12/23 33.64 kg/m  01/10/23 34.00 kg/m  12/26/22 35.31 kg/m  12/22/22 35.69 kg/m  12/06/22 35.44 kg/m  10/30/22 36.07 kg/m  10/11/22 35.85 kg/m  10/02/22 36.65 kg/m  10/02/22 36.12 kg/m  09/22/22 36.12 kg/m  09/18/22 36.75 kg/m  07/25/22 37.62 kg/m  06/16/22 39.02 kg/m  06/13/22 39.12 kg/m  05/26/22 39.50 kg/m  05/19/22 39.40 kg/m  05/19/22 40.91 kg/m  04/19/22 40.75 kg/m  03/27/22 39.96 kg/m  02/17/22 39.65 kg/m  01/05/22 38.52 kg/m  11/24/21 39.67 kg/m  07/25/21 39.16 kg/m  06/14/21 39.39 kg/m  03/04/21 38.00 kg/m  02/22/21 38.00 kg/m  01/24/21 37.95 kg/m  12/20/20 37.67 kg/m  06/11/20 38.52 kg/m  02/27/20 35.08 kg/m  02/12/20 35.15 kg/m  12/10/19 36.10 kg/m  11/19/19 35.82 kg/m  10/21/19 37.22 kg/m  10/17/19 37.23 kg/m  10/14/19 37.23 kg/m  10/02/19 38.45 kg/m  10/02/19 38.49 kg/m     Background Reviewed: Problem List: has Atrial fibrillation, persistent; OSA on CPAP; Dyslipidemia; Erectile dysfunction; Essential hypertension; Gout; Osteoarthritis; Seasonal allergies; Depression, major, in remission (HCC) with anxiety; Vitamin D  deficiency; S/P laparoscopic sleeve gastrectomy; Primary localized osteoarthritis of right hip; Former smoker; Chronic cough; Dysphagia; Hyperlipidemia; Hypertriglyceridemia; LBBB  (left bundle branch block); 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); Anticoagulant long-term use; Achalasia of esophagus; Chronic HFrEF (heart failure with reduced ejection fraction) (HCC); Presbycusis; Laryngopharyngeal reflux (LPR); Insomnia with sleep apnea; History of arthritis; Foot pain, right; Hematuria; Allergic rhinitis; Weight disorder; Hypertension; At risk for falls; CAD (coronary artery disease); Calcium  oxalate kidney stones; Chronic kidney disease, stage 2 (mild); Structural heart disease; Abnormal EKG; Alcohol use; Nonischemic cardiomyopathy (HCC); Hypogammaglobulinemia (HCC); and Presence of heart assist device (HCC) on their problem list. Past Medical History:  has a past medical history of Anxiety, Arthritis, degenerative, Benign hypertensive heart disease without congestive heart failure, BMI 45.0-49.9, adult (HCC) (03/09/2018), CHF (congestive heart failure) (HCC), Chronic kidney disease, Colon  polyps, Complication of anesthesia, Depression, Dyslipidemia, Dysrhythmia (2017), GERD (gastroesophageal reflux disease), Glaucoma, History of 2019 novel coronavirus disease (COVID-19) (06/2019), History of kidney stones, Hypertension, Hypertriglyceridemia (03/14/2022), OSA on CPAP, Persistent atrial fibrillation (HCC), Personal history of gout (05/23/2019), Pneumonia, PONV (postoperative nausea and vomiting), and Rhinitis, allergic. Past Surgical History:   has a past surgical history that includes Replacement total knee (Bilateral, 2008); Vasectomy; Tonsillectomy; arthroscopic knee surgery (Bilateral, 2003); Cardioversion (N/A, 08/10/2016); Laparoscopic gastric sleeve resection (N/A, 07/15/2019); Colonoscopy; Total hip arthroplasty (Right, 10/21/2019); Cystoscopy with retrograde pyelogram, ureteroscopy and stent placement (Left, 03/04/2021); Holmium laser application (Left, 03/04/2021); Esophageal manometry (N/A, 03/22/2022); RIGHT/LEFT HEART CATH AND CORONARY ANGIOGRAPHY (N/A,  09/22/2022); CORONARY PRESSURE/FFR STUDY (N/A, 09/22/2022); Cystoscopy with retrograde pyelogram, ureteroscopy and stent placement (Right, 01/12/2023); BIV ICD INSERTION CRT-D (N/A, 03/16/2023); Implantable cardioverter defibrillator generator change (03/16/2023); and CARDIOVERSION (N/A, 05/17/2023). Social History:   reports that he quit smoking about 22 years ago. His smoking use included cigarettes. He has been exposed to tobacco smoke. He has never used smokeless tobacco. He reports current alcohol use. He reports that he does not use drugs. Family History:  family history includes Alzheimer's disease in his mother; Arthritis in his father and mother; Esophageal cancer in his father; Heart attack in his mother; Heart attack (age of onset: 76) in his father; Hyperlipidemia in his brother, father, and mother; Hypertension in his brother, father, and mother; Other in his brother. Allergies:  is allergic to erythromycin.   Medication Reconciliation: Current Outpatient Medications on File Prior to Visit  Medication Sig   acetaminophen  (TYLENOL ) 500 MG tablet Take 500-1,000 mg by mouth every 6 (six) hours as needed for headache.   allopurinol  (ZYLOPRIM ) 100 MG tablet TAKE 1 TABLET DAILY . DO NOT START UNTIL STEROIDS HAVED CALMED DOWN THE ATTACK, BUT DO NOT STOP IF AN ATTACK RETURNS   allopurinol  (ZYLOPRIM ) 300 MG tablet TAKE 1 TABLET DAILY   amiodarone  (PACERONE ) 200 MG tablet Take 1 tablet (200 mg total) by mouth daily.   calcium  citrate (CALCITRATE - DOSED IN MG ELEMENTAL CALCIUM ) 950 (200 Ca) MG tablet Take 200 mg of elemental calcium  by mouth 3 (three) times daily.   Cholecalciferol (VITAMIN D3) 125 MCG (5000 UT) TABS Take 5,000 Units by mouth daily.   colchicine  0.6 MG tablet Take 1 tablet (0.6 mg total) by mouth daily. (Patient taking differently: Take 0.6 mg by mouth daily as needed (gout flares).)   Cyanocobalamin  (VITAMIN B-12) 5000 MCG SUBL Place under the tongue.    diphenhydramine -acetaminophen  (TYLENOL  PM) 25-500 MG TABS tablet Take 2 tablets by mouth at bedtime.    empagliflozin  (JARDIANCE ) 10 MG TABS tablet Take 1 tablet (10 mg total) by mouth daily before breakfast.   fexofenadine (ALLEGRA) 180 MG tablet Take 180 mg by mouth daily as needed for allergies or rhinitis.   Ginkgo 60 MG TABS Take 60 mg by mouth daily.   ipratropium-albuterol  (DUONEB) 0.5-2.5 (3) MG/3ML SOLN Take 3 mLs by nebulization every 4 (four) hours as needed.   methocarbamol  (ROBAXIN -750) 750 MG tablet Take 1 tablet (750 mg total) by mouth 4 (four) times daily. (Patient not taking: Reported on 09/28/2023)   metoprolol  succinate (TOPROL -XL) 50 MG 24 hr tablet Take 1 tablet (50 mg total) by mouth daily. Take with or immediately following a meal.   mometasone (NASONEX) 50 MCG/ACT nasal spray Place 1 spray into the nose at bedtime.   Multiple Vitamin (MULTIVITAMIN WITH MINERALS) TABS tablet Take 1 tablet by mouth 2 (two) times daily. Bariatric  Multivitamin   Multiple Vitamins-Minerals (AIRBORNE PO) Take 1 tablet by mouth daily as needed (immune health).    Omega-3 Fatty Acids (FISH OIL PO) Take 60 capsules by mouth daily. Krill oil   REPATHA  SURECLICK 140 MG/ML SOAJ INJECT 140MG  INTO THE SKIN EVERY 14 DAYS   rosuvastatin  (CRESTOR ) 40 MG tablet TAKE 1 TABLET DAILY   sacubitril-valsartan  (ENTRESTO ) 49-51 MG Take 1 tablet by mouth 2 (two) times daily.   Semaglutide -Weight Management (WEGOVY ) 2.4 MG/0.75ML SOAJ Inject 2.4 mg into the skin once a week.   sertraline  (ZOLOFT ) 50 MG tablet TAKE 1 TABLET DAILY   spironolactone  (ALDACTONE ) 25 MG tablet TAKE 1/2 TABLET(12.5MG      TOTAL) DAILY   thiamine (VITAMIN B-1) 100 MG tablet Take 100 mg by mouth daily.   XARELTO  20 MG TABS tablet TAKE 1 TABLET DAILY WITH   SUPPER   No current facility-administered medications on file prior to visit.   Medications Discontinued During This Encounter  Medication Reason   amoxicillin  (AMOXIL ) 250 MG capsule  Completed Course     Physical Exam:    01/07/2024   11:28 AM 09/28/2023    8:34 AM 08/30/2023    7:54 AM  Vitals with BMI  Height 6' 2 6' 2 6' 2  Weight 237 lbs 10 oz 257 lbs 257 lbs 6 oz  BMI 30.49 32.98 33.03  Systolic 110 102 889  Diastolic 70 72 70  Pulse 75 81 81  Vital signs reviewed.  Nursing notes reviewed. Weight trend reviewed. Physical Activity: Sufficiently Active (01/07/2024)   Exercise Vital Sign    Days of Exercise per Week: 3 days    Minutes of Exercise per Session: 90 min   General Appearance:  No acute distress appreciable.   Well-groomed, healthy-appearing male.  Well proportioned with no abnormal fat distribution.  Good muscle tone. Pulmonary:  Normal work of breathing at rest, no respiratory distress apparent. SpO2: 96 %  Musculoskeletal: All extremities are intact.  Neurological:  Awake, alert, oriented, and engaged.  No obvious focal neurological deficits or cognitive impairments.  Sensorium seems unclouded.   Speech is clear and coherent with logical content. Psychiatric:  Appropriate mood, pleasant and cooperative demeanor, thoughtful and engaged during the exam   Verbalized to patient: Physical Exam MEASUREMENTS: Weight- 237. GENITOURINARY: Slight redness along the right inguinal ligament, minimal, no fungal rash. EXTREMITIES: Severe chronic venous insufficiency in both legs, numerous tiny infected skin scabs, large bruises on the back of each calf.   Results:   Verbalized to patient: Results      01/07/2024   11:33 AM 08/30/2023    8:26 AM 03/27/2023    8:26 AM 01/10/2023   10:56 AM  PHQ 2/9 Scores  PHQ - 2 Score 0 0 0 3  PHQ- 9 Score 1 1 0 6   Clinical Support on 12/17/2023  Component Date Value Ref Range Status   Date Time Interrogation Session 12/17/2023 [id number]   Final   Pulse Generator Manufacturer 12/17/2023 SJCR   Final   Pulse Gen Model 12/17/2023 [id number] Gallant HF   Final   Pulse Gen Serial Number 12/17/2023 [id number]    Final   Clinic Name 12/17/2023 [facility]   Final   Implantable Pulse Generator Type 12/17/2023 Cardiac Resynch Therapy Defibulator   Final   Implantable Pulse Generator Implan* 12/17/2023 [id number]   Final   Implantable Lead Manufacturer 12/17/2023 [facility]   Final   Implantable Lead Model 12/17/2023 1456Q Quartet   Final   Implantable Lead  Serial Number 12/17/2023 [id number]   Final   Implantable Lead Implant Date 12/17/2023 [id number]   Final   Implantable Lead Location Detail 1 12/17/2023 UNKNOWN   Final   Implantable Lead Location 12/17/2023 246141   Final   Implantable Lead Connection Status 12/17/2023 246014   Final   Implantable Lead Manufacturer 12/17/2023 [facility]   Final   Implantable Lead Model 12/17/2023 7122Q Durata SJ4   Final   Implantable Lead Serial Number 12/17/2023 [id number]   Final   Implantable Lead Implant Date 12/17/2023 [id number]   Final   Implantable Lead Location Detail 1 12/17/2023 UNKNOWN   Final   Implantable Lead Location 12/17/2023 246139   Final   Implantable Lead Connection Status 12/17/2023 246014   Final   Implantable Lead Manufacturer 12/17/2023 OTHER   Final   Implantable Lead Model 12/17/2023 [id number]/41 ULTIPACE   Final   Implantable Lead Serial Number 12/17/2023 [id number]   Final   Implantable Lead Implant Date 12/17/2023 [id number]   Final   Implantable Lead Location Detail 1 12/17/2023 UNKNOWN   Final   Implantable Lead Location 12/17/2023 246140   Final   Implantable Lead Connection Status 12/17/2023 246014   Final   Lead Channel Setting Sensing Sensi* 12/17/2023 0.5  mV Final   Lead Channel Setting Sensing Adapt* 12/17/2023 Adaptive Sensing   Final   Lead Channel Setting Pacing Amplit* 12/17/2023 2.0  V Final   Lead Channel Setting Pacing Pulse * 12/17/2023 0.5  ms Final   Lead Channel Setting Pacing Amplit* 12/17/2023 1.5  V Final   Lead Channel Setting Pacing Pulse * 12/17/2023 0.5  ms Final   Lead Channel Setting Pacing Amplit* 12/17/2023 2.5  V  Final   Lead Channel Setting Pacing Captur* 12/17/2023 Adaptive Capture   Final   Zone Setting Status 12/17/2023 Active   Final   Zone Setting Status 12/17/2023 Inactive   Final   Zone Setting Status 12/17/2023 755011   Final   Lead Channel Status 12/17/2023 NULL   Final   Lead Channel Impedance Value 12/17/2023 890  ohm Final   Lead Channel Pacing Threshold Ampl* 12/17/2023 2.0  V Final   Lead Channel Pacing Threshold Puls* 12/17/2023 0.5  ms Final   Lead Channel Status 12/17/2023 NULL   Final   Lead Channel Impedance Value 12/17/2023 490  ohm Final   Lead Channel Sensing Intrinsic Amp* 12/17/2023 4.3  mV Final   Lead Channel Pacing Threshold Ampl* 12/17/2023 0.75  V Final   Lead Channel Pacing Threshold Puls* 12/17/2023 0.5  ms Final   Lead Channel Status 12/17/2023 NULL   Final   Lead Channel Impedance Value 12/17/2023 400  ohm Final   Lead Channel Sensing Intrinsic Amp* 12/17/2023 5.5  mV Final   Lead Channel Pacing Threshold Ampl* 12/17/2023 0.5  V Final   Lead Channel Pacing Threshold Puls* 12/17/2023 0.5  ms Final   HighPow Impedance 12/17/2023 72  ohm Final   HighPow Imped Status 12/17/2023 NULL   Final   Battery Status 12/17/2023 MOS   Final   Battery Remaining Longevity 12/17/2023 74  mo Final   Battery Remaining Percentage 12/17/2023 85.0  % Final   Battery Voltage 12/17/2023 2.96  V Final   Brady Statistic RA Percent Paced 12/17/2023 99.0  % Final   Brady Statistic AP VP Percent 12/17/2023 97.0  % Final   Brady Statistic AS VP Percent 12/17/2023 1.0  % Final   Brady Statistic AP VS Percent 12/17/2023 2.5  % Final  Brady Statistic AS VS Percent 12/17/2023 1.0  % Final  Hospital Outpatient Visit on 11/07/2023  Component Date Value Ref Range Status   S' Lateral 11/07/2023 3.92  cm Final   Est EF 11/07/2023 40 - 45%   Final  Clinical Support on 09/17/2023  Component Date Value Ref Range Status   Date Time Interrogation Session 09/16/2023 [id number]   Final   Pulse  Generator Manufacturer 09/16/2023 SJCR   Final   Pulse Gen Model 09/16/2023 [id number] Gallant HF   Final   Pulse Gen Serial Number 09/16/2023 [id number]   Final   Clinic Name 09/16/2023 [facility]   Final   Implantable Pulse Generator Type 09/16/2023 Cardiac Resynch Therapy Defibulator   Final   Implantable Pulse Generator Implan* 09/16/2023 [id number]   Final   [facility]   Final   Implantable Lead Model 09/16/2023 1456Q Quartet   Final   Implantable Lead Serial Number 09/16/2023 [id number]   Final   Implantable Lead Implant Date 09/16/2023 [id number]   Final   Implantable Lead Location Detail 1 09/16/2023 UNKNOWN   Final   Implantable Lead Location 09/16/2023 246141   Final   Implantable Lead Connection Status 09/16/2023 246014   Final   Implantable Lead Manufacturer 09/16/2023 [facility]   Final   Implantable Lead Model 09/16/2023 7122Q Durata SJ4   Final   Implantable Lead Serial Number 09/16/2023 [id number]   Final   Implantable Lead Implant Date 09/16/2023 [id number]   Final   Implantable Lead Location Detail 1 09/16/2023 UNKNOWN   Final   Implantable Lead Location 09/16/2023 246139   Final   Implantable Lead Connection Status 09/16/2023 246014   Final   Implantable Lead Manufacturer 09/16/2023 OTHER   Final   Implantable Lead Model 09/16/2023 [id number]/41 ULTIPACE   Final   Implantable Lead Serial Number 09/16/2023 [id number]   Final   Implantable Lead Implant Date 09/16/2023 [id number]   Final   Implantable Lead Location Detail 1 09/16/2023 UNKNOWN   Final   Implantable Lead Location 09/16/2023 246140   Final   Implantable Lead Connection Status 09/16/2023 246014   Final   Lead Channel Setting Sensing Sensi* 09/16/2023 0.5  mV Final   Lead Channel Setting Sensing Adapt* 09/16/2023 Adaptive Sensing   Final   Lead Channel Setting Pacing Amplit* 09/16/2023 2.0  V Final   Lead Channel Setting Pacing Pulse * 09/16/2023 0.5  ms Final   Lead Channel Setting Pacing  Amplit* 09/16/2023 1.5  V Final   Lead Channel Setting Pacing Pulse * 09/16/2023 0.5  ms Final   Lead Channel Setting Pacing Amplit* 09/16/2023 2.25  V Final   Lead Channel Setting Pacing Captur* 09/16/2023 Adaptive Capture   Final   Zone Setting Status 09/16/2023 Active   Final   Zone Setting Status 09/16/2023 Inactive   Final   Zone Setting Status 09/16/2023 244988   Final   Lead Channel Status 09/16/2023 NULL   Final   Lead Channel Impedance Value 09/16/2023 910  ohm Final   Lead Channel Pacing Threshold Ampl* 09/16/2023 1.75  V Final   Lead Channel Pacing Threshold Puls* 09/16/2023 0.5  ms Final   Lead Channel Status 09/16/2023 NULL   Final   Lead Channel Impedance Value 09/16/2023 490  ohm Final   Lead Channel Sensing Intrinsic Amp* 09/16/2023 4.3  mV Final   Lead Channel Pacing Threshold Ampl* 09/16/2023 0.75  V Final   Lead Channel Pacing Threshold Puls* 09/16/2023 0.5  ms Final   Lead  Channel Status 09/16/2023 NULL   Final   Lead Channel Impedance Value 09/16/2023 410  ohm Final   Lead Channel Sensing Intrinsic Amp* 09/16/2023 11.4  mV Final   Lead Channel Pacing Threshold Ampl* 09/16/2023 0.5  V Final   Lead Channel Pacing Threshold Puls* 09/16/2023 0.5  ms Final   HighPow Impedance 09/16/2023 78  ohm Final   HighPow Imped Status 09/16/2023 NULL   Final   Battery Status 09/16/2023 MOS   Final   Battery Remaining Longevity 09/16/2023 77  mo Final   Battery Remaining Percentage 09/16/2023 89.0  % Final   Battery Voltage 09/16/2023 2.96  V Final   Brady Statistic RA Percent Paced 09/16/2023 99.0  % Final   Brady Statistic AP VP Percent 09/16/2023 97.0  % Final   Brady Statistic AS VP Percent 09/16/2023 1.0  % Final   Brady Statistic AP VS Percent 09/16/2023 2.8  % Final   Brady Statistic AS VS Percent 09/16/2023 1.0  % Final  Office Visit on 08/30/2023  Component Date Value Ref Range Status   Immunoglobulin A 08/30/2023 150  70 - 320 mg/dL Final   IgG (Immunoglobin G), Serum  08/30/2023 520 (L)  600 - 1,540 mg/dL Final   IgM, Serum 94/98/7974 47 (L)  50 - 300 mg/dL Final   WBC 94/98/7974 5.7  4.0 - 10.5 K/uL Final   RBC 08/30/2023 4.57  4.22 - 5.81 Mil/uL Final   Hemoglobin 08/30/2023 15.5  13.0 - 17.0 g/dL Final   HCT 94/98/7974 46.7  39.0 - 52.0 % Final   MCV 08/30/2023 102.1 (H)  78.0 - 100.0 fl Final   MCHC 08/30/2023 33.2  30.0 - 36.0 g/dL Final   RDW 94/98/7974 14.2  11.5 - 15.5 % Final   Platelets 08/30/2023 190.0  150.0 - 400.0 K/uL Final   Neutrophils Relative % 08/30/2023 59.6  43.0 - 77.0 % Final   Lymphocytes Relative 08/30/2023 31.4  12.0 - 46.0 % Final   Monocytes Relative 08/30/2023 7.1  3.0 - 12.0 % Final   Eosinophils Relative 08/30/2023 1.3  0.0 - 5.0 % Final   Basophils Relative 08/30/2023 0.6  0.0 - 3.0 % Final   Neutro Abs 08/30/2023 3.4  1.4 - 7.7 K/uL Final   Lymphs Abs 08/30/2023 1.8  0.7 - 4.0 K/uL Final   Monocytes Absolute 08/30/2023 0.4  0.1 - 1.0 K/uL Final   Eosinophils Absolute 08/30/2023 0.1  0.0 - 0.7 K/uL Final   Basophils Absolute 08/30/2023 0.0  0.0 - 0.1 K/uL Final   Sodium 08/30/2023 141  135 - 145 mEq/L Final   Potassium 08/30/2023 4.1  3.5 - 5.1 mEq/L Final   Chloride 08/30/2023 103  96 - 112 mEq/L Final   CO2 08/30/2023 32  19 - 32 mEq/L Final   Glucose, Bld 08/30/2023 79  70 - 99 mg/dL Final   BUN 94/98/7974 13  6 - 23 mg/dL Final   Creatinine, Ser 08/30/2023 0.99  0.40 - 1.50 mg/dL Final   Total Bilirubin 08/30/2023 0.6  0.2 - 1.2 mg/dL Final   Alkaline Phosphatase 08/30/2023 57  39 - 117 U/L Final   AST 08/30/2023 25  0 - 37 U/L Final   ALT 08/30/2023 21  0 - 53 U/L Final   Total Protein 08/30/2023 6.3  6.0 - 8.3 g/dL Final   Albumin  08/30/2023 4.1  3.5 - 5.2 g/dL Final   GFR 94/98/7974 75.32  >60.00 mL/min Final   Calcium  08/30/2023 9.4  8.4 - 10.5  mg/dL Final   Cholesterol 94/98/7974 86  0 - 200 mg/dL Final   Triglycerides 94/98/7974 87.0  0.0 - 149.0 mg/dL Final   HDL 94/98/7974 54.60  >39.00 mg/dL Final    VLDL 94/98/7974 17.4  0.0 - 40.0 mg/dL Final   LDL Cholesterol 08/30/2023 14  0 - 99 mg/dL Final   Total CHOL/HDL Ratio 08/30/2023 2   Final   NonHDL 08/30/2023 31.19   Final   Hgb A1c MFr Bld 08/30/2023 5.3  4.6 - 6.5 % Final   VITD 08/30/2023 45.44  30.00 - 100.00 ng/mL Final   Uric Acid, Serum 08/30/2023 3.2 (L)  4.0 - 7.8 mg/dL Final  Office Visit on 07/17/2023  Component Date Value Ref Range Status   Date Time Interrogation Session 07/17/2023 [id number]   Final   Pulse Generator Manufacturer 07/17/2023 SJCR   Final   Pulse Gen Model 07/17/2023 [id number] Gallant HF   Final   Pulse Gen Serial Number 07/17/2023 [id number]   Final   Clinic Name 07/17/2023 [facility]   Final   Implantable Pulse Generator Type 07/17/2023 Cardiac Resynch Therapy Defibulator   Final   Implantable Pulse Generator Implan* 07/17/2023 [id number]   Final   Implantable Lead Manufacturer 07/17/2023 [facility]   Final   Implantable Lead Model 07/17/2023 1456Q Quartet   Final   Implantable Lead Serial Number 07/17/2023 [id number]   Final   Implantable Lead Implant Date 07/17/2023 [id number]   Final   Implantable Lead Location Detail 1 07/17/2023 UNKNOWN   Final   Implantable Lead Location 07/17/2023 246141   Final   Implantable Lead Connection Status 07/17/2023 246014   Final   Implantable Lead Manufacturer 07/17/2023 [facility]   Final   Implantable Lead Model 07/17/2023 7122Q Durata SJ4   Final   Implantable Lead Serial Number 07/17/2023 [id number]   Final   Implantable Lead Implant Date 07/17/2023 [id number]   Final   Implantable Lead Location Detail 1 07/17/2023 UNKNOWN   Final   Implantable Lead Location 07/17/2023 246139   Final   Implantable Lead Connection Status 07/17/2023 246014   Final   Implantable Lead Manufacturer 07/17/2023 OTHER   Final   Implantable Lead Model 07/17/2023 [id number]/41 ULTIPACE   Final   Implantable Lead Serial Number 07/17/2023 [id number]   Final   Implantable Lead Implant Date 07/17/2023  [id number]   Final   Implantable Lead Location Detail 1 07/17/2023 UNKNOWN   Final   Implantable Lead Location 07/17/2023 246140   Final   Implantable Lead Connection Status 07/17/2023 246014   Final   Lead Channel Setting Sensing Sensi* 07/17/2023 0.5  mV Final   Lead Channel Setting Pacing Amplit* 07/17/2023 2.0  V Final   Lead Channel Setting Pacing Pulse * 07/17/2023 0.5  ms Final   Lead Channel Setting Pacing Amplit* 07/17/2023 1.375   Final   Lead Channel Setting Pacing Pulse * 07/17/2023 0.5  ms Final   Lead Channel Setting Pacing Amplit* 07/17/2023 2.25  V Final   Lead Channel Setting Pacing Captur* 07/17/2023 Adaptive Capture   Final   Zone Setting Status 07/17/2023 Active   Final   Zone Setting Status 07/17/2023 Inactive   Final   Zone Setting Status 07/17/2023 244988   Final   Lead Channel Impedance Value 07/17/2023 525.0  ohm Final   Lead Channel Sensing Intrinsic Amp* 07/17/2023 4.0  mV Final   Lead Channel Pacing Threshold Ampl* 07/17/2023 0.75  V Final   Lead Channel Pacing Threshold Puls* 07/17/2023 0.5  ms Final  Lead Channel Pacing Threshold Ampl* 07/17/2023 0.75  V Final   Lead Channel Pacing Threshold Puls* 07/17/2023 0.5  ms Final   Lead Channel Impedance Value 07/17/2023 412.5  ohm Final   Lead Channel Sensing Intrinsic Amp* 07/17/2023 5.5  mV Final   Lead Channel Pacing Threshold Ampl* 07/17/2023 0.375  V Final   Lead Channel Pacing Threshold Puls* 07/17/2023 0.5  ms Final   HighPow Impedance 07/17/2023 72.0  ohm Final   Lead Channel Impedance Value 07/17/2023 887.5  ohm Final   Lead Channel Pacing Threshold Ampl* 07/17/2023 1.75  V Final   Lead Channel Pacing Threshold Puls* 07/17/2023 0.5  ms Final   Lead Channel Pacing Threshold Ampl* 07/17/2023 1.75  V Final   Lead Channel Pacing Threshold Puls* 07/17/2023 0.5  ms Final   Battery Remaining Longevity 07/17/2023 78  mo Final   [facility] Statistic RA Percent Paced 07/17/2023 99.0  % Final   Brady Statistic RV  Percent Paced 07/17/2023 97.0  % Final  Office Visit on 06/26/2023  Component Date Value Ref Range Status   Total Protein 06/26/2023 6.4  6.0 - 8.5 g/dL Final   Albumin  06/26/2023 4.3  3.8 - 4.8 g/dL Final   Bilirubin Total 06/26/2023 0.8  0.0 - 1.2 mg/dL Final   Bilirubin, Direct 06/26/2023 0.34  0.00 - 0.40 mg/dL Final   Alkaline Phosphatase 06/26/2023 66  44 - 121 IU/L Final   AST 06/26/2023 30  0 - 40 IU/L Final   ALT 06/26/2023 23  0 - 44 IU/L Final   TSH 06/26/2023 2.580  0.450 - 4.500 uIU/mL Final   Date Time Interrogation Session 06/26/2023 [id number]   Final   Pulse Generator Manufacturer 06/26/2023 [facility]   Final   Pulse Gen Model 06/26/2023 [id number] Gallant HF   Final   Pulse Gen Serial Number 06/26/2023 [id number]   Final   Clinic Name 06/26/2023 [facility]   Final   Implantable Pulse Generator Type 06/26/2023 Cardiac Resynch Therapy Defibulator   Final   Implantable Pulse Generator Implan* 06/26/2023 [id number]   Final   Implantable Lead Manufacturer 06/26/2023 [facility]   Final   Implantable Lead Model 06/26/2023 1456Q Quartet   Final   Implantable Lead Serial Number 06/26/2023 [id number]   Final   Implantable Lead Implant Date 06/26/2023 [id number]   Final   Implantable Lead Location Detail 1 06/26/2023 UNKNOWN   Final   Implantable Lead Location 06/26/2023 246141   Final   Implantable Lead Connection Status 06/26/2023 246014   Final   Implantable Lead Manufacturer 06/26/2023 [facility]   Final   Implantable Lead Model 06/26/2023 7122Q Durata SJ4   Final   Implantable Lead Serial Number 06/26/2023 [id number]   Final   Implantable Lead Implant Date 06/26/2023 [id number]   Final   Implantable Lead Location Detail 1 06/26/2023 UNKNOWN   Final   Implantable Lead Location 06/26/2023 246139   Final   Implantable Lead Connection Status 06/26/2023 246014   Final   Implantable Lead Manufacturer 06/26/2023 OTHER   Final   Implantable Lead Model 06/26/2023 [id number]/41 ULTIPACE   Final    Implantable Lead Serial Number 06/26/2023 [id number]   Final   Implantable Lead Implant Date 06/26/2023 [id number]   Final   Implantable Lead Location Detail 1 06/26/2023 UNKNOWN   Final   Implantable Lead Location 06/26/2023 246140   Final   Implantable Lead Connection Status 06/26/2023 246014   Final   Lead Channel Setting Sensing Sensi* 06/26/2023 0.5  mV Final   Lead  Channel Setting Pacing Amplit* 06/26/2023 2.5  V Final   Lead Channel Setting Pacing Pulse * 06/26/2023 0.5  ms Final   Lead Channel Setting Pacing Amplit* 06/26/2023 1.5  V Final   Lead Channel Setting Pacing Pulse * 06/26/2023 0.5  ms Final   Lead Channel Setting Pacing Amplit* 06/26/2023 2.0  V Final   Lead Channel Setting Pacing Captur* 06/26/2023 Fixed Pacing   Final   Zone Setting Status 06/26/2023 Active   Final   Zone Setting Status 06/26/2023 Inactive   Final   Zone Setting Status 06/26/2023 244988   Final   Lead Channel Sensing Intrinsic Amp* 06/26/2023 3.6  mV Final   Lead Channel Sensing Intrinsic Amp* 06/26/2023 10.0  mV Final   Lead Channel Pacing Threshold Ampl* 06/26/2023 0.5  V Final   Lead Channel Pacing Threshold Puls* 06/26/2023 0.5  ms Final   Lead Channel Impedance Value 06/26/2023 787.5  ohm Final   Lead Channel Pacing Threshold Ampl* 06/26/2023 1.5  V Final   Lead Channel Pacing Threshold Puls* 06/26/2023 0.5  ms Final   Lead Channel Pacing Threshold Ampl* 06/26/2023 1.5  V Final   Lead Channel Pacing Threshold Puls* 06/26/2023 0.5  ms Final   Battery Remaining Longevity 06/26/2023 80  mo Final   [facility] Statistic RA Percent Paced 06/26/2023 95.0  % Final   Brady Statistic RV Percent Paced 06/26/2023 97.0  % Final  Clinical Support on 06/18/2023  Component Date Value Ref Range Status   Date Time Interrogation Session 06/19/2023 [id number]   Final   Pulse Generator Manufacturer 06/19/2023 [facility]   Final   Pulse Gen Model 06/19/2023 [id number] Gallant HF   Final   Pulse Gen Serial Number 06/19/2023  [id number]   Final   Clinic Name 06/19/2023 [facility]   Final   Implantable Pulse Generator Type 06/19/2023 Cardiac Resynch Therapy Defibulator   Final   Implantable Pulse Generator Implan* 06/19/2023 [id number]   Final   Implantable Lead Manufacturer 06/19/2023 [facility]   Final   Implantable Lead Model 06/19/2023 1456Q Quartet   Final   Implantable Lead Serial Number 06/19/2023 [id number]   Final   Implantable Lead Implant Date 06/19/2023 [id number]   Final   Implantable Lead Location Detail 1 06/19/2023 UNKNOWN   Final   Implantable Lead Location 06/19/2023 246141   Final   Implantable Lead Connection Status 06/19/2023 246014   Final   Implantable Lead Manufacturer 06/19/2023 [facility]   Final   Implantable Lead Model 06/19/2023 7122Q Durata SJ4   Final   Implantable Lead Serial Number 06/19/2023 [id number]   Final   Implantable Lead Implant Date 06/19/2023 [id number]   Final   Implantable Lead Location Detail 1 06/19/2023 UNKNOWN   Final   Implantable Lead Location 06/19/2023 246139   Final   Implantable Lead Connection Status 06/19/2023 246014   Final   Implantable Lead Manufacturer 06/19/2023 OTHER   Final   Implantable Lead Model 06/19/2023 OEJ8768/41 ULTIPACE   Final   Implantable Lead Serial Number 06/19/2023 [id number]   Final   Implantable Lead Implant Date 06/19/2023 [id number]   Final   Implantable Lead Location Detail 1 06/19/2023 UNKNOWN   Final   Implantable Lead Location 06/19/2023 246140   Final   Implantable Lead Connection Status 06/19/2023 246014   Final   Lead Channel Setting Sensing Sensi* 06/19/2023 0.5  mV Final   Lead Channel Setting Sensing Adapt* 06/19/2023 Adaptive Sensing   Final   Lead Channel Setting Pacing Amplit* 06/19/2023 2.5  V  Final   Lead Channel Setting Pacing Pulse * 06/19/2023 0.5  ms Final   Lead Channel Setting Pacing Amplit* 06/19/2023 1.5  V Final   Lead Channel Setting Pacing Pulse * 06/19/2023 0.5  ms Final   Lead Channel Setting Pacing Amplit* 06/19/2023  1.5  V Final   Lead Channel Setting Pacing Captur* 06/19/2023 Fixed Pacing   Final   Zone Setting Status 06/19/2023 Active   Final   Zone Setting Status 06/19/2023 Inactive   Final   Zone Setting Status 06/19/2023 755011   Final   Lead Channel Status 06/19/2023 NULL   Final   Lead Channel Impedance Value 06/19/2023 910  ohm Final   Lead Channel Pacing Threshold Ampl* 06/19/2023 1.0  V Final   Lead Channel Pacing Threshold Puls* 06/19/2023 1.0  ms Final   Lead Channel Status 06/19/2023 NULL   Final   Lead Channel Impedance Value 06/19/2023 540  ohm Final   Lead Channel Sensing Intrinsic Amp* 06/19/2023 4.2  mV Final   Lead Channel Pacing Threshold Ampl* 06/19/2023 0.5  V Final   Lead Channel Pacing Threshold Puls* 06/19/2023 0.5  ms Final   Lead Channel Status 06/19/2023 NULL   Final   Lead Channel Impedance Value 06/19/2023 450  ohm Final   Lead Channel Sensing Intrinsic Amp* 06/19/2023 12.0  mV Final   Lead Channel Pacing Threshold Ampl* 06/19/2023 0.5  V Final   Lead Channel Pacing Threshold Puls* 06/19/2023 0.5  ms Final   HighPow Impedance 06/19/2023 74  ohm Final   HighPow Imped Status 06/19/2023 NULL   Final   Battery Status 06/19/2023 MOS   Final   Battery Remaining Longevity 06/19/2023 85  mo Final   Battery Remaining Percentage 06/19/2023 92.0  % Final   Battery Voltage 06/19/2023 2.96  V Final   Brady Statistic RA Percent Paced 06/19/2023 94.0  % Final   Brady Statistic AP VP Percent 06/19/2023 95.0  % Final   Brady Statistic AS VP Percent 06/19/2023 1.0  % Final   Brady Statistic AP VS Percent 06/19/2023 2.3  % Final   Brady Statistic AS VS Percent 06/19/2023 1.0  % Final  Office Visit on 05/03/2023  Component Date Value Ref Range Status   Glucose 05/03/2023 77  70 - 99 mg/dL Final   BUN 98/97/7974 12  8 - 27 mg/dL Final   Creatinine, Ser 05/03/2023 0.97  0.76 - 1.27 mg/dL Final   eGFR 98/97/7974 82  >59 mL/min/1.73 Final   BUN/Creatinine Ratio 05/03/2023 12  10 - 24  Final   Sodium 05/03/2023 144  134 - 144 mmol/L Final   Potassium 05/03/2023 4.4  3.5 - 5.2 mmol/L Final   Chloride 05/03/2023 104  96 - 106 mmol/L Final   CO2 05/03/2023 25  20 - 29 mmol/L Final   Calcium  05/03/2023 10.1  8.6 - 10.2 mg/dL Final   WBC 98/97/7974 6.9  3.4 - 10.8 x10E3/uL Final   RBC 05/03/2023 4.63  4.14 - 5.80 x10E6/uL Final   Hemoglobin 05/03/2023 15.6  13.0 - 17.7 g/dL Final   Hematocrit 98/97/7974 46.8  37.5 - 51.0 % Final   MCV 05/03/2023 101 (H)  79 - 97 fL Final   MCH 05/03/2023 33.7 (H)  26.6 - 33.0 pg Final   MCHC 05/03/2023 33.3  31.5 - 35.7 g/dL Final   RDW 98/97/7974 13.0  11.6 - 15.4 % Final   Platelets 05/03/2023 170  150 - 450 x10E3/uL Final   Neutrophils 05/03/2023 64  Not Estab. % Final   Lymphs 05/03/2023 26  Not Estab. % Final   Monocytes 05/03/2023 8  Not Estab. % Final   Eos 05/03/2023 1  Not Estab. % Final   Basos 05/03/2023 1  Not Estab. % Final   Neutrophils Absolute 05/03/2023 4.4  1.4 - 7.0 x10E3/uL Final   Lymphocytes Absolute 05/03/2023 1.8  0.7 - 3.1 x10E3/uL Final   Monocytes Absolute 05/03/2023 0.5  0.1 - 0.9 x10E3/uL Final   EOS (ABSOLUTE) 05/03/2023 0.1  0.0 - 0.4 x10E3/uL Final   Basophils Absolute 05/03/2023 0.0  0.0 - 0.2 x10E3/uL Final   Immature Granulocytes 05/03/2023 0  Not Estab. % Final   Immature Grans (Abs) 05/03/2023 0.0  0.0 - 0.1 x10E3/uL Final  Clinical Support on 04/04/2023  Component Date Value Ref Range Status   Date Time Interrogation Session 04/04/2023 [id number]   Final   Pulse Generator Manufacturer 04/04/2023 [facility]   Final   Pulse Gen Model 04/04/2023 [id number] Gallant HF   Final   Pulse Gen Serial Number 04/04/2023 [id number]   Final   Clinic Name 04/04/2023 [facility]   Final   Implantable Pulse Generator Type 04/04/2023 Cardiac Resynch Therapy Defibulator   Final   Implantable Pulse Generator Implan* 04/04/2023 [id number]   Final   Implantable Lead Manufacturer 04/04/2023 [facility]   Final   Implantable  Lead Model 04/04/2023 1456Q Quartet   Final   Implantable Lead Serial Number 04/04/2023 [id number]   Final   Implantable Lead Implant Date 04/04/2023 [id number]   Final   Implantable Lead Location Detail 1 04/04/2023 UNKNOWN   Final   Implantable Lead Location 04/04/2023 246141   Final   Implantable Lead Connection Status 04/04/2023 246014   Final   Implantable Lead Manufacturer 04/04/2023 [facility]   Final   Implantable Lead Model 04/04/2023 7122Q Durata SJ4   Final   Implantable Lead Serial Number 04/04/2023 [id number]   Final   Implantable Lead Implant Date 04/04/2023 [id number]   Final   Implantable Lead Location Detail 1 04/04/2023 UNKNOWN   Final   Implantable Lead Location 04/04/2023 246139   Final   Implantable Lead Connection Status 04/04/2023 246014   Final   Implantable Lead Manufacturer 04/04/2023 OTHER   Final   Implantable Lead Model 04/04/2023 [id number]/41 ULTIPACE   Final   Implantable Lead Serial Number 04/04/2023 [id number]   Final   Implantable Lead Implant Date 04/04/2023 [id number]   Final   Implantable Lead Location Detail 1 04/04/2023 UNKNOWN   Final   Implantable Lead Location 04/04/2023 246140   Final   Implantable Lead Connection Status 04/04/2023 246014   Final   Lead Channel Setting Sensing Sensi* 04/04/2023 0.5  mV Final   Lead Channel Setting Pacing Pulse * 04/04/2023 0.5  ms Final   Lead Channel Setting Pacing Amplit* 04/04/2023 3.5  V Final   Lead Channel Setting Pacing Pulse * 04/04/2023 0.5  ms Final   Lead Channel Setting Pacing Amplit* 04/04/2023 3.5  V Final   Lead Channel Setting Pacing Captur* 04/04/2023 Fixed Pacing   Final   Zone Setting Status 04/04/2023 Active   Final   Zone Setting Status 04/04/2023 Inactive   Final   Zone Setting Status 04/04/2023 244988   Final   Lead Channel Impedance Value 04/04/2023 525.0  ohm Final   Lead Channel Impedance Value 04/04/2023 450.0  ohm Final   Lead Channel Sensing Intrinsic Amp* 04/04/2023 4.2  mV Final   Lead Channel  Pacing Threshold Ampl* 04/04/2023 0.5  V  Final   Lead Channel Pacing Threshold Puls* 04/04/2023 0.5  ms Final   Lead Channel Pacing Threshold Ampl* 04/04/2023 0.5  V Final   Lead Channel Pacing Threshold Puls* 04/04/2023 0.5  ms Final   HighPow Impedance 04/04/2023 64.125   Final   Lead Channel Impedance Value 04/04/2023 725.0  ohm Final   Lead Channel Pacing Threshold Ampl* 04/04/2023 1.0  V Final   Lead Channel Pacing Threshold Puls* 04/04/2023 0.5  ms Final   Lead Channel Pacing Threshold Ampl* 04/04/2023 1.0  V Final   Lead Channel Pacing Threshold Puls* 04/04/2023 0.5  ms Final   Battery Remaining Longevity 04/04/2023 75  mo Final   [facility] Statistic RA Percent Paced 04/04/2023 0  % Final   Brady Statistic RV Percent Paced 04/04/2023 53.0  % Final  Office Visit on 03/27/2023  Component Date Value Ref Range Status   PTH 03/27/2023 31  16 - 77 pg/mL Final   Calcium  03/27/2023 9.7  8.6 - 10.3 mg/dL Final   Calcium , Ion 03/27/2023 5.3  4.7 - 5.5 mg/dL Final   WBC 88/73/7975 6.3  4.0 - 10.5 K/uL Final   RBC 03/27/2023 4.48  4.22 - 5.81 Mil/uL Final   Hemoglobin 03/27/2023 15.0  13.0 - 17.0 g/dL Final   HCT 88/73/7975 45.7  39.0 - 52.0 % Final   MCV 03/27/2023 102.0 (H)  78.0 - 100.0 fl Final   MCHC 03/27/2023 32.9  30.0 - 36.0 g/dL Final   RDW 88/73/7975 15.2  11.5 - 15.5 % Final   Platelets 03/27/2023 215.0  150.0 - 400.0 K/uL Final   Neutrophils Relative % 03/27/2023 57.8  43.0 - 77.0 % Final   Lymphocytes Relative 03/27/2023 30.2  12.0 - 46.0 % Final   Monocytes Relative 03/27/2023 7.9  3.0 - 12.0 % Final   Eosinophils Relative 03/27/2023 3.1  0.0 - 5.0 % Final   Basophils Relative 03/27/2023 1.0  0.0 - 3.0 % Final   Neutro Abs 03/27/2023 3.7  1.4 - 7.7 K/uL Final   Lymphs Abs 03/27/2023 1.9  0.7 - 4.0 K/uL Final   Monocytes Absolute 03/27/2023 0.5  0.1 - 1.0 K/uL Final   Eosinophils Absolute 03/27/2023 0.2  0.0 - 0.7 K/uL Final   Basophils Absolute 03/27/2023 0.1  0.0 - 0.1 K/uL  Final   Sodium 03/27/2023 141  135 - 145 mEq/L Final   Potassium 03/27/2023 4.3  3.5 - 5.1 mEq/L Final   Chloride 03/27/2023 104  96 - 112 mEq/L Final   CO2 03/27/2023 32  19 - 32 mEq/L Final   Glucose, Bld 03/27/2023 77  70 - 99 mg/dL Final   BUN 88/73/7975 12  6 - 23 mg/dL Final   Creatinine, Ser 03/27/2023 0.76  0.40 - 1.50 mg/dL Final   Total Bilirubin 03/27/2023 0.5  0.2 - 1.2 mg/dL Final   Alkaline Phosphatase 03/27/2023 63  39 - 117 U/L Final   AST 03/27/2023 21  0 - 37 U/L Final   ALT 03/27/2023 15  0 - 53 U/L Final   Total Protein 03/27/2023 6.2  6.0 - 8.3 g/dL Final   Albumin  03/27/2023 3.9  3.5 - 5.2 g/dL Final   GFR 88/73/7975 89.14  >60.00 mL/min Final   Calcium  03/27/2023 9.6  8.4 - 10.5 mg/dL Final   VITD 88/73/7975 45.99  30.00 - 100.00 ng/mL Final   TSH 03/27/2023 0.967  0.450 - 4.500 uIU/mL Final   Magnesium 03/27/2023 1.8  1.5 - 2.5 mg/dL Final   Osmolality,  Ur 03/27/2023 810  50 - 1,200 mOsm/kg Final   Osmolality 03/27/2023 296  278 - 305 mOsm/kg Final   Creatinine, Urine 03/27/2023 185  20 - 320 mg/dL Final   Sodium, Ur 88/73/7975 162  28 - 272 mmol/L Final   Vitamin B-12 03/27/2023 401  211 - 911 pg/mL Final   Folate 03/27/2023 >24.2  >5.9 ng/mL Final   Path Review 03/27/2023    Final   Total Protein 03/27/2023 5.8 (L)  6.1 - 8.1 g/dL Final   Albumin  ELP 03/27/2023 3.5 (L)  3.8 - 4.8 g/dL Final   Alpha 1 88/73/7975 0.3  0.2 - 0.3 g/dL Final   Alpha 2 88/73/7975 0.8  0.5 - 0.9 g/dL Final   Beta Globulin 88/73/7975 0.4  0.4 - 0.6 g/dL Final   Beta 2 88/73/7975 0.3  0.2 - 0.5 g/dL Final   Gamma Globulin 03/27/2023 0.5 (L)  0.8 - 1.7 g/dL Final   Abnormal Protein Band1 03/27/2023   NONE DETECTED g/dL Final   SPE Interp. 03/27/2023    Final   Cholesterol 03/27/2023 115  0 - 200 mg/dL Final   Triglycerides 88/73/7975 90.0  0.0 - 149.0 mg/dL Final   HDL 88/73/7975 49.30  >39.00 mg/dL Final   VLDL 88/73/7975 18.0  0.0 - 40.0 mg/dL Final   LDL Cholesterol  03/27/2023 48  0 - 99 mg/dL Final   Total CHOL/HDL Ratio 03/27/2023 2   Final   NonHDL 03/27/2023 65.89   Final  There may be more visits with results that are not included.  No image results found. CUP PACEART REMOTE DEVICE CHECK Result Date: 12/19/2023 BIV ICD scheduled remote reviewed. Normal device function.  Presenting rhythm:AP-BVP Next remote 91 days. AB, CVRS  ECHOCARDIOGRAM COMPLETE Result Date: 11/07/2023    ECHOCARDIOGRAM REPORT   Patient Name:   Isaiah Jones Date of Exam: 11/07/2023 Medical Rec #:  [id number]       Height:       74.0 in Accession #:    [id number]      Weight:       257.0 lb Date of Birth:  1950/11/02        BSA:          2.418 m Patient Age:    73 years        BP:           102/72 mmHg Patient Gender: M               HR:           67 bpm. Exam Location:  [address] Procedure: 2D Echo, 3D Echo and Strain Analysis (Both Spectral and Color Flow            Doppler were utilized during procedure). Indications:    I50.22 Chronic systolic (congestive) heart failure  History:        Patient has prior history of Echocardiogram examinations, most                 recent 02/22/2023. CAD, Abnormal ECG, Arrythmias:Atrial                 Fibrillation and LBBB; Risk Factors:Hypertension, Dyslipidemia,                 Sleep Apnea and Former Smoker. NICM. Chronic kidney disease.  Sonographer:    Jon Hacker RCS Referring Phys: [id number] DARRYLE NED O'NEAL IMPRESSIONS  1. Left ventricular ejection fraction, by estimation, is 40 to 45%. Left  ventricular ejection fraction by 3D volume is 41 %. The left ventricle has mildly decreased function. The left ventricle demonstrates global hypokinesis. There is mild concentric left ventricular hypertrophy. Left ventricular diastolic function could not be evaluated. The average left ventricular global longitudinal strain is -14.2 %. The global longitudinal strain is abnormal.  2. Right ventricular systolic function is normal. The right ventricular size is  normal.  3. The mitral valve is normal in structure. No evidence of mitral valve regurgitation. No evidence of mitral stenosis.  4. The aortic valve is normal in structure. Aortic valve regurgitation is not visualized. No aortic stenosis is present.  5. Aortic dilatation noted. There is mild dilatation of the ascending aorta, measuring 39 mm.  6. The inferior vena cava is normal in size with greater than 50% respiratory variability, suggesting right atrial pressure of 3 mmHg.  7. Compared to echo 02/22/23, LVF has improved. FINDINGS  Left Ventricle: Left ventricular ejection fraction, by estimation, is 40 to 45%. Left ventricular ejection fraction by 3D volume is 41 %. The left ventricle has mildly decreased function. The left ventricle demonstrates global hypokinesis. The average left ventricular global longitudinal strain is -14.2 %. Strain was performed and the global longitudinal strain is abnormal. The left ventricular internal cavity size was normal in size. There is mild concentric left ventricular hypertrophy. Left ventricular diastolic function could not be evaluated. Right Ventricle: The right ventricular size is normal. No increase in right ventricular wall thickness. Right ventricular systolic function is normal. Left Atrium: Left atrial size was normal in size. Right Atrium: Right atrial size was normal in size. Pericardium: There is no evidence of pericardial effusion. Mitral Valve: The mitral valve is normal in structure. No evidence of mitral valve regurgitation. No evidence of mitral valve stenosis. Tricuspid Valve: The tricuspid valve is normal in structure. Tricuspid valve regurgitation is trivial. No evidence of tricuspid stenosis. Aortic Valve: The aortic valve is normal in structure. Aortic valve regurgitation is not visualized. No aortic stenosis is present. Pulmonic Valve: The pulmonic valve was normal in structure. Pulmonic valve regurgitation is not visualized. No evidence of pulmonic  stenosis. Aorta: Aortic dilatation noted. There is mild dilatation of the ascending aorta, measuring 39 mm. Venous: The inferior vena cava is normal in size with greater than 50% respiratory variability, suggesting right atrial pressure of 3 mmHg. IAS/Shunts: No atrial level shunt detected by color flow Doppler. Additional Comments: 3D was performed not requiring image post processing on an independent workstation and was abnormal.  LEFT VENTRICLE PLAX 2D LVIDd:         5.29 cm         Diastology LVIDs:         3.92 cm         LV e' medial:  7.61 cm/s LV PW:         1.20 cm         LV e' lateral: 7.62 cm/s LV IVS:        1.10 cm LVOT diam:     2.40 cm         2D Longitudinal LV SV:         59              Strain LV SV Index:   24              2D Strain GLS   -12.7 % LVOT Area:     4.52 cm        (  A4C):                                2D Strain GLS   -13.4 %                                (A3C):                                2D Strain GLS   -16.4 %                                (A2C):                                2D Strain GLS   -14.2 %                                Avg:                                 3D Volume EF                                LV 3D EF:    Left                                             ventricul                                             ar                                             ejection                                             fraction                                             by 3D                                             volume is  41 %.                                 3D Volume EF:                                3D EF:        41 %                                LV EDV:       152 ml                                LV ESV:       89 ml                                LV SV:        62 ml RIGHT VENTRICLE RV Basal diam:  2.45 cm RV S prime:     12.00 cm/s TAPSE (M-mode): 1.7 cm LEFT ATRIUM             Index        RIGHT ATRIUM            Index LA diam:        3.70 cm 1.53 cm/m   RA Area:     18.50 cm LA Vol (A2C):   62.5 ml 25.85 ml/m  RA Volume:   43.20 ml  17.87 ml/m LA Vol (A4C):   61.1 ml 25.27 ml/m LA Biplane Vol: 60.4 ml 24.98 ml/m  AORTIC VALVE LVOT Vmax:   73.05 cm/s LVOT Vmean:  44.650 cm/s LVOT VTI:    0.130 m  AORTA Ao Root diam: 3.70 cm Ao Asc diam:  3.90 cm  SHUNTS Systemic VTI:  0.13 m Systemic Diam: 2.40 cm Wilbert Bihari MD Electronically signed by Wilbert Bihari MD Signature Date/Time: 11/07/2023/1:06:44 PM    Final          ASSESSMENT & PLAN   Assessment & Plan Groin pain, right Right inguinal region pain, possible mononeuropathy   He experiences intermittent severe burning pain in the right inguinal region for the past three weeks, with no specific activity or movement exacerbating it. Physical examination reveals slight redness along the inguinal ligament, with no hernia detected. Differential diagnosis includes mononeuropathy, low-grade cellulitis, or an inflamed vein causing nerve irritation. Order an ultrasound of the right inguinal region. Prescribe gabapentin  for nerve pain and a lidocaine  patch for topical pain relief. Prescribe antifungal powder for potential skin fold irritation. Cellulitis of left upper extremity Cellulitis and skin infections of left upper limb and legs   Numerous tiny skin scabs are present on the legs, with an infected spot on the left upper limb. He declined oral antibiotics, preferring topical treatment. Prescribe topical antibiotics for skin infections. Send a prescription for oral antibiotics to [facility] in case the condition worsens. 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  General Health Maintenance   Weight management and GLP-1 medications were discussed, with insurance coverage being a barrier. He has lost weight from 318 to 237  pounds and  aims for 220 pounds. Consideration of reducing the GLP-1 medication dose was discussed. Continue monitoring weight and consider dose adjustment of GLP-1 medication if needed.   ORDER ASSOCIATIONS  #   DIAGNOSIS / CONDITION ICD-10 ENCOUNTER ORDER     ICD-10-CM   1. Groin pain, right  R10.31 US  Pelvis Complete    2. Cellulitis of left upper extremity  L03.114 cephALEXin  (KEFLEX ) 500 MG capsule    3. Ilioinguinal neuralgia, right  G57.91 gabapentin  (NEURONTIN ) 300 MG capsule    nystatin  (MYCOSTATIN /NYSTOP ) powder           Orders Placed in Encounter:   Lab Orders  No laboratory test(s) ordered today   Imaging Orders         US  Pelvis Complete     Referral Orders  No referral(s) requested today   Meds ordered this encounter  Medications   cephALEXin  (KEFLEX ) 500 MG capsule    Sig: Take 1 capsule (500 mg total) by mouth 3 (three) times daily.    Dispense:  30 capsule    Refill:  0   gabapentin  (NEURONTIN ) 300 MG capsule    Sig: Take 1 capsule (300 mg total) by mouth 3 (three) times daily.    Dispense:  90 capsule    Refill:  3   nystatin  (MYCOSTATIN /NYSTOP ) powder    Sig: Apply 1 Application topically 3 (three) times daily.    Dispense:  60 g    Refill:  11       This document was synthesized by artificial intelligence (Abridge) using HIPAA-compliant recording of the clinical interaction;   We discussed the use of AI scribe software for clinical note transcription with the patient, who gave verbal consent to proceed. additional Info: This encounter employed state-of-the-art, real-time, collaborative documentation. The patient actively reviewed and assisted in updating their electronic medical record on a shared screen, ensuring transparency and facilitating joint problem-solving for the problem list, overview, and plan. This approach promotes accurate, informed care. The treatment plan was discussed and reviewed in detail, including medication safety, potential side effects,  and all patient questions. We confirmed understanding and comfort with the plan. Follow-up instructions were established, including contacting the office for any concerns, returning if symptoms worsen, persist, or new symptoms develop, and precautions for potential emergency department visits.

## 2024-01-07 NOTE — Patient Instructions (Addendum)
 It was a pleasure seeing you today! Your health and satisfaction are our top priorities.  Bernardino Cone, MD  VISIT SUMMARY: During your visit, we discussed the severe intermittent pain you have been experiencing in your right inguinal region, as well as your skin issues and chronic venous insufficiency. We also reviewed your impressive weight loss progress and discussed your current weight management plan.  YOUR PLAN: -RIGHT INGUINAL REGION PAIN, POSSIBLE MONONEUROPATHY: This pain might be due to nerve irritation in the area. We will conduct an ultrasound to investigate further. You have been prescribed gabapentin  to help with nerve pain and a lidocaine  patch for topical pain relief. Additionally, an antifungal powder has been prescribed to address any potential skin fold irritation.  -CELLULITIS AND SKIN INFECTIONS OF LEFT UPPER LIMB AND LEGS: Cellulitis is a bacterial skin infection that can cause redness, swelling, and pain. You have been prescribed topical antibiotics to treat the skin infections. A prescription for oral antibiotics has also been sent to [facility] in case your condition worsens.  -CHRONIC VENOUS INSUFFICIENCY OF LOWER EXTREMITIES: Chronic venous insufficiency occurs when the veins in your legs have trouble sending blood back to your heart, leading to skin changes and swelling. We will continue to monitor this condition and manage it as needed.  -GENERAL HEALTH MAINTENANCE: We discussed your weight management and the use of GLP-1 medications. You have made significant progress in your weight loss journey, reducing from 318 pounds to 237 pounds, with a goal of reaching 220 pounds. We will continue to monitor your weight and consider adjusting your medication dose if necessary.  INSTRUCTIONS: Please schedule an ultrasound for your right inguinal region as soon as possible. Use the prescribed gabapentin  and lidocaine  patch as directed for pain relief. Apply the antifungal  powder to any irritated skin folds. Use the topical antibiotics on your skin infections, and if your condition worsens, fill the prescription for oral antibiotics at [facility]. Continue monitoring your weight and follow up with us  to discuss any necessary adjustments to your GLP-1 medication.  Your Providers PCP: Cone Bernardino MATSU, MD,  [phone]) Referring Provider: Cone Bernardino MATSU, MD,  [phone]) Care Team Provider: Dann Candyce RAMAN, MD,  [phone]) Care Team Provider: Carolee Sherwood JONETTA DOUGLAS, MD,  [phone]) Care Team Provider: Tobie Erm, MD,  [phone]) Care Team Provider: Eluterio Cramp, MD,  [phone]) Care Team Provider: Sheril Coy, MD,  [phone]) Care Team Provider: [name]  [phone]) Care Team Provider: Cesario Boer, MD,  [phone]) Care Team Provider: Barbaraann Darryle Ned, MD,  [phone]) Care Team Provider: Inocencio Soyla Lunger, MD,  [phone]) Care Team Provider: Federico Rosario BROCKS, MD,  [phone])  NEXT STEPS: [x]  Early Intervention: Schedule sooner appointment, call our on-call services, or go to emergency room if there is any significant Increase in pain or discomfort New or worsening symptoms Sudden or severe changes in your health [x]  Flexible Follow-Up: We recommend a No follow-ups on file. for optimal routine care. This allows for progress monitoring and treatment adjustments. [x]  Preventive Care: Schedule your annual preventive care visit! It's typically covered by insurance and helps identify potential health issues early. [x]  Lab & X-ray Appointments: Incomplete tests scheduled today, or call to schedule. X-rays: [facility] (M-F, 8:30am-noon or 1pm-5pm). [x]  Medical Information Release: Sign a release form at front desk to obtain relevant medical information we don't have.  MAKING THE MOST OF OUR FOCUSED 20 MINUTE APPOINTMENTS: [x]   Clearly state your top concerns at the beginning  of the visit to focus our discussion [x]   If you anticipate you will need more time, please inform the front desk during scheduling - we can book multiple appointments in the same week. [x]   If you have transportation problems- use our convenient video appointments or ask about transportation support. [x]   We can get down to business faster if you use MyChart to update information before the visit and submit non-urgent questions before your visit. Thank you for taking the time to provide details through MyChart.  Let our nurse know and she can import this information into your encounter documents.  Arrival and Wait Times: [x]   Arriving on time ensures that everyone receives prompt attention. [x]   Early morning (8a) and afternoon (1p) appointments tend to have shortest wait times. [x]   Unfortunately, we cannot delay appointments for late arrivals or hold slots during phone calls.  Getting Answers and Following Up [x]   Simple Questions & Concerns: For quick questions or basic follow-up after your visit, reach us  at [phone] or MyChart messaging. [x]   Complex Concerns: If your concern is more complex, scheduling an appointment might be best. Discuss this with the staff to find the most suitable option. [x]   Lab & Imaging Results: We'll contact you directly if results are abnormal or you don't use MyChart. Most normal results will be on MyChart within 2-3 business days, with a review message from Dr. Jesus. Haven't heard back in 2 weeks? Need results sooner? Contact us  at [phone]. [x]   Referrals: Our referral coordinator will manage specialist referrals. The specialist's office should contact you within 2 weeks to schedule an appointment. Call us  if you haven't heard from them after 2 weeks.  Staying Connected [x]   MyChart: Activate your MyChart for the fastest way to access results and message us . See the last page of this paperwork for instructions on how to activate.  Bring to Your  Next Appointment [x]   Medications: Please bring all your medication bottles to your next appointment to ensure we have an accurate record of your prescriptions. [x]   Health Diaries: If you're monitoring any health conditions at home, keeping a diary of your readings can be very helpful for discussions at your next appointment.  Billing [x]   X-ray & Lab Orders: These are billed by separate companies. Contact the invoicing company directly for questions or concerns. [x]   Visit Charges: Discuss any billing inquiries with our administrative services team.  Your Satisfaction Matters [x]   Share Your Experience: We strive for your satisfaction! If you have any complaints, or preferably compliments, please let Dr. Jesus know directly or contact our Practice Administrators, Manuelita Rubin or [employer], by asking at the front desk.   Reviewing Your Records [x]   Review this early draft of your clinical encounter notes below and the final encounter summary tomorrow on MyChart after its been completed.  All orders placed so far are visible here: Groin pain, right -     US  PELVIS (TRANSABDOMINAL ONLY); Future  Cellulitis of left upper extremity -     Cephalexin ; Take 1 capsule (500 mg total) by mouth 3 (three) times daily.  Dispense: 30 capsule; Refill: 0  Ilioinguinal neuralgia, right -     Gabapentin ; Take 1 capsule (300 mg total) by mouth 3 (three) times daily.  Dispense: 90 capsule; Refill: 3 -     Nystatin ; Apply 1 Application topically 3 (three) times daily.  Dispense: 60 g; Refill: 11         Tirzepatide can  be purchased online more affordably; but we don't recommend  Dr. Ida H. Jesus, MD [facility], [place] Board-certified in Otolaryngology, Dr. Jesus specializes in general ENT procedures, including nasal surgeries such as septoplasty and turbinate reduction. He practices at [facility].  [facility] Dr.  Vaughan D. Carlie, MD [place], [place] With over 21 years of experience, Dr. Carlie offers comprehensive ENT services, including nasal reconstructive surgeries. He practices at the [facility].  [facility] Dr. Elspeth Coddington, MD [place], [place] Dr. Coddington provides a wide range of ENT services, including nasal surgeries. He is part of the team at the [facility].  [facility] Dr. Daniel Dois Moccasin, MD, MS, [facility], [place] Dr. Moccasin specializes in various ENT services, including nasal surgeries. He practices at [address], [address].  [facility] Dr. Lonni BRAVO. Ethyl, MD [place], [place] Dr. Ethyl offers general ENT services, including nasal surgeries. He practices at [address]  [email or web address]

## 2024-01-07 NOTE — Telephone Encounter (Signed)
 Prescription refill request for Xarelto  received.  Indication: AF Last office visit: 5/25 Weight: 116.6 Age: 73 Scr: 0.99 CrCl: 108

## 2024-01-10 ENCOUNTER — Ambulatory Visit
Admission: RE | Admit: 2024-01-10 | Discharge: 2024-01-10 | Disposition: A | Discharge status: Home / Self Care | Source: Ambulatory Visit | Attending: Internal Medicine | Admitting: Internal Medicine

## 2024-01-10 DIAGNOSIS — R1031 Right lower quadrant pain: Secondary | ICD-10-CM

## 2024-01-13 ENCOUNTER — Other Ambulatory Visit: Payer: Self-pay | Admitting: Internal Medicine

## 2024-01-13 DIAGNOSIS — I429 Cardiomyopathy, unspecified: Secondary | ICD-10-CM

## 2024-01-13 DIAGNOSIS — E7849 Other hyperlipidemia: Secondary | ICD-10-CM

## 2024-01-15 ENCOUNTER — Ambulatory Visit: Payer: Self-pay | Admitting: Internal Medicine

## 2024-01-15 ENCOUNTER — Telehealth: Payer: Self-pay | Admitting: Radiology

## 2024-01-15 NOTE — Telephone Encounter (Signed)
 US  PELVIS marked for review today.

## 2024-01-15 NOTE — Progress Notes (Signed)
 Get update on pain status and what he wants to do next.

## 2024-01-20 NOTE — Progress Notes (Unsigned)
 Electrophysiology Office Note:   Date:  01/22/2024  ID:  Isaiah Jones, DOB 12/05/1950, MRN [id number]  Primary Cardiologist: Darryle ONEIDA Decent, MD Primary Heart Failure: None Electrophysiologist: Will Gladis Norton, MD      History of Present Illness:   Isaiah Jones is a 73 y.o. male with h/o AF, NICM / HFrEF s/p CRT-D, HLD seen today for routine electrophysiology followup.   Since last being seen in our clinic the patient reports doing well. He has started back riding bikes but worries about being on the roads. He does ride MTB trails in [place] as well. He has been riding 60-75 mintues with HR's 80-118bpm.  He is happy his EF came up.  Denies device related concerns. He states he does have positional thumping in his chest at times.  He states he was previously was programmed to get the best EKG result and reduction that sensation + battery life.     He denies chest pain, palpitations, dyspnea, PND, orthopnea, nausea, vomiting, dizziness, syncope, edema, weight gain, or early satiety.   Review of systems complete and found to be negative unless listed in HPI.    EP Information / Studies Reviewed:    EKG is not ordered today. EKG from 07/17/23 reviewed which showed AV dual paced rhythm 80 bpm       ICD Interrogation-  reviewed in detail today,  See PACEART report.  Device History: Abbott BiV ICD implanted 03/16/23 for HFrEF, NICM, LBBB.  History of appropriate therapy: No History of AAD therapy: Yes; currently on amiodarone    Device Optimization: implant LV 1-2 vector, at some point was changed to 1-4 for unclear reasons, no stim noted on any vectors L1-2 best threshold 1.5V @ 0.32ms & simultaneously gave best QRS morphology. Base pacing rate increased to 80 bpm to suppress PVC's. Confirmed diaphragmatic stim at 4V with leaning forward / none at 2V   Arrhythmia / AAD / Pertinent EP Studies AF > initial dx 2018  Amiodarone  05/2023 >  DCCV 05/17/23 >200j x1, 300j > NSR   Risk  Assessment/Calculations:    CHA2DS2-VASc Score = 4   This indicates a 4.8% annual risk of stroke. The patient's score is based upon: CHF History: 1 HTN History: 1 Diabetes History: 0 Stroke History: 0 Vascular Disease History: 1 Age Score: 1 Gender Score: 0             Physical Exam:   VS:  BP 109/78   Pulse 77   Ht 6' 2 (1.88 m)   Wt 238 lb (108 kg)   SpO2 97%   BMI 30.56 kg/m    Wt Readings from Last 3 Encounters:  01/22/24 238 lb (108 kg)  01/07/24 237 lb 9.6 oz (107.8 kg)  09/28/23 257 lb (116.6 kg)     GEN: pleasant, well nourished, well developed in no acute distress NECK: No JVD; No carotid bruits CARDIAC: Regular rate and rhythm, no murmurs, rubs, gallops RESPIRATORY:  Clear to auscultation without rales, wheezing or rhonchi  ABDOMEN: Soft, non-tender, non-distended EXTREMITIES:  No edema; No deformity   ASSESSMENT AND PLAN:    Chronic Systolic Dysfunction  s/p Abbott CRT-D  LBBB -euvolemic on exam / by device    -97% BiV pacing  -Stable on an appropriate medical regimen -Normal ICD function -See Pace Art report -No changes today -LRL at 80 bpm due to PVC's  Persistent Atrial Fibrillation  CHA2DS2-VASc 4 -OAC for stroke prophylaxis  -amiodarone  200 mg daily   -LFT's in  08/2023 wnl > he is going back to his PCP in the next few weeks.  Will ask for f/u labs then as anticipate other lab work.  -0% burden on device  -Toprol  50 mg daily   Secondary Hypercoagulable State  -continue Xarelto     Disposition:   Follow up with Dr. Inocencio in 12 months   Signed, Daphne Barrack, NP-C, AGACNP-BC  [facility] - Elect[facility]iology  01/22/2024, 12:21 PM

## 2024-01-22 ENCOUNTER — Encounter: Payer: Self-pay | Admitting: Pulmonary Disease

## 2024-01-22 ENCOUNTER — Ambulatory Visit: Attending: Pulmonary Disease | Admitting: Pulmonary Disease

## 2024-01-22 VITALS — BP 109/78 | HR 77 | Ht 74.0 in | Wt 238.0 lb

## 2024-01-22 DIAGNOSIS — I5022 Chronic systolic (congestive) heart failure: Secondary | ICD-10-CM | POA: Diagnosis not present

## 2024-01-22 DIAGNOSIS — Z95810 Presence of automatic (implantable) cardiac defibrillator: Secondary | ICD-10-CM | POA: Insufficient documentation

## 2024-01-22 DIAGNOSIS — I447 Left bundle-branch block, unspecified: Secondary | ICD-10-CM | POA: Diagnosis not present

## 2024-01-22 DIAGNOSIS — I4819 Other persistent atrial fibrillation: Secondary | ICD-10-CM | POA: Insufficient documentation

## 2024-01-22 DIAGNOSIS — D6869 Other thrombophilia: Secondary | ICD-10-CM | POA: Insufficient documentation

## 2024-01-22 LAB — CUP PACEART INCLINIC DEVICE CHECK
Date Time Interrogation Session: 20250923122516
Implantable Lead Connection Status: 753985
Implantable Lead Connection Status: 753985
Implantable Lead Connection Status: 753985
Implantable Lead Implant Date: 20241115
Implantable Lead Implant Date: 20241115
Implantable Lead Implant Date: 20241115
Implantable Lead Location: 753858
Implantable Lead Location: 753859
Implantable Lead Location: 753860
Implantable Pulse Generator Implant Date: 20241115
Pulse Gen Serial Number: 810108245

## 2024-01-22 NOTE — Patient Instructions (Signed)
 Medication Instructions:  Your physician recommends that you continue on your current medications as directed. Please refer to the Current Medication list given to you today.  *If you need a refill on your cardiac medications before your next appointment, please call your pharmacy*  Lab Work: Have your primary care provider draw a TSH, FreeT4 and LFT'S in 2 months when you see them If you have labs (blood work) drawn today and your tests are completely normal, you will receive your results only by: MyChart Message (if you have MyChart) OR A paper copy in the mail If you have any lab test that is abnormal or we need to change your treatment, we will call you to review the results.  Follow-Up: At [facility], you and your health needs are our priority.  As part of our continuing mission to provide you with exceptional heart care, our providers are all part of one team.  This team includes your primary Cardiologist (physician) and Advanced Practice Providers or APPs (Physician Assistants and Nurse Practitioners) who all work together to provide you with the care you need, when you need it.  Your next appointment:   1 year(s)  Provider:   Soyla Norton, MD or Daphne Barrack, NP

## 2024-01-23 ENCOUNTER — Ambulatory Visit: Payer: Self-pay | Admitting: Cardiology

## 2024-01-23 NOTE — Progress Notes (Signed)
Remote ICD Transmission.

## 2024-01-28 ENCOUNTER — Telehealth: Payer: Self-pay | Admitting: Cardiovascular Disease

## 2024-01-28 DIAGNOSIS — I4819 Other persistent atrial fibrillation: Secondary | ICD-10-CM

## 2024-01-28 DIAGNOSIS — Z79899 Other long term (current) drug therapy: Secondary | ICD-10-CM

## 2024-01-28 NOTE — Telephone Encounter (Signed)
 Spoke to patient. He would like the labs to placed in the system. He states at last visit  with B.Ollis NP 01/22/24    Medication Instructions:  Your physician recommends that you continue on your current medications as directed. Please refer to the Current Medication list given to you today.  *If you need a refill on your cardiac medications before your next appointment, please call your pharmacy*   Lab Work: Have your primary care provider draw a TSH, FreeT4 and LFT'S in 2 months when you see them     RN informed patient lbs have been placed to [employer]. Patient states she will be going to PCP to have labs drawn before Mar 04, 2024 appt with PCP

## 2024-01-28 NOTE — Telephone Encounter (Signed)
 Patient says PCP, Dr. Jesus, needs orders for lab work to be placed. He says he needs TSH, LFT, and free T4

## 2024-01-31 ENCOUNTER — Other Ambulatory Visit: Payer: Self-pay | Admitting: General Surgery

## 2024-01-31 DIAGNOSIS — K4090 Unilateral inguinal hernia, without obstruction or gangrene, not specified as recurrent: Secondary | ICD-10-CM

## 2024-02-04 ENCOUNTER — Ambulatory Visit
Admission: RE | Admit: 2024-02-04 | Discharge: 2024-02-04 | Disposition: A | Discharge status: Home / Self Care | Source: Ambulatory Visit | Attending: General Surgery | Admitting: General Surgery

## 2024-02-04 DIAGNOSIS — K4090 Unilateral inguinal hernia, without obstruction or gangrene, not specified as recurrent: Secondary | ICD-10-CM

## 2024-02-04 MED ORDER — IOPAMIDOL (ISOVUE-300) INJECTION 61%
100.0000 mL | Freq: Once | INTRAVENOUS | Status: AC | PRN
Start: 2024-02-04 — End: 2024-02-04
  Administered 2024-02-04: 100 mL via INTRAVENOUS

## 2024-02-05 ENCOUNTER — Telehealth: Payer: Self-pay

## 2024-02-05 NOTE — Telephone Encounter (Signed)
   Pre-operative Risk Assessment    Patient Name: Isaiah Jones  DOB: Nov 29, 1950 MRN: [id number]   Date of last office visit: 01/22/24 DAPHNE BARRACK, NP Date of next office visit: NONE   Request for Surgical Clearance    Procedure:  URGENT RIGHT INGUINAL HERNIA REPAIR  Date of Surgery:  Clearance TBD    URGENT (PER REQUEST)                            Surgeon:  DR ELSPETH SCHULTZE Surgeon's Group or Practice Name:  [facility] Phone number:  [phone] Fax number:  [phone]   ATTN: RUSSELL CHRISTINE, CMA   Type of Clearance Requested:   - Medical  - Pharmacy:  Hold Rivaroxaban  (Xarelto )     Type of Anesthesia:  General    Additional requests/questions:    Signed, Lucie DELENA Ku   02/05/2024, 5:12 PM

## 2024-02-06 ENCOUNTER — Ambulatory Visit: Attending: General Practice

## 2024-02-06 ENCOUNTER — Telehealth (HOSPITAL_BASED_OUTPATIENT_CLINIC_OR_DEPARTMENT_OTHER): Payer: Self-pay | Admitting: *Deleted

## 2024-02-06 DIAGNOSIS — Z01810 Encounter for preprocedural cardiovascular examination: Secondary | ICD-10-CM

## 2024-02-06 NOTE — Telephone Encounter (Signed)
 Received a message from Russell Christine, CMA from [facility] that the correct doctor on the clearance request should be the following.   Dr Donnice Bury

## 2024-02-06 NOTE — Telephone Encounter (Signed)
   Name: Isaiah Jones  DOB: [birth year]  MRN: [id number]  Primary Cardiologist: Darryle ONEIDA Decent, MD   Preoperative team, please contact this patient and set up a phone call appointment URGENT per surgeon for further preoperative risk assessment. Please obtain consent and complete medication review. Thank you for your help.  I confirm that guidance regarding antiplatelet and oral anticoagulation therapy has been completed and, if necessary, noted below.  Per pharmacy on 02/06/2024 Per office protocol, patient can hold Xarelto  for 2 days prior to procedure.    I also confirmed the patient resides in the state of [place] . As per [facility] Medical Board telemedicine laws, the patient must reside in the state in which the provider is licensed.   Lamarr Satterfield, NP 02/06/2024, 12:10 PM [facility]

## 2024-02-06 NOTE — Telephone Encounter (Signed)
 Pharmacy please advise on holding Xarelto  prior to  URGENT RIGHT INGUINAL HERNIA REPAIR  scheduled for ASAP/URGENT. Last labs 08/30/2023 Thank you.

## 2024-02-06 NOTE — Progress Notes (Signed)
 Virtual Visit via Telephone Note   Because of Isaiah Jones co-morbid illnesses, he is at least at moderate risk for complications without adequate follow up.  This format is felt to be most appropriate for this patient at this time.  Due to technical limitations with video connection Web designer), today's appointment will be conducted as an audio only telehealth visit, and Isaiah Jones verbally agreed to proceed in this manner.   All issues noted in this document were discussed and addressed.  No physical exam could be performed with this format.  Evaluation Performed:  Preoperative cardiovascular risk assessment _____________   Date:  02/06/2024   Patient ID:  Isaiah Jones, DOB Jul 25, 1950, MRN [id number] Patient Location:  Home Provider location:   Office  Primary Care Provider:  Jesus Bernardino MATSU, MD Primary Cardiologist:  Isaiah ONEIDA Decent, MD  Chief Complaint / Patient Profile   73 y.o. y/o male with a h/o CAD, chronic systolic CHF, LBBB, BiV cardioverter-defibrillator, persistent atrial fibrillation who is pending right inguinal hernia repair and presents today for telephonic preoperative cardiovascular risk assessment.  History of Present Illness    Isaiah Jones is a 73 y.o. male who presents via audio/video conferencing for a telehealth visit today.  Pt was last seen in cardiology clinic on 01/22/2024 by Isaiah Barrack, NP.  At that time Isaiah Jones was doing well .  The patient is now pending procedure as outlined above. Since his last visit, he remains stable from a cardiac standpoint.  Today he denies chest pain, shortness of breath, lower extremity edema, fatigue, palpitations, melena, hematuria, hemoptysis, diaphoresis, weakness, presyncope, syncope, orthopnea, and PND.   Past Medical History    Past Medical History:  Diagnosis Date   Anxiety    Arthritis, degenerative    hip,shoulders, hands, back   Benign hypertensive heart disease without congestive heart  failure    BMI 45.0-49.9, adult (HCC) 03/09/2018   CHF (congestive heart failure) (HCC)    Chronic kidney disease    Colon polyps    Complication of anesthesia    Depression    Dyslipidemia    Dysrhythmia 2017   A fib   GERD (gastroesophageal reflux disease)    Glaucoma    early stage   History of 2019 novel coronavirus disease (COVID-19) 06/2019   History of kidney stones    Hypertension    Hypertriglyceridemia 03/14/2022   Lab Results Component Value Date/Time  TRIG 154.0 (H) 03/13/2022 08:53 AM  TRIG 188.0 (H) 06/14/2021 09:38 AM  TRIG 203.0 (H) 06/11/2020 09:38 AM  TRIG 166.0 (H) 06/11/2019 09:44 AM  TRIG 177 (H) 02/18/2019 08:36 AM     OSA on CPAP    C-Pap   Persistent atrial fibrillation (HCC)    Echo 3/18: Mild LVH, EF 55-60, normal wall motion, grade 1 diastolic dysfunction, mild LAE   Personal history of gout 05/23/2019   Pneumonia    childhood   PONV (postoperative nausea and vomiting)    after 1st knee surgery only   Rhinitis, allergic    Past Surgical History:  Procedure Laterality Date   arthroscopic knee surgery Bilateral 2003   BIV ICD INSERTION CRT-D N/A 03/16/2023   Procedure: BIV ICD INSERTION CRT-D;  Surgeon: Isaiah Soyla Lunger, MD;  Location[facility] INVASIVE CV LAB;  Service: Cardiovascular;  Laterality: N/A;   CARDIOVERSION N/A 08/10/2016   Procedure: CARDIOVERSION;  Surgeon: Isaiah JAYSON Maxcy, MD;  Location: [facility] ENDOSCOPY;  Service: Cardiovascular;  Laterality: N/A;   CARDIOVERSION  N/A 05/17/2023   Procedure: CARDIOVERSION;  Surgeon: Isaiah Pugh, DO;  Location: [facility] INVASIVE CV LAB;  Service: Cardiovascular;  Laterality: N/A;   COLONOSCOPY     CORONARY PRESSURE/FFR STUDY N/A 09/22/2022   Procedure: CORONARY PRESSURE/FFR STUDY;  Surgeon: Isaiah Candyce RAMAN, MD;  Location: [facility] INVASIVE CV LAB;  Service: Cardiovascular;  Laterality: N/A;   CYSTOSCOPY WITH RETROGRADE PYELOGRAM, URETEROSCOPY AND STENT PLACEMENT Left 03/04/2021   Procedure: CYSTOSCOPY WITH  BILATERAL RETROGRADES,PYELOGRAM, URETEROSCOPY AND STENT PLACEMENT;  Surgeon: Isaiah Hummer, MD;  Location: [facility] ORS;  Service: Urology;  Laterality: Left;  1 HR   CYSTOSCOPY WITH RETROGRADE PYELOGRAM, URETEROSCOPY AND STENT PLACEMENT Right 01/12/2023   Procedure: CYSTOSCOPY WITH RIGHT RETROGRADE PYELOGRAM, URETEROSCOPY AND STENT PLACEMENT;  Surgeon: Isaiah Sherwood JONETTA DOUGLAS, MD;  Location: [facility] ORS;  Service: Urology;  Laterality: Right;  1 HR FOR CASE   ESOPHAGEAL MANOMETRY N/A 03/22/2022   Procedure: ESOPHAGEAL MANOMETRY (EM);  Surgeon: Isaiah Rosario BROCKS, MD;  Location: [facility] ENDOSCOPY;  Service: Gastroenterology;  Laterality: N/A;   HOLMIUM LASER APPLICATION Left 03/04/2021   Procedure: HOLMIUM LASER APPLICATION;  Surgeon: Isaiah Hummer, MD;  Location: [facility] ORS;  Service: Urology;  Laterality: Left;   IMPLANTABLE CARDIOVERTER DEFIBRILLATOR GENERATOR CHANGE  03/16/2023   LAPAROSCOPIC GASTRIC SLEEVE RESECTION N/A 07/15/2019   Procedure: LAPAROSCOPIC GASTRIC SLEEVE RESECTION, Upper Endo, ERAS Pathway;  Surgeon: Isaiah Cough, MD;  Location: [facility] ORS;  Service: General;  Laterality: N/A;   REPLACEMENT TOTAL KNEE Bilateral 2008   RIGHT/LEFT HEART CATH AND CORONARY ANGIOGRAPHY N/A 09/22/2022   Procedure: RIGHT/LEFT HEART CATH AND CORONARY ANGIOGRAPHY;  Surgeon: Isaiah Candyce RAMAN, MD;  Location: [facility] INVASIVE CV LAB;  Service: Cardiovascular;  Laterality: N/A;   TONSILLECTOMY     TOTAL HIP ARTHROPLASTY Right 10/21/2019   Procedure: RIGHT TOTAL HIP ARTHROPLASTY ANTERIOR APPROACH;  Surgeon: Sheril Coy, MD;  Location: [facility] ORS;  Service: Orthopedics;  Laterality: Right;   VASECTOMY      Allergies  Allergies  Allergen Reactions   Erythromycin Other (See Comments)    Upset stomch    Home Medications    Prior to Admission medications   Medication Sig Start Date End Date Taking? Authorizing Provider  acetaminophen  (TYLENOL ) 500 MG tablet Take 500-1,000 mg by mouth every 6 (six) hours as needed for  headache.    [provider]  allopurinol  (ZYLOPRIM ) 100 MG tablet TAKE 1 TABLET DAILY . DO NOT START UNTIL STEROIDS HAVED CALMED DOWN THE ATTACK, BUT DO NOT STOP IF AN ATTACK RETURNS Patient not taking: Reported on 02/06/2024 09/29/23   Isaiah Bernardino MATSU, MD  allopurinol  (ZYLOPRIM ) 300 MG tablet TAKE 1 TABLET DAILY 09/24/23   Isaiah Bernardino MATSU, MD  amiodarone  (PACERONE ) 200 MG tablet Take 1 tablet (200 mg total) by mouth daily. 06/26/23   Leverne Charlies Helling, PA-C  calcium  citrate (CALCITRATE - DOSED IN MG ELEMENTAL CALCIUM ) 950 (200 Ca) MG tablet Take 200 mg of elemental calcium  by mouth 3 (three) times daily.    [provider]  Cholecalciferol (VITAMIN D3) 125 MCG (5000 UT) TABS Take 5,000 Units by mouth daily.    [provider]  colchicine  0.6 MG tablet Take 1 tablet (0.6 mg total) by mouth daily. Patient taking differently: Take 0.6 mg by mouth daily as needed (gout flares). 10/02/22   Isaiah Bernardino MATSU, MD  Cyanocobalamin  (VITAMIN B-12) 5000 MCG SUBL Place under the tongue.    [provider]  diphenhydramine -acetaminophen  (TYLENOL  PM) 25-500 MG TABS tablet Take 2 tablets by mouth at  bedtime.     [provider]  empagliflozin  (JARDIANCE ) 10 MG TABS tablet Take 1 tablet (10 mg total) by mouth daily before breakfast. 06/19/23   Jerilynn Lamarr HERO, NP  fexofenadine (ALLEGRA) 180 MG tablet Take 180 mg by mouth daily as needed for allergies or rhinitis.    [provider]  Ginkgo 60 MG TABS Take 60 mg by mouth daily.    [provider]  ipratropium-albuterol  (DUONEB) 0.5-2.5 (3) MG/3ML SOLN Take 3 mLs by nebulization every 4 (four) hours as needed. 01/24/23   Isaiah Bernardino MATSU, MD  latanoprost  (XALATAN ) 0.005 % ophthalmic solution Place 1 drop into both eyes at bedtime. 12/20/23   [provider]  methocarbamol  (ROBAXIN -750) 750 MG tablet Take 1 tablet (750 mg total) by mouth 4 (four) times daily. Patient taking differently: Take 750 mg  by mouth as needed for muscle spasms. 01/05/23   Harris, Abigail, PA-C  metoprolol  succinate (TOPROL -XL) 50 MG 24 hr tablet Take 1 tablet (50 mg total) by mouth daily. Take with or immediately following a meal. 04/11/23   Camnitz, Will Gladis, MD  mometasone (NASONEX) 50 MCG/ACT nasal spray Place 1 spray into the nose at bedtime.    [provider]  Multiple Vitamin (MULTIVITAMIN WITH MINERALS) TABS tablet Take 1 tablet by mouth 2 (two) times daily. Bariatric Multivitamin    [provider]  Multiple Vitamins-Minerals (AIRBORNE PO) Take 1 tablet by mouth daily as needed (immune health).     [provider]  Omega-3 Fatty Acids (FISH OIL PO) Take 60 capsules by mouth daily. Krill oil    [provider]  REPATHA  SURECLICK 140 MG/ML SOAJ INJECT 140MG  INTO THE SKIN EVERY 14 DAYS 01/13/24   Isaiah Bernardino MATSU, MD  rosuvastatin  (CRESTOR ) 40 MG tablet TAKE 1 TABLET DAILY 09/24/23   Isaiah Bernardino MATSU, MD  sacubitril-valsartan  (ENTRESTO ) 49-51 MG Take 1 tablet by mouth 2 (two) times daily. 05/16/23   O'NealDarryle Ned, MD  Semaglutide -Weight Management (WEGOVY ) 2.4 MG/0.75ML SOAJ Inject 2.4 mg into the skin once a week. 06/21/23   Isaiah Bernardino MATSU, MD  sertraline  (ZOLOFT ) 50 MG tablet TAKE 1 TABLET DAILY 10/25/23   Isaiah Bernardino MATSU, MD  spironolactone  (ALDACTONE ) 25 MG tablet TAKE 1/2 TABLET(12.5MG      TOTAL) DAILY 05/16/23   O'Neal, Isaiah Ned, MD  thiamine (VITAMIN B-1) 100 MG tablet Take 100 mg by mouth daily. 09/04/22   [provider]  XARELTO  20 MG TABS tablet TAKE 1 TABLET DAILY WITH   SUPPER 01/07/24   O'Neal, Isaiah Ned, MD    Physical Exam    Vital Signs:  Isaiah Jones does not have vital signs available for review today.  Given telephonic nature of communication, physical exam is limited. AAOx3. NAD. Normal affect.  Speech and respirations are unlabored.  Accessory Clinical Findings    None  Assessment & Plan    1.  Preoperative Cardiovascular  Risk Assessment: RIGHT INGUINAL HERNIA REPAIR   Date of Surgery:  Clearance TBD    URGENT (PER REQUEST)                              Surgeon:  DR ELSPETH SCHULTZE Surgeon's Group or Practice Name:  [facility] Phone number:  [phone] Fax number:  [phone]   ATTN: RUSSELL CHRISTINE, CMA      Primary Cardiologist: Isaiah ONEIDA Decent, MD  Chart reviewed as part of pre-operative protocol coverage.  Given past medical history and time since last visit, based on ACC/AHA guidelines, Isaiah Jones would be at acceptable risk for the planned procedure without further cardiovascular testing.   His RCRI is moderate risk, 6.6% risk of major cardiac event.  He is able to complete greater than 4 METS of physical activity.  Patient was advised that if he develops new symptoms prior to surgery to contact our office to arrange a follow-up appointment.  He verbalized understanding.  His Xarelto  may be held for 2 days prior to his procedure.  Please resume as soon as hemostasis is achieved.  I will route this recommendation to the requesting party via Epic fax function and remove from pre-op pool.       Time:   Today, I have spent  5 minutes with the patient with telehealth technology discussing medical history, symptoms, and management plan.  I spent 10 minutes reviewing patient's past cardiac history and cardiac medications.    Josefa CHRISTELLA Beauvais, NP  02/06/2024, 12:39 PM

## 2024-02-06 NOTE — Telephone Encounter (Signed)
 Pt has been added on today for tele preop appt 02/06/24, urgent request, see notes.  Med rec and consent are done.     Patient Consent for Virtual Visit        Isaiah Jones has provided verbal consent on 02/06/2024 for a virtual visit (video or telephone).   CONSENT FOR VIRTUAL VISIT FOR:  Isaiah Jones  By participating in this virtual visit I agree to the following:  I hereby voluntarily request, consent and authorize [facility] and its employed or contracted physicians, physician assistants, nurse practitioners or other licensed health care professionals (the Practitioner), to provide me with telemedicine health care services (the "Services) as deemed necessary by the treating Practitioner. I acknowledge and consent to receive the Services by the Practitioner via telemedicine. I understand that the telemedicine visit will involve communicating with the Practitioner through live audiovisual communication technology and the disclosure of certain medical information by electronic transmission. I acknowledge that I have been given the opportunity to request an in-person assessment or other available alternative prior to the telemedicine visit and am voluntarily participating in the telemedicine visit.  I understand that I have the right to withhold or withdraw my consent to the use of telemedicine in the course of my care at any time, without affecting my right to future care or treatment, and that the Practitioner or I may terminate the telemedicine visit at any time. I understand that I have the right to inspect all information obtained and/or recorded in the course of the telemedicine visit and may receive copies of available information for a reasonable fee.  I understand that some of the potential risks of receiving the Services via telemedicine include:  Delay or interruption in medical evaluation due to technological equipment failure or disruption; Information transmitted  may not be sufficient (e.g. poor resolution of images) to allow for appropriate medical decision making by the Practitioner; and/or  In rare instances, security protocols could fail, causing a breach of personal health information.  Furthermore, I acknowledge that it is my responsibility to provide information about my medical history, conditions and care that is complete and accurate to the best of my ability. I acknowledge that Practitioner's advice, recommendations, and/or decision may be based on factors not within their control, such as incomplete or inaccurate data provided by me or distortions of diagnostic images or specimens that may result from electronic transmissions. I understand that the practice of medicine is not an exact science and that Practitioner makes no warranties or guarantees regarding treatment outcomes. I acknowledge that a copy of this consent can be made available to me via my patient portal [facility] MyChart), or I can request a printed copy by calling the office of [facility].    I understand that my insurance will be billed for this visit.   I have read or had this consent read to me. I understand the contents of this consent, which adequately explains the benefits and risks of the Services being provided via telemedicine.  I have been provided ample opportunity to ask questions regarding this consent and the Services and have had my questions answered to my satisfaction. I give my informed consent for the services to be provided through the use of telemedicine in my medical care

## 2024-02-06 NOTE — Telephone Encounter (Signed)
 Pt has been added on today for tele preop appt 02/06/24, urgent request, see notes.  Med rec and consent are done.

## 2024-02-06 NOTE — Telephone Encounter (Signed)
 Patient with diagnosis of A Fib on Xarelto  for anticoagulation.    Procedure: URGENT RIGHT INGUINAL HERNIA REPAIR  Date of procedure: TBD urgent   CHA2DS2-VASc Score = 4  This indicates a 4.8% annual risk of stroke. The patient's score is based upon: CHF History: 1 HTN History: 1 Diabetes History: 0 Stroke History: 0 Vascular Disease History: 1 Age Score: 1 Gender Score: 0  CrCl 100 ml/min Platelet count 190K  Patient has not had an Afib/aflutter ablation in the last 3 months, DCCV within the last 4 weeks or a watchman implanted in the last 45 days    Per office protocol, patient can hold Xarelto  for 2 days prior to procedure.    **This guidance is not considered finalized until pre-operative APP has relayed final recommendations.**

## 2024-02-08 ENCOUNTER — Other Ambulatory Visit

## 2024-02-09 ENCOUNTER — Other Ambulatory Visit: Payer: Self-pay | Admitting: Internal Medicine

## 2024-02-09 DIAGNOSIS — I429 Cardiomyopathy, unspecified: Secondary | ICD-10-CM

## 2024-02-09 DIAGNOSIS — E7849 Other hyperlipidemia: Secondary | ICD-10-CM

## 2024-02-11 ENCOUNTER — Other Ambulatory Visit: Payer: Self-pay | Admitting: General Surgery

## 2024-02-11 NOTE — Progress Notes (Signed)
 PROVIDER:  DONNICE CARLIN BURY, MD  MRN: [id number] DOB: 18-Apr-1951 DATE OF ENCOUNTER: 02/11/2024 Subjective     Chief Complaint: Hernia     History of Present Illness: 73 year old male with multiple medical problems. He has a prior laparoscopic sleeve gastrectomy by Dr. Gladis. He comes in to see me today for right groin pain that has been occurring for about 3 to 4 weeks. This is not really associated with anything including activity. He notes that it is in his right groin and radiates down to his inner thigh. This actually is occurring somewhat on his left now as well. He does note some swelling in his right groin. He also is complaining of some upper abdominal pain associated with this. He is up-to-date on his colonoscopy. He has been constipated for the past couple of months. He has no nausea vomiting associated with this. He does have an ultrasound of his right groin that was done with him supine that is negative  I sent him for CT scan read as negative but I think there is abnormality in the right groin . I discussed this with radiology myself and both of us  agree there may be hydrocele or something there. He also has separate ab pain that I think is related to constipation.     Medical History: Past Medical History:  Diagnosis Date  . Atrial fibrillation (CMS/HHS-HCC)   . Carotid artery occlusion   . Coronary artery disease   . Fracture of lower end of tibia, unspecified laterality, open type III, initial encounter   . High cholesterol   . Hypertension   . Motion sickness   . Sleep apnea     Patient Active Problem List  Diagnosis  . High cholesterol  . Personal history of gout  . Essential hypertension  . BMI 45.0-49.9, adult (CMS-HCC)  . Depression, major, in remission ()  . Dyslipidemia  . Erectile dysfunction  . OSA on CPAP  . Osteoarthritis  . Seasonal allergies  . Achalasia  . Obstructive sleep apnea  . Prostate cancer (CMS/HHS-HCC)  . Persistent atrial  fibrillation (CMS/HHS-HCC)  . LBBB (left bundle branch block)  . Hypertensive heart disease with heart failure (CMS/HHS-HCC)  . Gout  . Former smoker  . S/P laparoscopic sleeve gastrectomy  . Achalasia of esophagus    Past Surgical History:  Procedure Laterality Date  . arthroscopic knee surgery  1997  . JOINT REPLACEMENT Right 2022   hip  . gastric sleeve  2022  . EGD N/A 06/07/2022   Procedure: ESOPHAGOGASTRODUODENOSCOPY, FLEXIBLE, TRANSORAL; DIAGNOSTIC, INCLUDING COLLECTION OF SPECIMEN(S) BY BRUSHING OR WASHING, WHEN PERFORMED (SEPARATE PROCEDURE);  Surgeon: Murlean Lang Fess, MD;  Location: [facility]PERATING [facility]S;  Service: Cardiothoracic;  Laterality: N/A;  . colonoscopy    . Kidney Stones Surgery    . REPLACEMENT TOTAL KNEE BILATERAL    . TONSILLECTOMY  1945     Allergies  Allergen Reactions  . Erythromycin Other (See Comments)    Upset stomch    Current Outpatient Medications on File Prior to Visit  Medication Sig Dispense Refill  . AMOXICILLIN  ORAL Take by mouth Dental premed-joint replacements    . CALCIUM  CITRATE ORAL Take 500 mg by mouth 3 (three) times a day    . diphenhydramine -acetaminophen  (TYLENOL  PM) 25-500 mg per tablet Take 2 tablets by mouth at bedtime    . ENTRESTO  49-51 mg tablet Take 1 tablet by mouth 2 (two) times daily    . geriatric multivitamin-min tablet Take 1 tablet by  mouth 2 (two) times daily    . ginkgo biloba 40 mg Tab Take by mouth.    . METHOCARBAMOL  ORAL Take 750 mg by mouth once daily as needed    . mometasone (NASONEX) 50 mcg/actuation nasal spray by Nasal route.    . mv-mn/C/glutamin/lysin/herb124 (AIRBORNE, ASCORBATE SODIUM, ORAL) Take by mouth    . REPATHA  SYRINGE 140 mg/mL Syrg Inject 140 mg subcutaneously every 14 (fourteen) days    . rivaroxaban  (XARELTO ) 20 mg tablet Take by mouth.    . rosuvastatin  (CRESTOR ) 40 MG tablet Take 40 mg by mouth once daily    . sertraline  (ZOLOFT ) 50 MG tablet Take 50 mg by mouth at bedtime    .  spironolactone  (ALDACTONE ) 25 MG tablet Take 12.5 mg by mouth once daily    . thiamine (VITAMIN B-1) 100 MG tablet Take by mouth    . WEGOVY  1.7 mg/0.75 mL pen injector Inject 2.4 mg subcutaneously once a week    . BIOTIN ORAL Take 2,500 mcg by mouth once daily    . latanoprost  (XALATAN ) 0.005 % ophthalmic solution Place 1 drop into both eyes at bedtime for 90 days 7.5 mL 4  . rosuvastatin  (CRESTOR ) 20 MG tablet Take 20 mg by mouth once daily (Patient not taking: Reported on 01/31/2024)     No current facility-administered medications on file prior to visit.    Family History  Problem Relation Age of Onset  . Alzheimer's disease Mother   . Myocardial Infarction (Heart attack) Mother   . Myocardial Infarction (Heart attack) Father   . Glaucoma Father   . High blood pressure (Hypertension) Father   . Throat cancer Father   . Macular degeneration Neg Hx   . Anesthesia problems Neg Hx      Social History   Tobacco Use  Smoking Status Former  . Current packs/day: 0.00  . Average packs/day: 1.5 packs/day for 35.0 years (52.5 ttl pk-yrs)  . Types: Cigarettes  . Start date: 56  . Quit date: 2002  . Years since quitting: 23.7  Smokeless Tobacco Never  Tobacco Comments   05/04/22: Smoked off and on for 35 years. 1 - 1.5ppd     Social History   Socioeconomic History  . Marital status: Married  Tobacco Use  . Smoking status: Former    Current packs/day: 0.00    Average packs/day: 1.5 packs/day for 35.0 years (52.5 ttl pk-yrs)    Types: Cigarettes    Start date: 62    Quit date: 2002    Years since quitting: 23.7  . Smokeless tobacco: Never  . Tobacco comments:    05/04/22: Smoked off and on for 35 years. 1 - 1.5ppd  Vaping Use  . Vaping status: Never Used  Substance and Sexual Activity  . Alcohol use: Yes    Comment: social (one a week or two)  . Drug use: Never  . Sexual activity: Yes    Partners: Female   Social Drivers of Corporate investment banker Strain: Low  Risk  (01/07/2024)   Received from [facility]   Overall Financial Resource Strain (CARDIA)   . How hard is it for you to pay for the very basics like food, housing, medical care, and heating?: Not very hard  Food Insecurity: No Food Insecurity (01/07/2024)   Received from [facility]   Hunger Vital Sign   . Within the past 12 months, you worried that your food would run out before you got the money to buy  more.: Never true   . Within the past 12 months, the food you bought just didn't last and you didn't have money to get more.: Never true  Transportation Needs: No Transportation Needs (01/07/2024)   Received from [facility] - Transportation   . In the past 12 months, has lack of transportation kept you from medical appointments or from getting medications?: No   . In the past 12 months, has lack of transportation kept you from meetings, work, or from getting things needed for daily living?: No  Physical Activity: Sufficiently Active (01/07/2024)   Received from [facility]   Exercise Vital Sign   . On average, how many days per week do you engage in moderate to strenuous exercise (like a brisk walk)?: 3 days   . On average, how many minutes do you engage in exercise at this level?: 90 min  Stress: No Stress Concern Present (01/07/2024)   Received from [facility] - Occupational Stress Questionnaire   . Do you feel stress - tense, restless, nervous, or anxious, or unable to sleep at night because your mind is troubled all the time - these days?: Not at all  Social Connections: Moderately Integrated (01/07/2024)   Received from [facility]   Social Connection and Isolation Panel   . In a typical week, how many times do you talk on the phone with family, friends, or neighbors?: Three times a week   . How often do you get together with friends or relatives?: Once a week   . How often do you attend church or religious services?: More than 4 times per  year   . Do you belong to any clubs or organizations such as church groups, unions, fraternal or athletic groups, or school groups?: No   . Are you married, widowed, divorced, separated, never married, or living with a partner?: Married  Housing Stability: Unknown (01/31/2024)   Housing Stability Vital Sign   . Unable to Pay for Housing in the Last Year: No   . Homeless in the Last Year: No    Objective:    There were no vitals filed for this visit.  There is no height or weight on file to calculate BMI.  Physical Exam   General nad Right groin with swelling, tender No LIH  Assessment and Plan:     ? RIH, hydrocele  I do think there is something in this groin possible hernia or cyst?  I think best plan would be to explore his groin anteriorly where I can feel this area.  Will do it this week.  Discussed excising mass if needed vs hernia repair  I described the procedure in detail.  The patient was given educational material.  Goals should be achieved with surgery. We discussed the usage of mesh and the rationale behind that. We went over the pathophysiology of inguinal hernia. We have elected to perform open inguinal hernia repair with mesh.  We discussed the risks including bleeding, infection, recurrence, postoperative pain and chronic groin pain, testicular injury, urinary retention, numbness in groin and around incision.  MATTHEW CARLIN BURY, MD

## 2024-02-13 ENCOUNTER — Encounter: Payer: Self-pay | Admitting: Cardiology

## 2024-02-13 ENCOUNTER — Other Ambulatory Visit: Payer: Self-pay

## 2024-02-13 ENCOUNTER — Encounter (HOSPITAL_COMMUNITY): Payer: Self-pay | Admitting: General Surgery

## 2024-02-13 NOTE — Anesthesia Preprocedure Evaluation (Signed)
 Anesthesia Evaluation  Patient identified by MRN, date of birth, ID band Patient awake    Reviewed: Allergy & Precautions, NPO status , Patient's Chart, lab work & pertinent test results  History of Anesthesia Complications (+) PONV and history of anesthetic complications  Airway Mallampati: III  TM Distance: >3 FB Neck ROM: Full    Dental   Pulmonary sleep apnea , former smoker   breath sounds clear to auscultation       Cardiovascular hypertension, Pt. on medications + CAD and +CHF  + dysrhythmias + Cardiac Defibrillator  Rhythm:Regular Rate:Normal     Neuro/Psych negative neurological ROS     GI/Hepatic Neg liver ROS,GERD  ,,  Endo/Other  negative endocrine ROS    Renal/GU CRFRenal disease     Musculoskeletal  (+) Arthritis ,    Abdominal   Peds  Hematology negative hematology ROS (+)   Anesthesia Other Findings   Reproductive/Obstetrics                             CV: Echo 11/07/2023: IMPRESSIONS   1. Left ventricular ejection fraction, by estimation, is 40 to 45%. Left  ventricular ejection fraction by 3D volume is 41 %. The left ventricle has  mildly decreased function. The left ventricle demonstrates global  hypokinesis. There is mild concentric  left ventricular hypertrophy. Left ventricular diastolic function could  not be evaluated. The average left ventricular global longitudinal strain  is -14.2 %. The global longitudinal strain is abnormal.   2. Right ventricular systolic function is normal. The right ventricular  size is normal.   3. The mitral valve is normal in structure. No evidence of mitral valve  regurgitation. No evidence of mitral stenosis.   4. The aortic valve is normal in structure. Aortic valve regurgitation is  not visualized. No aortic stenosis is present.   5. Aortic dilatation noted. There is mild dilatation of the ascending  aorta, measuring 39 mm.    6. The inferior vena cava is normal in size with greater than 50%  respiratory variability, suggesting right atrial pressure of 3 mmHg.   7. Compared to echo 02/22/23 [EF 25-30%], LVF has improved.      Long term monitor 12/25/2022 - 01/08/2023: .  Persistent atrial fibrillation with occasional rapid ventricular response. Average HR 79 bpm. .  Several pauses > 3 seconds, mostly during sleeping hours. .  Frequent PVCs. Short runs of nonsustained ventricular tachycardia. .  Continue with plans for EP referral in the setting of LV dysfunction.     Cardiac cath 09/22/2022: .  Prox RCA lesion is 50% stenosed.  RFR 0.99.  Not significant. SABRA Pais Cx to Prox Cx lesion is 25% stenosed. .  1st Diag lesion is 50% stenosed. .  Mid LAD lesion is 25% stenosed. .  There is moderate to severe left ventricular systolic dysfunction. .  LV end diastolic pressure is normal. .  The left ventricular ejection fraction is 30-35% by visual estimate. .  There is no aortic valve stenosis. .  Aortic saturation 91%, PA saturation 72%, mean RA pressure 4 mmHg, PA pressure 21/12, mean PA pressure 17 mmHg, mean pulmonary capillary wedge pressure 8 mmHg, cardiac output 8.15 L/min, cardiac index 3.18.   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  Normal right heart pressures.  Continue aggressive medical therapy for LV dysfunction, atrial fibrillation and coronary artery disease.  If he does develop significant heart failure symptoms, would  have to consider biventricular pacing given his left bundle branch block.   Anesthesia Physical Anesthesia Plan  ASA: 3  Anesthesia Plan: General   Post-op Pain Management: Regional block* and Tylenol  PO (pre-op)*   Induction: Intravenous  PONV Risk Score and Plan: 3 and Dexamethasone , Ondansetron  and Treatment may vary due to age or medical condition  Airway Management Planned: Oral ETT  Additional Equipment:   Intra-op Plan:   Post-operative Plan: Extubation  in OR  Informed Consent: I have reviewed the patients History and Physical, chart, labs and discussed the procedure including the risks, benefits and alternatives for the proposed anesthesia with the patient or authorized representative who has indicated his/her understanding and acceptance.     Dental advisory given  Plan Discussed with: CRNA  Anesthesia Plan Comments: (PAT note written 02/13/2024 by Alejandro Gamel, PA-C.  )         Anesthesia Quick Evaluation

## 2024-02-13 NOTE — Progress Notes (Signed)
 SDW call  Patient was given pre-op instructions over the phone. Patient verbalized understanding of instructions provided.     PCP - Dr. Bernardino Cone Cardiologist - Dr. Darryle Decent EP Cardiologist: Dr. Soyla Norton Pulmonary:    PPM/ICD - ICD, [employer] Device Orders - received and on chart Rep Notified - na   Chest x-ray - 06/29/2023 EKG -  07/17/2023 Stress Test - ECHO - 11/07/2023 Cardiac Cath - 09/22/2022  Sleep Study/sleep apnea/CPAP: OSA with nightly CPAP  Non-diabetic  GLP1: Wegovy , last dose 02/10/2024   Blood Thinner Instructions: Xarelto , hold 2 days, states last dose 02/11/2024 Aspirin  Instructions:denies   ERAS Protcol - Clears until 0700   Anesthesia review: Yes. CHF, HTN, A-Fib OSA with CPAP, ICD, on xarelto    Patient denies shortness of breath, fever, cough and chest pain over the phone call  Your procedure is scheduled on Thursday February 14, 2024  Report to [facility] Main Entrance A at  0730  A.M., then check in with the Admitting office.  Call this number if you have problems the morning of surgery:  [phone]   If you have any questions prior to your surgery date call [phone]: Open Monday-Friday 8am-4pm If you experience any cold or flu symptoms such as cough, fever, chills, shortness of breath, etc. between now and your scheduled surgery, please notify us  at the above number    Remember:  Do not eat after midnight the night before your surgery  You may drink clear liquids until   0700  the morning of your surgery.   Clear liquids allowed are: Water , Non-Citrus Juices (without pulp), Carbonated Beverages, Clear Tea, Black Coffee ONLY (NO MILK, CREAM OR POWDERED CREAMER of any kind), and Gatorade   Take these medicines the morning of surgery with A SIP OF WATER :  Allopurinol , amiodarone , metoprolol , crestor , zoloft   As needed: Tylenol , colchicine , duoneb, robaxin   As of today, STOP taking any Aspirin  (unless otherwise instructed by your  surgeon) Aleve, Naproxen, Ibuprofen, Motrin, Advil, Goody's, BC's, all herbal medications, fish oil, and all vitamins.

## 2024-02-13 NOTE — Progress Notes (Signed)
 PERIOPERATIVE PRESCRIPTION FOR IMPLANTED CARDIAC DEVICE PROGRAMMING  Patient Information: Name:  Isaiah Jones  DOB:  [birth year]  MRN:  [id number]   Planned Procedure: Right groin hernia repair versus mass excision  Surgeon: Dr. Donnice Bury  Date of Procedure: 02/14/2024  Cautery will be used.  Position during surgery: Supine   Device Information:  Clinic EP Physician:  Soyla Norton, MD   Device Type:  Defibrillator Manufacturer and Phone #:  [employer]Abbott: [phone] Pacemaker Dependent?:  No. Date of Last Device Check: 01/22/2024  Normal Device Function?:  Yes.    Electrophysiologist's Recommendations:  Have magnet available. Provide continuous ECG monitoring when magnet is used or reprogramming is to be performed.  Procedure may interfere with device function.  Magnet should be placed over device during procedure.  Per Device Clinic Standing Orders, Isaiah Jones, [place]  8:14 AM 02/13/2024

## 2024-02-13 NOTE — Progress Notes (Signed)
 Anesthesia Chart Review: Isaiah Jones  Case: [id number] Date/Time: 02/14/24 0947   Procedure: REPAIR, HERNIA, INGUINAL, ADULT (Right) - RIGHT INGUINAL HERNIA  GEN/TAP BLOCK   Anesthesia type: General   Pre-op diagnosis: RIGHT INGUINAL HERNIA   [facility] ROOM 12 / [facility]   Surgeons: Ebbie Cough, MD       DISCUSSION: Patient is a 73 year old male scheduled for the above procedure.  History includes former smoker (quit 07/04/2001), postoperative N/V, HTN, CAD (mild-moderate 09/22/22), NICM, HFrEF, LBBB, BiV ICD/CRT-D (05/16/2023, [employer]) afib (s/p unsuccessful DCCV 08/10/2016, successful 05/17/2023), dyslipidemia, GERD, CKD, OSA, nephrolithiasis, BPH, osteoarthritis (bilateral TKA, revisions 01/15/2007; right THA 10/21/2019), spinal surgery, bariatric surgery (laparoscopic gastric sleeve resection 07/15/2019).  EP cardiology APP office visit 01/22/2024. He had started bike riding again. EF had improved from 25-30% up to 40-45% on 10/2023 TTE following CRT-D in January. He had normal device function.  Preoperative cardiology input outlined by Emelia Hazy, NP on 02/06/2024, Given past medical history and time since last visit, based on ACC/AHA guidelines, [name] would be at acceptable risk for the planned procedure without further cardiovascular testing.    His RCRI is moderate risk, 6.6% risk of major cardiac event.  He is able to complete greater than 4 METS of physical activity.SABRASABRAHis Xarelto  may be held for 2 days prior to his procedure. Please resume as soon as hemostasis is achieved.   EP perioperative cardiac device programming recommendations: Device Information: Clinic EP Physician:  Soyla Norton, MD  Device Type:  Defibrillator Manufacturer and Phone #:  [employer]: [phone] Pacemaker Dependent?:  No. Date of Last Device Check: 01/22/2024       Normal Device Function?:  Yes.     Electrophysiologist's Recommendations: Have magnet available. Provide  continuous ECG monitoring when magnet is used or reprogramming is to be performed.  Procedure may interfere with device function.  Magnet should be placed over device during procedure.  Last Xarelto  reported as 02/11/2024.  He in on Wegovy  per Weight Management. Last Wegovy  reported as 02/10/2024. Case is posted for general anesthesia.   He is on Jardiance  for CHF. No known DM. Surgery is within 24 hours, so will only have him hold on the morning of surgery.   Anesthesia team to evaluate on the day of surgery.    VS:  Wt Readings from Last 3 Encounters:  01/22/24 108 kg  01/07/24 107.8 kg  09/28/23 116.6 kg   BP Readings from Last 3 Encounters:  01/22/24 109/78  01/07/24 110/70  09/28/23 102/72   Pulse Readings from Last 3 Encounters:  01/22/24 77  01/07/24 75  09/28/23 81    PROVIDERS: Jesus Bernardino MATSU, MD is PCP  Barbaraann Kotyk, MD is primary cardiologist Norton, Will, MD is EP cardiologist   LABS: For day of surgery as indicated. Most recent results in [facility] include: Lab Results  Component Value Date   WBC 5.7 08/30/2023   HGB 15.5 08/30/2023   HCT 46.7 08/30/2023   PLT 190.0 08/30/2023   GLUCOSE 79 08/30/2023   CHOL 86 08/30/2023   TRIG 87.0 08/30/2023   HDL 54.60 08/30/2023   LDLDIRECT 76.0 06/11/2020   LDLCALC 14 08/30/2023   ALT 21 08/30/2023   AST 25 08/30/2023   NA 141 08/30/2023   K 4.1 08/30/2023   CL 103 08/30/2023   CREATININE 0.99 08/30/2023   BUN 13 08/30/2023   CO2 32 08/30/2023   TSH 2.580 06/26/2023   HGBA1C 5.3 08/30/2023    IMAGES:  CXR 06/26/2023: FINDINGS: No change in the left subclavian bipolar ICDF pacemaker device tip of the leads in good position no evidence of discontinuity no infiltrates or consolidations or pulmonary edema. No pleural effusions IMPRESSION: No active cardiopulmonary disease.   EKG: 07/17/2023:  AV dual-paced rhythm with occasional ventricular-paced complexes Biventricular pacemaker detected Confirmed  by Aniceto Jarvis ([id number]) on 07/17/2023 8:16:39 AM   CV: Echo 11/07/2023: IMPRESSIONS   1. Left ventricular ejection fraction, by estimation, is 40 to 45%. Left  ventricular ejection fraction by 3D volume is 41 %. The left ventricle has  mildly decreased function. The left ventricle demonstrates global  hypokinesis. There is mild concentric  left ventricular hypertrophy. Left ventricular diastolic function could  not be evaluated. The average left ventricular global longitudinal strain  is -14.2 %. The global longitudinal strain is abnormal.   2. Right ventricular systolic function is normal. The right ventricular  size is normal.   3. The mitral valve is normal in structure. No evidence of mitral valve  regurgitation. No evidence of mitral stenosis.   4. The aortic valve is normal in structure. Aortic valve regurgitation is  not visualized. No aortic stenosis is present.   5. Aortic dilatation noted. There is mild dilatation of the ascending  aorta, measuring 39 mm.   6. The inferior vena cava is normal in size with greater than 50%  respiratory variability, suggesting right atrial pressure of 3 mmHg.   7. Compared to echo 02/22/23 [EF 25-30%], LVF has improved.    Long term monitor 12/25/2022 - 01/08/2023:   Persistent atrial fibrillation with occasional rapid ventricular response. Average HR 79 bpm.   Several pauses > 3 seconds, mostly during sleeping hours.   Frequent PVCs. Short runs of nonsustained ventricular tachycardia.   Continue with plans for EP referral in the setting of LV dysfunction.   Cardiac cath 09/22/2022:   Prox RCA lesion is 50% stenosed.  RFR 0.99.  Not significant.   Ost Cx to Prox Cx lesion is 25% stenosed.   1st Diag lesion is 50% stenosed.   Mid LAD lesion is 25% stenosed.   There is moderate to severe left ventricular systolic dysfunction.   LV end diastolic pressure is normal.   The left ventricular ejection fraction is 30-35% by visual estimate.   There  is no aortic valve stenosis.   Aortic saturation 91%, PA saturation 72%, mean RA pressure 4 mmHg, PA pressure 21/12, mean PA pressure 17 mmHg, mean pulmonary capillary wedge pressure 8 mmHg, cardiac output 8.15 L/min, cardiac index 3.18.   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  Normal right heart pressures.  Continue aggressive medical therapy for LV dysfunction, atrial fibrillation and coronary artery disease.  If he does develop significant heart failure symptoms, would have to consider biventricular pacing given his left bundle branch block.    Past Medical History:  Diagnosis Date   AICD (automatic cardioverter/defibrillator) present    Anxiety    Arthritis, degenerative    hip,shoulders, hands, back   Benign hypertensive heart disease without congestive heart failure    BMI 45.0-49.9, adult (HCC) 03/09/2018   CHF (congestive heart failure) (HCC)    Chronic kidney disease    Colon polyps    Complication of anesthesia    Depression    Dyslipidemia    Dysrhythmia 2017   A fib   GERD (gastroesophageal reflux disease)    Glaucoma    early stage   History of 2019 novel coronavirus disease (COVID-19) 06/2019  History of kidney stones    Hypertension    Hypertriglyceridemia 03/14/2022   Lab Results Component Value Date/Time  TRIG 154.0 (H) 03/13/2022 08:53 AM  TRIG 188.0 (H) 06/14/2021 09:38 AM  TRIG 203.0 (H) 06/11/2020 09:38 AM  TRIG 166.0 (H) 06/11/2019 09:44 AM  TRIG 177 (H) 02/18/2019 08:36 AM     OSA on CPAP    C-Pap   Persistent atrial fibrillation (HCC)    Echo 3/18: Mild LVH, EF 55-60, normal wall motion, grade 1 diastolic dysfunction, mild LAE   Personal history of gout 05/23/2019   Pneumonia    childhood   PONV (postoperative nausea and vomiting)    after 1st knee surgery only   Rhinitis, allergic     Past Surgical History:  Procedure Laterality Date   arthroscopic knee surgery Bilateral 2003   BIV ICD INSERTION CRT-D N/A 03/16/2023    Procedure: BIV ICD INSERTION CRT-D;  Surgeon: Inocencio Soyla Lunger, MD;  Location: [facility] INVASIVE CV LAB;  Service: Cardiovascular;  Laterality: N/A;   CARDIOVERSION N/A 08/10/2016   Procedure: CARDIOVERSION;  Surgeon: Vinie JAYSON Maxcy, MD;  Location: [facility] ENDOSCOPY;  Service: Cardiovascular;  Laterality: N/A;   CARDIOVERSION N/A 05/17/2023   Procedure: CARDIOVERSION;  Surgeon: Sheena Pugh, DO;  Location: [facility] INVASIVE CV LAB;  Service: Cardiovascular;  Laterality: N/A;   COLONOSCOPY     CORONARY PRESSURE/FFR STUDY N/A 09/22/2022   Procedure: CORONARY PRESSURE/FFR STUDY;  Surgeon: Dann Candyce RAMAN, MD;  Location: [facility] INVASIVE CV LAB;  Service: Cardiovascular;  Laterality: N/A;   CYSTOSCOPY WITH RETROGRADE PYELOGRAM, URETEROSCOPY AND STENT PLACEMENT Left 03/04/2021   Procedure: CYSTOSCOPY WITH BILATERAL RETROGRADES,PYELOGRAM, URETEROSCOPY AND STENT PLACEMENT;  Surgeon: Alvaro Hummer, MD;  Location: [facility] ORS;  Service: Urology;  Laterality: Left;  1 HR   CYSTOSCOPY WITH RETROGRADE PYELOGRAM, URETEROSCOPY AND STENT PLACEMENT Right 01/12/2023   Procedure: CYSTOSCOPY WITH RIGHT RETROGRADE PYELOGRAM, URETEROSCOPY AND STENT PLACEMENT;  Surgeon: Carolee Sherwood JONETTA DOUGLAS, MD;  Location: [facility] ORS;  Service: Urology;  Laterality: Right;  1 HR FOR CASE   ESOPHAGEAL MANOMETRY N/A 03/22/2022   Procedure: ESOPHAGEAL MANOMETRY (EM);  Surgeon: Federico Rosario JAYSON, MD;  Location: [facility] ENDOSCOPY;  Service: Gastroenterology;  Laterality: N/A;   HOLMIUM LASER APPLICATION Left 03/04/2021   Procedure: HOLMIUM LASER APPLICATION;  Surgeon: Alvaro Hummer, MD;  Location: [facility] ORS;  Service: Urology;  Laterality: Left;   IMPLANTABLE CARDIOVERTER DEFIBRILLATOR GENERATOR CHANGE  03/16/2023   LAPAROSCOPIC GASTRIC SLEEVE RESECTION N/A 07/15/2019   Procedure: LAPAROSCOPIC GASTRIC SLEEVE RESECTION, Upper Endo, ERAS Pathway;  Surgeon: Lunger Cough, MD;  Location: [facility] ORS;  Service: General;  Laterality: N/A;   REPLACEMENT TOTAL KNEE Bilateral 2008    RIGHT/LEFT HEART CATH AND CORONARY ANGIOGRAPHY N/A 09/22/2022   Procedure: RIGHT/LEFT HEART CATH AND CORONARY ANGIOGRAPHY;  Surgeon: Dann Candyce RAMAN, MD;  Location: [facility] INVASIVE CV LAB;  Service: Cardiovascular;  Laterality: N/A;   TONSILLECTOMY     TOTAL HIP ARTHROPLASTY Right 10/21/2019   Procedure: RIGHT TOTAL HIP ARTHROPLASTY ANTERIOR APPROACH;  Surgeon: Sheril Coy, MD;  Location: [facility] ORS;  Service: Orthopedics;  Laterality: Right;   VASECTOMY      MEDICATIONS: No current facility-administered medications for this encounter.    acetaminophen  (TYLENOL ) 500 MG tablet   allopurinol  (ZYLOPRIM ) 100 MG tablet   allopurinol  (ZYLOPRIM ) 300 MG tablet   amiodarone  (PACERONE ) 200 MG tablet   calcium  citrate (CALCITRATE - DOSED IN MG ELEMENTAL CALCIUM ) 950 (200 Ca) MG tablet   Cholecalciferol (VITAMIN D3) 125 MCG (5000 UT) TABS  colchicine  0.6 MG tablet   Cyanocobalamin  (VITAMIN B-12) 5000 MCG SUBL   diphenhydramine -acetaminophen  (TYLENOL  PM) 25-500 MG TABS tablet   empagliflozin  (JARDIANCE ) 10 MG TABS tablet   fexofenadine (ALLEGRA) 180 MG tablet   Ginkgo 60 MG TABS   ipratropium-albuterol  (DUONEB) 0.5-2.5 (3) MG/3ML SOLN   latanoprost  (XALATAN ) 0.005 % ophthalmic solution   methocarbamol  (ROBAXIN -750) 750 MG tablet   metoprolol  succinate (TOPROL -XL) 50 MG 24 hr tablet   mometasone (NASONEX) 50 MCG/ACT nasal spray   Multiple Vitamin (MULTIVITAMIN WITH MINERALS) TABS tablet   Multiple Vitamins-Minerals (AIRBORNE PO)   nystatin  (MYCOSTATIN /NYSTOP ) powder   Omega-3 Fatty Acids (FISH OIL PO)   REPATHA  SURECLICK 140 MG/ML SOAJ   rosuvastatin  (CRESTOR ) 40 MG tablet   sacubitril-valsartan  (ENTRESTO ) 49-51 MG   Semaglutide -Weight Management (WEGOVY ) 2.4 MG/0.75ML SOAJ   sertraline  (ZOLOFT ) 50 MG tablet   spironolactone  (ALDACTONE ) 25 MG tablet   thiamine (VITAMIN B-1) 100 MG tablet   XARELTO  20 MG TABS tablet    Isaiah Ruder, PA-C Surgical Short Stay/Anesthesiology [facility]  Phone [phone] [facility] Phone [phone] 02/13/2024 2:16 PM

## 2024-02-14 ENCOUNTER — Ambulatory Visit (HOSPITAL_COMMUNITY)
Admission: RE | Admit: 2024-02-14 | Discharge: 2024-02-14 | Disposition: A | Discharge status: Home / Self Care | Attending: General Surgery | Admitting: General Surgery

## 2024-02-14 ENCOUNTER — Encounter (HOSPITAL_COMMUNITY): Admission: RE | Disposition: A | Payer: Self-pay | Source: Home / Self Care | Attending: General Surgery

## 2024-02-14 ENCOUNTER — Other Ambulatory Visit: Payer: Self-pay

## 2024-02-14 ENCOUNTER — Encounter (HOSPITAL_COMMUNITY): Payer: Self-pay | Admitting: General Surgery

## 2024-02-14 ENCOUNTER — Ambulatory Visit (HOSPITAL_COMMUNITY): Admitting: Vascular Surgery

## 2024-02-14 DIAGNOSIS — D176 Benign lipomatous neoplasm of spermatic cord: Secondary | ICD-10-CM | POA: Diagnosis not present

## 2024-02-14 DIAGNOSIS — Z96653 Presence of artificial knee joint, bilateral: Secondary | ICD-10-CM | POA: Diagnosis not present

## 2024-02-14 DIAGNOSIS — K5900 Constipation, unspecified: Secondary | ICD-10-CM | POA: Insufficient documentation

## 2024-02-14 DIAGNOSIS — N189 Chronic kidney disease, unspecified: Secondary | ICD-10-CM | POA: Insufficient documentation

## 2024-02-14 DIAGNOSIS — Z87891 Personal history of nicotine dependence: Secondary | ICD-10-CM | POA: Diagnosis not present

## 2024-02-14 DIAGNOSIS — I10 Essential (primary) hypertension: Secondary | ICD-10-CM

## 2024-02-14 DIAGNOSIS — Z95810 Presence of automatic (implantable) cardiac defibrillator: Secondary | ICD-10-CM | POA: Diagnosis not present

## 2024-02-14 DIAGNOSIS — I447 Left bundle-branch block, unspecified: Secondary | ICD-10-CM | POA: Diagnosis not present

## 2024-02-14 DIAGNOSIS — K4090 Unilateral inguinal hernia, without obstruction or gangrene, not specified as recurrent: Secondary | ICD-10-CM | POA: Insufficient documentation

## 2024-02-14 DIAGNOSIS — G4733 Obstructive sleep apnea (adult) (pediatric): Secondary | ICD-10-CM | POA: Insufficient documentation

## 2024-02-14 DIAGNOSIS — I2510 Atherosclerotic heart disease of native coronary artery without angina pectoris: Secondary | ICD-10-CM

## 2024-02-14 DIAGNOSIS — I5022 Chronic systolic (congestive) heart failure: Secondary | ICD-10-CM | POA: Insufficient documentation

## 2024-02-14 DIAGNOSIS — I130 Hypertensive heart and chronic kidney disease with heart failure and stage 1 through stage 4 chronic kidney disease, or unspecified chronic kidney disease: Secondary | ICD-10-CM | POA: Diagnosis not present

## 2024-02-14 DIAGNOSIS — I4819 Other persistent atrial fibrillation: Secondary | ICD-10-CM | POA: Diagnosis not present

## 2024-02-14 DIAGNOSIS — I428 Other cardiomyopathies: Secondary | ICD-10-CM | POA: Insufficient documentation

## 2024-02-14 DIAGNOSIS — R1030 Lower abdominal pain, unspecified: Secondary | ICD-10-CM | POA: Diagnosis present

## 2024-02-14 DIAGNOSIS — Z9884 Bariatric surgery status: Secondary | ICD-10-CM | POA: Insufficient documentation

## 2024-02-14 HISTORY — DX: Presence of automatic (implantable) cardiac defibrillator: Z95.810

## 2024-02-14 LAB — CBC
HCT: 45.2 % (ref 39.0–52.0)
Hemoglobin: 14.6 g/dL (ref 13.0–17.0)
MCH: 33 pg (ref 26.0–34.0)
MCHC: 32.3 g/dL (ref 30.0–36.0)
MCV: 102.3 fL — ABNORMAL HIGH (ref 80.0–100.0)
Platelets: 165 K/uL (ref 150–400)
RBC: 4.42 MIL/uL (ref 4.22–5.81)
RDW: 13.8 % (ref 11.5–15.5)
WBC: 5.6 K/uL (ref 4.0–10.5)
nRBC: 0 % (ref 0.0–0.2)

## 2024-02-14 LAB — BASIC METABOLIC PANEL WITH GFR
Anion gap: 9 (ref 5–15)
BUN: 21 mg/dL (ref 8–23)
CO2: 29 mmol/L (ref 22–32)
Calcium: 9.2 mg/dL (ref 8.9–10.3)
Chloride: 101 mmol/L (ref 98–111)
Creatinine, Ser: 1.03 mg/dL (ref 0.61–1.24)
GFR, Estimated: >60 mL/min (ref >60)
Glucose, Bld: 83 mg/dL (ref 70–99)
Potassium: 4.1 mmol/L (ref 3.5–5.1)
Sodium: 139 mmol/L (ref 135–145)

## 2024-02-14 SURGERY — REPAIR, HERNIA, INGUINAL, ADULT
Anesthesia: General | Site: Groin | Laterality: Right

## 2024-02-14 MED ORDER — CLONIDINE HCL (ANALGESIA) 100 MCG/ML EP SOLN
EPIDURAL | Status: DC | PRN
  Administered 2024-02-14: 50 ug

## 2024-02-14 MED ORDER — BUPIVACAINE-EPINEPHRINE (PF) 0.5% -1:200000 IJ SOLN
INTRAMUSCULAR | Status: DC | PRN
  Administered 2024-02-14: 30 mL

## 2024-02-14 MED ORDER — CHLORHEXIDINE GLUCONATE 0.12 % MT SOLN
15.0000 mL | Freq: Once | OROMUCOSAL | Status: AC
  Administered 2024-02-14: 15 mL via OROMUCOSAL
  Filled 2024-02-14: qty 15

## 2024-02-14 MED ORDER — ORAL CARE MOUTH RINSE: 15.0000 mL | Freq: Once | OROMUCOSAL | Status: AC

## 2024-02-14 MED ORDER — ONDANSETRON HCL 4 MG/2ML IJ SOLN
INTRAMUSCULAR | Status: AC
  Filled 2024-02-14: qty 2

## 2024-02-14 MED ORDER — ROCURONIUM BROMIDE 10 MG/ML (PF) SYRINGE
PREFILLED_SYRINGE | INTRAVENOUS | Status: DC | PRN
  Administered 2024-02-14: 60 mg via INTRAVENOUS

## 2024-02-14 MED ORDER — FENTANYL CITRATE (PF) 250 MCG/5ML IJ SOLN
INTRAMUSCULAR | Status: AC
  Filled 2024-02-14: qty 5

## 2024-02-14 MED ORDER — FENTANYL CITRATE (PF) 100 MCG/2ML IJ SOLN: 100.0000 ug | Freq: Once | INTRAMUSCULAR | Status: AC

## 2024-02-14 MED ORDER — ONDANSETRON HCL 4 MG/2ML IJ SOLN
INTRAMUSCULAR | Status: DC | PRN
  Administered 2024-02-14: 4 mg via INTRAVENOUS

## 2024-02-14 MED ORDER — FENTANYL CITRATE (PF) 100 MCG/2ML IJ SOLN
INTRAMUSCULAR | Status: AC
  Administered 2024-02-14: 100 ug via INTRAVENOUS
  Filled 2024-02-14: qty 2

## 2024-02-14 MED ORDER — SODIUM CHLORIDE 0.9% FLUSH: 3.0000 mL | INTRAVENOUS | Status: DC | PRN

## 2024-02-14 MED ORDER — ACETAMINOPHEN 650 MG RE SUPP: 650.0000 mg | RECTAL | Status: DC | PRN

## 2024-02-14 MED ORDER — ACETAMINOPHEN 325 MG PO TABS: 650.0000 mg | ORAL_TABLET | ORAL | Status: DC | PRN

## 2024-02-14 MED ORDER — OXYCODONE HCL 5 MG PO TABS: 5.0000 mg | ORAL_TABLET | ORAL | Status: DC | PRN

## 2024-02-14 MED ORDER — LIDOCAINE 2% (20 MG/ML) 5 ML SYRINGE
INTRAMUSCULAR | Status: AC
Start: 2024-02-14 — End: 2024-02-14
  Filled 2024-02-14: qty 5

## 2024-02-14 MED ORDER — CEFAZOLIN SODIUM-DEXTROSE 2-4 GM/100ML-% IV SOLN
2.0000 g | INTRAVENOUS | Status: AC
  Administered 2024-02-14: 2 g via INTRAVENOUS
  Filled 2024-02-14: qty 100

## 2024-02-14 MED ORDER — METOPROLOL SUCCINATE ER 25 MG PO TB24
50.0000 mg | ORAL_TABLET | Freq: Once | ORAL | Status: AC
  Administered 2024-02-14: 50 mg via ORAL
  Filled 2024-02-14: qty 2

## 2024-02-14 MED ORDER — SODIUM CHLORIDE 0.9% FLUSH: 3.0000 mL | Freq: Two times a day (BID) | INTRAVENOUS | Status: DC

## 2024-02-14 MED ORDER — AMISULPRIDE (ANTIEMETIC) 5 MG/2ML IV SOLN: 10.0000 mg | Freq: Once | INTRAVENOUS | Status: DC | PRN

## 2024-02-14 MED ORDER — BUPIVACAINE HCL (PF) 0.25 % IJ SOLN
INTRAMUSCULAR | Status: DC | PRN
  Administered 2024-02-14: 3 mL

## 2024-02-14 MED ORDER — PROPOFOL 10 MG/ML IV BOLUS
INTRAVENOUS | Status: DC | PRN
  Administered 2024-02-14: 200 mg via INTRAVENOUS

## 2024-02-14 MED ORDER — FENTANYL CITRATE (PF) 250 MCG/5ML IJ SOLN
INTRAMUSCULAR | Status: DC | PRN
  Administered 2024-02-14 (×3): 50 ug via INTRAVENOUS

## 2024-02-14 MED ORDER — BUPIVACAINE-EPINEPHRINE (PF) 0.25% -1:200000 IJ SOLN
INTRAMUSCULAR | Status: AC
  Filled 2024-02-14: qty 30

## 2024-02-14 MED ORDER — OXYCODONE HCL 5 MG PO TABS: 5.0000 mg | ORAL_TABLET | Freq: Once | ORAL | Status: DC | PRN

## 2024-02-14 MED ORDER — LACTATED RINGERS IV SOLN: INTRAVENOUS | Status: DC

## 2024-02-14 MED ORDER — CHLORHEXIDINE GLUCONATE CLOTH 2 % EX PADS: 6.0000 | MEDICATED_PAD | Freq: Once | CUTANEOUS | Status: DC

## 2024-02-14 MED ORDER — PROPOFOL 10 MG/ML IV BOLUS
INTRAVENOUS | Status: AC
  Filled 2024-02-14: qty 20

## 2024-02-14 MED ORDER — ROCURONIUM BROMIDE 10 MG/ML (PF) SYRINGE
PREFILLED_SYRINGE | INTRAVENOUS | Status: AC
  Filled 2024-02-14: qty 10

## 2024-02-14 MED ORDER — SODIUM CHLORIDE 0.9 % IV SOLN: 250.0000 mL | INTRAVENOUS | Status: DC | PRN

## 2024-02-14 MED ORDER — FENTANYL CITRATE (PF) 100 MCG/2ML IJ SOLN
25.0000 ug | INTRAMUSCULAR | Status: DC | PRN
  Administered 2024-02-14 (×2): 50 ug via INTRAVENOUS

## 2024-02-14 MED ORDER — ENSURE PRE-SURGERY PO LIQD: 296.0000 mL | Freq: Once | ORAL | Status: DC

## 2024-02-14 MED ORDER — 0.9 % SODIUM CHLORIDE (POUR BTL) OPTIME
TOPICAL | Status: DC | PRN
  Administered 2024-02-14: 1000 mL

## 2024-02-14 MED ORDER — MIDAZOLAM HCL 2 MG/2ML IJ SOLN
INTRAMUSCULAR | Status: AC
  Filled 2024-02-14: qty 2

## 2024-02-14 MED ORDER — LIDOCAINE 2% (20 MG/ML) 5 ML SYRINGE
INTRAMUSCULAR | Status: DC | PRN
  Administered 2024-02-14: 20 mg via INTRAVENOUS

## 2024-02-14 MED ORDER — ACETAMINOPHEN 500 MG PO TABS
1000.0000 mg | ORAL_TABLET | ORAL | Status: AC
Start: 2024-02-14 — End: 2024-02-14
  Administered 2024-02-14: 1000 mg via ORAL
  Filled 2024-02-14: qty 2

## 2024-02-14 MED ORDER — FENTANYL CITRATE (PF) 100 MCG/2ML IJ SOLN
INTRAMUSCULAR | Status: AC
  Filled 2024-02-14: qty 2

## 2024-02-14 MED ORDER — SUGAMMADEX SODIUM 200 MG/2ML IV SOLN
INTRAVENOUS | Status: DC | PRN
  Administered 2024-02-14: 200 mg via INTRAVENOUS

## 2024-02-14 MED ORDER — OXYCODONE HCL 5 MG/5ML PO SOLN: 5.0000 mg | Freq: Once | ORAL | Status: DC | PRN

## 2024-02-14 MED ORDER — OXYCODONE HCL 5 MG PO TABS: 5.0000 mg | ORAL_TABLET | Freq: Four times a day (QID) | ORAL | 0 refills | Status: DC | PRN

## 2024-02-14 MED ORDER — DEXAMETHASONE SOD PHOSPHATE PF 10 MG/ML IJ SOLN
INTRAMUSCULAR | Status: DC | PRN
  Administered 2024-02-14: 6 mg via INTRAVENOUS

## 2024-02-14 SURGICAL SUPPLY — 33 items
BAG COUNTER SPONGE SURGICOUNT (BAG) ×1 IMPLANT
BLADE CLIPPER SURG (BLADE) IMPLANT
CANISTER SUCTION 3000ML PPV (SUCTIONS) IMPLANT
CHLORAPREP W/TINT 26 (MISCELLANEOUS) ×1 IMPLANT
COVER SURGICAL LIGHT HANDLE (MISCELLANEOUS) ×1 IMPLANT
DERMABOND ADVANCED .7 DNX12 (GAUZE/BANDAGES/DRESSINGS) ×1 IMPLANT
DRAIN PENROSE .5X12 LATEX STL (DRAIN) IMPLANT
DRAPE LAPAROTOMY TRNSV 102X78 (DRAPES) ×1 IMPLANT
ELECT CAUTERY BLADE 6.4 (BLADE) ×1 IMPLANT
ELECTRODE REM PT RTRN 9FT ADLT (ELECTROSURGICAL) ×1 IMPLANT
GAUZE 4X4 16PLY ~~LOC~~+RFID DBL (SPONGE) ×1 IMPLANT
GLOVE BIO SURGEON STRL SZ7 (GLOVE) ×1 IMPLANT
GLOVE BIOGEL PI IND STRL 7.5 (GLOVE) ×1 IMPLANT
GOWN STRL REUS W/ TWL LRG LVL3 (GOWN DISPOSABLE) ×2 IMPLANT
KIT BASIN OR (CUSTOM PROCEDURE TRAY) ×1 IMPLANT
KIT TURNOVER KIT B (KITS) ×1 IMPLANT
MARKER SKIN DUAL TIP RULER LAB (MISCELLANEOUS) ×1 IMPLANT
MESH ULTRAPRO 3X6 7.6X15CM (Mesh General) IMPLANT
NDL HYPO 25GX1X1/2 BEV (NEEDLE) ×1 IMPLANT
NEEDLE HYPO 25GX1X1/2 BEV (NEEDLE) ×1 IMPLANT
PACK GENERAL/GYN (CUSTOM PROCEDURE TRAY) ×1 IMPLANT
PAD ARMBOARD POSITIONER FOAM (MISCELLANEOUS) ×1 IMPLANT
SOLN 0.9% NACL 1000 ML (IV SOLUTION) ×1 IMPLANT
SOLN 0.9% NACL POUR BTL 1000ML (IV SOLUTION) ×1 IMPLANT
STRIP CLOSURE SKIN 1/2X4 (GAUZE/BANDAGES/DRESSINGS) IMPLANT
SUT MNCRL AB 4-0 PS2 18 (SUTURE) ×1 IMPLANT
SUT VIC AB 2-0 CT1 TAPERPNT 27 (SUTURE) ×1 IMPLANT
SUT VIC AB 2-0 SH 18 (SUTURE) ×1 IMPLANT
SUT VIC AB 3-0 SH 27XBRD (SUTURE) ×1 IMPLANT
SUT VICRYL AB 2 0 TIES (SUTURE) ×1 IMPLANT
SYR CONTROL 10ML LL (SYRINGE) ×1 IMPLANT
TOWEL GREEN STERILE (TOWEL DISPOSABLE) ×1 IMPLANT
TOWEL GREEN STERILE FF (TOWEL DISPOSABLE) ×1 IMPLANT

## 2024-02-14 NOTE — Anesthesia Procedure Notes (Addendum)
 Anesthesia Regional Block: TAP block   Pre-Anesthetic Checklist: , timeout performed,  Correct Patient, Correct Site, Correct Laterality,  Correct Procedure, Correct Position, site marked,  Risks and benefits discussed,  Surgical consent,  Pre-op evaluation,  At surgeon's request and post-op pain management  Laterality: Right  Prep: chloraprep       Needles:  Injection technique: Single-shot  Needle Type: Echogenic Needle     Needle Length: 9cm  Needle Gauge: 21     Additional Needles:   Procedures:,,,, ultrasound used (permanent image in chart),,    Narrative:  Start time: 02/14/2024 8:15 AM End time: 02/14/2024 8:30 AM Injection made incrementally with aspirations every 5 mL.  Performed by: Personally  Anesthesiologist: Epifanio Charleston, MD

## 2024-02-14 NOTE — Transfer of Care (Signed)
 Immediate Anesthesia Transfer of Care Note  Patient: [name]  Procedure(s) Performed: REPAIR, HERNIA, INGUINAL, ADULT W/MESH (Right: Groin)  Patient Location: PACU  Anesthesia Type:General  Level of Consciousness: awake, alert , and oriented  Airway & Oxygen  Therapy: Patient Spontanous Breathing and Patient connected to face mask oxygen   Post-op Assessment: Report given to RN and Post -op Vital signs reviewed and stable  Post vital signs: Reviewed and stable  Last Vitals:  Vitals Value Taken Time  BP 134/81 02/14/24 10:24  Temp    Pulse 80 02/14/24 10:29  Resp 20 02/14/24 10:29  SpO2 88 % 02/14/24 10:29  Vitals shown include unfiled device data.  Last Pain:  Vitals:   02/14/24 0816  TempSrc:   PainSc: 0-No pain         Complications: No notable events documented.

## 2024-02-14 NOTE — Anesthesia Procedure Notes (Addendum)
 Procedure Name: Intubation Date/Time: 02/14/2024 9:19 AM  Performed by: Epifanio Charleston, MDPre-anesthesia Checklist: Patient identified, Emergency Drugs available, Suction available and Patient being monitored Patient Re-evaluated:Patient Re-evaluated prior to induction Oxygen  Delivery Method: Circle System Utilized Preoxygenation: Pre-oxygenation with 100% oxygen  Induction Type: IV induction Ventilation: Mask ventilation without difficulty and Oral airway inserted - appropriate to patient size Laryngoscope Size: Miller and 3 Grade View: Grade II Tube type: Oral Tube size: 7.5 mm Number of attempts: 2 Airway Equipment and Method: Stylet and Oral airway Placement Confirmation: ETT inserted through vocal cords under direct vision, positive ETCO2 and breath sounds checked- equal and bilateral Secured at: 23 cm Tube secured with: Tape Dental Injury: Teeth and Oropharynx as per pre-operative assessment  Comments: DLx1 by CRNA unable to visualize glottis 2/2 secretions. DL number 2 by myself R. Katleen Carraway with grade 2 view once secretions suctioned. +BBS, +ETCO2. Tube secured.

## 2024-02-14 NOTE — Op Note (Signed)
 Preoperative diagnosis: Right groin pain with possible hernia Postoperative diagnosis: Same as above Procedure: Right indirect inguinal hernia repair with UltraPro mesh patch Surgical Dr. Adina Bury Anesthesia: General With a tap block Estimated blood loss: Minimal Complications: None Drains: Specimens: None Sponge needle count was correct at completion Disposition recovery stable addition  Indications: This is a 74 year old male with multiple medical issues with a prior sleeve gastrectomy.  He has been having a month of right groin pain and swelling.  He has an ultrasound and a CT scan that are both read as negative but I believe that there was something abnormal on his CT scan.  We discussed due to his pain and this abnormality exploring his right groin.  Procedure: After informed consent was obtained he was taken to the operating room.  He underwent a tap block first.  He was given antibiotics.  SCDs were placed.  He was placed under general anesthesia without complication.  He was prepped and draped in a standard sterile surgical fashion.  Surgical timeout was then performed.  I made a right groin incision.  I carried this down and I ligated the superficial epigastric vein with 2-0 Vicryl ties.  I then identified the external oblique.  I incised this sharply through the superficial ring.  He did not have any masses or lymphadenopathy present upon the expiration.  He did have a indirect inguinal hernia that I was able to identify as well as a small cord lipoma.  I then was able to isolate this from the remaining cord structures which were preserved.  This was all wrapped in a Penrose drain.  I then reduced the indirect hernia sac in its entirety.  I tighten the internal ring with 2-0 Vicryl sutures.  I then placed a UltraPro mesh patch over the entire floor.  He did not have a true direct hernia but his floor was very weak.  I then sutured this in the position to the pubic tubercle with 2-0  Vicryl suture.  I then used 2-0 Vicryl suture to set attached this to the inguinal ligament every half centimeter.  I made a cut in the mesh and wrapped it around the spermatic cord.  I secured this superiorly as well with 2-0 Vicryl and closed down the T cut that had made in the mesh as well.  I laid the lateral portion flat under the external bleak.  This completely obliterated the defect and covered the floor adequately.  On a Valsalva this all appeared to be intact.  I then obtained hemostasis.  I then closed the external bleak with 2-0 Vicryl.  Scarpa's was closed with 3-0 Vicryl and the skin was closed with 4-0 Monocryl and glue.  He tolerated this well was extubated and transferred recovery stable.`

## 2024-02-14 NOTE — H&P (Signed)
 73 year old male with multiple medical problems. He has a prior laparoscopic sleeve gastrectomy by Dr. Gladis. He comes in to see me today for right groin pain that has been occurring for about 3 to 4 weeks. This is not really associated with anything including activity. He notes that it is in his right groin and radiates down to his inner thigh. This actually is occurring somewhat on his left now as well. He does note some swelling in his right groin. He also is complaining of some upper abdominal pain associated with this. He is up-to-date on his colonoscopy. He has been constipated for the past couple of months. He has no nausea vomiting associated with this. He does have an ultrasound of his right groin that was done with him supine that is negative I sent him for CT scan read as negative but I think there is abnormality in the right groin . I discussed this with radiology myself and both of us  agree there may be hydrocele or something there. He also has separate ab pain that I think is related to constipation.   Medical History: Past Medical History:  Diagnosis Date  Atrial fibrillation (CMS/HHS-HCC)  Carotid artery occlusion  Coronary artery disease  Fracture of lower end of tibia, unspecified laterality, open type III, initial encounter  High cholesterol  Hypertension  Motion sickness  Sleep apnea   Patient Active Problem List  Diagnosis  High cholesterol  Personal history of gout  Essential hypertension  BMI 45.0-49.9, adult (CMS-HCC)  Depression, major, in remission ()  Dyslipidemia  Erectile dysfunction  OSA on CPAP  Osteoarthritis  Seasonal allergies  Achalasia  Obstructive sleep apnea  Prostate cancer (CMS/HHS-HCC)  Persistent atrial fibrillation (CMS/HHS-HCC)  LBBB (left bundle branch block)  Hypertensive heart disease with heart failure (CMS/HHS-HCC)  Gout  Former smoker  S/P laparoscopic sleeve gastrectomy  Achalasia of esophagus   Past Surgical History:   Procedure Laterality Date  arthroscopic knee surgery 1997  JOINT REPLACEMENT Right 2022  hip  gastric sleeve 2022  EGD N/A 06/07/2022  Procedure: ESOPHAGOGASTRODUODENOSCOPY, FLEXIBLE, TRANSORAL; DIAGNOSTIC, INCLUDING COLLECTION OF SPECIMEN(S) BY BRUSHING OR WASHING, WHEN PERFORMED (SEPARATE PROCEDURE); Surgeon: Murlean Lang Fess, MD; Location: [facility] OPERATING ROOMS; Service: Cardiothoracic; Laterality: N/A;  colonoscopy  Kidney Stones Surgery  REPLACEMENT TOTAL KNEE BILATERAL  TONSILLECTOMY 1945   Allergies  Allergen Reactions  Erythromycin Other (See Comments)  Upset stomch   Current Outpatient Medications on File Prior to Visit  Medication Sig Dispense Refill  AMOXICILLIN  ORAL Take by mouth Dental premed-joint replacements  CALCIUM  CITRATE ORAL Take 500 mg by mouth 3 (three) times a day  diphenhydramine -acetaminophen  (TYLENOL  PM) 25-500 mg per tablet Take 2 tablets by mouth at bedtime  ENTRESTO  49-51 mg tablet Take 1 tablet by mouth 2 (two) times daily  geriatric multivitamin-min tablet Take 1 tablet by mouth 2 (two) times daily  ginkgo biloba 40 mg Tab Take by mouth.  METHOCARBAMOL  ORAL Take 750 mg by mouth once daily as needed  mometasone (NASONEX) 50 mcg/actuation nasal spray by Nasal route.  mv-mn/C/glutamin/lysin/herb124 (AIRBORNE, ASCORBATE SODIUM, ORAL) Take by mouth  REPATHA  SYRINGE 140 mg/mL Syrg Inject 140 mg subcutaneously every 14 (fourteen) days  rivaroxaban  (XARELTO ) 20 mg tablet Take by mouth.  rosuvastatin  (CRESTOR ) 40 MG tablet Take 40 mg by mouth once daily  sertraline  (ZOLOFT ) 50 MG tablet Take 50 mg by mouth at bedtime  spironolactone  (ALDACTONE ) 25 MG tablet Take 12.5 mg by mouth once daily  thiamine (VITAMIN B-1) 100 MG tablet Take  by mouth  WEGOVY  1.7 mg/0.75 mL pen injector Inject 2.4 mg subcutaneously once a week  BIOTIN ORAL Take 2,500 mcg by mouth once daily  latanoprost  (XALATAN ) 0.005 % ophthalmic solution Place 1 drop into both eyes at bedtime for  90 days 7.5 mL 4  rosuvastatin  (CRESTOR ) 20 MG tablet Take 20 mg by mouth once daily (Patient not taking: Reported on 01/31/2024)   Family History  Problem Relation Age of Onset  Alzheimer's disease Mother  Myocardial Infarction (Heart attack) Mother  Myocardial Infarction (Heart attack) Father  Glaucoma Father  High blood pressure (Hypertension) Father  Throat cancer Father  Macular degeneration Neg Hx  Anesthesia problems Neg Hx    Social History   Tobacco Use  Smoking Status Former  Current packs/day: 0.00  Average packs/day: 1.5 packs/day for 35.0 years (52.5 ttl pk-yrs)  Types: Cigarettes  Start date: 83  Quit date: 2002  Years since quitting: 23.7  Smokeless Tobacco Never  Tobacco Comments  05/04/22: Smoked off and on for 35 years. 1 - 1.5ppd  Marital status: Married  Tobacco Use  Smoking status: Former  Current packs/day: 0.00  Average packs/day: 1.5 packs/day for 35.0 years (52.5 ttl pk-yrs)  Types: Cigarettes  Start date: 41  Quit date: 2002   Objective:   Physical Exam   General nad Cv regular Pulm effort normal Right groin with swelling, tender, there is mass present but not sure if this is a hernia No LIH  Assessment and Plan:   ? RIH, hydrocele  I do think there is something in this groin possible hernia or cyst? I think best plan would be to explore his groin anteriorly where I can feel this area. Will do it this week. Discussed excising mass if needed vs hernia repair.  I think reasonable given his degree of pain and symptoms I described the procedure in detail. The patient was given educational material. Goals should be achieved with surgery. We discussed the usage of mesh and the rationale behind that. We went over the pathophysiology of inguinal hernia. We have elected to perform open inguinal hernia repair with mesh. We discussed the risks including bleeding, infection, recurrence, postoperative pain and chronic groin pain, testicular injury,  urinary retention, numbness in groin and around incision.

## 2024-02-14 NOTE — Discharge Instructions (Signed)
 [facility]  UMBILICAL OR INGUINAL HERNIA REPAIR: POST OP INSTRUCTIONS  Always review your discharge instruction sheet given to you by the facility where your surgery was performed. IF YOU HAVE DISABILITY OR FAMILY LEAVE FORMS, YOU MUST BRING THEM TO THE OFFICE FOR PROCESSING.   DO NOT GIVE THEM TO YOUR DOCTOR.  A  prescription for pain medication may be given to you upon discharge.  Take your pain medication as prescribed, if needed.  If narcotic pain medicine is not needed, then you may take acetaminophen (Tylenol), naprosyn (Alleve) or ibuprofen (Advil) as needed. Take your usually prescribed medications unless otherwise directed. If you need a refill on your pain medication, please contact your pharmacy.  They will contact our office to request authorization. Prescriptions will not be filled after 5 pm or on week-ends. You should follow a light diet the first 24 hours after arrival home, such as soup and crackers, etc.  Be sure to include lots of fluids daily.  Resume your normal diet the day after surgery. Most patients will experience some swelling and bruising around the umbilicus or in the groin and scrotum.  Ice packs and reclining will help.  Swelling and bruising can take several days to resolve.  It is common to experience some constipation if taking pain medication after surgery.  Increasing fluid intake and taking a stool softener (such as Colace) will usually help or prevent this problem from occurring.  A mild laxative (Milk of Magnesia or Miralax) should be taken according to package directions if there are no bowel movements after 48 hours. Unless discharge instructions indicate otherwise, you may remove your bandages 48 hours after surgery, and you may shower at that time.  You may have steri-strips (small skin tapes) in place directly over the incision.  These strips should be left on the skin for 7-10 days and will come off on their own.  If your surgeon used  skin glue on the incision, you may shower in 24 hours.  The glue will flake off over the next 2-3 weeks.  Any sutures or staples will be removed at the office during your follow-up visit. ACTIVITIES:  You may resume regular (light) daily activities beginning the next day--such as daily self-care, walking, climbing stairs--gradually increasing activities as tolerated.  You may have sexual intercourse when it is comfortable.  Refrain from any heavy lifting or straining until approved by your doctor. You may drive when you are no longer taking prescription pain medication, you can comfortably wear a seatbelt, and you can safely maneuver your car and apply brakes. RETURN TO WORK:  __________________________________________________________ Isaiah Jones should see your doctor in the office for a follow-up appointment approximately 2-3 weeks after your surgery.  Make sure that you call for this appointment within a day or two after you arrive home to insure a convenient appointment time. OTHER INSTRUCTIONS:  __________________________________________________________________________________________________________________________________________________________________________________________  WHEN TO CALL YOUR DOCTOR: Fever over 101.0 Inability to urinate Nausea and/or vomiting Extreme swelling or bruising Continued bleeding from incision. Increased pain, redness, or drainage from the incision  The clinic staff is available to answer your questions during regular business hours.  Please don't hesitate to call and ask to speak to one of the nurses for clinical concerns.  If you have a medical emergency, go to the nearest emergency room or call 911.  A surgeon from [facility] is always on call at the hospital   [address], [address], [place], [place]  [address] ?  [address] [address], [place], [place]   [address] [phone] ? [phone] ? FAX [phone] Web site:  [email or web address]

## 2024-02-14 NOTE — Anesthesia Postprocedure Evaluation (Signed)
 Anesthesia Post Note  Patient: Isaiah Jones  Procedure(s) Performed: REPAIR, HERNIA, INGUINAL, ADULT W/MESH (Right: Groin)     Patient location during evaluation: PACU Anesthesia Type: General Level of consciousness: awake and alert Pain management: pain level controlled Vital Signs Assessment: post-procedure vital signs reviewed and stable Respiratory status: spontaneous breathing, nonlabored ventilation, respiratory function stable and patient connected to nasal cannula oxygen  Cardiovascular status: blood pressure returned to baseline and stable Postop Assessment: no apparent nausea or vomiting Anesthetic complications: no   No notable events documented.  Last Vitals:  Vitals:   02/14/24 1100 02/14/24 1115  BP: 107/69 106/70  Pulse: 80 80  Resp: 16 13  Temp:  (!) 36.1 C  SpO2: 94% 98%    Last Pain:  Vitals:   02/14/24 1100  TempSrc:   PainSc: 2                  Epifanio Lamar BRAVO

## 2024-02-15 ENCOUNTER — Encounter (HOSPITAL_COMMUNITY): Payer: Self-pay | Admitting: General Surgery

## 2024-02-15 ENCOUNTER — Encounter (HOSPITAL_COMMUNITY): Payer: Self-pay | Admitting: *Deleted

## 2024-02-15 ENCOUNTER — Ambulatory Visit: Admitting: Internal Medicine

## 2024-03-04 ENCOUNTER — Ambulatory Visit: Admitting: Internal Medicine

## 2024-03-04 ENCOUNTER — Encounter: Payer: Self-pay | Admitting: Internal Medicine

## 2024-03-04 VITALS — BP 112/76 | HR 70 | Temp 98.0°F | Ht 74.0 in | Wt 238.6 lb

## 2024-03-04 DIAGNOSIS — M109 Gout, unspecified: Secondary | ICD-10-CM

## 2024-03-04 DIAGNOSIS — D7589 Other specified diseases of blood and blood-forming organs: Secondary | ICD-10-CM | POA: Diagnosis not present

## 2024-03-04 DIAGNOSIS — E559 Vitamin D deficiency, unspecified: Secondary | ICD-10-CM

## 2024-03-04 DIAGNOSIS — I5022 Chronic systolic (congestive) heart failure: Secondary | ICD-10-CM | POA: Diagnosis not present

## 2024-03-04 DIAGNOSIS — N200 Calculus of kidney: Secondary | ICD-10-CM

## 2024-03-04 DIAGNOSIS — I4819 Other persistent atrial fibrillation: Secondary | ICD-10-CM | POA: Diagnosis not present

## 2024-03-04 DIAGNOSIS — D801 Nonfamilial hypogammaglobulinemia: Secondary | ICD-10-CM

## 2024-03-04 DIAGNOSIS — M10072 Idiopathic gout, left ankle and foot: Secondary | ICD-10-CM

## 2024-03-04 DIAGNOSIS — K5904 Chronic idiopathic constipation: Secondary | ICD-10-CM | POA: Insufficient documentation

## 2024-03-04 LAB — LIPID PANEL
Cholesterol: 93 mg/dL (ref 0–200)
HDL: 55.3 mg/dL (ref >39.00)
LDL Cholesterol: 19 mg/dL (ref 0–99)
NonHDL: 37.57
Total CHOL/HDL Ratio: 2
Triglycerides: 95 mg/dL (ref 0.0–149.0)
VLDL: 19 mg/dL (ref 0.0–40.0)

## 2024-03-04 LAB — MAGNESIUM: Magnesium: 1.9 mg/dL (ref 1.5–2.5)

## 2024-03-04 LAB — URIC ACID: Uric Acid, Serum: 2.9 mg/dL — ABNORMAL LOW (ref 4.0–7.8)

## 2024-03-04 NOTE — Assessment & Plan Note (Signed)
 Macrocytosis (large red blood cells)   Macrocytosis has been present for about a year. Differential diagnosis includes vitamin B12 deficiency, thyroid  problems, and cancer, with alcohol use potentially contributing by affecting bone marrow function. There is no current evidence of thyroid  enlargement or cancer. Blood tests for vitamin B12, folate, methylmalonic acid, and thyroid  function have been ordered. He is advised to continue abstaining from alcohol.

## 2024-03-04 NOTE — Assessment & Plan Note (Signed)
 Chronic idiopathic constipation   Constipation has persisted for several months, possibly exacerbated by high-dose Wegovy  and related to reduced food and fluid intake. Miralax is safe for daily use to prevent straining, especially post-hernia repair. He is advised to use Miralax daily, 17 grams per day, to achieve watery stools and prevent straining. Increased fluid intake and dietary modifications, including extra virgin olive oil and roasted cauliflower for fiber, are encouraged.

## 2024-03-04 NOTE — Patient Instructions (Signed)
 It was a pleasure seeing you today! Your health and satisfaction are our top priorities.  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 We discussed your current symptoms, including the swelling at the surgical site, your chronic constipation, and your history of gout. We also reviewed your ongoing management for heart issues, fatty liver, peripheral artery disease, kidney stones, and hypogammaglobulinemia.  YOUR PLAN: -MACROCYTOSIS (LARGE RED BLOOD CELLS): Macrocytosis means you have larger than normal red blood cells. This can be caused by vitamin B12 deficiency, thyroid  problems, or alcohol use. We have ordered blood tests to check your vitamin B12, folate, methylmalonic acid, and thyroid  function. Please continue to avoid alcohol.  -CHRONIC IDIOPATHIC CONSTIPATION: Chronic idiopathic constipation means you have ongoing constipation without a known cause. It may be worsened by your high-dose Wegovy . Continue using Miralax daily (17 grams per day) to achieve watery stools and prevent straining. Increase your fluid intake and try adding extra virgin olive oil and roasted cauliflower to your diet for more fiber.  -IDIOPATHIC GOUT, LEFT ANKLE AND FOOT: Gout is a type of arthritis caused by high levels of uric acid in the blood, leading to joint pain. Your weight loss with Wegovy  may have reduced your risk. We have ordered a uric acid level test. Please continue to avoid shellfish and high-purine foods.  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 Continue taking amiodarone  as prescribed and follow up with your cardiologist for ongoing management.  -FATTY LIVER: Fatty liver means you have extra fat in your liver. Continue using Wegovy  for weight management, which helps with this  condition. We have ordered a lipid panel to monitor your cholesterol levels.  -PERIPHERAL ARTERY DISEASE: Peripheral artery disease is a condition where the blood vessels outside your heart narrow, reducing blood flow. Continue using Wegovy  as it helps manage this condition.  -CALCULUS OF KIDNEY: This means you have kidney stones. Please continue your follow-up with the urologist as scheduled.  -NONFAMILIAL HYPOGAMMAGLOBULINEMIA: This is a condition where you have low levels of antibodies, which can affect your immune system. We will continue to coordinate with specialists for your management.  -GENERAL HEALTH MAINTENANCE: For your general health, continue with dietary recommendations like using extra virgin olive oil and eating roasted cauliflower. Engage in resistance exercises such as squats and weighted vest squats.  INSTRUCTIONS: Please follow up with the urologist as scheduled later this month. Continue to avoid alcohol and shellfish. We will contact you with the results of your blood tests and uric acid level test.  Your Providers PCP: Isaiah Isaiah MATSU, MD,  [phone]) Referring Provider: [name], MD,  [phone]) Care Team Provider: Dann Candyce RAMAN, MD,  [phone]) Care Team Provider: Carolee Sherwood JONETTA DOUGLAS, MD,  [phone]) Care Team Provider: Tobie Erm, MD,  [phone]) Care Team Provider: Eluterio Cramp, MD,  [phone]) Care Team Provider: Sheril Coy, MD,  [phone]) Care Team Provider: [name]  [phone]) Care Team Provider: Cesario Boer, MD,  [phone]) Care Team Provider: Barbaraann Darryle Ned, MD,  [phone]) Care Team Provider: Inocencio Soyla Lunger, MD,  [phone]) Care Team Provider: Federico Rosario BROCKS, MD,  [phone])  NEXT STEPS: [x]  Early Intervention: Schedule sooner appointment, call our on-call services, or go to emergency room if there is any significant Increase in pain or discomfort  New or  worsening symptoms Sudden or severe changes in your health [x]  Flexible Follow-Up: We recommend a No follow-ups on file. for optimal routine care. This allows for progress monitoring and treatment adjustments. [x]  Preventive Care: Schedule your annual preventive care visit! It's typically covered by insurance and helps identify potential health issues early. [x]  Lab & X-ray Appointments: Incomplete tests scheduled today, or call to schedule. X-rays: [facility] (M-F, 8:30am-noon or 1pm-5pm). [x]  Medical Information Release: Sign a release form at front desk to obtain relevant medical information we don't have.  MAKING THE MOST OF OUR FOCUSED 20 MINUTE APPOINTMENTS: [x]   Clearly state your top concerns at the beginning of the visit to focus our discussion [x]   If you anticipate you will need more time, please inform the front desk during scheduling - we can book multiple appointments in the same week. [x]   If you have transportation problems- use our convenient video appointments or ask about transportation support. [x]   We can get down to business faster if you use MyChart to update information before the visit and submit non-urgent questions before your visit. Thank you for taking the time to provide details through MyChart.  Let our nurse know and she can import this information into your encounter documents.  Arrival and Wait Times: [x]   Arriving on time ensures that everyone receives prompt attention. [x]   Early morning (8a) and afternoon (1p) appointments tend to have shortest wait times. [x]   Unfortunately, we cannot delay appointments for late arrivals or hold slots during phone calls.  Getting Answers and Following Up [x]   Simple Questions & Concerns: For quick questions or basic follow-up after your visit, reach us  at [phone] or MyChart messaging. [x]   Complex Concerns: If your concern is more complex, scheduling an appointment might be best. Discuss this with the  staff to find the most suitable option. [x]   Lab & Imaging Results: We'll contact you directly if results are abnormal or you don't use MyChart. Most normal results will be on MyChart within 2-3 business days, with a review message from Dr. Jesus. Haven't heard back in 2 weeks? Need results sooner? Contact us  at [phone]. [x]   Referrals: Our referral coordinator will manage specialist referrals. The specialist's office should contact you within 2 weeks to schedule an appointment. Call us  if you haven't heard from them after 2 weeks.  Staying Connected [x]   MyChart: Activate your MyChart for the fastest way to access results and message us . See the last page of this paperwork for instructions on how to activate.  Bring to Your Next Appointment [x]   Medications: Please bring all your medication bottles to your next appointment to ensure we have an accurate record of your prescriptions. [x]   Health Diaries: If you're monitoring any health conditions at home, keeping a diary of your readings can be very helpful for discussions at your next appointment.  Billing [x]   X-ray & Lab Orders: These are billed by separate companies. Contact the invoicing company directly for questions or concerns. [x]   Visit Charges: Discuss any billing inquiries with our administrative services team.  Your Satisfaction Matters [x]   Share Your Experience: We strive for your satisfaction! If you have any complaints, or preferably compliments, please let Dr. Jesus know directly or contact our Practice Administrators, Manuelita Rubin or [employer], by asking at the front desk.   Reviewing Your Records [x]   Review this early draft of your clinical encounter notes below and the final encounter summary  tomorrow on MyChart after its been completed.  All orders placed so far are visible here: Macrocytosis -     CBC with Differential/Platelet -     Comprehensive metabolic panel with GFR -     TSH + free T4 -     B12  and Folate Panel -     Methylmalonic acid, serum -     Pathologist smear review -     Reticulocytes  Chronic idiopathic constipation  Vitamin D  deficiency -     VITAMIN D  25 Hydroxy (Vit-D Deficiency, Fractures)  Idiopathic gout of left foot, unspecified chronicity  Atrial fibrillation, persistent -     Magnesium -     IgG, IgA, IgM  Chronic HFrEF (heart failure with reduced ejection fraction) (HCC) -     Magnesium -     IgG, IgA, IgM -     Lipid panel  Calcium  oxalate kidney stones -     Microalbumin / creatinine urine ratio; Future -     Urinalysis w microscopic + reflex cultur  Hypogammaglobulinemia -     IgG, IgA, IgM  Gout of hand, unspecified cause, unspecified chronicity, unspecified laterality -     Uric acid

## 2024-03-04 NOTE — Assessment & Plan Note (Signed)
 Suspicious for amiodarone  induced Discussed possible need to change it- will recheck severity.

## 2024-03-04 NOTE — Assessment & Plan Note (Signed)
 Idiopathic gout, left ankle and foot   Gout symptoms last occurred in the finger. Weight loss with Wegovy  may have reduced gout risk. Uric acid levels will be monitored to assess current risk. A uric acid level test has been ordered, and he is advised to avoid shellfish and high-purine foods.

## 2024-03-04 NOTE — Assessment & Plan Note (Signed)
 Check mg and TSH and coordinate with Dr. Caretha

## 2024-03-04 NOTE — Assessment & Plan Note (Signed)
 Chronic systolic heart failure with persistent atrial fibrillation   This chronic condition is managed with amiodarone , which may impact thyroid  function and IgG deficiency. Coordination with a cardiologist for ongoing management will continue. He should continue taking amiodarone .

## 2024-03-04 NOTE — Assessment & Plan Note (Signed)
 follow-up with a urologist is scheduled. He should continue follow-up with the urologist. Shared decision-making done; patient understood rationale and agreed to [facility]

## 2024-03-04 NOTE — Assessment & Plan Note (Signed)
 Shared decision-making done; patient understood rationale and agreed to [facility]

## 2024-03-04 NOTE — Progress Notes (Signed)
 ==============================  [facility]: [phone]   -- Medical Office Visit --  Patient: Isaiah Jones      Age: 73 y.o.       Sex:  male  Date:   03/04/2024 Today's Healthcare Provider: Bernardino KANDICE Cone, MD  ==============================   Chief Complaint: Labs Only Chronic disease monitoring;   brings up chronic constipation problematic with recent hernia repair and Dr. Inocencio would like LFT/TSH/free T4   Discussed the use of AI scribe software for clinical note transcription with the patient, who gave verbal consent to proceed.  History of Present Illness  73 year old male who presents for follow-up after hernia surgery and evaluation of macrocytic anemia.  He underwent hernia surgery two weeks ago and experiences persistent swelling at the surgical site. He describes a sensation of a 'little knot' at the site but denies pain or growth. He applies ice once a day and notes significant bruising post-surgery. He recalls receiving a nerve block and general anesthesia during the procedure, which he found painful.  He has been taking oral vitamin B12 daily. He denies significant alcohol consumption recently but admits to drinking more earlier in the year, which he suspects may have contributed to the anemia.  He experiences chronic constipation and is currently taking the highest dose of Wegovy . He uses Miralax daily, mixed with coffee, to manage his symptoms. Constipation predates his hernia surgery. He is on the highest dose of Wegovy . He drinks a lot of water  and has tried dietary changes, including eating salads, without relief.  He has a history of gout, with the last episode affecting his finger. He has been losing weight with Wegovy , which he believes has helped manage his gout symptoms. He avoids shellfish to prevent gout flare-ups.  He is currently taking amiodarone  for heart issues. He has a history of making tiny calcium  stones in  his urine and is scheduled to see a urologist later in the month.   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); Anticoagulant long-term use; Achalasia of esophagus; Chronic HFrEF (heart failure with reduced ejection fraction) (HCC); Presbycusis; Laryngopharyngeal reflux (LPR); Insomnia with sleep apnea; History of arthritis; Foot pain, right; Hematuria; Allergic rhinitis; Weight disorder; Hypertension; At risk for falls; CAD (coronary artery disease); Calcium  oxalate kidney stones; Chronic kidney disease, stage 2 (mild); Structural heart disease; Abnormal EKG; Alcohol use; Nonischemic cardiomyopathy (HCC); Hypogammaglobulinemia; Presence of heart assist device (HCC); Macrocytosis; and Chronic idiopathic constipation on their problem list. Past Medical History:  has a past medical history of AICD (automatic cardioverter/defibrillator) present, Anxiety, Arthritis, degenerative, Benign hypertensive heart disease without congestive heart failure, BMI 45.0-49.9, adult (HCC) (03/09/2018), CHF (congestive heart failure) (HCC), Chronic kidney disease, Colon polyps, Complication of anesthesia, Depression, Dyslipidemia, Dysrhythmia (2017), GERD (gastroesophageal reflux disease), Glaucoma, History of 2019 novel coronavirus disease (COVID-19) (06/2019), History of kidney stones, Hypertension, Hypertriglyceridemia (03/14/2022), OSA on CPAP, Persistent atrial fibrillation (HCC), Personal history of gout (05/23/2019), Pneumonia, PONV (postoperative nausea  and vomiting), and Rhinitis, allergic. Past Surgical History:   has a past surgical history that includes Replacement total knee (Bilateral, 2008); Vasectomy; Tonsillectomy; arthroscopic knee surgery (Bilateral, 2003); Cardioversion (N/A, 08/10/2016); Laparoscopic gastric  sleeve resection (N/A, 07/15/2019); Colonoscopy; Total hip arthroplasty (Right, 10/21/2019); Cystoscopy with retrograde pyelogram, ureteroscopy and stent placement (Left, 03/04/2021); Holmium laser application (Left, 03/04/2021); Esophageal manometry (N/A, 03/22/2022); RIGHT/LEFT HEART CATH AND CORONARY ANGIOGRAPHY (N/A, 09/22/2022); CORONARY PRESSURE/FFR STUDY (N/A, 09/22/2022); Cystoscopy with retrograde pyelogram, ureteroscopy and stent placement (Right, 01/12/2023); BIV ICD INSERTION CRT-D (N/A, 03/16/2023); Implantable cardioverter defibrillator generator change (03/16/2023); CARDIOVERSION (N/A, 05/17/2023); and Inguinal hernia repair (Right, 02/14/2024). Social History:   reports that he quit smoking about 22 years ago. His smoking use included cigarettes. He has been exposed to tobacco smoke. He has never used smokeless tobacco. He reports current alcohol use. He reports that he does not use drugs. Family History:  family history includes Alzheimer's disease in his mother; Arthritis in his father and mother; Esophageal cancer in his father; Heart attack in his mother; Heart attack (age of onset: 42) in his father; Hyperlipidemia in his brother, father, and mother; Hypertension in his brother, father, and mother; Other in his brother. Allergies:  is allergic to erythromycin.   Medication Reconciliation: Current Outpatient Medications on File Prior to Visit  Medication Sig   acetaminophen  (TYLENOL ) 500 MG tablet Take 1,000 mg by mouth every 6 (six) hours as needed for headache, moderate pain (pain score 4-6) or mild pain (pain score 1-3).   allopurinol  (ZYLOPRIM ) 300 MG tablet TAKE 1 TABLET DAILY   amiodarone  (PACERONE ) 200 MG  tablet Take 1 tablet (200 mg total) by mouth daily.   calcium  citrate (CALCITRATE - DOSED IN MG ELEMENTAL CALCIUM ) 950 (200 Ca) MG tablet Take 200 mg of elemental calcium  by mouth 3 (three) times daily.   Cholecalciferol (VITAMIN D3) 125 MCG (5000 UT) TABS Take 5,000 Units by mouth daily.   Cyanocobalamin  (VITAMIN B-12) 5000 MCG SUBL Place 5,000 mcg under the tongue daily.   diphenhydramine -acetaminophen  (TYLENOL  PM) 25-500 MG TABS tablet Take 2 tablets by mouth at bedtime.    empagliflozin  (JARDIANCE ) 10 MG TABS tablet Take 1 tablet (10 mg total) by mouth daily before breakfast.   fexofenadine (ALLEGRA) 180 MG tablet Take 180 mg by mouth daily as needed for allergies or rhinitis.   Ginkgo 60 MG TABS Take 60 mg by mouth daily.   ipratropium-albuterol  (DUONEB) 0.5-2.5 (3) MG/3ML SOLN Take 3 mLs by nebulization every 4 (four) hours as needed.   latanoprost  (XALATAN ) 0.005 % ophthalmic solution Place 1 drop into both eyes at bedtime.   metoprolol  succinate (TOPROL -XL) 50 MG 24 hr tablet Take 1 tablet (50 mg total) by mouth daily. Take with or immediately following a meal.   mometasone (NASONEX) 50 MCG/ACT nasal spray Place 1 spray into the nose at bedtime.   Multiple Vitamin (MULTIVITAMIN WITH MINERALS) TABS tablet Take 1 tablet by mouth 2 (two) times daily. Bariatric Multivitamin   Multiple Vitamins-Minerals (AIRBORNE PO) Take 1 tablet by mouth daily as needed (immune health).    nystatin  (MYCOSTATIN /NYSTOP ) powder Apply 1 Application topically daily as needed (Rash).   REPATHA  SURECLICK 140 MG/ML SOAJ INJECT CONTENTS OF 1 PEN SUBCUTANEOUSLY EVERY 14 DAYS   rosuvastatin  (CRESTOR ) 40 MG tablet TAKE 1 TABLET DAILY   sacubitril-valsartan  (ENTRESTO ) 49-51 MG Take 1 tablet by mouth 2 (two) times daily.   Semaglutide -Weight Management (WEGOVY ) 2.4 MG/0.75ML SOAJ Inject 2.4 mg into the skin once a week.   sertraline  (ZOLOFT ) 50 MG tablet TAKE 1 TABLET DAILY   spironolactone  (ALDACTONE ) 25 MG tablet TAKE  1/2 TABLET(12.5MG      TOTAL) DAILY   allopurinol  (ZYLOPRIM ) 100 MG tablet TAKE 1  TABLET DAILY . DO NOT START UNTIL STEROIDS HAVED CALMED DOWN THE ATTACK, BUT DO NOT STOP IF AN ATTACK RETURNS   colchicine  0.6 MG tablet Take 1 tablet (0.6 mg total) by mouth daily. (Patient taking differently: Take 0.6 mg by mouth daily as needed (gout flares).)   methocarbamol  (ROBAXIN -750) 750 MG tablet Take 1 tablet (750 mg total) by mouth 4 (four) times daily. (Patient taking differently: Take 750 mg by mouth daily as needed for muscle spasms (Back).)   oxyCODONE  (OXY IR/ROXICODONE ) 5 MG immediate release tablet Take 1 tablet (5 mg total) by mouth every 6 (six) hours as needed.   thiamine (VITAMIN B-1) 100 MG tablet Take 100 mg by mouth daily.   XARELTO  20 MG TABS tablet TAKE 1 TABLET DAILY WITH   SUPPER   No current facility-administered medications on file prior to visit.  There are no discontinued medications.   Physical Exam:    03/04/2024    8:05 AM 02/14/2024   11:15 AM 02/14/2024   11:00 AM  Vitals with BMI  Height 6' 2    Weight 238 lbs 10 oz    BMI 30.62    Systolic 112 106 892  Diastolic 76 70 69  Pulse 70 80 80  Vital signs reviewed.  Nursing notes reviewed. Weight trend reviewed. Physical Activity: Inactive (03/03/2024)   Exercise Vital Sign    Days of Exercise per Week: 0 days    Minutes of Exercise per Session: Not on file   General Appearance:  No acute distress appreciable.   Well-groomed, healthy-appearing male.  Well proportioned with no abnormal fat distribution.  Good muscle tone. Pulmonary:  Normal work of breathing at rest, no respiratory distress apparent. SpO2: 98 %  Musculoskeletal: All extremities are intact.  Neurological:  Awake, alert, oriented, and engaged.  No obvious focal neurological deficits or cognitive impairments.  Sensorium seems unclouded.   Speech is clear and coherent with logical content. Psychiatric:  Appropriate mood, pleasant and cooperative demeanor,  thoughtful and engaged during the exam  Verbalized to patient: Physical Exam NECK: Thyroid  without palpable nodules.  Results LABS   CBC: Macrocytosis (02/14/2024)     01/07/2024   11:33 AM 08/30/2023    8:26 AM 03/27/2023    8:26 AM 01/10/2023   10:56 AM  PHQ 2/9 Scores  PHQ - 2 Score 0 0 0 3  PHQ- 9 Score 1 1 0 6   Lab Results  Component Value Date   MCV 102.3 (H) 02/14/2024   MCV 102.1 (H) 08/30/2023   MCV 101 (H) 05/03/2023   MCV 102.0 (H) 03/27/2023   MCV 103 (H) 03/07/2023   MCV 98.8 01/05/2023   MCV 98.8 12/06/2022   MCV 99 (H) 09/18/2022   MCV 96.7 03/13/2022   MCV 95.2 06/14/2021   MCV 100.0 02/22/2021   MCV 95.4 06/11/2020   MCV 97.5 10/17/2019   MCV 93.7 07/16/2019   MCV 96.4 07/15/2019   MCV 95.6 07/07/2019   MCV 93 08/08/2016   MCV 93.0 01/22/2007   MCV 93.4 01/21/2007   MCV 94.6 01/20/2007   MCV 95.1 01/19/2007   MCV 94.9 01/18/2007   MCV 94.2 01/17/2007   MCV 93.4 01/16/2007   MCV 93.5 01/11/2007    Admission on 02/14/2024, Discharged on 02/14/2024  Component Date Value Ref Range Status   Sodium 02/14/2024 139  135 - 145 mmol/L Final   Potassium 02/14/2024 4.1  3.5 - 5.1 mmol/L Final   Chloride 02/14/2024 101  98 - 111  mmol/L Final   CO2 02/14/2024 29  22 - 32 mmol/L Final   Glucose, Bld 02/14/2024 83  70 - 99 mg/dL Final   BUN 89/83/7974 21  8 - 23 mg/dL Final   Creatinine, Ser 02/14/2024 1.03  0.61 - 1.24 mg/dL Final   Calcium  02/14/2024 9.2  8.9 - 10.3 mg/dL Final   GFR, Estimated 02/14/2024 >60  >60 mL/min Final   Anion gap 02/14/2024 9  5 - 15 Final   WBC 02/14/2024 5.6  4.0 - 10.5 K/uL Final   RBC 02/14/2024 4.42  4.22 - 5.81 MIL/uL Final   Hemoglobin 02/14/2024 14.6  13.0 - 17.0 g/dL Final   HCT 89/83/7974 45.2  39.0 - 52.0 % Final   MCV 02/14/2024 102.3 (H)  80.0 - 100.0 fL Final   MCH 02/14/2024 33.0  26.0 - 34.0 pg Final   MCHC 02/14/2024 32.3  30.0 - 36.0 g/dL Final   RDW 89/83/7974 13.8  11.5 - 15.5 % Final   Platelets  02/14/2024 165  150 - 400 K/uL Final   nRBC 02/14/2024 0.0  0.0 - 0.2 % Final  Office Visit on 01/22/2024  Component Date Value Ref Range Status   Pulse Generator Manufacturer 01/22/2024 SJCR   Final   Date Time Interrogation Session 01/22/2024 [phone]   Final   Pulse Gen Model 01/22/2024 [id number] Gallant HF   Final   Pulse Gen Serial Number 01/22/2024 [id number]   Final   Clinic Name 01/22/2024 [facility]   Final   Implantable Pulse Generator Type 01/22/2024 Cardiac Resynch Therapy Defibulator   Final   Implantable Pulse Generator Implan* 01/22/2024 [id number]   Final   Implantable Lead Manufacturer 01/22/2024 [facility]   Final   Implantable Lead Model 01/22/2024 1456Q Quartet   Final   Implantable Lead Serial Number 01/22/2024 [id number]   Final   Implantable Lead Implant Date 01/22/2024 [id number]   Final   Implantable Lead Location Detail 1 01/22/2024 UNKNOWN   Final   Implantable Lead Location 01/22/2024 246141   Final   Implantable Lead Connection Status 01/22/2024 246014   Final   Implantable Lead Manufacturer 01/22/2024 [facility]   Final   Implantable Lead Model 01/22/2024 7122Q Durata SJ4   Final   Implantable Lead Serial Number 01/22/2024 [id number]   Final   Implantable Lead Implant Date 01/22/2024 [id number]   Final   Implantable Lead Location Detail 1 01/22/2024 UNKNOWN   Final   Implantable Lead Location 01/22/2024 246139   Final   Implantable Lead Connection Status 01/22/2024 246014   Final   Implantable Lead Manufacturer 01/22/2024 OTHER   Final   Implantable Lead Model 01/22/2024 OEJ8768/41 ULTIPACE   Final   Implantable Lead Serial Number 01/22/2024 [id number]   Final   Implantable Lead Implant Date 01/22/2024 [id number]   Final   Implantable Lead Location Detail 1 01/22/2024 UNKNOWN   Final   Implantable Lead Location 01/22/2024 246140   Final   Implantable Lead Connection Status 01/22/2024 246014   Final  Clinical Support on 12/17/2023  Component Date Value Ref Range  Status   Date Time Interrogation Session 12/17/2023 [id number]   Final   Pulse Generator Manufacturer 12/17/2023 SJCR   Final   Pulse Gen Model 12/17/2023 [id number] Gallant HF   Final   Pulse Gen Serial Number 12/17/2023 [id number]   Final   Clinic Name 12/17/2023 [facility]   Final   Implantable Pulse Generator Type 12/17/2023 Cardiac Resynch Therapy Defibulator   Final   Implantable Pulse Generator Implan* 12/17/2023  [id number]   Final   Implantable Lead Manufacturer 12/17/2023 [facility]   Final   Implantable Lead Model 12/17/2023 1456Q Quartet   Final   Implantable Lead Serial Number 12/17/2023 [id number]   Final   Implantable Lead Implant Date 12/17/2023 [id number]   Final   Implantable Lead Location Detail 1 12/17/2023 UNKNOWN   Final   Implantable Lead Location 12/17/2023 246141   Final   Implantable Lead Connection Status 12/17/2023 246014   Final   Implantable Lead Manufacturer 12/17/2023 [employer]   Final   Implantable Lead Model 12/17/2023 7122Q Durata SJ4   Final   Implantable Lead Serial Number 12/17/2023 [id number]   Final   Implantable Lead Implant Date 12/17/2023 [id number]   Final   Implantable Lead Location Detail 1 12/17/2023 UNKNOWN   Final   Implantable Lead Location 12/17/2023 246139   Final   Implantable Lead Connection Status 12/17/2023 246014   Final   Implantable Lead Manufacturer 12/17/2023 OTHER   Final   Implantable Lead Model 12/17/2023 OEJ8768/41 ULTIPACE   Final   Implantable Lead Serial Number 12/17/2023 [id number]   Final   Implantable Lead Implant Date 12/17/2023 [id number]   Final   Implantable Lead Location Detail 1 12/17/2023 UNKNOWN   Final   Implantable Lead Location 12/17/2023 246140   Final   Implantable Lead Connection Status 12/17/2023 246014   Final   Lead Channel Setting Sensing Sensi* 12/17/2023 0.5  mV Final   Lead Channel Setting Sensing Adapt* 12/17/2023 Adaptive Sensing   Final   Lead Channel Setting Pacing Amplit* 12/17/2023 2.0  V Final   Lead  Channel Setting Pacing Pulse * 12/17/2023 0.5  ms Final   Lead Channel Setting Pacing Amplit* 12/17/2023 1.5  V Final   Lead Channel Setting Pacing Pulse * 12/17/2023 0.5  ms Final   Lead Channel Setting Pacing Amplit* 12/17/2023 2.5  V Final   Lead Channel Setting Pacing Captur* 12/17/2023 Adaptive Capture   Final   Zone Setting Status 12/17/2023 Active   Final   Zone Setting Status 12/17/2023 Inactive   Final   Zone Setting Status 12/17/2023 755011   Final   Lead Channel Status 12/17/2023 NULL   Final   Lead Channel Impedance Value 12/17/2023 890  ohm Final   Lead Channel Pacing Threshold Ampl* 12/17/2023 2.0  V Final   Lead Channel Pacing Threshold Puls* 12/17/2023 0.5  ms Final   Lead Channel Status 12/17/2023 NULL   Final   Lead Channel Impedance Value 12/17/2023 490  ohm Final   Lead Channel Sensing Intrinsic Amp* 12/17/2023 4.3  mV Final   Lead Channel Pacing Threshold Ampl* 12/17/2023 0.75  V Final   Lead Channel Pacing Threshold Puls* 12/17/2023 0.5  ms Final   Lead Channel Status 12/17/2023 NULL   Final   Lead Channel Impedance Value 12/17/2023 400  ohm Final   Lead Channel Sensing Intrinsic Amp* 12/17/2023 5.5  mV Final   Lead Channel Pacing Threshold Ampl* 12/17/2023 0.5  V Final   Lead Channel Pacing Threshold Puls* 12/17/2023 0.5  ms Final   HighPow Impedance 12/17/2023 72  ohm Final   HighPow Imped Status 12/17/2023 NULL   Final   Battery Status 12/17/2023 MOS   Final   Battery Remaining Longevity 12/17/2023 74  mo Final   Battery Remaining Percentage 12/17/2023 85.0  % Final   Battery Voltage 12/17/2023 2.96  V Final   Brady Statistic RA Percent Paced 12/17/2023 99.0  % Final   Brady Statistic AP VP Percent 12/17/2023  97.0  % Final   Brady Statistic AS VP Percent 12/17/2023 1.0  % Final   Brady Statistic AP VS Percent 12/17/2023 2.5  % Final   Brady Statistic AS VS Percent 12/17/2023 1.0  % Final  Hospital Outpatient Visit on 11/07/2023  Component Date Value Ref  Range Status   S' Lateral 11/07/2023 3.92  cm Final   Est EF 11/07/2023 40 - 45%   Final  Clinical Support on 09/17/2023  Component Date Value Ref Range Status   Date Time Interrogation Session 09/16/2023 [id number]   Final   Pulse Generator Manufacturer 09/16/2023 SJCR   Final   Pulse Gen Model 09/16/2023 [id number] Gallant HF   Final   Pulse Gen Serial Number 09/16/2023 [id number]   Final   Clinic Name 09/16/2023 [facility]   Final   Implantable Pulse Generator Type 09/16/2023 Cardiac Resynch Therapy Defibulator   Final   Implantable Pulse Generator Implan* 09/16/2023 [id number]   Final   Implantable Lead Manufacturer 09/16/2023 [facility]   Final   Implantable Lead Model 09/16/2023 1456Q Quartet   Final   Implantable Lead Serial Number 09/16/2023 [id number]   Final   Implantable Lead Implant Date 09/16/2023 [id number]   Final   Implantable Lead Location Detail 1 09/16/2023 UNKNOWN   Final   Implantable Lead Location 09/16/2023 246141   Final   Implantable Lead Connection Status 09/16/2023 246014   Final   Implantable Lead Manufacturer 09/16/2023 [facility]   Final   Implantable Lead Model 09/16/2023 7122Q Durata SJ4   Final   Implantable Lead Serial Number 09/16/2023 [id number]   Final   Implantable Lead Implant Date 09/16/2023 [id number]   Final   Implantable Lead Location Detail 1 09/16/2023 UNKNOWN   Final   Implantable Lead Location 09/16/2023 246139   Final   Implantable Lead Connection Status 09/16/2023 246014   Final   Implantable Lead Manufacturer 09/16/2023 OTHER   Final   Implantable Lead Model 09/16/2023 OEJ8768/41 ULTIPACE   Final   Implantable Lead Serial Number 09/16/2023 [id number]   Final   Implantable Lead Implant Date 09/16/2023 [id number]   Final   Implantable Lead Location Detail 1 09/16/2023 UNKNOWN   Final   Implantable Lead Location 09/16/2023 246140   Final   Implantable Lead Connection Status 09/16/2023 246014   Final   Lead Channel Setting Sensing Sensi* 09/16/2023 0.5   mV Final   Lead Channel Setting Sensing Adapt* 09/16/2023 Adaptive Sensing   Final   Lead Channel Setting Pacing Amplit* 09/16/2023 2.0  V Final   Lead Channel Setting Pacing Pulse * 09/16/2023 0.5  ms Final   Lead Channel Setting Pacing Amplit* 09/16/2023 1.5  V Final   Lead Channel Setting Pacing Pulse * 09/16/2023 0.5  ms Final   Lead Channel Setting Pacing Amplit* 09/16/2023 2.25  V Final   Lead Channel Setting Pacing Captur* 09/16/2023 Adaptive Capture   Final   Zone Setting Status 09/16/2023 Active   Final   Zone Setting Status 09/16/2023 Inactive   Final   Zone Setting Status 09/16/2023 755011   Final   Lead Channel Status 09/16/2023 NULL   Final   Lead Channel Impedance Value 09/16/2023 910  ohm Final   Lead Channel Pacing Threshold Ampl* 09/16/2023 1.75  V Final   Lead Channel Pacing Threshold Puls* 09/16/2023 0.5  ms Final   Lead Channel Status 09/16/2023 NULL   Final   Lead Channel Impedance Value 09/16/2023 490  ohm Final   Lead Channel Sensing Intrinsic Amp* 09/16/2023 4.3  mV Final   Lead Channel Pacing Threshold Ampl* 09/16/2023 0.75  V Final   Lead Channel Pacing Threshold Puls* 09/16/2023 0.5  ms Final   Lead Channel Status 09/16/2023 NULL   Final   Lead Channel Impedance Value 09/16/2023 410  ohm Final   Lead Channel Sensing Intrinsic Amp* 09/16/2023 11.4  mV Final   Lead Channel Pacing Threshold Ampl* 09/16/2023 0.5  V Final   Lead Channel Pacing Threshold Puls* 09/16/2023 0.5  ms Final   HighPow Impedance 09/16/2023 78  ohm Final   HighPow Imped Status 09/16/2023 NULL   Final   Battery Status 09/16/2023 MOS   Final   Battery Remaining Longevity 09/16/2023 77  mo Final   Battery Remaining Percentage 09/16/2023 89.0  % Final   Battery Voltage 09/16/2023 2.96  V Final   Brady Statistic RA Percent Paced 09/16/2023 99.0  % Final   Brady Statistic AP VP Percent 09/16/2023 97.0  % Final   Brady Statistic AS VP Percent 09/16/2023 1.0  % Final   Brady Statistic AP VS  Percent 09/16/2023 2.8  % Final   Brady Statistic AS VS Percent 09/16/2023 1.0  % Final  Office Visit on 08/30/2023  Component Date Value Ref Range Status   Immunoglobulin A 08/30/2023 150  70 - 320 mg/dL Final   IgG (Immunoglobin G), Serum 08/30/2023 520 (L)  600 - 1,540 mg/dL Final   IgM, Serum 94/98/7974 47 (L)  50 - 300 mg/dL Final   WBC 94/98/7974 5.7  4.0 - 10.5 K/uL Final   RBC 08/30/2023 4.57  4.22 - 5.81 Mil/uL Final   Hemoglobin 08/30/2023 15.5  13.0 - 17.0 g/dL Final   HCT 94/98/7974 46.7  39.0 - 52.0 % Final   MCV 08/30/2023 102.1 (H)  78.0 - 100.0 fl Final   MCHC 08/30/2023 33.2  30.0 - 36.0 g/dL Final   RDW 94/98/7974 14.2  11.5 - 15.5 % Final   Platelets 08/30/2023 190.0  150.0 - 400.0 K/uL Final   Neutrophils Relative % 08/30/2023 59.6  43.0 - 77.0 % Final   Lymphocytes Relative 08/30/2023 31.4  12.0 - 46.0 % Final   Monocytes Relative 08/30/2023 7.1  3.0 - 12.0 % Final   Eosinophils Relative 08/30/2023 1.3  0.0 - 5.0 % Final   Basophils Relative 08/30/2023 0.6  0.0 - 3.0 % Final   Neutro Abs 08/30/2023 3.4  1.4 - 7.7 K/uL Final   Lymphs Abs 08/30/2023 1.8  0.7 - 4.0 K/uL Final   Monocytes Absolute 08/30/2023 0.4  0.1 - 1.0 K/uL Final   Eosinophils Absolute 08/30/2023 0.1  0.0 - 0.7 K/uL Final   Basophils Absolute 08/30/2023 0.0  0.0 - 0.1 K/uL Final   Sodium 08/30/2023 141  135 - 145 mEq/L Final   Potassium 08/30/2023 4.1  3.5 - 5.1 mEq/L Final   Chloride 08/30/2023 103  96 - 112 mEq/L Final   CO2 08/30/2023 32  19 - 32 mEq/L Final   Glucose, Bld 08/30/2023 79  70 - 99 mg/dL Final   BUN 94/98/7974 13  6 - 23 mg/dL Final   Creatinine, Ser 08/30/2023 0.99  0.40 - 1.50 mg/dL Final   Total Bilirubin 08/30/2023 0.6  0.2 - 1.2 mg/dL Final   Alkaline Phosphatase 08/30/2023 57  39 - 117 U/L Final   AST 08/30/2023 25  0 - 37 U/L Final   ALT 08/30/2023 21  0 - 53 U/L Final   Total Protein 08/30/2023 6.3  6.0 - 8.3 g/dL Final  Albumin  08/30/2023 4.1  3.5 - 5.2 g/dL Final    GFR 94/98/7974 75.32  >60.00 mL/min Final   Calcium  08/30/2023 9.4  8.4 - 10.5 mg/dL Final   Cholesterol 94/98/7974 86  0 - 200 mg/dL Final   Triglycerides 94/98/7974 87.0  0.0 - 149.0 mg/dL Final   HDL 94/98/7974 54.60  >39.00 mg/dL Final   VLDL 94/98/7974 17.4  0.0 - 40.0 mg/dL Final   LDL Cholesterol 08/30/2023 14  0 - 99 mg/dL Final   Total CHOL/HDL Ratio 08/30/2023 2   Final   NonHDL 08/30/2023 31.19   Final   Hgb A1c MFr Bld 08/30/2023 5.3  4.6 - 6.5 % Final   VITD 08/30/2023 45.44  30.00 - 100.00 ng/mL Final   Uric Acid, Serum 08/30/2023 3.2 (L)  4.0 - 7.8 mg/dL Final  Office Visit on 07/17/2023  Component Date Value Ref Range Status   Date Time Interrogation Session 07/17/2023 [id number]   Final   Pulse Generator Manufacturer 07/17/2023 SJCR   Final   Pulse Gen Model 07/17/2023 [id number] Gallant HF   Final   Pulse Gen Serial Number 07/17/2023 [id number]   Final   Clinic Name 07/17/2023 [facility]   Final   Implantable Pulse Generator Type 07/17/2023 Cardiac Resynch Therapy Defibulator   Final   Implantable Pulse Generator Implan* 07/17/2023 [id number]   Final   Implantable Lead Manufacturer 07/17/2023 [facility]   Final   Implantable Lead Model 07/17/2023 1456Q Quartet   Final   Implantable Lead Serial Number 07/17/2023 [id number]   Final   Implantable Lead Implant Date 07/17/2023 [id number]   Final   Implantable Lead Location Detail 1 07/17/2023 UNKNOWN   Final   Implantable Lead Location 07/17/2023 246141   Final   Implantable Lead Connection Status 07/17/2023 246014   Final   Implantable Lead Manufacturer 07/17/2023 [facility]   Final   Implantable Lead Model 07/17/2023 7122Q Durata SJ4   Final   Implantable Lead Serial Number 07/17/2023 [id number]   Final   Implantable Lead Implant Date 07/17/2023 [id number]   Final   Implantable Lead Location Detail 1 07/17/2023 UNKNOWN   Final   Implantable Lead Location 07/17/2023 246139   Final   Implantable Lead Connection Status  07/17/2023 246014   Final   Implantable Lead Manufacturer 07/17/2023 OTHER   Final   Implantable Lead Model 07/17/2023 [id number]/41 ULTIPACE   Final   Implantable Lead Serial Number 07/17/2023 [id number]   Final   Implantable Lead Implant Date 07/17/2023 [id number]   Final   Implantable Lead Location Detail 1 07/17/2023 UNKNOWN   Final   Implantable Lead Location 07/17/2023 246140   Final   Implantable Lead Connection Status 07/17/2023 246014   Final   Lead Channel Setting Sensing Sensi* 07/17/2023 0.5  mV Final   Lead Channel Setting Pacing Amplit* 07/17/2023 2.0  V Final   Lead Channel Setting Pacing Pulse * 07/17/2023 0.5  ms Final   Lead Channel Setting Pacing Amplit* 07/17/2023 1.375   Final   Lead Channel Setting Pacing Pulse * 07/17/2023 0.5  ms Final   Lead Channel Setting Pacing Amplit* 07/17/2023 2.25  V Final   Lead Channel Setting Pacing Captur* 07/17/2023 Adaptive Capture   Final   Zone Setting Status 07/17/2023 Active   Final   Zone Setting Status 07/17/2023 Inactive   Final   Zone Setting Status 07/17/2023 244988   Final   Lead Channel Impedance Value 07/17/2023 525.0  ohm Final   Lead Channel Sensing Intrinsic Amp* 07/17/2023 4.0  mV Final   Lead Channel Pacing Threshold Ampl* 07/17/2023 0.75  V Final   Lead Channel Pacing Threshold Puls* 07/17/2023 0.5  ms Final   Lead Channel Pacing Threshold Ampl* 07/17/2023 0.75  V Final   Lead Channel Pacing Threshold Puls* 07/17/2023 0.5  ms Final   Lead Channel Impedance Value 07/17/2023 412.5  ohm Final   Lead Channel Sensing Intrinsic Amp* 07/17/2023 5.5  mV Final   Lead Channel Pacing Threshold Ampl* 07/17/2023 0.375  V Final   Lead Channel Pacing Threshold Puls* 07/17/2023 0.5  ms Final   HighPow Impedance 07/17/2023 72.0  ohm Final   Lead Channel Impedance Value 07/17/2023 887.5  ohm Final   Lead Channel Pacing Threshold Ampl* 07/17/2023 1.75  V Final   Lead Channel Pacing Threshold Puls* 07/17/2023 0.5  ms Final   Lead Channel  Pacing Threshold Ampl* 07/17/2023 1.75  V Final   Lead Channel Pacing Threshold Puls* 07/17/2023 0.5  ms Final   Battery Remaining Longevity 07/17/2023 78  mo Final   [facility] Statistic RA Percent Paced 07/17/2023 99.0  % Final   Brady Statistic RV Percent Paced 07/17/2023 97.0  % Final  Office Visit on 06/26/2023  Component Date Value Ref Range Status   Total Protein 06/26/2023 6.4  6.0 - 8.5 g/dL Final   Albumin  06/26/2023 4.3  3.8 - 4.8 g/dL Final   Bilirubin Total 06/26/2023 0.8  0.0 - 1.2 mg/dL Final   Bilirubin, Direct 06/26/2023 0.34  0.00 - 0.40 mg/dL Final   Alkaline Phosphatase 06/26/2023 66  44 - 121 IU/L Final   AST 06/26/2023 30  0 - 40 IU/L Final   ALT 06/26/2023 23  0 - 44 IU/L Final   TSH 06/26/2023 2.580  0.450 - 4.500 uIU/mL Final   Date Time Interrogation Session 06/26/2023 [id number]   Final   Pulse Generator Manufacturer 06/26/2023 SJCR   Final   Pulse Gen Model 06/26/2023 [id number] Gallant HF   Final   Pulse Gen Serial Number 06/26/2023 [id number]   Final   Clinic Name 06/26/2023 [facility]   Final   Implantable Pulse Generator Type 06/26/2023 Cardiac Resynch Therapy Defibulator   Final   Implantable Pulse Generator Implan* 06/26/2023 [id number]   Final   Implantable Lead Manufacturer 06/26/2023 [facility]   Final   Implantable Lead Model 06/26/2023 1456Q Quartet   Final   Implantable Lead Serial Number 06/26/2023 [id number]   Final   Implantable Lead Implant Date 06/26/2023 [id number]   Final   Implantable Lead Location Detail 1 06/26/2023 UNKNOWN   Final   Implantable Lead Location 06/26/2023 246141   Final   Implantable Lead Connection Status 06/26/2023 246014   Final   Implantable Lead Manufacturer 06/26/2023 [facility]   Final   Implantable Lead Model 06/26/2023 7122Q Durata SJ4   Final   Implantable Lead Serial Number 06/26/2023 [id number]   Final   Implantable Lead Implant Date 06/26/2023 [id number]   Final   Implantable Lead Location Detail 1 06/26/2023 UNKNOWN    Final   Implantable Lead Location 06/26/2023 246139   Final   Implantable Lead Connection Status 06/26/2023 246014   Final   Implantable Lead Manufacturer 06/26/2023 OTHER   Final   Implantable Lead Model 06/26/2023 [id number]/41 ULTIPACE   Final   Implantable Lead Serial Number 06/26/2023 [id number]   Final   Implantable Lead Implant Date 06/26/2023 [id number]   Final   Implantable Lead Location Detail 1 06/26/2023 UNKNOWN   Final   Implantable Lead Location 06/26/2023 246140  Final   Implantable Lead Connection Status 06/26/2023 246014   Final   Lead Channel Setting Sensing Sensi* 06/26/2023 0.5  mV Final   Lead Channel Setting Pacing Amplit* 06/26/2023 2.5  V Final   Lead Channel Setting Pacing Pulse * 06/26/2023 0.5  ms Final   Lead Channel Setting Pacing Amplit* 06/26/2023 1.5  V Final   Lead Channel Setting Pacing Pulse * 06/26/2023 0.5  ms Final   Lead Channel Setting Pacing Amplit* 06/26/2023 2.0  V Final   Lead Channel Setting Pacing Captur* 06/26/2023 Fixed Pacing   Final   Zone Setting Status 06/26/2023 Active   Final   Zone Setting Status 06/26/2023 Inactive   Final   Zone Setting Status 06/26/2023 244988   Final   Lead Channel Sensing Intrinsic Amp* 06/26/2023 3.6  mV Final   Lead Channel Sensing Intrinsic Amp* 06/26/2023 10.0  mV Final   Lead Channel Pacing Threshold Ampl* 06/26/2023 0.5  V Final   Lead Channel Pacing Threshold Puls* 06/26/2023 0.5  ms Final   Lead Channel Impedance Value 06/26/2023 787.5  ohm Final   Lead Channel Pacing Threshold Ampl* 06/26/2023 1.5  V Final   Lead Channel Pacing Threshold Puls* 06/26/2023 0.5  ms Final   Lead Channel Pacing Threshold Ampl* 06/26/2023 1.5  V Final   Lead Channel Pacing Threshold Puls* 06/26/2023 0.5  ms Final   Battery Remaining Longevity 06/26/2023 80  mo Final   [facility] Statistic RA Percent Paced 06/26/2023 95.0  % Final   Brady Statistic RV Percent Paced 06/26/2023 97.0  % Final  Clinical Support on 06/18/2023  Component  Date Value Ref Range Status   Date Time Interrogation Session 06/19/2023 [id number]   Final   Pulse Generator Manufacturer 06/19/2023 SJCR   Final   Pulse Gen Model 06/19/2023 [id number] Gallant HF   Final   Pulse Gen Serial Number 06/19/2023 [id number]   Final   Clinic Name 06/19/2023 [facility]   Final   Implantable Pulse Generator Type 06/19/2023 Cardiac Resynch Therapy Defibulator   Final   Implantable Pulse Generator Implan* 06/19/2023 [id number]   Final   Implantable Lead Manufacturer 06/19/2023 [facility]   Final   Implantable Lead Model 06/19/2023 1456Q Quartet   Final   Implantable Lead Serial Number 06/19/2023 [id number]   Final   Implantable Lead Implant Date 06/19/2023 [id number]   Final   Implantable Lead Location Detail 1 06/19/2023 UNKNOWN   Final   Implantable Lead Location 06/19/2023 246141   Final   Implantable Lead Connection Status 06/19/2023 246014   Final   Implantable Lead Manufacturer 06/19/2023 [facility]   Final   Implantable Lead Model 06/19/2023 7122Q Durata SJ4   Final   Implantable Lead Serial Number 06/19/2023 [id number]   Final   Implantable Lead Implant Date 06/19/2023 [id number]   Final   Implantable Lead Location Detail 1 06/19/2023 UNKNOWN   Final   Implantable Lead Location 06/19/2023 246139   Final   Implantable Lead Connection Status 06/19/2023 246014   Final   Implantable Lead Manufacturer 06/19/2023 OTHER   Final   Implantable Lead Model 06/19/2023 [id number]/41 ULTIPACE   Final   Implantable Lead Serial Number 06/19/2023 [id number]   Final   Implantable Lead Implant Date 06/19/2023 [id number]   Final   Implantable Lead Location Detail 1 06/19/2023 UNKNOWN   Final   Implantable Lead Location 06/19/2023 246140   Final   Implantable Lead Connection Status 06/19/2023 246014   Final   Lead Channel Setting Sensing Sensi* 06/19/2023 0.5  mV Final   Lead Channel Setting Sensing Adapt* 06/19/2023 Adaptive Sensing   Final   Lead Channel Setting Pacing Amplit* 06/19/2023 2.5   V Final   Lead Channel Setting Pacing Pulse * 06/19/2023 0.5  ms Final   Lead Channel Setting Pacing Amplit* 06/19/2023 1.5  V Final   Lead Channel Setting Pacing Pulse * 06/19/2023 0.5  ms Final   Lead Channel Setting Pacing Amplit* 06/19/2023 1.5  V Final   Lead Channel Setting Pacing Captur* 06/19/2023 Fixed Pacing   Final   Zone Setting Status 06/19/2023 Active   Final   Zone Setting Status 06/19/2023 Inactive   Final   Zone Setting Status 06/19/2023 755011   Final   Lead Channel Status 06/19/2023 NULL   Final   Lead Channel Impedance Value 06/19/2023 910  ohm Final   Lead Channel Pacing Threshold Ampl* 06/19/2023 1.0  V Final   Lead Channel Pacing Threshold Puls* 06/19/2023 1.0  ms Final   Lead Channel Status 06/19/2023 NULL   Final   Lead Channel Impedance Value 06/19/2023 540  ohm Final   Lead Channel Sensing Intrinsic Amp* 06/19/2023 4.2  mV Final   Lead Channel Pacing Threshold Ampl* 06/19/2023 0.5  V Final   Lead Channel Pacing Threshold Puls* 06/19/2023 0.5  ms Final   Lead Channel Status 06/19/2023 NULL   Final   Lead Channel Impedance Value 06/19/2023 450  ohm Final   Lead Channel Sensing Intrinsic Amp* 06/19/2023 12.0  mV Final   Lead Channel Pacing Threshold Ampl* 06/19/2023 0.5  V Final   Lead Channel Pacing Threshold Puls* 06/19/2023 0.5  ms Final   HighPow Impedance 06/19/2023 74  ohm Final   HighPow Imped Status 06/19/2023 NULL   Final   Battery Status 06/19/2023 MOS   Final   Battery Remaining Longevity 06/19/2023 85  mo Final   Battery Remaining Percentage 06/19/2023 92.0  % Final   Battery Voltage 06/19/2023 2.96  V Final   Brady Statistic RA Percent Paced 06/19/2023 94.0  % Final   Brady Statistic AP VP Percent 06/19/2023 95.0  % Final   Brady Statistic AS VP Percent 06/19/2023 1.0  % Final   Brady Statistic AP VS Percent 06/19/2023 2.3  % Final   Brady Statistic AS VS Percent 06/19/2023 1.0  % Final  Office Visit on 05/03/2023  Component Date Value Ref  Range Status   Glucose 05/03/2023 77  70 - 99 mg/dL Final   BUN 98/97/7974 12  8 - 27 mg/dL Final   Creatinine, Ser 05/03/2023 0.97  0.76 - 1.27 mg/dL Final   eGFR 98/97/7974 82  >59 mL/min/1.73 Final   BUN/Creatinine Ratio 05/03/2023 12  10 - 24 Final   Sodium 05/03/2023 144  134 - 144 mmol/L Final   Potassium 05/03/2023 4.4  3.5 - 5.2 mmol/L Final   Chloride 05/03/2023 104  96 - 106 mmol/L Final   CO2 05/03/2023 25  20 - 29 mmol/L Final   Calcium  05/03/2023 10.1  8.6 - 10.2 mg/dL Final   WBC 98/97/7974 6.9  3.4 - 10.8 x10E3/uL Final   RBC 05/03/2023 4.63  4.14 - 5.80 x10E6/uL Final   Hemoglobin 05/03/2023 15.6  13.0 - 17.7 g/dL Final   Hematocrit 98/97/7974 46.8  37.5 - 51.0 % Final   MCV 05/03/2023 101 (H)  79 - 97 fL Final   MCH 05/03/2023 33.7 (H)  26.6 - 33.0 pg Final   MCHC 05/03/2023 33.3  31.5 - 35.7 g/dL Final  RDW 05/03/2023 13.0  11.6 - 15.4 % Final   Platelets 05/03/2023 170  150 - 450 x10E3/uL Final   Neutrophils 05/03/2023 64  Not Estab. % Final   Lymphs 05/03/2023 26  Not Estab. % Final   Monocytes 05/03/2023 8  Not Estab. % Final   Eos 05/03/2023 1  Not Estab. % Final   Basos 05/03/2023 1  Not Estab. % Final   Neutrophils Absolute 05/03/2023 4.4  1.4 - 7.0 x10E3/uL Final   Lymphocytes Absolute 05/03/2023 1.8  0.7 - 3.1 x10E3/uL Final   Monocytes Absolute 05/03/2023 0.5  0.1 - 0.9 x10E3/uL Final   EOS (ABSOLUTE) 05/03/2023 0.1  0.0 - 0.4 x10E3/uL Final   Basophils Absolute 05/03/2023 0.0  0.0 - 0.2 x10E3/uL Final   Immature Granulocytes 05/03/2023 0  Not Estab. % Final   Immature Grans (Abs) 05/03/2023 0.0  0.0 - 0.1 x10E3/uL Final  There may be more visits with results that are not included.  No image results found. CT ABDOMEN PELVIS W CONTRAST Result Date: 02/04/2024 CLINICAL DATA:  Suspected ventral hernia. EXAM: CT ABDOMEN AND PELVIS WITH CONTRAST TECHNIQUE: Multidetector CT imaging of the abdomen and pelvis was performed using the standard protocol following  bolus administration of intravenous contrast. RADIATION DOSE REDUCTION: This exam was performed according to the departmental dose-optimization program which includes automated exposure control, adjustment of the mA and/or kV according to patient size and/or use of iterative reconstruction technique. CONTRAST:  100mL ISOVUE-300 IOPAMIDOL (ISOVUE-300) INJECTION 61% COMPARISON:  12/06/2022 FINDINGS: Lower Chest: No acute findings. Hepatobiliary: No suspicious hepatic masses identified. Gallbladder is unremarkable. No evidence of biliary ductal dilatation. Pancreas:  No mass or inflammatory changes. Spleen: Within normal limits in size and appearance. Adrenals/Urinary Tract: Multiple benign-appearing renal cysts are again seen bilaterally (No followup imaging is recommended) . No suspicious masses identified. No evidence of ureteral calculi or hydronephrosis. Unremarkable unopacified urinary bladder. Stomach/Bowel: Prior sleeve gastrectomy again noted. No evidence of obstruction, inflammatory process or abnormal fluid collections. Normal appendix visualized. Diverticulosis is seen mainly involving the descending and sigmoid colon, however there is no evidence of diverticulitis. Vascular/Lymphatic: No pathologically enlarged lymph nodes. No acute vascular findings. Reproductive:  No mass or other significant abnormality. Other:  No evidence of ventral abdominal wall or inguinal hernia. Musculoskeletal: No suspicious bone lesions identified. Right hip prosthesis noted. IMPRESSION: No evidence of ventral abdominal wall hernia or other acute findings. Colonic diverticulosis, without radiographic evidence of diverticulitis. Electronically Signed   By: Norleen DELENA Kil M.D.   On: 02/04/2024 14:19   CUP PACEART INCLINIC DEVICE CHECK Result Date: 01/22/2024 Normal in-clinic CRT-D (multi-lead) check. Presenting Rhythm: AP / BiV. Routine testing was performed. Thresholds, sensing, impedance trend were stable and no changes were  required. HF diagnostics are stable. No treated arrhythmias. Patient BiV pacing 97% of the time. Estimated longevity 5.8-6.1 years. 1 episode of magnet response but pt does not remember exposure. Pt enrolled in remote follow-up. bo  US  PELVIS LIMITED (TRANSABDOMINAL ONLY) Result Date: 01/15/2024 CLINICAL DATA:  Right groin pain for several weeks. EXAM: LIMITED ULTRASOUND OF PELVIS TECHNIQUE: Limited transabdominal ultrasound examination of the pelvis was performed. COMPARISON:  December 06, 2022. FINDINGS: Limited sonographic evaluation was performed in the right inguinal region. No sonographic evidence of mass, cyst, fluid collection or hernia is noted. IMPRESSION: No definite sonographic abnormality seen in the right inguinal region. Electronically Signed   By: Lynwood Landy Raddle M.D.   On: 01/15/2024 15:50   CUP PACEART REMOTE DEVICE CHECK Result  Date: 12/19/2023 BIV ICD scheduled remote reviewed. Normal device function.  Presenting rhythm:AP-BVP Next remote 91 days. AB, CVRS        ASSESSMENT & PLAN   Assessment & Plan Macrocytosis Macrocytosis (large red blood cells)   Macrocytosis has been present for about a year. Differential diagnosis includes vitamin B12 deficiency, thyroid  problems, and cancer, with alcohol use potentially contributing by affecting bone marrow function. There is no current evidence of thyroid  enlargement or cancer. Blood tests for vitamin B12, folate, methylmalonic acid, and thyroid  function have been ordered. He is advised to continue abstaining from alcohol. 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 Miralax is safe for daily use to prevent straining, especially post-hernia repair. He is advised to use Miralax daily, 17 grams per day, to achieve watery stools and prevent straining. Increased fluid intake and dietary  modifications, including extra virgin olive oil and roasted cauliflower for fiber, are encouraged. Vitamin D  deficiency Shared decision-making done; patient understood rationale and agreed to [facility]  Idiopathic gout of left foot, unspecified chronicity Idiopathic gout, left ankle and foot   Gout symptoms last occurred in the finger. Weight loss with Wegovy  may have reduced gout risk. Uric acid levels will be monitored to assess current risk. A uric acid level test has been ordered, and he is advised to avoid shellfish and high-purine foods. Atrial fibrillation, persistent Check mg and TSH and coordinate with Dr. 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 Coordination with a cardiologist for ongoing management will continue. He should continue taking amiodarone . Calcium  oxalate kidney stones follow-up with a urologist is scheduled. He should continue follow-up with the urologist. Shared decision-making done; patient understood rationale and agreed to labwork urine Hypogammaglobulinemia Suspicious for amiodarone  induced Discussed possible need to change it- will recheck severity. Gout of hand, unspecified cause, unspecified chronicity, unspecified laterality Not flared in long time Shared decision-making done; patient understood rationale and agreed to [facility] Maintenance   Routine health maintenance includes dietary recommendations and exercise. He is encouraged to use extra virgin olive oil in his diet and recommended roasted cauliflower as a healthy side dish. Resistance exercises such as squats and weighted vest squats are advised.  ORDER ASSOCIATIONS  #   DIAGNOSIS / CONDITION ICD-10 ENCOUNTER ORDER     ICD-10-CM   1. Macrocytosis  D75.89 CBC with  Differential/Platelet    Comprehensive metabolic panel with GFR    TSH + free T4    B12 and Folate Panel    Methylmalonic Acid    Pathologist smear review    Retic    2. Chronic idiopathic constipation  K59.04     3. Vitamin D  deficiency  E55.9 Vitamin D  (25 hydroxy)    4. Idiopathic gout of left foot, unspecified chronicity  M10.072     5. Atrial fibrillation, persistent  I48.19 Magnesium    IgG, IgA, IgM    6. Chronic HFrEF (heart failure with reduced ejection fraction) (HCC)  I50.22 Magnesium    IgG, IgA, IgM    Lipid panel    7. Calcium  oxalate kidney stones  N20.0 Urine Microalbumin w/creat. ratio    Urinalysis w microscopic + reflex cultur    8. 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 Gout of hand, unspecified cause, unspecified chronicity, unspecified laterality  M10.9 Uric acid      =     Orders Placed in Encounter:   Lab Orders         CBC with Differential/Platelet         Comprehensive metabolic panel with GFR         TSH + free T4         B12 and Folate Panel         Methylmalonic Acid         Pathologist smear review         Retic         Vitamin D  (25 hydroxy)         Magnesium         IgG, IgA, IgM         Uric acid         Lipid panel         Urine Microalbumin w/creat. ratio         Urinalysis w microscopic + reflex cultur      This document was synthesized by artificial intelligence (Abridge) using HIPAA-compliant recording of the clinical interaction;   We discussed the use of AI scribe software for clinical note transcription with the patient, who gave verbal consent to proceed. additional Info: This encounter employed state-of-the-art, real-time, collaborative documentation. The patient actively reviewed and assisted in updating their electronic medical record on a shared screen, ensuring transparency and facilitating joint problem-solving for the problem list, overview, and plan. This approach promotes accurate, informed care. The  treatment plan was discussed and reviewed in detail, including medication safety, potential side effects, and all patient questions. We confirmed understanding and comfort with the plan. Follow-up instructions were established, including contacting the office for any concerns, returning if symptoms worsen, persist, or new symptoms develop, and precautions for potential emergency department visits.

## 2024-03-04 NOTE — Assessment & Plan Note (Signed)
 Not flared in long time Shared decision-making done; patient understood rationale and agreed to [facility]

## 2024-03-05 LAB — URINALYSIS W MICROSCOPIC + REFLEX CULTURE
Bacteria, UA: NONE SEEN /HPF
Bilirubin Urine: NEGATIVE
Hgb urine dipstick: NEGATIVE
Hyaline Cast: NONE SEEN /LPF
Ketones, ur: NEGATIVE
Leukocyte Esterase: NEGATIVE
Nitrites, Initial: NEGATIVE
Protein, ur: NEGATIVE
RBC / HPF: NONE SEEN /HPF (ref 0–2)
Specific Gravity, Urine: 1.025 (ref 1.001–1.035)
Squamous Epithelial / HPF: NONE SEEN /HPF (ref <=5)
WBC, UA: NONE SEEN /HPF (ref 0–5)
pH: 7.5 (ref 5.0–8.0)

## 2024-03-05 LAB — VITAMIN D 25 HYDROXY (VIT D DEFICIENCY, FRACTURES): VITD: 65.91 ng/mL (ref 30.00–100.00)

## 2024-03-05 LAB — NO CULTURE INDICATED

## 2024-03-05 LAB — IGG, IGA, IGM
IgG (Immunoglobin G), Serum: 494 mg/dL — ABNORMAL LOW (ref 600–1540)
IgM, Serum: 39 mg/dL — ABNORMAL LOW (ref 50–300)
Immunoglobulin A: 174 mg/dL (ref 70–320)

## 2024-03-06 ENCOUNTER — Other Ambulatory Visit: Payer: Self-pay | Admitting: Internal Medicine

## 2024-03-06 DIAGNOSIS — E7849 Other hyperlipidemia: Secondary | ICD-10-CM

## 2024-03-06 DIAGNOSIS — I429 Cardiomyopathy, unspecified: Secondary | ICD-10-CM

## 2024-03-09 ENCOUNTER — Ambulatory Visit: Payer: Self-pay | Admitting: Internal Medicine

## 2024-03-09 LAB — COMPREHENSIVE METABOLIC PANEL WITH GFR
ALT: 39 IU/L (ref 0–44)
AST: 44 IU/L — ABNORMAL HIGH (ref 0–40)
Albumin: 4.2 g/dL (ref 3.8–4.8)
Alkaline Phosphatase: 86 IU/L (ref 47–123)
BUN/Creatinine Ratio: 13 (ref 10–24)
BUN: 13 mg/dL (ref 8–27)
Bilirubin Total: 0.4 mg/dL (ref 0.0–1.2)
CO2: 26 mmol/L (ref 20–29)
Calcium: 9.8 mg/dL (ref 8.6–10.2)
Chloride: 103 mmol/L (ref 96–106)
Creatinine, Ser: 1.02 mg/dL (ref 0.76–1.27)
Globulin, Total: 1.6 g/dL (ref 1.5–4.5)
Glucose: 77 mg/dL (ref 70–99)
Potassium: 4.5 mmol/L (ref 3.5–5.2)
Sodium: 143 mmol/L (ref 134–144)
Total Protein: 5.8 g/dL — ABNORMAL LOW (ref 6.0–8.5)
eGFR: 77 mL/min/1.73 (ref >59)

## 2024-03-09 LAB — CBC WITH DIFFERENTIAL/PLATELET
Basophils Absolute: 0.1 x10E3/uL (ref 0.0–0.2)
Basos: 1 %
EOS (ABSOLUTE): 0.1 x10E3/uL (ref 0.0–0.4)
Eos: 2 %
Hematocrit: 46 % (ref 37.5–51.0)
Hemoglobin: 14.9 g/dL (ref 13.0–17.7)
Immature Grans (Abs): 0 x10E3/uL (ref 0.0–0.1)
Immature Granulocytes: 0 %
Lymphocytes Absolute: 1.6 x10E3/uL (ref 0.7–3.1)
Lymphs: 27 %
MCH: 34.2 pg — ABNORMAL HIGH (ref 26.6–33.0)
MCHC: 32.4 g/dL (ref 31.5–35.7)
MCV: 106 fL — ABNORMAL HIGH (ref 79–97)
Monocytes Absolute: 0.4 x10E3/uL (ref 0.1–0.9)
Monocytes: 6 %
Neutrophils Absolute: 3.7 x10E3/uL (ref 1.4–7.0)
Neutrophils: 64 %
Platelets: 219 x10E3/uL (ref 150–450)
RBC: 4.36 x10E6/uL (ref 4.14–5.80)
RDW: 13.1 % (ref 11.6–15.4)
WBC: 5.8 x10E3/uL (ref 3.4–10.8)

## 2024-03-09 LAB — TSH+FREE T4
Free T4: 1.46 ng/dL (ref 0.82–1.77)
TSH: 1.17 u[IU]/mL (ref 0.450–4.500)

## 2024-03-09 LAB — RETICULOCYTES: Retic Ct Pct: 1.4 % (ref 0.6–2.6)

## 2024-03-09 LAB — B12 AND FOLATE PANEL
Folate: >20 ng/mL (ref >3.0)
Vitamin B-12: >2000 pg/mL — ABNORMAL HIGH (ref 232–1245)

## 2024-03-09 LAB — METHYLMALONIC ACID, SERUM: Methylmalonic Acid: 147 nmol/L (ref 0–378)

## 2024-03-17 ENCOUNTER — Ambulatory Visit (INDEPENDENT_AMBULATORY_CARE_PROVIDER_SITE_OTHER): Payer: Medicare Other

## 2024-03-17 DIAGNOSIS — I4819 Other persistent atrial fibrillation: Secondary | ICD-10-CM | POA: Diagnosis not present

## 2024-03-18 ENCOUNTER — Ambulatory Visit: Payer: Self-pay | Admitting: Cardiology

## 2024-03-18 LAB — CUP PACEART REMOTE DEVICE CHECK
Battery Remaining Longevity: 70 mo
Battery Remaining Percentage: 82 %
Battery Voltage: 2.96 V
Brady Statistic AP VP Percent: 98 %
Brady Statistic AP VS Percent: 2 %
Brady Statistic AS VP Percent: 1 %
Brady Statistic AS VS Percent: 1 %
Brady Statistic RA Percent Paced: 99 %
Date Time Interrogation Session: 20251117020430
HighPow Impedance: 73 Ohm
Implantable Lead Connection Status: 753985
Implantable Lead Connection Status: 753985
Implantable Lead Connection Status: 753985
Implantable Lead Implant Date: 20241115
Implantable Lead Implant Date: 20241115
Implantable Lead Implant Date: 20241115
Implantable Lead Location: 753858
Implantable Lead Location: 753859
Implantable Lead Location: 753860
Implantable Pulse Generator Implant Date: 20241115
Lead Channel Impedance Value: 400 Ohm
Lead Channel Impedance Value: 500 Ohm
Lead Channel Impedance Value: 980 Ohm
Lead Channel Pacing Threshold Amplitude: 0.5 V
Lead Channel Pacing Threshold Amplitude: 0.75 V
Lead Channel Pacing Threshold Amplitude: 2 V
Lead Channel Pacing Threshold Pulse Width: 0.5 ms
Lead Channel Pacing Threshold Pulse Width: 0.5 ms
Lead Channel Pacing Threshold Pulse Width: 0.5 ms
Lead Channel Sensing Intrinsic Amplitude: 3.4 mV
Lead Channel Sensing Intrinsic Amplitude: 5.5 mV
Lead Channel Setting Pacing Amplitude: 1.5 V
Lead Channel Setting Pacing Amplitude: 2 V
Lead Channel Setting Pacing Amplitude: 2.5 V
Lead Channel Setting Pacing Pulse Width: 0.5 ms
Lead Channel Setting Pacing Pulse Width: 0.5 ms
Lead Channel Setting Sensing Sensitivity: 0.5 mV
Pulse Gen Serial Number: 810108245
Zone Setting Status: 755011

## 2024-03-19 NOTE — Progress Notes (Signed)
 Remote ICD Transmission

## 2024-03-23 ENCOUNTER — Other Ambulatory Visit: Payer: Self-pay | Admitting: Cardiology

## 2024-04-01 ENCOUNTER — Other Ambulatory Visit: Payer: Self-pay | Admitting: Internal Medicine

## 2024-04-01 DIAGNOSIS — I429 Cardiomyopathy, unspecified: Secondary | ICD-10-CM

## 2024-04-01 DIAGNOSIS — E7849 Other hyperlipidemia: Secondary | ICD-10-CM

## 2024-04-12 ENCOUNTER — Other Ambulatory Visit: Payer: Self-pay | Admitting: Internal Medicine

## 2024-04-12 DIAGNOSIS — F325 Major depressive disorder, single episode, in full remission: Secondary | ICD-10-CM

## 2024-04-30 ENCOUNTER — Other Ambulatory Visit: Payer: Self-pay | Admitting: Internal Medicine

## 2024-04-30 DIAGNOSIS — I429 Cardiomyopathy, unspecified: Secondary | ICD-10-CM

## 2024-04-30 DIAGNOSIS — E7849 Other hyperlipidemia: Secondary | ICD-10-CM

## 2024-05-01 ENCOUNTER — Other Ambulatory Visit: Payer: Self-pay | Admitting: Internal Medicine

## 2024-05-01 DIAGNOSIS — I110 Hypertensive heart disease with heart failure: Secondary | ICD-10-CM

## 2024-05-01 DIAGNOSIS — I2510 Atherosclerotic heart disease of native coronary artery without angina pectoris: Secondary | ICD-10-CM

## 2024-05-01 DIAGNOSIS — R638 Other symptoms and signs concerning food and fluid intake: Secondary | ICD-10-CM

## 2024-05-01 DIAGNOSIS — I5189 Other ill-defined heart diseases: Secondary | ICD-10-CM

## 2024-05-01 DIAGNOSIS — I5022 Chronic systolic (congestive) heart failure: Secondary | ICD-10-CM

## 2024-05-03 ENCOUNTER — Other Ambulatory Visit: Payer: Self-pay | Admitting: Cardiovascular Disease

## 2024-06-03 ENCOUNTER — Other Ambulatory Visit: Payer: Self-pay | Admitting: Adult Health

## 2024-09-01 ENCOUNTER — Ambulatory Visit: Admitting: Internal Medicine
